# Patient Record
Sex: Male | Born: 1945
Health system: Southern US, Community
[De-identification: ages and names within clinical notes are randomized; demographics above are authoritative.]

## PROBLEM LIST (undated history)

## (undated) DIAGNOSIS — N189 Chronic kidney disease, unspecified: Secondary | ICD-10-CM

## (undated) DIAGNOSIS — I255 Ischemic cardiomyopathy: Secondary | ICD-10-CM

## (undated) DIAGNOSIS — E871 Hypo-osmolality and hyponatremia: Secondary | ICD-10-CM

## (undated) DIAGNOSIS — T7840XA Allergy, unspecified, initial encounter: Secondary | ICD-10-CM

## (undated) DIAGNOSIS — K219 Gastro-esophageal reflux disease without esophagitis: Secondary | ICD-10-CM

## (undated) DIAGNOSIS — G40909 Epilepsy, unspecified, not intractable, without status epilepticus: Secondary | ICD-10-CM

## (undated) DIAGNOSIS — Z800 Family history of malignant neoplasm of digestive organs: Secondary | ICD-10-CM

## (undated) DIAGNOSIS — I472 Ventricular tachycardia: Secondary | ICD-10-CM

## (undated) DIAGNOSIS — N289 Disorder of kidney and ureter, unspecified: Secondary | ICD-10-CM

## (undated) DIAGNOSIS — J703 Chronic drug-induced interstitial lung disorders: Secondary | ICD-10-CM

## (undated) DIAGNOSIS — Z95810 Presence of automatic (implantable) cardiac defibrillator: Secondary | ICD-10-CM

## (undated) DIAGNOSIS — I509 Heart failure, unspecified: Secondary | ICD-10-CM

## (undated) DIAGNOSIS — T82198A Other mechanical complication of other cardiac electronic device, initial encounter: Secondary | ICD-10-CM

## (undated) DIAGNOSIS — R51 Headache: Secondary | ICD-10-CM

## (undated) DIAGNOSIS — J449 Chronic obstructive pulmonary disease, unspecified: Secondary | ICD-10-CM

## (undated) DIAGNOSIS — I10 Essential (primary) hypertension: Secondary | ICD-10-CM

## (undated) DIAGNOSIS — I4892 Unspecified atrial flutter: Secondary | ICD-10-CM

## (undated) DIAGNOSIS — K760 Fatty (change of) liver, not elsewhere classified: Secondary | ICD-10-CM

## (undated) DIAGNOSIS — I447 Left bundle-branch block, unspecified: Secondary | ICD-10-CM

## (undated) DIAGNOSIS — E785 Hyperlipidemia, unspecified: Secondary | ICD-10-CM

## (undated) DIAGNOSIS — I2510 Atherosclerotic heart disease of native coronary artery without angina pectoris: Secondary | ICD-10-CM

## (undated) DIAGNOSIS — I4729 Other ventricular tachycardia: Secondary | ICD-10-CM

## (undated) DIAGNOSIS — J45909 Unspecified asthma, uncomplicated: Secondary | ICD-10-CM

## (undated) HISTORY — DX: Fatty (change of) liver, not elsewhere classified: K76.0

## (undated) HISTORY — PX: CORONARY ANGIOPLASTY: SHX604

## (undated) HISTORY — DX: Disorder of kidney and ureter, unspecified: N28.9

## (undated) HISTORY — DX: Allergy, unspecified, initial encounter: T78.40XA

## (undated) HISTORY — DX: Epilepsy, unspecified, not intractable, without status epilepticus: G40.909

## (undated) HISTORY — DX: Left bundle-branch block, unspecified: I44.7

## (undated) HISTORY — PX: INSERT / REPLACE / REMOVE PACEMAKER: SUR710

## (undated) HISTORY — DX: Unspecified atrial flutter: I48.92

## (undated) HISTORY — DX: Chronic drug-induced interstitial lung disorders: J70.3

## (undated) HISTORY — DX: Family history of malignant neoplasm of digestive organs: Z80.0

## (undated) HISTORY — DX: Essential (primary) hypertension: I10

## (undated) HISTORY — PX: HERNIA REPAIR: SHX51

## (undated) HISTORY — DX: Hyperlipidemia, unspecified: E78.5

## (undated) HISTORY — DX: Other mechanical complication of other cardiac electronic device, initial encounter: T82.198A

## (undated) HISTORY — DX: Presence of automatic (implantable) cardiac defibrillator: Z95.810

## (undated) HISTORY — DX: Ventricular tachycardia: I47.2

## (undated) HISTORY — DX: Ischemic cardiomyopathy: I25.5

## (undated) HISTORY — DX: Other ventricular tachycardia: I47.29

## (undated) HISTORY — DX: Hypo-osmolality and hyponatremia: E87.1

## (undated) HISTORY — DX: Chronic kidney disease, unspecified: N18.9

## (undated) HISTORY — DX: Atherosclerotic heart disease of native coronary artery without angina pectoris: I25.10

---

## 1999-09-19 ENCOUNTER — Ambulatory Visit (HOSPITAL_COMMUNITY): Admission: RE | Admit: 1999-09-19 | Discharge: 1999-09-19 | Payer: Self-pay | Admitting: *Deleted

## 2002-05-26 ENCOUNTER — Encounter: Payer: Self-pay | Admitting: *Deleted

## 2002-05-26 ENCOUNTER — Ambulatory Visit (HOSPITAL_COMMUNITY): Admission: RE | Admit: 2002-05-26 | Discharge: 2002-05-27 | Payer: Self-pay | Admitting: *Deleted

## 2002-12-08 ENCOUNTER — Ambulatory Visit (HOSPITAL_COMMUNITY): Admission: RE | Admit: 2002-12-08 | Discharge: 2002-12-08 | Payer: Self-pay | Admitting: General Surgery

## 2002-12-19 ENCOUNTER — Ambulatory Visit (HOSPITAL_COMMUNITY): Admission: RE | Admit: 2002-12-19 | Discharge: 2002-12-19 | Payer: Self-pay | Admitting: General Surgery

## 2003-01-03 ENCOUNTER — Encounter: Payer: Self-pay | Admitting: Emergency Medicine

## 2003-01-03 ENCOUNTER — Emergency Department (HOSPITAL_COMMUNITY): Admission: EM | Admit: 2003-01-03 | Discharge: 2003-01-04 | Payer: Self-pay | Admitting: Emergency Medicine

## 2003-06-21 ENCOUNTER — Inpatient Hospital Stay (HOSPITAL_COMMUNITY): Admission: EM | Admit: 2003-06-21 | Discharge: 2003-06-22 | Payer: Self-pay | Admitting: *Deleted

## 2003-06-22 ENCOUNTER — Encounter: Payer: Self-pay | Admitting: *Deleted

## 2003-10-27 ENCOUNTER — Inpatient Hospital Stay (HOSPITAL_COMMUNITY): Admission: EM | Admit: 2003-10-27 | Discharge: 2003-10-28 | Payer: Self-pay | Admitting: Emergency Medicine

## 2005-09-26 ENCOUNTER — Ambulatory Visit: Payer: Self-pay | Admitting: Cardiology

## 2006-04-03 ENCOUNTER — Ambulatory Visit: Payer: Self-pay | Admitting: Pulmonary Disease

## 2006-04-07 ENCOUNTER — Ambulatory Visit: Payer: Self-pay | Admitting: Cardiology

## 2006-04-09 ENCOUNTER — Ambulatory Visit: Payer: Self-pay | Admitting: Pulmonary Disease

## 2006-04-17 ENCOUNTER — Ambulatory Visit: Admission: RE | Admit: 2006-04-17 | Discharge: 2006-04-17 | Payer: Self-pay | Admitting: Pulmonary Disease

## 2006-04-17 ENCOUNTER — Ambulatory Visit: Payer: Self-pay | Admitting: Pulmonary Disease

## 2006-04-23 ENCOUNTER — Ambulatory Visit: Payer: Self-pay | Admitting: Pulmonary Disease

## 2006-06-03 ENCOUNTER — Inpatient Hospital Stay (HOSPITAL_COMMUNITY): Admission: EM | Admit: 2006-06-03 | Discharge: 2006-06-10 | Payer: Self-pay | Admitting: Emergency Medicine

## 2006-06-03 ENCOUNTER — Ambulatory Visit: Payer: Self-pay | Admitting: Cardiology

## 2006-06-04 ENCOUNTER — Encounter: Payer: Self-pay | Admitting: Internal Medicine

## 2006-06-22 ENCOUNTER — Ambulatory Visit: Payer: Self-pay | Admitting: Cardiology

## 2006-06-22 ENCOUNTER — Ambulatory Visit: Payer: Self-pay | Admitting: Internal Medicine

## 2006-06-29 ENCOUNTER — Ambulatory Visit: Payer: Self-pay | Admitting: Internal Medicine

## 2006-06-29 ENCOUNTER — Inpatient Hospital Stay (HOSPITAL_COMMUNITY): Admission: RE | Admit: 2006-06-29 | Discharge: 2006-06-30 | Payer: Self-pay | Admitting: Internal Medicine

## 2006-07-21 ENCOUNTER — Encounter: Payer: Self-pay | Admitting: Cardiovascular Disease

## 2006-07-21 ENCOUNTER — Ambulatory Visit: Payer: Self-pay

## 2006-07-22 ENCOUNTER — Ambulatory Visit: Payer: Self-pay | Admitting: Internal Medicine

## 2006-07-22 ENCOUNTER — Ambulatory Visit: Payer: Self-pay | Admitting: *Deleted

## 2006-08-06 ENCOUNTER — Encounter: Payer: Self-pay | Admitting: Cardiology

## 2006-08-06 ENCOUNTER — Ambulatory Visit: Payer: Self-pay

## 2006-08-12 ENCOUNTER — Ambulatory Visit: Payer: Self-pay | Admitting: Internal Medicine

## 2006-08-19 ENCOUNTER — Ambulatory Visit: Payer: Self-pay | Admitting: Internal Medicine

## 2006-08-26 ENCOUNTER — Ambulatory Visit (HOSPITAL_COMMUNITY): Admission: RE | Admit: 2006-08-26 | Discharge: 2006-08-27 | Payer: Self-pay | Admitting: Internal Medicine

## 2006-08-26 ENCOUNTER — Ambulatory Visit: Payer: Self-pay | Admitting: Internal Medicine

## 2006-09-10 ENCOUNTER — Ambulatory Visit: Payer: Self-pay | Admitting: Cardiology

## 2006-10-15 ENCOUNTER — Ambulatory Visit: Payer: Self-pay | Admitting: Internal Medicine

## 2006-10-30 ENCOUNTER — Ambulatory Visit: Payer: Self-pay | Admitting: Cardiology

## 2006-12-29 ENCOUNTER — Ambulatory Visit: Payer: Self-pay | Admitting: Internal Medicine

## 2007-01-19 ENCOUNTER — Ambulatory Visit: Payer: Self-pay | Admitting: Cardiology

## 2007-01-19 LAB — CONVERTED CEMR LAB
BUN: 13 mg/dL (ref 6–23)
CO2: 29 meq/L (ref 19–32)
Calcium: 8.9 mg/dL (ref 8.4–10.5)
Chloride: 100 meq/L (ref 96–112)
Creatinine, Ser: 1.2 mg/dL (ref 0.4–1.5)
GFR calc Af Amer: 79 mL/min
GFR calc non Af Amer: 65 mL/min
Glucose, Bld: 99 mg/dL (ref 70–99)
Potassium: 4.8 meq/L (ref 3.5–5.1)
Sodium: 134 meq/L — ABNORMAL LOW (ref 135–145)

## 2007-03-23 ENCOUNTER — Ambulatory Visit: Payer: Self-pay | Admitting: Internal Medicine

## 2007-06-22 ENCOUNTER — Ambulatory Visit: Payer: Self-pay | Admitting: Internal Medicine

## 2007-07-01 ENCOUNTER — Ambulatory Visit: Payer: Self-pay | Admitting: Internal Medicine

## 2007-07-27 ENCOUNTER — Ambulatory Visit: Payer: Self-pay | Admitting: Cardiology

## 2007-09-14 ENCOUNTER — Ambulatory Visit: Payer: Self-pay | Admitting: Internal Medicine

## 2007-09-14 LAB — CONVERTED CEMR LAB: Pro B Natriuretic peptide (BNP): 199 pg/mL — ABNORMAL HIGH (ref 0.0–100.0)

## 2007-12-14 ENCOUNTER — Ambulatory Visit: Payer: Self-pay | Admitting: Internal Medicine

## 2008-02-01 ENCOUNTER — Ambulatory Visit: Payer: Self-pay | Admitting: Cardiology

## 2008-03-21 ENCOUNTER — Ambulatory Visit: Payer: Self-pay | Admitting: Internal Medicine

## 2008-06-21 ENCOUNTER — Ambulatory Visit: Payer: Self-pay | Admitting: Internal Medicine

## 2008-07-12 ENCOUNTER — Inpatient Hospital Stay (HOSPITAL_COMMUNITY): Admission: EM | Admit: 2008-07-12 | Discharge: 2008-07-15 | Payer: Self-pay | Admitting: Cardiology

## 2008-07-12 ENCOUNTER — Ambulatory Visit: Payer: Self-pay | Admitting: Internal Medicine

## 2008-07-12 ENCOUNTER — Ambulatory Visit: Payer: Self-pay | Admitting: Pulmonary Disease

## 2008-07-12 ENCOUNTER — Encounter: Payer: Self-pay | Admitting: Internal Medicine

## 2008-07-26 ENCOUNTER — Ambulatory Visit: Payer: Self-pay | Admitting: Cardiology

## 2008-07-31 ENCOUNTER — Ambulatory Visit: Payer: Self-pay | Admitting: Cardiology

## 2008-08-22 ENCOUNTER — Ambulatory Visit: Payer: Self-pay | Admitting: Internal Medicine

## 2008-08-22 LAB — CONVERTED CEMR LAB: Magnesium: 2.7 mg/dL — ABNORMAL HIGH (ref 1.5–2.5)

## 2008-09-18 ENCOUNTER — Ambulatory Visit: Payer: Self-pay | Admitting: Cardiology

## 2008-11-21 ENCOUNTER — Ambulatory Visit: Payer: Self-pay | Admitting: Internal Medicine

## 2009-01-04 ENCOUNTER — Encounter: Payer: Self-pay | Admitting: Internal Medicine

## 2009-01-30 DIAGNOSIS — N259 Disorder resulting from impaired renal tubular function, unspecified: Secondary | ICD-10-CM

## 2009-01-30 DIAGNOSIS — I447 Left bundle-branch block, unspecified: Secondary | ICD-10-CM | POA: Insufficient documentation

## 2009-01-30 DIAGNOSIS — I4892 Unspecified atrial flutter: Secondary | ICD-10-CM

## 2009-01-31 ENCOUNTER — Encounter: Payer: Self-pay | Admitting: Cardiology

## 2009-01-31 ENCOUNTER — Ambulatory Visit: Payer: Self-pay | Admitting: Cardiology

## 2009-01-31 DIAGNOSIS — I480 Paroxysmal atrial fibrillation: Secondary | ICD-10-CM

## 2009-02-20 ENCOUNTER — Ambulatory Visit: Payer: Self-pay | Admitting: Internal Medicine

## 2009-03-02 ENCOUNTER — Telehealth: Payer: Self-pay | Admitting: Internal Medicine

## 2009-03-13 ENCOUNTER — Telehealth (INDEPENDENT_AMBULATORY_CARE_PROVIDER_SITE_OTHER): Payer: Self-pay | Admitting: *Deleted

## 2009-03-14 ENCOUNTER — Telehealth: Payer: Self-pay | Admitting: Internal Medicine

## 2009-03-14 ENCOUNTER — Encounter (INDEPENDENT_AMBULATORY_CARE_PROVIDER_SITE_OTHER): Payer: Self-pay | Admitting: *Deleted

## 2009-04-06 ENCOUNTER — Encounter: Payer: Self-pay | Admitting: Internal Medicine

## 2009-04-06 LAB — CONVERTED CEMR LAB
INR: 2.1 — ABNORMAL HIGH (ref 0.0–1.5)
Prothrombin Time: 24.7 s — ABNORMAL HIGH (ref 11.6–15.2)

## 2009-04-19 ENCOUNTER — Encounter: Payer: Self-pay | Admitting: Cardiology

## 2009-04-20 ENCOUNTER — Telehealth: Payer: Self-pay | Admitting: Cardiology

## 2009-04-26 ENCOUNTER — Ambulatory Visit: Payer: Self-pay | Admitting: Cardiology

## 2009-04-27 ENCOUNTER — Telehealth: Payer: Self-pay | Admitting: Cardiology

## 2009-04-27 ENCOUNTER — Encounter: Payer: Self-pay | Admitting: Cardiology

## 2009-04-30 ENCOUNTER — Ambulatory Visit: Payer: Self-pay | Admitting: Cardiology

## 2009-04-30 ENCOUNTER — Inpatient Hospital Stay (HOSPITAL_COMMUNITY): Admission: AD | Admit: 2009-04-30 | Discharge: 2009-05-02 | Payer: Self-pay | Admitting: Cardiology

## 2009-04-30 DIAGNOSIS — R05 Cough: Secondary | ICD-10-CM

## 2009-04-30 LAB — CONVERTED CEMR LAB
BUN: 26 mg/dL — ABNORMAL HIGH (ref 6–23)
CO2: 23 meq/L (ref 19–32)
Calcium: 8.7 mg/dL (ref 8.4–10.5)
Chloride: 87 meq/L — ABNORMAL LOW (ref 96–112)
Creatinine, Ser: 1.24 mg/dL (ref 0.40–1.50)
Glucose, Bld: 92 mg/dL (ref 70–99)
Potassium: 5.1 meq/L (ref 3.5–5.3)
Sodium: 122 meq/L — ABNORMAL LOW (ref 135–145)

## 2009-05-14 ENCOUNTER — Ambulatory Visit: Payer: Self-pay | Admitting: Cardiology

## 2009-05-14 DIAGNOSIS — D649 Anemia, unspecified: Secondary | ICD-10-CM

## 2009-05-14 LAB — CONVERTED CEMR LAB
BUN: 16 mg/dL (ref 6–23)
Basophils Absolute: 0 10*3/uL (ref 0.0–0.1)
Basophils Relative: 0.6 % (ref 0.0–3.0)
CO2: 29 meq/L (ref 19–32)
Calcium: 8.8 mg/dL (ref 8.4–10.5)
Chloride: 104 meq/L (ref 96–112)
Creatinine, Ser: 1 mg/dL (ref 0.4–1.5)
Eosinophils Absolute: 0 10*3/uL (ref 0.0–0.7)
Eosinophils Relative: 0.5 % (ref 0.0–5.0)
GFR calc non Af Amer: 79.82 mL/min (ref >60)
Glucose, Bld: 90 mg/dL (ref 70–99)
HCT: 34.9 % — ABNORMAL LOW (ref 39.0–52.0)
Hemoglobin: 11.8 g/dL — ABNORMAL LOW (ref 13.0–17.0)
Lymphocytes Relative: 26.5 % (ref 12.0–46.0)
Lymphs Abs: 1.2 10*3/uL (ref 0.7–4.0)
MCHC: 33.7 g/dL (ref 30.0–36.0)
MCV: 103.9 fL — ABNORMAL HIGH (ref 78.0–100.0)
Monocytes Absolute: 0.5 10*3/uL (ref 0.1–1.0)
Monocytes Relative: 10.6 % (ref 3.0–12.0)
Neutro Abs: 2.9 10*3/uL (ref 1.4–7.7)
Neutrophils Relative %: 61.8 % (ref 43.0–77.0)
Platelets: 195 10*3/uL (ref 150.0–400.0)
Potassium: 4.4 meq/L (ref 3.5–5.1)
RBC: 3.36 M/uL — ABNORMAL LOW (ref 4.22–5.81)
RDW: 13.1 % (ref 11.5–14.6)
Sodium: 138 meq/L (ref 135–145)
WBC: 4.6 10*3/uL (ref 4.5–10.5)

## 2009-05-16 ENCOUNTER — Telehealth: Payer: Self-pay | Admitting: Cardiology

## 2009-05-17 ENCOUNTER — Telehealth: Payer: Self-pay | Admitting: Cardiology

## 2009-05-22 ENCOUNTER — Ambulatory Visit: Payer: Self-pay | Admitting: Internal Medicine

## 2009-05-23 ENCOUNTER — Encounter: Payer: Self-pay | Admitting: Internal Medicine

## 2009-05-23 ENCOUNTER — Encounter: Payer: Self-pay | Admitting: Cardiology

## 2009-05-28 ENCOUNTER — Telehealth (INDEPENDENT_AMBULATORY_CARE_PROVIDER_SITE_OTHER): Payer: Self-pay | Admitting: *Deleted

## 2009-06-04 ENCOUNTER — Encounter: Payer: Self-pay | Admitting: Internal Medicine

## 2009-06-06 ENCOUNTER — Telehealth: Payer: Self-pay | Admitting: Cardiology

## 2009-06-07 ENCOUNTER — Encounter: Payer: Self-pay | Admitting: Internal Medicine

## 2009-06-08 ENCOUNTER — Encounter: Payer: Self-pay | Admitting: Internal Medicine

## 2009-06-08 LAB — CONVERTED CEMR LAB
INR: 1.4 (ref 0.0–1.5)
Prothrombin Time: 17.5 s — ABNORMAL HIGH (ref 11.6–15.2)

## 2009-06-18 ENCOUNTER — Telehealth: Payer: Self-pay | Admitting: Cardiology

## 2009-06-22 ENCOUNTER — Encounter: Payer: Self-pay | Admitting: Cardiology

## 2009-06-25 ENCOUNTER — Encounter: Payer: Self-pay | Admitting: Cardiology

## 2009-06-26 ENCOUNTER — Encounter: Payer: Self-pay | Admitting: Cardiology

## 2009-07-01 LAB — CONVERTED CEMR LAB
BUN: 17 mg/dL (ref 6–23)
CO2: 25 meq/L (ref 19–32)
Calcium: 8.9 mg/dL (ref 8.4–10.5)
Chloride: 102 meq/L (ref 96–112)
Creatinine, Ser: 1.13 mg/dL (ref 0.40–1.50)
Glucose, Bld: 100 mg/dL — ABNORMAL HIGH (ref 70–99)
Potassium: 4.7 meq/L (ref 3.5–5.3)
Sodium: 139 meq/L (ref 135–145)

## 2009-07-27 ENCOUNTER — Encounter (INDEPENDENT_AMBULATORY_CARE_PROVIDER_SITE_OTHER): Payer: Self-pay | Admitting: Cardiology

## 2009-07-28 ENCOUNTER — Encounter: Payer: Self-pay | Admitting: Internal Medicine

## 2009-07-30 LAB — CONVERTED CEMR LAB
INR: 1.3 (ref 0.0–1.5)
Prothrombin Time: 15.8 s — ABNORMAL HIGH (ref 11.6–15.2)

## 2009-08-23 ENCOUNTER — Telehealth (INDEPENDENT_AMBULATORY_CARE_PROVIDER_SITE_OTHER): Payer: Self-pay | Admitting: *Deleted

## 2009-09-03 ENCOUNTER — Encounter: Payer: Self-pay | Admitting: Internal Medicine

## 2009-09-04 ENCOUNTER — Ambulatory Visit: Payer: Self-pay | Admitting: Internal Medicine

## 2009-09-04 ENCOUNTER — Encounter: Payer: Self-pay | Admitting: Internal Medicine

## 2009-09-04 DIAGNOSIS — I5023 Acute on chronic systolic (congestive) heart failure: Secondary | ICD-10-CM

## 2009-09-07 LAB — CONVERTED CEMR LAB
BUN: 15 mg/dL (ref 6–23)
CO2: 29 meq/L (ref 19–32)
Calcium: 9.2 mg/dL (ref 8.4–10.5)
Chloride: 98 meq/L (ref 96–112)
Creatinine, Ser: 1.1 mg/dL (ref 0.4–1.5)
GFR calc non Af Amer: 71.44 mL/min (ref >60)
Glucose, Bld: 98 mg/dL (ref 70–99)
INR: 1.3 — ABNORMAL HIGH (ref 0.8–1.0)
Potassium: 4.3 meq/L (ref 3.5–5.1)
Pro B Natriuretic peptide (BNP): 177 pg/mL — ABNORMAL HIGH (ref 0.0–100.0)
Prothrombin Time: 13.8 s — ABNORMAL HIGH (ref 9.1–11.7)
Sodium: 136 meq/L (ref 135–145)

## 2009-09-10 ENCOUNTER — Telehealth: Payer: Self-pay | Admitting: Internal Medicine

## 2009-09-11 ENCOUNTER — Ambulatory Visit: Payer: Self-pay | Admitting: Cardiology

## 2009-10-02 ENCOUNTER — Encounter: Payer: Self-pay | Admitting: Internal Medicine

## 2009-10-03 LAB — CONVERTED CEMR LAB
INR: 1.41 (ref <1.50)
Prothrombin Time: 17.1 s — ABNORMAL HIGH (ref 11.6–15.2)

## 2009-10-15 ENCOUNTER — Telehealth (INDEPENDENT_AMBULATORY_CARE_PROVIDER_SITE_OTHER): Payer: Self-pay | Admitting: *Deleted

## 2009-10-16 ENCOUNTER — Telehealth: Payer: Self-pay | Admitting: Internal Medicine

## 2009-10-22 ENCOUNTER — Encounter (INDEPENDENT_AMBULATORY_CARE_PROVIDER_SITE_OTHER): Payer: Self-pay | Admitting: Cardiology

## 2009-10-22 ENCOUNTER — Ambulatory Visit: Payer: Self-pay | Admitting: Cardiology

## 2009-10-22 LAB — CONVERTED CEMR LAB: POC INR: 1.3

## 2009-11-06 ENCOUNTER — Ambulatory Visit: Payer: Self-pay | Admitting: Internal Medicine

## 2009-11-06 ENCOUNTER — Encounter (INDEPENDENT_AMBULATORY_CARE_PROVIDER_SITE_OTHER): Payer: Self-pay | Admitting: Cardiology

## 2009-11-06 LAB — CONVERTED CEMR LAB: POC INR: 1.6

## 2009-11-20 ENCOUNTER — Encounter (INDEPENDENT_AMBULATORY_CARE_PROVIDER_SITE_OTHER): Payer: Self-pay | Admitting: Cardiology

## 2009-11-20 ENCOUNTER — Ambulatory Visit: Payer: Self-pay | Admitting: Cardiology

## 2009-11-20 LAB — CONVERTED CEMR LAB: POC INR: 2.1

## 2009-11-29 ENCOUNTER — Telehealth: Payer: Self-pay | Admitting: Cardiology

## 2009-12-04 ENCOUNTER — Ambulatory Visit: Payer: Self-pay | Admitting: Internal Medicine

## 2009-12-10 ENCOUNTER — Ambulatory Visit: Payer: Self-pay | Admitting: Cardiology

## 2009-12-10 LAB — CONVERTED CEMR LAB: POC INR: 2.7

## 2009-12-12 ENCOUNTER — Encounter: Payer: Self-pay | Admitting: Internal Medicine

## 2010-01-02 ENCOUNTER — Ambulatory Visit: Payer: Self-pay | Admitting: Internal Medicine

## 2010-01-02 LAB — CONVERTED CEMR LAB: POC INR: 1.6

## 2010-01-03 ENCOUNTER — Telehealth: Payer: Self-pay | Admitting: Cardiology

## 2010-01-08 ENCOUNTER — Ambulatory Visit: Payer: Self-pay | Admitting: Cardiology

## 2010-01-17 ENCOUNTER — Encounter: Payer: Self-pay | Admitting: Internal Medicine

## 2010-01-23 ENCOUNTER — Ambulatory Visit: Payer: Self-pay | Admitting: Cardiovascular Disease

## 2010-01-23 LAB — CONVERTED CEMR LAB: POC INR: 2.1

## 2010-02-20 ENCOUNTER — Ambulatory Visit: Payer: Self-pay | Admitting: Cardiology

## 2010-03-12 ENCOUNTER — Ambulatory Visit: Payer: Self-pay | Admitting: Internal Medicine

## 2010-03-12 ENCOUNTER — Ambulatory Visit: Payer: Self-pay | Admitting: Cardiovascular Disease

## 2010-03-12 LAB — CONVERTED CEMR LAB: POC INR: 2.2

## 2010-04-09 ENCOUNTER — Ambulatory Visit: Payer: Self-pay | Admitting: Internal Medicine

## 2010-04-09 LAB — CONVERTED CEMR LAB: POC INR: 2.2

## 2010-05-06 ENCOUNTER — Ambulatory Visit: Payer: Self-pay | Admitting: Cardiology

## 2010-05-06 LAB — CONVERTED CEMR LAB: POC INR: 2.9

## 2010-05-08 ENCOUNTER — Encounter: Payer: Self-pay | Admitting: Cardiology

## 2010-05-08 ENCOUNTER — Telehealth: Payer: Self-pay | Admitting: Cardiovascular Disease

## 2010-05-22 ENCOUNTER — Encounter: Payer: Self-pay | Admitting: Cardiology

## 2010-06-04 ENCOUNTER — Ambulatory Visit: Payer: Self-pay | Admitting: Internal Medicine

## 2010-06-04 LAB — CONVERTED CEMR LAB: POC INR: 2.4

## 2010-06-13 ENCOUNTER — Ambulatory Visit: Payer: Self-pay | Admitting: Internal Medicine

## 2010-07-01 ENCOUNTER — Ambulatory Visit: Payer: Self-pay | Admitting: Cardiology

## 2010-07-01 LAB — CONVERTED CEMR LAB: POC INR: 2

## 2010-07-18 ENCOUNTER — Encounter: Payer: Self-pay | Admitting: Internal Medicine

## 2010-07-29 ENCOUNTER — Encounter: Payer: Self-pay | Admitting: Internal Medicine

## 2010-07-30 ENCOUNTER — Ambulatory Visit: Payer: Self-pay | Admitting: Cardiovascular Disease

## 2010-07-30 LAB — CONVERTED CEMR LAB: POC INR: 1.9

## 2010-08-06 ENCOUNTER — Telehealth: Payer: Self-pay | Admitting: Internal Medicine

## 2010-09-03 ENCOUNTER — Ambulatory Visit: Payer: Self-pay | Admitting: Cardiology

## 2010-09-03 ENCOUNTER — Encounter: Payer: Self-pay | Admitting: Cardiology

## 2010-09-03 ENCOUNTER — Encounter: Payer: Self-pay | Admitting: Internal Medicine

## 2010-09-03 LAB — CONVERTED CEMR LAB: POC INR: 2

## 2010-09-13 ENCOUNTER — Encounter: Payer: Self-pay | Admitting: Cardiology

## 2010-09-13 LAB — CONVERTED CEMR LAB
ALT: 16 units/L (ref 0–53)
AST: 20 units/L (ref 0–37)
Albumin: 3.5 g/dL (ref 3.5–5.2)
Alkaline Phosphatase: 84 units/L (ref 39–117)
BUN: 18 mg/dL (ref 6–23)
Bilirubin, Direct: 0.2 mg/dL (ref 0.0–0.3)
CO2: 25 meq/L (ref 19–32)
Calcium: 8.5 mg/dL (ref 8.4–10.5)
Chloride: 101 meq/L (ref 96–112)
Cholesterol: 169 mg/dL (ref 0–200)
Creatinine, Ser: 1.05 mg/dL (ref 0.40–1.50)
Glucose, Bld: 95 mg/dL (ref 70–99)
HCT: 37.5 % — ABNORMAL LOW (ref 39.0–52.0)
HDL: 29 mg/dL — ABNORMAL LOW (ref >39)
Hemoglobin: 12.3 g/dL — ABNORMAL LOW (ref 13.0–17.0)
Indirect Bilirubin: 0.8 mg/dL (ref 0.0–0.9)
LDL Cholesterol: 125 mg/dL — ABNORMAL HIGH (ref 0–99)
MCHC: 32.8 g/dL (ref 30.0–36.0)
MCV: 100 fL (ref 78.0–100.0)
Platelets: 262 10*3/uL (ref 150–400)
Potassium: 4.4 meq/L (ref 3.5–5.3)
RBC: 3.75 M/uL — ABNORMAL LOW (ref 4.22–5.81)
RDW: 13.5 % (ref 11.5–15.5)
Sodium: 134 meq/L — ABNORMAL LOW (ref 135–145)
TSH: 1.512 microintl units/mL (ref 0.350–4.500)
Total Bilirubin: 1 mg/dL (ref 0.3–1.2)
Total CHOL/HDL Ratio: 5.8
Total Protein: 7.9 g/dL (ref 6.0–8.3)
Triglycerides: 77 mg/dL (ref <150)
VLDL: 15 mg/dL (ref 0–40)
WBC: 4.8 10*3/uL (ref 4.0–10.5)

## 2010-10-02 ENCOUNTER — Ambulatory Visit: Payer: Self-pay | Admitting: Cardiology

## 2010-10-02 LAB — CONVERTED CEMR LAB: POC INR: 1.9

## 2010-10-03 ENCOUNTER — Ambulatory Visit: Payer: Self-pay | Admitting: Internal Medicine

## 2010-10-18 ENCOUNTER — Encounter (INDEPENDENT_AMBULATORY_CARE_PROVIDER_SITE_OTHER): Payer: Self-pay | Admitting: *Deleted

## 2010-10-30 ENCOUNTER — Ambulatory Visit: Payer: Self-pay | Admitting: Cardiology

## 2010-11-27 ENCOUNTER — Ambulatory Visit: Admission: RE | Admit: 2010-11-27 | Discharge: 2010-11-27 | Payer: Self-pay | Source: Home / Self Care

## 2010-11-27 LAB — CONVERTED CEMR LAB: POC INR: 1.9

## 2010-12-15 LAB — CONVERTED CEMR LAB
BUN: 14 mg/dL (ref 6–23)
BUN: 30 mg/dL — ABNORMAL HIGH (ref 6–23)
Basophils Absolute: 0 10*3/uL (ref 0.0–0.1)
Basophils Relative: 0.2 % (ref 0.0–3.0)
CO2: 28 meq/L (ref 19–32)
CO2: 31 meq/L (ref 19–32)
Calcium: 8.3 mg/dL — ABNORMAL LOW (ref 8.4–10.5)
Calcium: 8.8 mg/dL (ref 8.4–10.5)
Chloride: 89 meq/L — ABNORMAL LOW (ref 96–112)
Chloride: 98 meq/L (ref 96–112)
Creatinine, Ser: 1 mg/dL (ref 0.4–1.5)
Creatinine, Ser: 1.3 mg/dL (ref 0.4–1.5)
Eosinophils Absolute: 0 10*3/uL (ref 0.0–0.7)
Eosinophils Relative: 0.4 % (ref 0.0–5.0)
GFR calc non Af Amer: 58.98 mL/min (ref >60)
GFR calc non Af Amer: 79.66 mL/min (ref >60)
Glucose, Bld: 110 mg/dL — ABNORMAL HIGH (ref 70–99)
Glucose, Bld: 89 mg/dL (ref 70–99)
HCT: 30.2 % — ABNORMAL LOW (ref 39.0–52.0)
Hemoglobin: 10.5 g/dL — ABNORMAL LOW (ref 13.0–17.0)
Lymphocytes Relative: 16.3 % (ref 12.0–46.0)
Lymphs Abs: 1.3 10*3/uL (ref 0.7–4.0)
MCHC: 34.8 g/dL (ref 30.0–36.0)
MCV: 101 fL — ABNORMAL HIGH (ref 78.0–100.0)
Monocytes Absolute: 1.1 10*3/uL — ABNORMAL HIGH (ref 0.1–1.0)
Monocytes Relative: 14.6 % — ABNORMAL HIGH (ref 3.0–12.0)
Neutro Abs: 5.3 10*3/uL (ref 1.4–7.7)
Neutrophils Relative %: 68.5 % (ref 43.0–77.0)
Platelets: 268 10*3/uL (ref 150.0–400.0)
Potassium: 4.2 meq/L (ref 3.5–5.1)
Potassium: 4.5 meq/L (ref 3.5–5.1)
Pro B Natriuretic peptide (BNP): 213 pg/mL — ABNORMAL HIGH (ref 0.0–100.0)
Pro B Natriuretic peptide (BNP): 257 pg/mL — ABNORMAL HIGH (ref 0.0–100.0)
RBC: 2.99 M/uL — ABNORMAL LOW (ref 4.22–5.81)
RDW: 12.5 % (ref 11.5–14.6)
Sodium: 123 meq/L — ABNORMAL LOW (ref 135–145)
Sodium: 135 meq/L (ref 135–145)
WBC: 7.7 10*3/uL (ref 4.5–10.5)

## 2010-12-17 NOTE — Letter (Signed)
Summary: Remote Device Check  [employer], Main Office  [address]   [place], [place] [address]   Phone: [phone]  Fax: [phone]     December 12, 2009 MRN: [id number]   JAKOLBY SEDIVY [address] [place], [place]  [address]   Dear Mr. MACPHERSON,   Your remote transmission was recieved and reviewed by your physician.  All diagnostics were within normal limits for you.    ___X___Your next office visit is scheduled for:  APRIL 2011 WITH DR Graciela Husbands. Please call our office to schedule an appointment.    Sincerely,  Proofreader

## 2010-12-17 NOTE — Letter (Signed)
Summary: Handout Printed  Printed Handout:  - Coumadin Instructions-w/out Meds 

## 2010-12-17 NOTE — Assessment & Plan Note (Signed)
Summary: PER CHECK OUT/SF   Visit Type:  Follow-up Primary Provider:  [facility]  CC:  CAD and Cardiomyopathy.  History of Present Illness: The patient presents for followup of the above. Since I last saw him he's done well. He has had occasional shortness of breath and occasionally takes extra 20 mg of Lasix. He denies any PND or orthopnea. He denies any chest pressure, neck or arm discomfort. Though he has had documented paroxysmal atrial fibrillation on his device he does not feel this. He has not had presyncope or syncope. Overall he thinks he is doing relatively well.  Current Medications (verified): 1)  Coreg Cr 80 Mg Xr24h-Cap (Carvedilol Phosphate) .... One By Mouth Daily 2)  Lisinopril 20 Mg Tabs (Lisinopril) .... One By Mouth Daily and Take Extra As Needed For Hypertension 3)  Furosemide 40 Mg Tabs (Furosemide) .... 2 Daily 4)  Digoxin 0.125 Mg Tabs (Digoxin) .... One By Mouth Every Other Day 5)  Warfarin Sodium 5 Mg Tabs (Warfarin Sodium) .... Use As Directed By Anticoagulation Clinic 6)  Nitroglycerin 0.4 Mg Subl (Nitroglycerin) .... One By Mouth As Needed 7)  Aspirin 81 Mg Tabs (Aspirin) .... One By Mouth As Needed 8)  Colchicine 0.6 Mg Tabs (Colchicine) .Marland Kitchen.. 1 By Mouth Daily/ As Needed 9)  Tramadol Hcl 50 Mg Tabs (Tramadol Hcl) .... One By Mouth As Needed 10)  Multivitamins   Tabs (Multiple Vitamin) .... Daily 11)  Fish Oil   Oil (Fish Oil) .... Daily 12)  Coq10 30 Mg Caps (Coenzyme Q10) .... Daily 13)  Vitamin D 1000 Unit  Tabs (Cholecalciferol) .... Daily 14)  Vitamin C 500 Mg  Tabs (Ascorbic Acid) .... Daily 15)  Green Tea 150 Mg Caps (Green Tea (Camillia Sinensis)) .... Daily 16)  Valerian Root 450 Mg Caps (Valerian) .... Daily 17)  Saw Palmetto 500 Mg Caps (Saw Palmetto (Serenoa Repens)) .... Daily 18)  Benadryl 25 Mg Tabs (Diphenhydramine Hcl) .... As Needed 19)  Calcium-Magnesium 500-250 Mg Tabs (Calcium-Magnesium) .Marland Kitchen.. 1 By Mouth Daily 20)  Klor-Con M20  20 Meq Cr-Tabs (Potassium Chloride Crys Cr) .Marland Kitchen.. 1 By Mouth Daily  Allergies (verified): 1)  ! * Codiene 2)  ! Flagyl 3)  ! Penicillin 4)  ! Prilosec 5)  ! Norvasc 6)  ! * Latex  Past History:  Past Medical History: Reviewed history from 09/11/2009 and no changes required. Ischemic/nonischemic cardiomyopathy (EF 25%) Atrial flutter status post TEE guided cardioversion Nonsustained ventricular tachycardia Chronic left bundle-branch block Chronic interstitial lung  disease Acute on chronic renal insufficiency Coronary artery disease  (total occlusion of the right coronary artery, left-to-right     collaterals.  He had Cypher stenting to the circumflex in July 2003),  Seizure disorder Multiple chemical allergies Medtronic cardioverter-defibrillator/CRT.  Hyponatremia  Past Surgical History: Reviewed history from 01/30/2009 and no changes required. ICD PLACEMENT  Review of Systems       As stated in the HPI and negative for all other systems.   Vital Signs:  Patient profile:   65 year old male Height:      70 inches Weight:      186 pounds BMI:     26.78 Pulse rate:   69 / minute Resp:     16 per minute BP sitting:   159 / 88  (right arm)  Vitals Entered By: Marrion Coy, CNA (September 03, 2010 10:53 AM)  Physical Exam  General:  Well developed, well nourished, in no acute distress. Head:  normocephalic  and atraumatic Neck:  Neck supple, no JVD. No masses, thyromegaly or abnormal cervical nodes. Lungs:  Clear bilaterally to auscultation and percussion. Abdomen:  Bowel sounds positive; abdomen soft and non-tender without masses, organomegaly, or hernias noted. No hepatosplenomegaly. Msk:  Back normal, normal gait. Muscle strength and tone normal. Extremities:  No clubbing or cyanosis. Neurologic:  Alert and oriented x 3. Skin:  Intact without lesions or rashes. Cervical Nodes:  no significant adenopathy Axillary Nodes:  no significant adenopathy Inguinal Nodes:   no significant adenopathy Psych:  Normal affect.   Detailed Cardiovascular Exam  Neck    Carotids: Carotids full and equal bilaterally without bruits.      Neck Veins: Normal, no JVD.    Heart    Inspection: no deformities or lifts noted.      Palpation: normal PMI with no thrills palpable.      Auscultation: regular rate and rhythm, S1, S2 without murmurs, rubs, gallops, or clicks.    Vascular    Abdominal Aorta: no palpable masses, pulsations, or audible bruits.      Femoral Pulses: normal femoral pulses bilaterally.      Pedal Pulses: normal pedal pulses bilaterally.      Radial Pulses: normal radial pulses bilaterally.      Peripheral Circulation: no clubbing, cyanosis, or edema noted with normal capillary refill.      ICD Specifications Following MD:  Sherryl Manges, MD     Referring MD:  [facility] ICD Vendor:  Medtronic     ICD Model Number:  7299     ICD Serial Number:  [id number] ICD DOI:  08/26/2006     ICD Implanting MD:  Sherryl Manges, MD  Lead 1:    Location: RA     DOI: 08/26/2006     Model #: 5076     Serial #: [id number]     Status: active Lead 2:    Location: RV     DOI: 08/26/2006     Model #: [id number]     Serial #: [id number]     Status: active Lead 3:    Location: LV     DOI: 08/26/2006     Model #: 8469     Serial #: [id number]     Status: active  Indications::  ICM, CHF   ICD Follow Up ICD Dependent:  No       ICD Device Measurements Configuration: LV TIP TO RV COIL  Brady Parameters Mode DDDR     Lower Rate Limit:  60     Upper Rate Limit 110 PAV 170     Sensed AV Delay:  150  Tachy Zones VF:  200     VT:  250 (FVT VIA VF)     VT1:  162     Impression & Recommendations:  Problem # 1:  SYSTOLIC HEART FAILURE, CHRONIC (ICD-428.22) Overall I think he is euvolemic and doing well. I will check some routine blood work as this has not been done since February. However, not going to titrate his meds at this point and he will continue on the meds as  listed.  Problem # 2:  IMPLANTABLE  DEFIBRILLATOR CRT- MDT (ICD-V45.02) He is approaching the need for battery replacement and will also be lead replacement.  Problem # 3:  FIBRILLATION, ATRIAL (ICD-427.31) He remains on anticoagulation and has no symptoms related to this. Orders: EKG w/ Interpretation (93000)  Problem # 4:  CAD, UNSPECIFIED SITE (ICD-414.00) He will continue with risk  He wants to avoid a statin at all costs.  Patient Instructions: 1)  Your physician recommends that you schedule a follow-up appointment in: 6 months with Dr Antoine Poche 2)  Your physician recommends that you have lab work in:  [facility].  You have been given a written RX for fasting lipid, hepatic panel, CBC, BMP and TSH 3)  Your physician recommends that you continue on your current medications as directed. Please refer to the Current Medication list given to you today.

## 2010-12-17 NOTE — Medication Information (Signed)
Summary: rov/tm  Anticoagulant Therapy  Managed by: Weston Brass, PharmD Referring MD: [name], MD PCP: Nadine Counts, MD Supervising MD: Myrtis Ser MD, Tinnie Gens Indication 1: Atrial Fibrillation Lab Used: [facility] [place] Site: [address] INR POC 2.0 INR RANGE 2-3  Dietary changes: no    Health status changes: no    Bleeding/hemorrhagic complications: no    Recent/future hospitalizations: no    Any changes in medication regimen? no    Recent/future dental: no  Any missed doses?: yes     Details: Missed dose on 08/24/10.    Is patient compliant with meds? yes       Allergies: 1)  ! * Codiene 2)  ! Flagyl 3)  ! Penicillin 4)  ! Prilosec 5)  ! Norvasc 6)  ! * Latex  Anticoagulation Management History:      The patient is taking warfarin and comes in today for a routine follow up visit.  Positive risk factors for bleeding include an age of 67 years or older.  The bleeding index is 'intermediate risk'.  Positive CHADS2 values include History of CHF.  Negative CHADS2 values include Age > 61 years old.  His last INR was 1.41.  Anticoagulation responsible provider: Myrtis Ser MD, Tinnie Gens.  INR POC: 2.0.  Cuvette Lot#: [id number].  Exp: 09/2011.    Anticoagulation Management Assessment/Plan:      The patient's current anticoagulation dose is Warfarin sodium 5 mg tabs: Use as directed by Anticoagulation Clinic.  The target INR is 2.0-3.0.  The next INR is due 10/02/2010.  Anticoagulation instructions were given to patient.  Results were reviewed/authorized by Weston Brass, PharmD.  He was notified by Haynes Hoehn, PharmD Candidate.         Prior Anticoagulation Instructions: INR 1.9 Today take 7.5mg s then resume 5mg s everyday except 7.5mg s on Mondays, Wednesdays and Fridays. Recheck in 3 weeks.   Current Anticoagulation Instructions: INR 2.0  Continue Coumadin as scheduled:  1 and 1/2 tablets every day of the week, except 1 tablet on Tuesday, Thurdsay, and Saturday.   Return to clinic in 4 weeks.

## 2010-12-17 NOTE — Letter (Signed)
Summary: Remote Device Check  [facility], Main Office  [address]   [place], [place] [address]   Phone: [phone]  Fax: [phone]     July 18, 2010 MRN: [id number]   [name] [address] [place], [place]  [address]   Dear [name],   Your remote transmission was recieved and reviewed by your physician.  All diagnostics were within normal limits for you.  __X___Your next transmission is scheduled for:  09-19-2010. Please transmit at any time this day.  If you have a wireless device your transmission will be sent automatically.    Sincerely,  Vella Kohler

## 2010-12-17 NOTE — Medication Information (Signed)
Summary: rov/tm  Anticoagulant Therapy  Managed by: Shelby Dubin, PharmD, BCPS, CPP Referring MD: [name], MD PCP: Nadine Counts, MD Supervising MD: Ladona Ridgel MD, Sharlot Gowda Indication 1: Atrial Fibrillation Lab Used: [facility] [place] Site: [address] INR POC 2.1 INR RANGE 2-3  Dietary changes: no    Health status changes: yes       Details: see below  Bleeding/hemorrhagic complications: no    Recent/future hospitalizations: no    Any changes in medication regimen? no    Recent/future dental: no  Any missed doses?: no       Is patient compliant with meds? yes      Comments: Episodes of muscle spasm in top of head, lasting up to 30 minutes, but not requiring any intervention to go away...no vision, speech, word finding, etc.    Current Medications (verified): 1)  Coreg Cr 80 Mg Xr24h-Cap (Carvedilol Phosphate) .... One By Mouth Daily 2)  Lisinopril 20 Mg Tabs (Lisinopril) .... One By Mouth Daily 3)  Furosemide 40 Mg Tabs (Furosemide) .... 2 Daily 4)  Digoxin 0.125 Mg Tabs (Digoxin) .... One By Mouth Every Other Day 5)  Warfarin Sodium 5 Mg Tabs (Warfarin Sodium) .... Use As Directed By Anticoagulation Clinic 6)  Nitroglycerin 0.4 Mg Subl (Nitroglycerin) .... One By Mouth As Needed 7)  Aspirin 81 Mg Tabs (Aspirin) .... One By Mouth As Needed 8)  Colchicine 0.6 Mg Tabs (Colchicine) .Marland Kitchen.. 1 By Mouth Daily/ As Needed 9)  Tramadol Hcl 50 Mg Tabs (Tramadol Hcl) .... One By Mouth As Needed 10)  Multivitamins   Tabs (Multiple Vitamin) .... Daily 11)  Fish Oil   Oil (Fish Oil) .... Daily 12)  Coq10 30 Mg Caps (Coenzyme Q10) .... Daily 13)  Vitamin D 1000 Unit  Tabs (Cholecalciferol) .... Daily 14)  Vitamin C 500 Mg  Tabs (Ascorbic Acid) .... Daily 15)  Green Tea 150 Mg Caps (Green Tea (Camillia Sinensis)) .... Daily 16)  Valerian Root 450 Mg Caps (Valerian) .... Daily 17)  Saw Palmetto 500 Mg Caps (Saw Palmetto (Serenoa Repens)) .... Daily 18)  Benadryl 25 Mg  Tabs (Diphenhydramine Hcl) .... As Needed  Allergies (verified): 1)  ! * Codiene 2)  ! Flagyl 3)  ! Penicillin 4)  ! Prilosec 5)  ! Norvasc 6)  ! * Latex  Anticoagulation Management History:      The patient is taking warfarin and comes in today for a routine follow up visit.  Positive risk factors for bleeding include an age of 56 years or older.  The bleeding index is 'intermediate risk'.  Positive CHADS2 values include History of CHF.  Negative CHADS2 values include Age > 9 years old.  His last INR was 1.41.  Anticoagulation responsible provider: Ladona Ridgel MD, Sharlot Gowda.  INR POC: 2.1.  Cuvette Lot#: [id number].  Exp: 12/2010.    Anticoagulation Management Assessment/Plan:      The patient's current anticoagulation dose is Warfarin sodium 5 mg tabs: Use as directed by Anticoagulation Clinic.  The next INR is due 12/18/2009.  Anticoagulation instructions were given to patient.  Results were reviewed/authorized by Shelby Dubin, PharmD, BCPS, CPP.  He was notified by Shelby Dubin PharmD, BCPS, CPP.         Prior Anticoagulation Instructions: INR = 1.6  Increase to 1 tab each Sunday and Thursday and 1.5 tabs on all other days.   Recheck in 2 weeks..  Current Anticoagulation Instructions: INR 2.1  Continue 1 tab each Sunday and Thursday and  1.5 tabs on all other days.  Recheck in 3 weeks.

## 2010-12-17 NOTE — Medication Information (Signed)
Summary: rov/sp  Anticoagulant Therapy  Managed by: Bethena Midget, RN, BSN Referring MD: [name], MD PCP: Nadine Counts, MD Supervising MD: Clifton James MD, Cristal Deer Indication 1: Atrial Fibrillation Lab Used: [facility]  Site: [address] INR POC 1.9 INR RANGE 2-3  Dietary changes: no    Health status changes: no    Bleeding/hemorrhagic complications: no    Recent/future hospitalizations: no    Any changes in medication regimen? no    Recent/future dental: no  Any missed doses?: no       Is patient compliant with meds? yes       Allergies: 1)  ! * Codiene 2)  ! Flagyl 3)  ! Penicillin 4)  ! Prilosec 5)  ! Norvasc 6)  ! * Latex  Anticoagulation Management History:      The patient is taking warfarin and comes in today for a routine follow up visit.  Positive risk factors for bleeding include an age of 67 years or older.  The bleeding index is 'intermediate risk'.  Positive CHADS2 values include History of CHF.  Negative CHADS2 values include Age > 71 years old.  His last INR was 1.41.  Anticoagulation responsible provider: Clifton James MD, Cristal Deer.  INR POC: 1.9.  Cuvette Lot#: [id number].  Exp: 09/2011.    Anticoagulation Management Assessment/Plan:      The patient's current anticoagulation dose is Warfarin sodium 5 mg tabs: Use as directed by Anticoagulation Clinic.  The target INR is 2.0-3.0.  The next INR is due 08/30/2010.  Anticoagulation instructions were given to patient.  Results were reviewed/authorized by Bethena Midget, RN, BSN.  He was notified by Bethena Midget, RN, BSN.         Prior Anticoagulation Instructions: INR 2.0  Continue 1 tablet daily except 1.5 tablets Mon, Wed and Fri.  Return to clinic in 4 weeks.  Current Anticoagulation Instructions: INR 1.9 Today take 7.5mg s then resume 5mg s everyday except 7.5mg s on Mondays, Wednesdays and Fridays. Recheck in 3 weeks.

## 2010-12-17 NOTE — Medication Information (Signed)
Summary: rov/cs  Anticoagulant Therapy  Managed by: Weston Brass, PharmD Referring MD: [name], MD PCP: [facility] Supervising MD: Myrtis Ser MD, Tinnie Gens Indication 1: Atrial Fibrillation Lab Used: [facility] [place] Site: [address] INR POC 1.9 INR RANGE 2-3  Dietary changes: no    Health status changes: no    Bleeding/hemorrhagic complications: no    Recent/future hospitalizations: no    Any changes in medication regimen? no    Recent/future dental: no  Any missed doses?: no       Is patient compliant with meds? yes      Comments: Pt refused an increase in dose 2/2 to concerns about bleeding. Educated on risk of being INR being below 2. Pt will continue same dose as before and will be boosted x1 today.  Allergies: 1)  ! * Codiene 2)  ! Flagyl 3)  ! Penicillin 4)  ! Prilosec 5)  ! Norvasc 6)  ! * Latex  Anticoagulation Management History:      The patient is taking warfarin and comes in today for a routine follow up visit.  Positive risk factors for bleeding include an age of 30 years or older.  The bleeding index is 'intermediate risk'.  Positive CHADS2 values include History of CHF.  Negative CHADS2 values include Age > 46 years old.  His last INR was 1.41.  Anticoagulation responsible provider: Myrtis Ser MD, Tinnie Gens.  INR POC: 1.9.  Cuvette Lot#: [id number].  Exp: 09/2011.    Anticoagulation Management Assessment/Plan:      The patient's current anticoagulation dose is Warfarin sodium 5 mg tabs: Use as directed by Anticoagulation Clinic.  The target INR is 2.0-3.0.  The next INR is due 10/30/2010.  Anticoagulation instructions were given to patient.  Results were reviewed/authorized by Weston Brass, PharmD.  He was notified by Hoy Register, PharmD Candidate.         Prior Anticoagulation Instructions: INR 2.0  Continue Coumadin as scheduled:  1 and 1/2 tablets every day of the week, except 1 tablet on Tuesday, Thurdsay, and Saturday.  Return to  clinic in 4 weeks.    Current Anticoagulation Instructions: INR 1.9 Take 2 tablets today, then resume previous dose of 1 tablet everyday except 1.5 tablets on Monday, Wednesday, and Friday Recheck INR in 4 weeks

## 2010-12-17 NOTE — Cardiovascular Report (Signed)
Summary: Office Visit Remote   Office Visit Remote   Imported By: Roderic Ovens 12/12/2009 12:34:00  _____________________________________________________________________  External Attachment:    Type:   Image     Comment:   External Document

## 2010-12-17 NOTE — Procedures (Signed)
Summary: Cardiology Device Clinic   Current Medications (verified): 1)  Coreg Cr 80 Mg Xr24h-Cap (Carvedilol Phosphate) .... One By Mouth Daily 2)  Lisinopril 20 Mg Tabs (Lisinopril) .... One By Mouth Daily and Take Extra As Needed For Hypertension 3)  Furosemide 40 Mg Tabs (Furosemide) .... 2 Daily 4)  Digoxin 0.125 Mg Tabs (Digoxin) .... One By Mouth Every Other Day 5)  Warfarin Sodium 5 Mg Tabs (Warfarin Sodium) .... Use As Directed By Anticoagulation Clinic 6)  Nitroglycerin 0.4 Mg Subl (Nitroglycerin) .... One By Mouth As Needed 7)  Aspirin 81 Mg Tabs (Aspirin) .... One By Mouth As Needed 8)  Colchicine 0.6 Mg Tabs (Colchicine) .Marland Kitchen.. 1 By Mouth Daily/ As Needed 9)  Tramadol Hcl 50 Mg Tabs (Tramadol Hcl) .... One By Mouth As Needed 10)  Multivitamins   Tabs (Multiple Vitamin) .... Daily 11)  Fish Oil   Oil (Fish Oil) .... Daily 12)  Coq10 30 Mg Caps (Coenzyme Q10) .... Daily 13)  Vitamin D 1000 Unit  Tabs (Cholecalciferol) .... Daily 14)  Vitamin C 500 Mg  Tabs (Ascorbic Acid) .... Daily 15)  Green Tea 150 Mg Caps (Green Tea (Camillia Sinensis)) .... Daily 16)  Valerian Root 450 Mg Caps (Valerian) .... Daily 17)  Saw Palmetto 500 Mg Caps (Saw Palmetto (Serenoa Repens)) .... Daily 18)  Benadryl 25 Mg Tabs (Diphenhydramine Hcl) .... As Needed 19)  Calcium-Magnesium 500-250 Mg Tabs (Calcium-Magnesium) .Marland Kitchen.. 1 By Mouth Daily 20)  Klor-Con M20 20 Meq Cr-Tabs (Potassium Chloride Crys Cr) .Marland Kitchen.. 1 By Mouth Daily  Allergies (verified): 1)  ! * Codiene 2)  ! Flagyl 3)  ! Penicillin 4)  ! Prilosec 5)  ! Norvasc 6)  ! * Latex   ICD Specifications Following MD:  Sherryl Manges, MD     Referring MD:  [facility] ICD Vendor:  [employer]     ICD Model Number:  7299     ICD Serial Number:  [id number] ICD DOI:  08/26/2006     ICD Implanting MD:  Sherryl Manges, MD  Lead 1:    Location: RA     DOI: 08/26/2006     Model #: 5076     Serial #: [id number]     Status: active Lead 2:    Location: RV     DOI:  08/26/2006     Model #: 6644     Serial #: [id number]     Status: active Lead 3:    Location: LV     DOI: 08/26/2006     Model #: 6387     Serial #: [id number]     Status: active  Indications::  ICM, CHF   ICD Follow Up Battery Voltage:  2.62 V     Charge Time:  10.68 seconds     Battery Est. Longevity:  3-6 mths Underlying rhythm:  SR ICD Dependent:  No       ICD Device Measurements Atrium:  Amplitude: 2.4 mV, Impedance: 448 ohms, Threshold: 1.0 V at 0.2 msec Right Ventricle:  Amplitude: 19.3 mV, Impedance: 568 ohms, Threshold: 1.0 V at 0.2 msec Left Ventricle:  Impedance: 480 ohms, Threshold: 1.0 V at 0.2 msec Configuration: LV TIP TO RV COIL Shock Impedance: 49/66 ohms   Episodes MS Episodes:  7     Shock:  0     ATP:  0     Nonsustained:  7     Atrial Therapies:  0 Atrial Pacing:  96.2%     Ventricular Pacing:  99.6%  Brady Parameters Mode DDDR     Lower Rate Limit:  60     Upper Rate Limit 110 PAV 170     Sensed AV Delay:  150  Tachy Zones VF:  200     VT:  250 (FVT VIA VF)     VT1:  162     Next Remote Date:  10/03/2010     Tech Comments:  3-6 MTHS BATTERY LONGEVITY PER SUE AT MDT.  MONTHLY CARELINK CHECKS.  NORMAL DEVICE FUNCTION.  1610 LEAD STABLE.  CHANGED MAX LEAD IMPEDANCE FOR LIA. [employer] TRANSMISSION 10-03-2010. Vella Kohler  September 03, 2010 12:34 PM

## 2010-12-17 NOTE — Medication Information (Signed)
Summary: Standing Order Request Form   Standing Order Request Form   Imported By: Roderic Ovens 08/01/2010 13:08:22  _____________________________________________________________________  External Attachment:    Type:   Image     Comment:   External Document

## 2010-12-17 NOTE — Medication Information (Signed)
Summary: rov/ewj  Anticoagulant Therapy  Managed by: Rolland Porter, PharmD Referring MD: [name], MD PCP: Nadine Counts, MD Supervising MD: Ladona Ridgel MD, Sharlot Gowda Indication 1: Atrial Fibrillation Lab Used: [facility] [place] Site: [address] INR POC 2.7 INR RANGE 2-3  Dietary changes: no    Health status changes: no    Bleeding/hemorrhagic complications: no    Recent/future hospitalizations: no    Any changes in medication regimen? no    Recent/future dental: no  Any missed doses?: no       Is patient compliant with meds? yes       Current Medications (verified): 1)  Coreg Cr 80 Mg Xr24h-Cap (Carvedilol Phosphate) .... One By Mouth Daily 2)  Lisinopril 20 Mg Tabs (Lisinopril) .... One By Mouth Daily 3)  Furosemide 40 Mg Tabs (Furosemide) .... 2 Daily 4)  Digoxin 0.125 Mg Tabs (Digoxin) .... One By Mouth Every Other Day 5)  Warfarin Sodium 5 Mg Tabs (Warfarin Sodium) .... Use As Directed By Anticoagulation Clinic 6)  Nitroglycerin 0.4 Mg Subl (Nitroglycerin) .... One By Mouth As Needed 7)  Aspirin 81 Mg Tabs (Aspirin) .... One By Mouth As Needed 8)  Colchicine 0.6 Mg Tabs (Colchicine) .Marland Kitchen.. 1 By Mouth Daily/ As Needed 9)  Tramadol Hcl 50 Mg Tabs (Tramadol Hcl) .... One By Mouth As Needed 10)  Multivitamins   Tabs (Multiple Vitamin) .... Daily 11)  Fish Oil   Oil (Fish Oil) .... Daily 12)  Coq10 30 Mg Caps (Coenzyme Q10) .... Daily 13)  Vitamin D 1000 Unit  Tabs (Cholecalciferol) .... Daily 14)  Vitamin C 500 Mg  Tabs (Ascorbic Acid) .... Daily 15)  Green Tea 150 Mg Caps (Green Tea (Camillia Sinensis)) .... Daily 16)  Valerian Root 450 Mg Caps (Valerian) .... Daily 17)  Saw Palmetto 500 Mg Caps (Saw Palmetto (Serenoa Repens)) .... Daily 18)  Benadryl 25 Mg Tabs (Diphenhydramine Hcl) .... As Needed  Allergies (verified): 1)  ! * Codiene 2)  ! Flagyl 3)  ! Penicillin 4)  ! Prilosec 5)  ! Norvasc 6)  ! * Latex  Anticoagulation Management  History:      Positive risk factors for bleeding include an age of 57 years or older.  The bleeding index is 'intermediate risk'.  Positive CHADS2 values include History of CHF.  Negative CHADS2 values include Age > 66 years old.  His last INR was 1.41.  Anticoagulation responsible provider: Ladona Ridgel MD, Sharlot Gowda.  INR POC: 2.7.  Cuvette Lot#: [id number].  Exp: 12/2010.    Anticoagulation Management Assessment/Plan:      The patient's current anticoagulation dose is Warfarin sodium 5 mg tabs: Use as directed by Anticoagulation Clinic.  The next INR is due 12/31/2009.  Anticoagulation instructions were given to patient.  Results were reviewed/authorized by Rolland Porter, PharmD.  He was notified by Rolland Porter.         Prior Anticoagulation Instructions: INR 2.1  Continue 1 tab each Sunday and Thursday and 1.5 tabs on all other days.  Recheck in 3 weeks.    Current Anticoagulation Instructions: INR 2.7  Will decrease dose to 1 tablet on Sun, Tue, Thur and continue 1.5 tabs on Mon, Wed, Fri, Sat.  Pt and MD have decided on an INR goal of closer to 2

## 2010-12-17 NOTE — Medication Information (Signed)
Summary: rov/ewj  Anticoagulant Therapy  Managed by: Cloyde Reams, RN, BSN Referring MD: [name], MD PCP: Nadine Counts, MD Supervising MD: Eden Emms MD, Theron Arista Indication 1: Atrial Fibrillation Lab Used: [facility] [place] Site: [address] INR POC 2.1 INR RANGE 2-3  Dietary changes: no    Health status changes: no    Bleeding/hemorrhagic complications: no    Recent/future hospitalizations: no    Any changes in medication regimen? no    Recent/future dental: no  Any missed doses?: no       Is patient compliant with meds? yes       Allergies: 1)  ! * Codiene 2)  ! Flagyl 3)  ! Penicillin 4)  ! Prilosec 5)  ! Norvasc 6)  ! * Latex  Anticoagulation Management History:      The patient is taking warfarin and comes in today for a routine follow up visit.  Positive risk factors for bleeding include an age of 67 years or older.  The bleeding index is 'intermediate risk'.  Positive CHADS2 values include History of CHF.  Negative CHADS2 values include Age > 2 years old.  His last INR was 1.41.  Anticoagulation responsible provider: Eden Emms MD, Theron Arista.  INR POC: 2.1.  Cuvette Lot#: [id number].  Exp: 03/2011.    Anticoagulation Management Assessment/Plan:      The patient's current anticoagulation dose is Warfarin sodium 5 mg tabs: Use as directed by Anticoagulation Clinic.  The target INR is 2.0-3.0.  The next INR is due 02/20/2010.  Anticoagulation instructions were given to patient.  Results were reviewed/authorized by Cloyde Reams, RN, BSN.  He was notified by Cloyde Reams RN.         Prior Anticoagulation Instructions: INR 1.6  Take 1.5 tablets tomorrow then resume same dosage 1.5 tablets daily except 1 tablet on Sundays, Tuesdays, and Thursdays.  Recheck in 2-3 weeks.    Current Anticoagulation Instructions: INR 2.1  Continue on same dosage 1.5 tablets daily except 1 tablet on Sundays, Tursdays, and Thursdays.  Recheck in 4 weeks.

## 2010-12-17 NOTE — Letter (Signed)
Summary: [facility]   Imported By: Marylou Mccoy 05/29/2010 11:30:55  _____________________________________________________________________  External Attachment:    Type:   Image     Comment:   External Document

## 2010-12-17 NOTE — Cardiovascular Report (Signed)
Summary: Office Visit Remote   Office Visit Remote   Imported By: Roderic Ovens 10/18/2010 15:13:42  _____________________________________________________________________  External Attachment:    Type:   Image     Comment:   External Document

## 2010-12-17 NOTE — Medication Information (Signed)
Summary: rov/ewj  Anticoagulant Therapy  Managed by: Elaina Pattee, PharmD Referring MD: [name], MD PCP: Nadine Counts, MD Supervising MD: Riley Kill MD, Maisie Fus Indication 1: Atrial Fibrillation Lab Used: [facility] [place] Site: [address] INR RANGE 2-3  Dietary changes: no    Health status changes: no    Bleeding/hemorrhagic complications: no    Recent/future hospitalizations: no    Any changes in medication regimen? no    Recent/future dental: no  Any missed doses?: no       Is patient compliant with meds? yes      Comments: Pt requests INR stays around 2.0, per Ms. Walker and Dr. Graciela Husbands, this is OK.  Allergies: 1)  ! * Codiene 2)  ! Flagyl 3)  ! Penicillin 4)  ! Prilosec 5)  ! Norvasc 6)  ! * Latex  Anticoagulation Management History:      The patient is taking warfarin and comes in today for a routine follow up visit.  Positive risk factors for bleeding include an age of 67 years or older.  The bleeding index is 'intermediate risk'.  Positive CHADS2 values include History of CHF.  Negative CHADS2 values include Age > 67 years old.  His last INR was 1.41.  Anticoagulation responsible provider: Riley Kill MD, Maisie Fus.  Cuvette Lot#: [id number].  Exp: 03/2011.    Anticoagulation Management Assessment/Plan:      The patient's current anticoagulation dose is Warfarin sodium 5 mg tabs: Use as directed by Anticoagulation Clinic.  The target INR is 2.0-3.0.  The next INR is due 03/12/2010.  Anticoagulation instructions were given to patient.  Results were reviewed/authorized by Elaina Pattee, PharmD.  He was notified by Elaina Pattee, PharmD.         Prior Anticoagulation Instructions: INR 2.1  Continue on same dosage 1.5 tablets daily except 1 tablet on Sundays, Tursdays, and Thursdays.  Recheck in 4 weeks.    Current Anticoagulation Instructions: INR 2.5. Take 0.5 tablet tomorrow, then take 1.5 tablets daily except 1 tablet Tues, Thurs, Sun.

## 2010-12-17 NOTE — Progress Notes (Signed)
Summary: INR Range 2.0-2.5  ---- Converted from flag ---- ---- 08/06/2010 8:50 AM, Claris Gladden RN wrote: Lorain Childes  ---- 08/05/2010 4:42 PM, Nathen May, MD, [facility] wrote: 2-2.5  as best as we can thanks  ---- 07/30/2010 2:46 PM, Claris Gladden RN wrote:   ---- 07/30/2010 12:01 PM, Bethena Midget, RN, BSN wrote: Pt states his INR per Dr Graciela Husbands could be 2.0, we have his range in coumadin clinic as 2.0-3.0. Please clarify. ------------------------------

## 2010-12-17 NOTE — Medication Information (Signed)
Summary: rov/tm  Anticoagulant Therapy  Managed by: Bethena Midget, RN, BSN Referring MD: [name], MD PCP: Nadine Counts, MD Supervising MD: Gala Romney MD, Reuel Boom Indication 1: Atrial Fibrillation Lab Used: [facility] [place] Site: [address] INR POC 2.2 INR RANGE 2-3  Dietary changes: no    Health status changes: no    Bleeding/hemorrhagic complications: no    Recent/future hospitalizations: no    Any changes in medication regimen? no    Recent/future dental: no  Any missed doses?: no       Is patient compliant with meds? yes       Allergies: 1)  ! * Codiene 2)  ! Flagyl 3)  ! Penicillin 4)  ! Prilosec 5)  ! Norvasc 6)  ! * Latex  Anticoagulation Management History:      Positive risk factors for bleeding include an age of 82 years or older.  The bleeding index is 'intermediate risk'.  Positive CHADS2 values include History of CHF.  Negative CHADS2 values include Age > 26 years old.  His last INR was 1.41.  Anticoagulation responsible provider: Azlaan Isidore MD, Reuel Boom.  INR POC: 2.2.  Exp: 03/2011.    Anticoagulation Management Assessment/Plan:      The patient's current anticoagulation dose is Warfarin sodium 5 mg tabs: Use as directed by Anticoagulation Clinic.  The target INR is 2.0-3.0.  The next INR is due 05/07/2010.  Anticoagulation instructions were given to patient.  Results were reviewed/authorized by Bethena Midget, RN, BSN.  He was notified by Bethena Midget, RN, BSN.         Prior Anticoagulation Instructions: INR 2.2  Continue on same dosage 1.5 tablets daily except 1 tablet on Sundays, Tuesdays, and Thursdays.  Recheck in 4 weeks.    Current Anticoagulation Instructions: INR 2.2 Continue 7.5mg s daily except 5mg s on Tuesdays, Thursdays, and Sundays. Recheck in 4 weeks.

## 2010-12-17 NOTE — Cardiovascular Report (Signed)
Summary: Office Visit Remote   Office Visit Remote   Imported By: Roderic Ovens 07/03/2010 12:00:27  _____________________________________________________________________  External Attachment:    Type:   Image     Comment:   External Document

## 2010-12-17 NOTE — Medication Information (Signed)
Summary: rov/lb  Anticoagulant Therapy  Managed by: Bethena Midget, RN, BSN Referring MD: [name], MD PCP: Nadine Counts, MD Supervising MD: Graciela Husbands MD, Viviann Spare Indication 1: Atrial Fibrillation Lab Used: [facility] [place] Site: [address] INR POC 2.4 INR RANGE 2-3  Dietary changes: no    Health status changes: no    Bleeding/hemorrhagic complications: no    Recent/future hospitalizations: no    Any changes in medication regimen? no    Recent/future dental: no  Any missed doses?: no       Is patient compliant with meds? yes      Comments: Pt states MD wants INR closer to 2.0, thus dose adjusted. With dose changed suggested 2-3 weeks RTC. Pt refuses to come in any earlier than 4 weeks. He is aware of risk.   Allergies: 1)  ! * Codiene 2)  ! Flagyl 3)  ! Penicillin 4)  ! Prilosec 5)  ! Norvasc 6)  ! * Latex  Anticoagulation Management History:      The patient is taking warfarin and comes in today for a routine follow up visit.  Positive risk factors for bleeding include an age of 65 years or older.  The bleeding index is 'intermediate risk'.  Positive CHADS2 values include History of CHF.  Negative CHADS2 values include Age > 45 years old.  His last INR was 1.41.  Anticoagulation responsible provider: Graciela Husbands MD, Viviann Spare.  INR POC: 2.4.  Cuvette Lot#: [id number].  Exp: 08/2011.    Anticoagulation Management Assessment/Plan:      The patient's current anticoagulation dose is Warfarin sodium 5 mg tabs: Use as directed by Anticoagulation Clinic.  The target INR is 2.0-3.0.  The next INR is due 07/01/2010.  Anticoagulation instructions were given to patient.  Results were reviewed/authorized by Bethena Midget, RN, BSN.  He was notified by Bethena Midget, RN, BSN.         Prior Anticoagulation Instructions: INR 2.9  (Pt reports MD wants INR closer to 2.0)  Skip tomorrow's dose then continue taking current regimen of 1 tablet on Sundays, Tuesdays, and Thursdays  and 1.5 tablets on Mondays, Wednesdays, Fridays, and Saturdays.  Recheck 4 weeks.  Try to get more green leafy vegetables in, too.    Current Anticoagulation Instructions: INR 2.4 Change dose to 1  pill everyday except 1.5 pills on Mondays, Wednesdays and Fridays.

## 2010-12-17 NOTE — Miscellaneous (Signed)
Summary: Refills furosemide,potassium chl,lisinopril and coreg CR  Clinical Lists Changes  Medications: Rx of FUROSEMIDE 40 MG TABS (FUROSEMIDE) 2 daily;  #60 x 11;  Signed;  Entered by: Charolotte Capuchin, RN;  Authorized by: [name], MD, [facility];  Method used: Electronically to [facility] [address], [place], [place], [place]  [id number], Ph: [phone], Fax: [phone] Rx of KLOR-CON M20 20 MEQ CR-TABS (POTASSIUM CHLORIDE CRYS CR) 1 by mouth daily;  #30 x 11;  Signed;  Entered by: Charolotte Capuchin, RN;  Authorized by: [name], MD, [facility];  Method used: Electronically to [facility] [address], [place], [place], [place]  [id number], Ph: [phone], Fax: [phone] Rx of LISINOPRIL 20 MG TABS (LISINOPRIL) one by mouth daily and take extra as needed for hypertension;  #45 x 11;  Signed;  Entered by: Charolotte Capuchin, RN;  Authorized by: [name], MD, [facility];  Method used: Electronically to [facility] [address] [place], [place], [place]  [id number], Ph: [phone], Fax: [phone] Rx of COREG CR 80 MG XR24H-CAP (CARVEDILOL PHOSPHATE) one by mouth daily;  #30 x 11;  Signed;  Entered by: Charolotte Capuchin, RN;  Authorized by: [name], MD, [facility];  Method used: Electronically to [facility] [address] [place], [place], [place]  [id number], Ph: [phone], Fax: [phone]    Prescriptions: COREG CR 80 MG XR24H-CAP (CARVEDILOL PHOSPHATE) one by mouth daily  #30 x 11   Entered by:   Charolotte Capuchin, RN   Authorized by:   [name], MD, [facility]   Signed by:   Charolotte Capuchin, RN on 09/03/2010   Method used:   Electronically to        [facility] (retail)       [address]       [place], [place]  [id number]       Ph: [phone]       Fax: [phone]   RxID:   [id number] LISINOPRIL 20  MG TABS (LISINOPRIL) one by mouth daily and take extra as needed for hypertension  #45 x 11   Entered by:   Charolotte Capuchin, RN   Authorized by:   [name], MD, [facility]   Signed by:   Charolotte Capuchin, RN on 09/03/2010   Method used:   Electronically to        [facility] (retail)       [address]       [place], [place]  [address]       Ph: [phone]       Fax: [phone]   RxID:   [id number] KLOR-CON M20 20 MEQ CR-TABS (POTASSIUM CHLORIDE CRYS CR) 1 by mouth daily  #30 x 11   Entered by:   Charolotte Capuchin, RN   Authorized by:   [name], MD, [facility]   Signed by:   Charolotte Capuchin, RN on 09/03/2010   Method used:   Electronically to        [facility] (retail)       [address]       [place], [place]  [id number]       Ph: [phone]       Fax: [phone]   RxID:   [id number] FUROSEMIDE 40 MG TABS (FUROSEMIDE) 2 daily  #  60 x 11   Entered by:   Charolotte Capuchin, RN   Authorized by:   [name], MD, [facility]   Signed by:   Charolotte Capuchin, RN on 09/03/2010   Method used:   Electronically to        [facility] (retail)       [address]       [place], [place]  [id number]       Ph: [phone]       Fax: [phone]   RxID:   [id number]

## 2010-12-17 NOTE — Assessment & Plan Note (Signed)
Summary: PER CHECK OUT/SF   Primary Provider:  Nadine Counts, MD  CC:  follow up. Pt notes some edema in his abdomen.  Marland Kitchen  History of Present Illness: [name] iis seen in followup for a mixed cardiomyopathy associated with chronic systolic heart failure for which he underwent CRT-D-implantation. He also has paroxysmal atrial fibrillation for which he takes Coumadin.  [name] denies SOB, chest pain, edema or palpitation  Current Medications (verified): 1)  Coreg Cr 80 Mg Xr24h-Cap (Carvedilol Phosphate) .... One By Mouth Daily 2)  Lisinopril 20 Mg Tabs (Lisinopril) .... One By Mouth Daily and Take Extra As Needed For Hypertension 3)  Furosemide 40 Mg Tabs (Furosemide) .... 2 Daily 4)  Digoxin 0.125 Mg Tabs (Digoxin) .... One By Mouth Every Other Day 5)  Warfarin Sodium 5 Mg Tabs (Warfarin Sodium) .... Use As Directed By Anticoagulation Clinic 6)  Nitroglycerin 0.4 Mg Subl (Nitroglycerin) .... One By Mouth As Needed 7)  Aspirin 81 Mg Tabs (Aspirin) .... One By Mouth As Needed 8)  Colchicine 0.6 Mg Tabs (Colchicine) .Marland Kitchen.. 1 By Mouth Daily/ As Needed 9)  Tramadol Hcl 50 Mg Tabs (Tramadol Hcl) .... One By Mouth As Needed 10)  Multivitamins   Tabs (Multiple Vitamin) .... Daily 11)  Fish Oil   Oil (Fish Oil) .... Daily 12)  Coq10 30 Mg Caps (Coenzyme Q10) .... Daily 13)  Vitamin D 1000 Unit  Tabs (Cholecalciferol) .... Daily 14)  Vitamin C 500 Mg  Tabs (Ascorbic Acid) .... Daily 15)  Green Tea 150 Mg Caps (Green Tea (Camillia Sinensis)) .... Daily 16)  Valerian Root 450 Mg Caps (Valerian) .... Daily 17)  Saw Palmetto 500 Mg Caps (Saw Palmetto (Serenoa Repens)) .... Daily 18)  Benadryl 25 Mg Tabs (Diphenhydramine Hcl) .... As Needed 19)  Calcium-Magnesium 500-250 Mg Tabs (Calcium-Magnesium) .Marland Kitchen.. 1 By Mouth Daily 20)  Klor-Con M20 20 Meq Cr-Tabs (Potassium Chloride Crys Cr) .Marland Kitchen.. 1 By Mouth Daily  Allergies (verified): 1)  ! * Codiene 2)  ! Flagyl 3)  ! Penicillin 4)  ! Prilosec 5)   ! Norvasc 6)  ! * Latex  Past History:  Past Medical History: Last updated: 09/11/2009 Ischemic/nonischemic cardiomyopathy (EF 25%) Atrial flutter status post TEE guided cardioversion Nonsustained ventricular tachycardia Chronic left bundle-branch block Chronic interstitial lung  disease Acute on chronic renal insufficiency Coronary artery disease  (total occlusion of [name] right coronary artery, left-to-right     collaterals.  He had Cypher stenting to [name] circumflex in July 2003),  Seizure disorder Multiple chemical allergies Medtronic cardioverter-defibrillator/CRT.  Hyponatremia  Past Surgical History: Last updated: 01/30/2009 ICD PLACEMENT  Family History: Last updated: 01/30/2009 Both parents had heart disease in their 26s.  Social History: Last updated: 01/30/2009 According to [name] computer, he is married.  He lives in   [place] with his wife.  He is disabled.  He does not smoke.  Vital Signs:  [name] profile:   65 year old male Height:      70 inches Weight:      187 pounds BMI:     26.93 Pulse rate:   66 / minute Pulse rhythm:   regular BP sitting:   110 / 70  (right arm) Cuff size:   regular  Vitals Entered By: Judithe Modest CMA (March 12, 2010 1:52 PM)  Physical Exam  General:  [name] was alert and oriented in no acute distress. HEENT Normal.  Neck veins were flat, carotids were brisk.  Lungs were clear.  Heart  sounds were regular without murmurs or gallops.  Abdomen was soft with active bowel sounds. There is no clubbing cyanosis or edema. Skin Warm and dry     ICD Specifications Following MD:  Sherryl Manges, MD     Referring MD:  [facility] ICD Vendor:  Medtronic     ICD Model Number:  7299     ICD Serial Number:  [id number] ICD DOI:  08/26/2006     ICD Implanting MD:  Sherryl Manges, MD  Lead 1:    Location: RA     DOI: 08/26/2006     Model #: 4098     Serial #: [id number]     Status: active Lead 2:    Location: RV     DOI:  08/26/2006     Model #: 6213     Serial #: [id number]     Status: active Lead 3:    Location: LV     DOI: 08/26/2006     Model #: 6295     Serial #: [id number]     Status: active  Indications::  ICM, CHF   ICD Follow Up Remote Check?  No Battery Voltage:  2.83 V     Charge Time:  9.55 seconds     Underlying rhythm:  SB ICD Dependent:  No       ICD Device Measurements Atrium:  Amplitude: 2.5 mV, Impedance: 424 ohms, Threshold: 1.0 V at 0.2 msec Right Ventricle:  Amplitude: 18.3 mV, Impedance: 504 ohms, Threshold: 1.0 V at 0.2 msec Left Ventricle:  Impedance: 536 ohms, Threshold: 1.0 V at 0.2 msec Configuration: LV TIP TO RV COIL Shock Impedance: 46/60 ohms   Episodes MS Episodes:  0     Shock:  0     ATP:  0     Nonsustained:  1     Atrial Pacing:  95.4%     Ventricular Pacing:  99.9%  Brady Parameters Mode DDDR     Lower Rate Limit:  60     Upper Rate Limit 110 PAV 170     Sensed AV Delay:  150  Tachy Zones VF:  200     VT:  250 (FVT VIA VF)     VT1:  162     Next Remote Date:  06/13/2010     Next Cardiology Appt Due:  02/17/2011 Tech Comments:  Normal device function.  No changes made today.  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  Estimated 9 months longevity.  ROV 12 months SK. Gypsy Balsam RN BSN  March 12, 2010 2:22 PM   Impression & Recommendations:  Problem # 1:  IMPLANTABLE  DEFIBRILLATOR CRT- MDT (ICD-V45.02) Device parameters and data were reviewed and no changes were made;  we'll plan to see him in 6 months when he sees Dr. Antoine Poche. His device longevity is estimated at about 9 months. This is for him a big disappointment  also had a lengthy discussion regarding his end-of-life desires and that in [name] event that he wanted his defibrillator turned off because of prognosis that he was assured that we would be willing to turn [name] device off  Problem # 2:  SYSTOLIC HEART FAILURE, CHRONIC (ICD-428.22) stable on his current medications His updated medication list for this problem  includes:    Coreg Cr 80 Mg Xr24h-cap (Carvedilol phosphate) ..... One by mouth daily    Lisinopril 20 Mg Tabs (Lisinopril) ..... One by mouth daily and take extra as needed for hypertension    Furosemide 40 Mg  Tabs (Furosemide) .Marland Kitchen... 2 daily    Digoxin 0.125 Mg Tabs (Digoxin) ..... One by mouth every other day    Warfarin Sodium 5 Mg Tabs (Warfarin sodium) ..... Use as directed by anticoagulation clinic    Nitroglycerin 0.4 Mg Subl (Nitroglycerin) ..... One by mouth as needed    Aspirin 81 Mg Tabs (Aspirin) ..... One by mouth as needed  Problem # 3:  CARDIOMYOPATHY, ISCHEMIC/ NONISCHEMIC (ICD-414.8) I would have been inclined to stop his aspirin; he is however status post Cypher stenting in 2003. His we will leave it on board in addition to [name] Coumadin. Further we will plan because at not to take Pradaxa His updated medication list for this problem includes:    Coreg Cr 80 Mg Xr24h-cap (Carvedilol phosphate) ..... One by mouth daily    Lisinopril 20 Mg Tabs (Lisinopril) ..... One by mouth daily and take extra as needed for hypertension    Furosemide 40 Mg Tabs (Furosemide) .Marland Kitchen... 2 daily    Digoxin 0.125 Mg Tabs (Digoxin) ..... One by mouth every other day    Warfarin Sodium 5 Mg Tabs (Warfarin sodium) ..... Use as directed by anticoagulation clinic    Nitroglycerin 0.4 Mg Subl (Nitroglycerin) ..... One by mouth as needed    Aspirin 81 Mg Tabs (Aspirin) ..... One by mouth as needed  Problem # 4:  FIBRILLATION, ATRIAL (ICD-427.31) no intercurrent atrial fibrillation His updated medication list for this problem includes:    Coreg Cr 80 Mg Xr24h-cap (Carvedilol phosphate) ..... One by mouth daily    Digoxin 0.125 Mg Tabs (Digoxin) ..... One by mouth every other day    Warfarin Sodium 5 Mg Tabs (Warfarin sodium) ..... Use as directed by anticoagulation clinic    Aspirin 81 Mg Tabs (Aspirin) ..... One by mouth as needed  [name] Instructions: 1)  You are scheduled for a device check from  home on June 13, 2010. You may send your transmission at any time that day. If you have a wireless device, [name] transmission will be sent automatically. After your physician reviews your transmission, you will receive a postcard with your next transmission date. 2)  Your physician wants you to follow-up in: 12 months with Dr Graciela Husbands.  You will receive a reminder letter in [name] mail two months in advance. If you don't receive a letter, please call our office to schedule [name] follow-up appointment.

## 2010-12-17 NOTE — Progress Notes (Signed)
Summary: refill meds--lisinopril  Phone Note Refill Request Call back at Home Phone [phone] Message from:  Patient on January 03, 2010 4:01 PM  Refills Requested: Medication #1:  LISINOPRIL 20 MG TABS one by mouth daily [facility].    Method Requested: Fax to Local Pharmacy Initial call taken by: Lorne Skeens,  January 03, 2010 4:01 PM  Follow-up for Phone Call        Rx sent electronically on 2/15. Picked on on 2/17 at 10:57. Pt notified. Pt takes and extra as needed for htn. will send in new rx. Marrion Coy, CNA  January 04, 2010 9:45 AM  Follow-up by: Marrion Coy, CNA,  January 04, 2010 9:45 AM    New/Updated Medications: LISINOPRIL 20 MG TABS (LISINOPRIL) one by mouth daily and take extra as needed for hypertension Prescriptions: LISINOPRIL 20 MG TABS (LISINOPRIL) one by mouth daily and take extra as needed for hypertension  #45 x 6   Entered by:   Marrion Coy, CNA   Authorized by:   [name], MD, [facility]   Signed by:   Marrion Coy, CNA on 01/04/2010   Method used:   Electronically to        [facility] (retail)       [address]       [place], [place]  [id number]       Ph: [phone]       Fax: [phone]   RxID:   [id number]

## 2010-12-17 NOTE — Medication Information (Signed)
Summary: rov/tm  Anticoagulant Therapy  Managed by: Jeralene Peters, PharmD Referring MD: [name], MD PCP: Nadine Counts, MD Supervising MD: Gala Romney MD, Reuel Boom Indication 1: Atrial Fibrillation Lab Used: [facility] [place] Site: [address] INR POC 2.9 INR RANGE 2-3  Dietary changes: no    Health status changes: no    Bleeding/hemorrhagic complications: no    Recent/future hospitalizations: no    Any changes in medication regimen? no    Recent/future dental: no  Any missed doses?: no       Is patient compliant with meds? yes       Current Medications (verified): 1)  Coreg Cr 80 Mg Xr24h-Cap (Carvedilol Phosphate) .... One By Mouth Daily 2)  Lisinopril 20 Mg Tabs (Lisinopril) .... One By Mouth Daily and Take Extra As Needed For Hypertension 3)  Furosemide 40 Mg Tabs (Furosemide) .... 2 Daily 4)  Digoxin 0.125 Mg Tabs (Digoxin) .... One By Mouth Every Other Day 5)  Warfarin Sodium 5 Mg Tabs (Warfarin Sodium) .... Use As Directed By Anticoagulation Clinic 6)  Nitroglycerin 0.4 Mg Subl (Nitroglycerin) .... One By Mouth As Needed 7)  Aspirin 81 Mg Tabs (Aspirin) .... One By Mouth As Needed 8)  Colchicine 0.6 Mg Tabs (Colchicine) .Marland Kitchen.. 1 By Mouth Daily/ As Needed 9)  Tramadol Hcl 50 Mg Tabs (Tramadol Hcl) .... One By Mouth As Needed 10)  Multivitamins   Tabs (Multiple Vitamin) .... Daily 11)  Fish Oil   Oil (Fish Oil) .... Daily 12)  Coq10 30 Mg Caps (Coenzyme Q10) .... Daily 13)  Vitamin D 1000 Unit  Tabs (Cholecalciferol) .... Daily 14)  Vitamin C 500 Mg  Tabs (Ascorbic Acid) .... Daily 15)  Green Tea 150 Mg Caps (Green Tea (Camillia Sinensis)) .... Daily 16)  Valerian Root 450 Mg Caps (Valerian) .... Daily 17)  Saw Palmetto 500 Mg Caps (Saw Palmetto (Serenoa Repens)) .... Daily 18)  Benadryl 25 Mg Tabs (Diphenhydramine Hcl) .... As Needed 19)  Calcium-Magnesium 500-250 Mg Tabs (Calcium-Magnesium) .Marland Kitchen.. 1 By Mouth Daily 20)  Klor-Con M20 20 Meq  Cr-Tabs (Potassium Chloride Crys Cr) .Marland Kitchen.. 1 By Mouth Daily  Allergies (verified): 1)  ! * Codiene 2)  ! Flagyl 3)  ! Penicillin 4)  ! Prilosec 5)  ! Norvasc 6)  ! * Latex  Anticoagulation Management History:      The patient is taking warfarin and comes in today for a routine follow up visit.  Positive risk factors for bleeding include an age of 67 years or older.  The bleeding index is 'intermediate risk'.  Positive CHADS2 values include History of CHF.  Negative CHADS2 values include Age > 67 years old.  His last INR was 1.41.  Anticoagulation responsible provider: Bensimhon MD, Reuel Boom.  INR POC: 2.9.  Cuvette Lot#: [id number].  Exp: 07/2011.    Anticoagulation Management Assessment/Plan:      The patient's current anticoagulation dose is Warfarin sodium 5 mg tabs: Use as directed by Anticoagulation Clinic.  The target INR is 2.0-3.0.  The next INR is due 06/04/2010.  Anticoagulation instructions were given to patient.  Results were reviewed/authorized by Jeralene Peters, PharmD.         Prior Anticoagulation Instructions: INR 2.2 Continue 7.5mg s daily except 5mg s on Tuesdays, Thursdays, and Sundays. Recheck in 4 weeks.   Current Anticoagulation Instructions: INR 2.9  (Pt reports MD wants INR closer to 2.0)  Skip tomorrow's dose then continue taking current regimen of 1 tablet on Sundays, Tuesdays, and Thursdays  and 1.5 tablets on Mondays, Wednesdays, Fridays, and Saturdays.  Recheck 4 weeks.  Try to get more green leafy vegetables in, too.

## 2010-12-17 NOTE — Letter (Signed)
Summary: Remote Device Check  [facility], Main Office  [address]   [place], [place] [address]   Phone: [phone]  Fax: [phone]     October 18, 2010 MRN: [id number]   [name] [address] [place], [place]  [address]   Dear Mr. OAXACA,   Your remote transmission was recieved and reviewed by your physician.  All diagnostics were within normal limits for you.  __X___Your next transmission is scheduled for:01/02/2011.  Please transmit at any time this day.  If you have a wireless device your transmission will be sent automatically.  ______Your next office visit is scheduled for:                              . Please call our office to schedule an appointment.    Sincerely,  Altha Harm, LPN

## 2010-12-17 NOTE — Assessment & Plan Note (Signed)
Summary: 4 mo fu   Primary Provider:  Nadine Counts, MD  CC:  CAD and Cardiomyopathy.  History of Present Illness: The patient presents for a six-month followup. Since I last saw him he has had no new problems. He is complaining of a little discomfort in his back and some very mild tightness with his breathing. However, his weights have been stable. He is not describing chest, neck or arm discomfort. He's not having swelling, PND or orthopnea. He has had no palpitations, presyncope or syncope.  Current Medications (verified): 1)  Coreg Cr 80 Mg Xr24h-Cap (Carvedilol Phosphate) .... One By Mouth Daily 2)  Lisinopril 20 Mg Tabs (Lisinopril) .... One By Mouth Daily and Take Extra As Needed For Hypertension 3)  Furosemide 40 Mg Tabs (Furosemide) .... 2 Daily 4)  Digoxin 0.125 Mg Tabs (Digoxin) .... One By Mouth Every Other Day 5)  Warfarin Sodium 5 Mg Tabs (Warfarin Sodium) .... Use As Directed By Anticoagulation Clinic 6)  Nitroglycerin 0.4 Mg Subl (Nitroglycerin) .... One By Mouth As Needed 7)  Aspirin 81 Mg Tabs (Aspirin) .... One By Mouth As Needed 8)  Colchicine 0.6 Mg Tabs (Colchicine) .Marland Kitchen.. 1 By Mouth Daily/ As Needed 9)  Tramadol Hcl 50 Mg Tabs (Tramadol Hcl) .... One By Mouth As Needed 10)  Multivitamins   Tabs (Multiple Vitamin) .... Daily 11)  Fish Oil   Oil (Fish Oil) .... Daily 12)  Coq10 30 Mg Caps (Coenzyme Q10) .... Daily 13)  Vitamin D 1000 Unit  Tabs (Cholecalciferol) .... Daily 14)  Vitamin C 500 Mg  Tabs (Ascorbic Acid) .... Daily 15)  Green Tea 150 Mg Caps (Green Tea (Camillia Sinensis)) .... Daily 16)  Valerian Root 450 Mg Caps (Valerian) .... Daily 17)  Saw Palmetto 500 Mg Caps (Saw Palmetto (Serenoa Repens)) .... Daily 18)  Benadryl 25 Mg Tabs (Diphenhydramine Hcl) .... As Needed 19)  Calcium-Magnesium 500-250 Mg Tabs (Calcium-Magnesium) .Marland Kitchen.. 1 By Mouth Daily  Allergies (verified): 1)  ! * Codiene 2)  ! Flagyl 3)  ! Penicillin 4)  ! Prilosec 5)  ! Norvasc 6)  !  * Latex  Past History:  Past Medical History: Reviewed history from 09/11/2009 and no changes required. Ischemic/nonischemic cardiomyopathy (EF 25%) Atrial flutter status post TEE guided cardioversion Nonsustained ventricular tachycardia Chronic left bundle-branch block Chronic interstitial lung  disease Acute on chronic renal insufficiency Coronary artery disease  (total occlusion of the right coronary artery, left-to-right     collaterals.  He had Cypher stenting to the circumflex in July 2003),  Seizure disorder Multiple chemical allergies Medtronic cardioverter-defibrillator/CRT.  Hyponatremia  Past Surgical History: Reviewed history from 01/30/2009 and no changes required. ICD PLACEMENT  Review of Systems       As stated in the HPI and negative for all other systems.   Vital Signs:  Patient profile:   65 year old male Height:      70 inches Weight:      186 pounds BMI:     26.78 Pulse rate:   65 / minute Resp:     16 per minute BP sitting:   146 / 88  (right arm)  Vitals Entered By: Marrion Coy, CNA (January 08, 2010 1:52 PM)  Physical Exam  General:  Well developed, well nourished, in no acute distress. Head:  normocephalic and atraumatic Eyes:  PERRLA/EOM intact; conjunctiva and lids normal. Mouth:  Teeth, gums and palate normal. Oral mucosa normal. Neck:  Neck supple, no JVD. No masses, thyromegaly or abnormal  cervical nodes. Chest Wall:  well-healed ICD pocket. Lungs:  Clear bilaterally to auscultation and percussion. Abdomen:  Bowel sounds positive; abdomen soft and non-tender without masses, organomegaly, or hernias noted. No hepatosplenomegaly. Msk:  Back normal, normal gait. Muscle strength and tone normal. Extremities:  No clubbing or cyanosis. Neurologic:  Alert and oriented x 3. Skin:  Intact without lesions or rashes. Psych:  Normal affect.   Detailed Cardiovascular Exam  Neck    Carotids: Carotids full and equal bilaterally without  bruits.      Neck Veins: Normal, no JVD.    Heart    Inspection: no deformities or lifts noted.      Palpation: normal PMI with no thrills palpable.      Auscultation: regular rate and rhythm, S1, S2 without murmurs, rubs, gallops, or clicks.    Vascular    Abdominal Aorta: no palpable masses, pulsations, or audible bruits.      Femoral Pulses: normal femoral pulses bilaterally.      Pedal Pulses: normal pedal pulses bilaterally.      Radial Pulses: normal radial pulses bilaterally.      Peripheral Circulation: no clubbing, cyanosis, or edema noted with normal capillary refill.     EKG  Procedure date:  01/08/2010  Findings:      AV paced rhythm   ICD Specifications Following MD:  Sherryl Manges, MD     Referring MD:  [facility] ICD Vendor:  Medtronic     ICD Model Number:  7299     ICD Serial Number:  [id number] ICD DOI:  08/26/2006     ICD Implanting MD:  Sherryl Manges, MD  Lead 1:    Location: RA     DOI: 08/26/2006     Model #: 5076     Serial #: [id number]     Status: active Lead 2:    Location: RV     DOI: 08/26/2006     Model #: [id number]     Serial #: [id number]     Status: active Lead 3:    Location: LV     DOI: 08/26/2006     Model #: [id number]     Serial #: [id number]     Status: active  Indications::  ICM, CHF   ICD Follow Up ICD Dependent:  No       ICD Device Measurements Configuration: LV TIP TO RV COIL  Brady Parameters Mode DDDR     Lower Rate Limit:  60     Upper Rate Limit 110 PAV 170     Sensed AV Delay:  150  Tachy Zones VF:  200     VT:  250 (FVT VIA VF)     VT1:  162     Impression & Recommendations:  Problem # 1:  SYSTOLIC HEART FAILURE, CHRONIC (ICD-428.22) He appears to be euvolemic on exam though he is concerned with his back pain that he might have some excess volume. Today when I checked a routine chemistry I will add a BNP level. For now he will remain on the meds as listed. Orders: TLB-BMP (Basic Metabolic Panel-BMET) (80048-METABOL) TLB-BNP  (B-Natriuretic Peptide) (83880-BNPR)  Problem # 2:  FIBRILLATION, ATRIAL (ICD-427.31) He has had no symptomatic recurrence of this and continues on the meds as listed. Orders: EKG w/ Interpretation (93000)  Problem # 3:  RENAL INSUFFICIENCY (ICD-588.9) I will follow this up with labs today. Orders: TLB-BMP (Basic Metabolic Panel-BMET) (80048-METABOL) TLB-BNP (B-Natriuretic Peptide) (83880-BNPR)  Problem # 4:  CAD, UNSPECIFIED  SITE (ICD-414.00) He has had no symptoms. No further cardiovascular testing is suggested.  Patient Instructions: 1)  Your physician recommends that you schedule a follow-up appointment in:  2)  Your physician recommends that you have lab work today BMP BNP 428.22 414.01 3)  Your physician recommends that you continue on your current medications as directed. Please refer to the Current Medication list given to you today. 4)  You have been diagnosed with Congestive Heart Failure or CHF.  CHF is a condition in which a problem with the structure or function of the heart impairs its ability to supply sufficient blood flow to meet the body's needs.  For further information please visit www.cardiosmart.org for detailed information on CHF.

## 2010-12-17 NOTE — Progress Notes (Signed)
  Phone Note Call from Patient   Action Taken: Phone Call Completed Details of Action Taken: Pt advised to call PCP or go to Urgent Care/ER. Summary of Call: Pt called because INR 2.9 earlier this week at Coumadin visit, and after holding 1 day (MD wants INR closer to 2 per pt), pt reports coughing up blood this AM.  He has not coughed up any more blood.  Denies abdominal pain, but has "some" pelvic pain.  Advised pt we could recheck INR, however, he would still need to FU with PCP to have this evaluated if it continued.  Pt will call back if this continues. Initial call taken by: Elaina Pattee,  May 08, 2010 10:18 AM

## 2010-12-17 NOTE — Cardiovascular Report (Signed)
Summary: Office Visit   Office Visit   Imported By: Roderic Ovens 09/05/2010 12:26:39  _____________________________________________________________________  External Attachment:    Type:   Image     Comment:   External Document

## 2010-12-17 NOTE — Cardiovascular Report (Signed)
Summary: Office Visit   Office Visit   Imported By: Roderic Ovens 03/18/2010 15:28:12  _____________________________________________________________________  External Attachment:    Type:   Image     Comment:   External Document

## 2010-12-17 NOTE — Medication Information (Signed)
Summary: rov/cb  Anticoagulant Therapy  Managed by: Cloyde Reams, RN, BSN Referring MD: [name], MD PCP: Nadine Counts, MD Supervising MD: Eden Emms MD, Theron Arista Indication 1: Atrial Fibrillation Lab Used: [facility] [place] Site: [address] INR POC 2.2 INR RANGE 2-3  Dietary changes: no    Health status changes: no    Bleeding/hemorrhagic complications: yes       Details: scratches bleed more freely.    Recent/future hospitalizations: no    Any changes in medication regimen? no    Recent/future dental: no  Any missed doses?: no       Is patient compliant with meds? yes      Comments: Pt states Dr Graciela Husbands gave pt the OK to have PT/INR done at [facility] in [place] and he would monitor.  Tiffany spoke with pt on 4/27 to inform him this was not our clinic policy and in order for [place] to follow his INRs he would have to come into the clinic.  Amber, RN is going to f/u with Dr. Graciela Husbands to make sure he is aware that he will have to dose rather than the Coumadin Clinic.  We made the pt a tenative appt in 4 weeks for the Coumadin clinic. Weston Brass PharmD  March 13, 2010 3:40 PM   Allergies: 1)  ! * Codiene 2)  ! Flagyl 3)  ! Penicillin 4)  ! Prilosec 5)  ! Norvasc 6)  ! * Latex  Anticoagulation Management History:      The patient is taking warfarin and comes in today for a routine follow up visit.  Positive risk factors for bleeding include an age of 67 years or older.  The bleeding index is 'intermediate risk'.  Positive CHADS2 values include History of CHF.  Negative CHADS2 values include Age > 71 years old.  His last INR was 1.41.  Anticoagulation responsible provider: Eden Emms MD, Theron Arista.  INR POC: 2.2.  Exp: 03/2011.    Anticoagulation Management Assessment/Plan:      The patient's current anticoagulation dose is Warfarin sodium 5 mg tabs: Use as directed by Anticoagulation Clinic.  The target INR is 2.0-3.0.  The next INR is due 04/09/2010.  Anticoagulation  instructions were given to patient.  Results were reviewed/authorized by Cloyde Reams, RN, BSN.  He was notified by Cloyde Reams RN.         Prior Anticoagulation Instructions: INR 2.5. Take 0.5 tablet tomorrow, then take 1.5 tablets daily except 1 tablet Tues, Thurs, Sun.  Current Anticoagulation Instructions: INR 2.2  Continue on same dosage 1.5 tablets daily except 1 tablet on Sundays, Tuesdays, and Thursdays.  Recheck in 4 weeks.

## 2010-12-17 NOTE — Medication Information (Signed)
Summary: rov/tm  Anticoagulant Therapy  Managed by: Weston Brass, PharmD Referring MD: [name], MD PCP: Nadine Counts, MD Supervising MD: Shirlee Latch MD, Meir Elwood Indication 1: Atrial Fibrillation Lab Used: [facility] [place] Site: [address] INR POC 2.0 INR RANGE 2-3  Dietary changes: no    Health status changes: no    Bleeding/hemorrhagic complications: no    Recent/future hospitalizations: no    Any changes in medication regimen? no    Recent/future dental: no  Any missed doses?: no       Is patient compliant with meds? yes       Allergies: 1)  ! * Codiene 2)  ! Flagyl 3)  ! Penicillin 4)  ! Prilosec 5)  ! Norvasc 6)  ! * Latex  Anticoagulation Management History:      The patient is taking warfarin and comes in today for a routine follow up visit.  Positive risk factors for bleeding include an age of 40 years or older.  The bleeding index is 'intermediate risk'.  Positive CHADS2 values include History of CHF.  Negative CHADS2 values include Age > 58 years old.  His last INR was 1.41.  Anticoagulation responsible provider: Shirlee Latch MD, Elwanda Moger.  INR POC: 2.0.  Cuvette Lot#: [id number].  Exp: 08/2011.    Anticoagulation Management Assessment/Plan:      The patient's current anticoagulation dose is Warfarin sodium 5 mg tabs: Use as directed by Anticoagulation Clinic.  The target INR is 2.0-3.0.  The next INR is due 07/30/2010.  Anticoagulation instructions were given to patient.  Results were reviewed/authorized by Weston Brass, PharmD.  He was notified by Liana Gerold, PharmD Candidate.         Prior Anticoagulation Instructions: INR 2.4 Change dose to 1  pill everyday except 1.5 pills on Mondays, Wednesdays and Fridays.   Current Anticoagulation Instructions: INR 2.0  Continue 1 tablet daily except 1.5 tablets Mon, Wed and Fri.  Return to clinic in 4 weeks.

## 2010-12-17 NOTE — Progress Notes (Signed)
Summary: returning call  Phone Note Call from Patient Call back at Home Phone [phone]   Caller: Patient Reason for Call: Talk to Nurse Summary of Call: returning call Initial call taken by: Migdalia Dk,  November 29, 2009 1:32 PM  Follow-up for Phone Call        [facility] for pt. Marrion Coy, CNA  November 29, 2009 2:35 PM  Follow-up by: Marrion Coy, CNA,  November 29, 2009 2:35 PM  Additional Follow-up for Phone Call Additional follow up Details #1::        Spoke with pt, pt is taking digoxin 0.125mg  1 by mouth every other day. I sent in rx for 1 by mouth daily to save pt money. Marrion Coy, CNA  November 30, 2009 9:58 AM  Additional Follow-up by: Marrion Coy, CNA,  November 30, 2009 9:58 AM

## 2010-12-17 NOTE — Medication Information (Signed)
Summary: [name]  Anticoagulant Therapy  Managed by: Cloyde Reams, RN, BSN Referring MD: [name], MD PCP: Nadine Counts, MD Supervising MD: Johney Frame MD, Fayrene Fearing Indication 1: Atrial Fibrillation Lab Used: [facility] [place] Site: [address] INR POC 1.6 INR RANGE 2-3  Dietary changes: no    Health status changes: no    Bleeding/hemorrhagic complications: no    Recent/future hospitalizations: no    Any changes in medication regimen? no    Recent/future dental: no  Any missed doses?: yes     Details: Missed 1 dose on Sunday  Is patient compliant with meds? yes       Allergies: 1)  ! * Codiene 2)  ! Flagyl 3)  ! Penicillin 4)  ! Prilosec 5)  ! Norvasc 6)  ! * Latex  Anticoagulation Management History:      The patient is taking warfarin and comes in today for a routine follow up visit.  Positive risk factors for bleeding include an age of 67 years or older.  The bleeding index is 'intermediate risk'.  Positive CHADS2 values include History of CHF.  Negative CHADS2 values include Age > 61 years old.  His last INR was 1.41.  Anticoagulation responsible provider: Neev Mcmains MD, Fayrene Fearing.  INR POC: 1.6.  Cuvette Lot#: [id number].  Exp: 02/2011.    Anticoagulation Management Assessment/Plan:      The patient's current anticoagulation dose is Warfarin sodium 5 mg tabs: Use as directed by Anticoagulation Clinic.  The next INR is due 01/23/2010.  Anticoagulation instructions were given to patient.  Results were reviewed/authorized by Cloyde Reams, RN, BSN.  He was notified by Cloyde Reams RN.        Coagulation management information includes: Pt and MD have decided on an INR goal closer to 2.0.  Prior Anticoagulation Instructions: INR 2.7  Will decrease dose to 1 tablet on Sun, Tue, Thur and continue 1.5 tabs on Mon, Wed, Fri, Sat.  Pt and MD have decided on an INR goal of closer to 2   Current Anticoagulation Instructions: INR 1.6  Take 1.5 tablets tomorrow  then resume same dosage 1.5 tablets daily except 1 tablet on Sundays, Tuesdays, and Thursdays.  Recheck in 2-3 weeks.

## 2010-12-19 NOTE — Medication Information (Signed)
Summary: rov/kh  Anticoagulant Therapy  Managed by: Tammy Sours PharmD Referring MD: [name], MD PCP: [facility] Supervising MD: Shirlee Latch MD, [place] Indication 1: Atrial Fibrillation Lab Used: [facility] [place] Site: [address] INR POC 2.1 INR RANGE 2-3  Dietary changes: no    Health status changes: no    Bleeding/hemorrhagic complications: no    Recent/future hospitalizations: no    Any changes in medication regimen? no    Recent/future dental: no  Any missed doses?: no       Is patient compliant with meds? yes       Allergies: 1)  ! * Codiene 2)  ! Flagyl 3)  ! Penicillin 4)  ! Prilosec 5)  ! Norvasc 6)  ! * Latex  Anticoagulation Management History:      The patient is taking warfarin and comes in today for a routine follow up visit.  Positive risk factors for bleeding include an age of 67 years or older.  The bleeding index is 'intermediate risk'.  Positive CHADS2 values include History of CHF.  Negative CHADS2 values include Age > 67 years old.  His last INR was 1.41.  Anticoagulation responsible [name]: Shirlee Latch MD, Dalton.  INR POC: 2.1.  Cuvette Lot#: [id number].  Exp: 09/2011.    Anticoagulation Management Assessment/Plan:      The patient's current anticoagulation dose is Warfarin sodium 5 mg tabs: Use as directed by Anticoagulation Clinic.  The target INR is 2.0-3.0.  The next INR is due 11/27/2010.  Anticoagulation instructions were given to patient.  Results were reviewed/authorized by Tammy Sours PharmD.         Prior Anticoagulation Instructions: INR 1.9 Take 2 tablets today, then resume previous dose of 1 tablet everyday except 1.5 tablets on Monday, Wednesday, and Friday Recheck INR in 4 weeks   Current Anticoagulation Instructions: INR 2.1   Continue curret regimen of 1 tablet everday except 1.5 tablets on Monday, Wednesday, and Friday.  Recheck INR in 4 weeks.

## 2010-12-19 NOTE — Medication Information (Signed)
Summary: rov/kh  Anticoagulant Therapy  Managed by: Bethena Midget, RN, BSN Referring MD: [name], MD PCP: [facility] Supervising MD: Johney Frame MD, Fayrene Fearing Indication 1: Atrial Fibrillation Lab Used: [facility] [place] Site: [address] INR POC 1.9 INR RANGE 2-3  Dietary changes: no    Health status changes: no    Bleeding/hemorrhagic complications: no    Recent/future hospitalizations: no    Any changes in medication regimen? no    Recent/future dental: no  Any missed doses?: no       Is patient compliant with meds? yes       Allergies: 1)  ! * Codiene 2)  ! Flagyl 3)  ! Penicillin 4)  ! Prilosec 5)  ! Norvasc 6)  ! * Latex  Anticoagulation Management History:      The patient is taking warfarin and comes in today for a routine follow up visit.  Positive risk factors for bleeding include an age of 26 years or older.  The bleeding index is 'intermediate risk'.  Positive CHADS2 values include History of CHF.  Negative CHADS2 values include Age > 42 years old.  His last INR was 1.41.  Anticoagulation responsible provider: Shalva Rozycki MD, Fayrene Fearing.  INR POC: 1.9.  Cuvette Lot#: [id number].  Exp: 12/2011.    Anticoagulation Management Assessment/Plan:      The patient's current anticoagulation dose is Warfarin sodium 5 mg tabs: Use as directed by Anticoagulation Clinic.  The target INR is 2.0-3.0.  The next INR is due 12/25/2010.  Anticoagulation instructions were given to patient.  Results were reviewed/authorized by Bethena Midget, RN, BSN.  He was notified by Bethena Midget, RN, BSN.         Prior Anticoagulation Instructions: INR 2.1   Continue curret regimen of 1 tablet everday except 1.5 tablets on Monday, Wednesday, and Friday.  Recheck INR in 4 weeks.   Current Anticoagulation Instructions: INR 1.9 Tomorrow take 1.5 tabs then resume 1 tab everyday except 1.5 tab on Mondays, Wednesdays and Fridays. Recheck in 4 weeks.

## 2010-12-25 ENCOUNTER — Encounter: Payer: Self-pay | Admitting: Internal Medicine

## 2010-12-25 ENCOUNTER — Encounter (INDEPENDENT_AMBULATORY_CARE_PROVIDER_SITE_OTHER): Payer: Medicare Other

## 2010-12-25 DIAGNOSIS — Z7901 Long term (current) use of anticoagulants: Secondary | ICD-10-CM

## 2010-12-25 DIAGNOSIS — I4891 Unspecified atrial fibrillation: Secondary | ICD-10-CM

## 2011-01-02 ENCOUNTER — Encounter: Payer: Self-pay | Admitting: Internal Medicine

## 2011-01-02 ENCOUNTER — Encounter (INDEPENDENT_AMBULATORY_CARE_PROVIDER_SITE_OTHER): Payer: Medicare Other

## 2011-01-02 DIAGNOSIS — I5022 Chronic systolic (congestive) heart failure: Secondary | ICD-10-CM

## 2011-01-02 DIAGNOSIS — I428 Other cardiomyopathies: Secondary | ICD-10-CM

## 2011-01-02 NOTE — Medication Information (Signed)
Summary: Coumadin Clinic  Anticoagulant Therapy  Managed by: Windell Hummingbird, RN Referring MD: [name], MD PCP: [facility] Supervising MD: Graciela Husbands MD, Viviann Spare Indication 1: Atrial Fibrillation Lab Used: [facility] [place] Site: [address] INR RANGE 2-3  Dietary changes: no    Health status changes: no    Bleeding/hemorrhagic complications: no    Recent/future hospitalizations: no    Any changes in medication regimen? no    Recent/future dental: no  Any missed doses?: no       Is patient compliant with meds? yes       Allergies: 1)  ! * Codiene 2)  ! Flagyl 3)  ! Penicillin 4)  ! Prilosec 5)  ! Norvasc 6)  ! * Latex  Anticoagulation Management History:      Positive risk factors for bleeding include an age of 80 years or older.  The bleeding index is 'intermediate risk'.  Positive CHADS2 values include History of CHF.  Negative CHADS2 values include Age > 67 years old.  His last INR was 1.41.  Anticoagulation responsible provider: Graciela Husbands MD, Viviann Spare.  Exp: 12/2011.    Anticoagulation Management Assessment/Plan:      The patient's current anticoagulation dose is Warfarin sodium 5 mg tabs: Use as directed by Anticoagulation Clinic.  The target INR is 2.0-3.0.  The next INR is due 01/22/2011.  Anticoagulation instructions were given to patient.  Results were reviewed/authorized by Windell Hummingbird, RN.  He was notified by Windell Hummingbird, RN.         Prior Anticoagulation Instructions: INR 1.9 Tomorrow take 1.5 tabs then resume 1 tab everyday except 1.5 tab on Mondays, Wednesdays and Fridays. Recheck in 4 weeks.   Current Anticoagulation Instructions: INR 2.1 Continue taking 1 tablet everyday, except take 1.5 tablets on Mondays, Wednesdays, and Fridays. Recheck in 4 weeks.

## 2011-01-16 ENCOUNTER — Encounter (INDEPENDENT_AMBULATORY_CARE_PROVIDER_SITE_OTHER): Payer: Self-pay | Admitting: *Deleted

## 2011-01-21 ENCOUNTER — Encounter: Payer: Self-pay | Admitting: Cardiology

## 2011-01-21 DIAGNOSIS — I4892 Unspecified atrial flutter: Secondary | ICD-10-CM

## 2011-01-21 DIAGNOSIS — I4891 Unspecified atrial fibrillation: Secondary | ICD-10-CM

## 2011-01-22 ENCOUNTER — Encounter (INDEPENDENT_AMBULATORY_CARE_PROVIDER_SITE_OTHER): Payer: Medicare Other

## 2011-01-22 ENCOUNTER — Encounter: Payer: Self-pay | Admitting: Internal Medicine

## 2011-01-22 DIAGNOSIS — Z7901 Long term (current) use of anticoagulants: Secondary | ICD-10-CM

## 2011-01-22 DIAGNOSIS — I4891 Unspecified atrial fibrillation: Secondary | ICD-10-CM

## 2011-01-23 NOTE — Letter (Signed)
Summary: Remote Device Check  [facility], Main Office  [address]   [place], [place] [address]   Phone: [phone]  Fax: [phone]     January 16, 2011 MRN: [id number]   LACOREY BRUSCA [address] [place], [place]  [address]   Dear Mr. BOLLMAN,   Your remote transmission was recieved and reviewed by your physician.  All diagnostics were within normal limits for you.  _____Your next transmission is scheduled for:                       .  Please transmit at any time this day.  If you have a wireless device your transmission will be sent automatically.  ___X___Your next office visit is scheduled for:   April 2012 with Dr. Graciela Husbands.                           Marland Kitchen Please call our office to schedule an appointment.    Sincerely,  Altha Harm, LPN

## 2011-01-23 NOTE — Cardiovascular Report (Signed)
Summary: Office Visit   Office Visit   Imported By: Roderic Ovens 01/16/2011 16:01:50  _____________________________________________________________________  External Attachment:    Type:   Image     Comment:   External Document

## 2011-01-28 NOTE — Medication Information (Signed)
Summary: rov/pc  Anticoagulant Therapy  Managed by: Bayard Hugger, PharmD Referring MD: [name], MD PCP: [facility] Supervising MD: Graciela Husbands MD, Viviann Spare Indication 1: Atrial Fibrillation Lab Used: [facility] [place] Site: [address] INR POC 2.1 INR RANGE 2-3  Dietary changes: no    Health status changes: no    Bleeding/hemorrhagic complications: yes       Details: Pt had some vision change 2 weeks ago, saw eye doctor, concern of bleeding in the eye, but eye doctor suggest to continue coumadin. Pt will see eye doctor again on 3/20  Recent/future hospitalizations: no    Any changes in medication regimen? no    Recent/future dental: no  Any missed doses?: no       Is patient compliant with meds? yes       Allergies: 1)  ! * Codiene 2)  ! Flagyl 3)  ! Penicillin 4)  ! Prilosec 5)  ! Norvasc 6)  ! * Latex  Anticoagulation Management History:      Positive risk factors for bleeding include an age of 67 years or older.  The bleeding index is 'intermediate risk'.  Positive CHADS2 values include History of CHF.  Negative CHADS2 values include Age > 77 years old.  His last INR was 1.41.  Anticoagulation responsible provider: Graciela Husbands MD, Viviann Spare.  INR POC: 2.1.  Exp: 12/2011.    Anticoagulation Management Assessment/Plan:      The patient's current anticoagulation dose is Warfarin sodium 5 mg tabs: Use as directed by Anticoagulation Clinic.  The target INR is 2.0-3.0.  The next INR is due 02/25/2011.  Anticoagulation instructions were given to patient.  Results were reviewed/authorized by Bayard Hugger, PharmD.         Prior Anticoagulation Instructions: INR 2.1 Continue taking 1 tablet everyday, except take 1.5 tablets on Mondays, Wednesdays, and Fridays. Recheck in 4 weeks.  Current Anticoagulation Instructions: INR 2.1 The patient is to continue with the same dose of coumadin.  This dosage includes:  1.5 tablets on Mon, Wed, Fri 1 tablet on Tue, Thur,  Sat, and Sun Recheck INR on April 10th

## 2011-02-14 ENCOUNTER — Telehealth: Payer: Self-pay | Admitting: Physician Assistant

## 2011-02-14 NOTE — Telephone Encounter (Signed)
Returned call from patient concerning noise from AICD.  Pt states that about 10 til 9 this evening his device made a noise.  It did not shock him but just made noise.  His wife told him this also occurred last night at the same time.  Pt was calling to see if device needed to be interrogated remotely or not.  Again, he has had no shocks and the noise has only occurred twice.  I have reassured the pt that the device will continue to function normally but I will send the message to SK and his nurse so they can determine when to replace his device.  I have also informed him that it would not be harmful to send in a tracing.  Pt voiced understanding and states he will call the office if he does not hear from our group by the first of the week.  He appreciated the call back.

## 2011-02-17 NOTE — Telephone Encounter (Signed)
Could you call  Him thnks

## 2011-02-17 NOTE — Telephone Encounter (Signed)
Per kristin device is at [employer] needs f/u with Dr Graciela Husbands  Scheduled appt  for 02/24/11 at 2 pm./cy

## 2011-02-18 ENCOUNTER — Other Ambulatory Visit: Payer: Self-pay | Admitting: Cardiology

## 2011-02-18 ENCOUNTER — Telehealth: Payer: Self-pay | Admitting: Cardiology

## 2011-02-18 NOTE — Telephone Encounter (Signed)
Pt states he having back,stomach pain. Pt having sob. Pt states his blood pressure is been up 172/87 pulse 73. Pt wants to talk to a nurse

## 2011-02-18 NOTE — Telephone Encounter (Signed)
Spoke to patient-c/o defibrillator going off since Thursday and back and stomach is hurting with SOB.  Did not rest much last night.  Has appt. With Dr. Graciela Husbands on the 9th but feels he may need to be seen sooner.  Will be on his cell at [phone].  Wants to speak with Dr. Odessa Fleming nurse.

## 2011-02-18 NOTE — Telephone Encounter (Signed)
LMTCB  PER DR Graciela Husbands S/S ARE NOT COMING FROM GEN NEEDING CHANGED

## 2011-02-20 ENCOUNTER — Encounter: Payer: Self-pay | Admitting: Internal Medicine

## 2011-02-24 ENCOUNTER — Encounter: Payer: Self-pay | Admitting: *Deleted

## 2011-02-24 ENCOUNTER — Encounter: Payer: Self-pay | Admitting: Internal Medicine

## 2011-02-24 ENCOUNTER — Ambulatory Visit (INDEPENDENT_AMBULATORY_CARE_PROVIDER_SITE_OTHER): Payer: Medicare Other | Admitting: Internal Medicine

## 2011-02-24 ENCOUNTER — Ambulatory Visit (INDEPENDENT_AMBULATORY_CARE_PROVIDER_SITE_OTHER): Payer: Medicare Other | Admitting: *Deleted

## 2011-02-24 VITALS — BP 130/80 | HR 67 | Ht 70.0 in | Wt 135.0 lb

## 2011-02-24 DIAGNOSIS — I447 Left bundle-branch block, unspecified: Secondary | ICD-10-CM

## 2011-02-24 DIAGNOSIS — I255 Ischemic cardiomyopathy: Secondary | ICD-10-CM

## 2011-02-24 DIAGNOSIS — I4891 Unspecified atrial fibrillation: Secondary | ICD-10-CM

## 2011-02-24 DIAGNOSIS — Z7901 Long term (current) use of anticoagulants: Secondary | ICD-10-CM

## 2011-02-24 DIAGNOSIS — I2589 Other forms of chronic ischemic heart disease: Secondary | ICD-10-CM

## 2011-02-24 DIAGNOSIS — I4892 Unspecified atrial flutter: Secondary | ICD-10-CM

## 2011-02-24 DIAGNOSIS — Z95810 Presence of automatic (implantable) cardiac defibrillator: Secondary | ICD-10-CM

## 2011-02-24 LAB — IRON AND TIBC
Iron: 80 ug/dL (ref 42–135)
Saturation Ratios: 31 % (ref 20–55)
UIBC: 180 ug/dL

## 2011-02-24 LAB — CBC
HCT: 30.2 % — ABNORMAL LOW (ref 39.0–52.0)
HCT: 31.6 % — ABNORMAL LOW (ref 39.0–52.0)
Hemoglobin: 10.3 g/dL — ABNORMAL LOW (ref 13.0–17.0)
MCV: 101.3 fL — ABNORMAL HIGH (ref 78.0–100.0)
RBC: 3.12 MIL/uL — ABNORMAL LOW (ref 4.22–5.81)
RDW: 13.4 % (ref 11.5–15.5)
WBC: 6.3 10*3/uL (ref 4.0–10.5)
WBC: 7.6 10*3/uL (ref 4.0–10.5)

## 2011-02-24 LAB — OSMOLALITY: Osmolality: 270 mOsm/kg — ABNORMAL LOW (ref 275–300)

## 2011-02-24 LAB — BASIC METABOLIC PANEL
Chloride: 100 mEq/L (ref 96–112)
Chloride: 96 mEq/L (ref 96–112)
Chloride: 98 mEq/L (ref 96–112)
Creatinine, Ser: 0.95 mg/dL (ref 0.4–1.5)
GFR calc Af Amer: >60 mL/min (ref >60)
GFR calc Af Amer: >60 mL/min (ref >60)
GFR calc non Af Amer: >60 mL/min (ref >60)
Glucose, Bld: 121 mg/dL — ABNORMAL HIGH (ref 70–99)
Potassium: 4.5 mEq/L (ref 3.5–5.1)
Potassium: 4.1 mEq/L (ref 3.5–5.1)
Potassium: 4.6 mEq/L (ref 3.5–5.1)
Sodium: 131 mEq/L — ABNORMAL LOW (ref 135–145)
Sodium: 128 mEq/L — ABNORMAL LOW (ref 135–145)

## 2011-02-24 LAB — BRAIN NATRIURETIC PEPTIDE: Pro B Natriuretic peptide (BNP): 203 pg/mL — ABNORMAL HIGH (ref 0.0–100.0)

## 2011-02-24 LAB — VITAMIN B12: Vitamin B-12: >2000 pg/mL — ABNORMAL HIGH (ref 211–911)

## 2011-02-24 LAB — PROTIME-INR
INR: 1.3 (ref 0.00–1.49)
Prothrombin Time: 16.5 seconds — ABNORMAL HIGH (ref 11.6–15.2)

## 2011-02-24 LAB — OSMOLALITY, URINE: Osmolality, Ur: 343 mOsm/kg — ABNORMAL LOW (ref 390–1090)

## 2011-02-24 NOTE — Patient Instructions (Signed)
Your physician has requested that you have an [employer] For further information please visit [email or web address] Please follow instruction sheet, as given. Your physician recommends that you return for lab work same day as stress test. Your physician has recommended that you have a defibrillator generator change.  Please see instruction sheet given to you today for instructions.

## 2011-02-24 NOTE — Progress Notes (Signed)
HPI  [name] is a 65 y.o. male   seen in followup for a mixed cardiomyopathy associated with chronic systolic heart failure for which he underwent CRT-D-implantation. He also has paroxysmal atrial fibrillation for which he takes Coumadin.  He has done relatively well without chest pain. He has had problems with fatigue and shortness of breath. He tries to adjust his diuretics gently so as not to provoke gout. He's had no recent peripheral edema.  His device has reached ERI; He has a 6949-lead in place  Past Medical History  Diagnosis Date  . Ischemic cardiomyopathy   . Atrial flutter     s/p TEE guided cardioversion  . Nonsustained ventricular tachycardia   . LBBB (left bundle branch block)   . Chronic drug-induced interstitial lung disorders   . Acute on chronic renal insufficiency   . Coronary artery disease     total occlusion of the right coronary artery, left to right collaterals.  He had Cypher stenting to the circumflex in  July 2003  . Seizure disorder   . AICD (automatic cardioverter/defibrillator) present     Medtronic CRT  . Hyponatremia     Past Surgical History  Procedure Date  . Insert / replace / remove pacemaker     AICD medtronic    Current Outpatient Prescriptions  Medication Sig Dispense Refill  . aspirin 81 MG tablet Take 81 mg by mouth daily.        . Calcium-Magnesium 500-250 MG TABS Take 1 tablet by mouth.        . carvedilol (COREG CR) 80 MG 24 hr capsule Take 80 mg by mouth daily.        Marland Kitchen co-enzyme Q-10 30 MG capsule Take 30 mg by mouth 3 (three) times daily.        . colchicine 0.6 MG tablet Take 0.6 mg by mouth daily.        . digoxin (LANOXIN) 0.125 MG tablet Take 125 mcg by mouth daily.        . diphenhydrAMINE (BENADRYL) 25 mg capsule Take 25 mg by mouth every 6 (six) hours as needed.        . furosemide (LASIX) 40 MG tablet Take 40 mg by mouth 2 (two) times daily.        Marland Kitchen lisinopril (PRINIVIL,ZESTRIL) 20 MG tablet Take 20 mg by mouth  daily.        . Multiple Vitamin (MULTIVITAMIN) capsule Take 1 capsule by mouth daily.        . potassium chloride SA (KLOR-CON M20) 20 MEQ tablet Take 20 mEq by mouth 2 (two) times daily.        . saw palmetto 500 MG capsule Take 500 mg by mouth daily.        Marland Kitchen warfarin (JANTOVEN) 5 MG tablet Take 1 tablet (5 mg total) by mouth as directed.  45 tablet  3    Allergies  Allergen Reactions  . Amlodipine Besylate   . Codeine   . Latex   . Metronidazole   . Omeprazole   . Penicillins     Review of Systems negative except from HPI and PMH  Physical Exam Well developed and well nourished in no acute distress HENT normal E scleral and icterus clear Neck Supple JVP flat; carotids brisk and full Clear to ausculation Regular rate and rhythm, no murmurs gallops or rub Soft with active bowel sounds No clubbing cyanosis and edema Alert and oriented, grossly normal motor and sensory function  Skin Warm and Dry  ECG p synchronous pacing  Assessment and  Plan'

## 2011-02-25 ENCOUNTER — Encounter: Payer: Medicare Other | Admitting: *Deleted

## 2011-02-25 NOTE — Telephone Encounter (Signed)
Pt aware s/s not coming from needing gen change per dr klein./cy

## 2011-03-03 ENCOUNTER — Other Ambulatory Visit (INDEPENDENT_AMBULATORY_CARE_PROVIDER_SITE_OTHER): Payer: Medicare Other | Admitting: *Deleted

## 2011-03-03 ENCOUNTER — Ambulatory Visit (HOSPITAL_COMMUNITY): Payer: Medicare Other | Attending: Internal Medicine | Admitting: Radiology

## 2011-03-03 VITALS — Ht 70.0 in | Wt 182.0 lb

## 2011-03-03 DIAGNOSIS — I428 Other cardiomyopathies: Secondary | ICD-10-CM

## 2011-03-03 DIAGNOSIS — R0609 Other forms of dyspnea: Secondary | ICD-10-CM

## 2011-03-03 DIAGNOSIS — I2510 Atherosclerotic heart disease of native coronary artery without angina pectoris: Secondary | ICD-10-CM

## 2011-03-03 DIAGNOSIS — I4891 Unspecified atrial fibrillation: Secondary | ICD-10-CM | POA: Insufficient documentation

## 2011-03-03 DIAGNOSIS — E870 Hyperosmolality and hypernatremia: Secondary | ICD-10-CM

## 2011-03-03 DIAGNOSIS — I447 Left bundle-branch block, unspecified: Secondary | ICD-10-CM

## 2011-03-03 LAB — BASIC METABOLIC PANEL
Chloride: 102 mEq/L (ref 96–112)
Creatinine, Ser: 1.2 mg/dL (ref 0.4–1.5)
GFR: 62.51 mL/min (ref >60.00)

## 2011-03-03 LAB — CBC WITH DIFFERENTIAL/PLATELET
Basophils Relative: 0.4 % (ref 0.0–3.0)
Eosinophils Relative: 0.6 % (ref 0.0–5.0)
Lymphocytes Relative: 33.4 % (ref 12.0–46.0)
MCV: 100.3 fl — ABNORMAL HIGH (ref 78.0–100.0)
Monocytes Absolute: 0.6 10*3/uL (ref 0.1–1.0)
Monocytes Relative: 13 % — ABNORMAL HIGH (ref 3.0–12.0)
Neutrophils Relative %: 52.6 % (ref 43.0–77.0)
RBC: 3.74 Mil/uL — ABNORMAL LOW (ref 4.22–5.81)
WBC: 4.3 10*3/uL — ABNORMAL LOW (ref 4.5–10.5)

## 2011-03-03 LAB — APTT: aPTT: 34.4 s — ABNORMAL HIGH (ref 21.7–28.8)

## 2011-03-03 LAB — PROTIME-INR: INR: 2.3 ratio — ABNORMAL HIGH (ref 0.8–1.0)

## 2011-03-03 MED ORDER — TECHNETIUM TC 99M TETROFOSMIN IV KIT
11.0000 | PACK | Freq: Once | INTRAVENOUS | Status: AC | PRN
  Administered 2011-03-03: 11 via INTRAVENOUS

## 2011-03-03 MED ORDER — TECHNETIUM TC 99M TETROFOSMIN IV KIT
33.0000 | PACK | Freq: Once | INTRAVENOUS | Status: AC | PRN
  Administered 2011-03-03: 33 via INTRAVENOUS

## 2011-03-03 MED ORDER — ADENOSINE (DIAGNOSTIC) 3 MG/ML IV SOLN
0.5600 mg/kg | Freq: Once | INTRAVENOUS | Status: AC
  Administered 2011-03-03: 46.2 mg via INTRAVENOUS

## 2011-03-03 NOTE — Progress Notes (Signed)
[facility] SITE 3 NUCLEAR MED [address] [place] [address] [phone]  Cardiology Nuclear Med Study  OBI SCRIMA is a 65 y.o. male [id number] May 15, 1946   Nuclear Med Background Indication for Stress Test:  Evaluation for Ischemia, Stent Patency and Pending Generator Replacement on 03/06/11 by Dr. Berton Mount History:  '03 Stent-LCX, '04 MPS:no ischemia, EF=31%, '07 ICD, h/o atrial fibrillation, NSVT, ICM Cardiac Risk Factors: Family History - CAD, Hypertension and LBBB  Symptoms:  DOE and Fatigue   Nuclear Pre-Procedure Caffeine/Decaff Intake:  7:00pm NPO After: 11:00pm   Lungs:  Clear.  O2 sat 99% on RA. IV 0.9% NS with Angio Cath:  20g  IV Site: R Hand  IV Started by:  Cathlyn Parsons, RN  Chest Size (in):  42" Cup Size: n/a  Height: 5\' 10"  (1.778 m)  Weight:  182 lb (82.555 kg)  BMI:  Body mass index is 26.11 kg/(m^2). Tech Comments:  Coreg held x 24 hrs    Nuclear Med Study 1 or 2 day study: 1 day  Stress Test Type:  Adenosine  Reading MD: Willa Rough, MD  Order Authorizing Provider:  Sherryl Manges, MD  Resting Radionuclide: Technetium 68m Tetrofosmin  Resting Radionuclide Dose: 11 mCi   Stress Radionuclide:  Technetium 38m Tetrofosmin  Stress Radionuclide Dose: 33 mCi           Stress Protocol Rest HR: 61 Stress HR: 81  Rest BP: 116/93 Stress BP: 128/90  Exercise Time (min): n/a METS: n/a   Predicted Max HR: 154 bpm % Max HR: 52.6 bpm Rate Pressure Product: 21308   Dose of Adenosine (mg):  46.3 Dose of Lexiscan: n/a mg  Dose of Atropine (mg): n/a Dose of Dobutamine: n/a mcg/kg/min (at max HR)  Stress Test Technologist: [employer], CMA-N  Nuclear Technologist:  Domenic Polite, CNMT     Rest Procedure:  Myocardial perfusion imaging was performed at rest 45 minutes following the intravenous administration of Technetium 83m Tetrofosmin. Rest ECG: AV Paced.  Stress Procedure:  The patient received IV adenosine at 140  mcg/kg/min for 4 minutes.  There were no significant changes with infusion.  He did experience chest pressure with infusion.  Technetium 25m Tetrofosmin was injected at the 2 minute mark and quantitative spect images were obtained after a 45 minute delay. Stress ECG: No significant change from baseline ECG  QPS Raw Data Images:  Patient motion noted; appropriate software correction applied. Stress Images:  Moderate decrease in activity in the inferior wall and lateral wall Rest Images:  same as stress Subtraction (SDS):  No evidence of ischemia. Transient Ischemic Dilatation (Normal <1.22):  1.05 Lung/Heart Ratio (Normal <0.45):  0.35  Quantitative Gated Spect Images QGS EDV:  219 ml QGS ESV:  158 ml QGS cine images:  Diffuse hypokinesis QGS EF: 28%  Impression Exercise Capacity:  Adenosine study with no exercise. BP Response:  Normal blood pressure response. Clinical Symptoms:  Chest pressure ECG Impression:  No significant ST segment change suggestive of ischemia. Comparison with Prior Nuclear Study: No images to compare  Overall Impression:  Evidence of inferior and infero-lateral scar with no significant ischemia. LV dysfunction.    Willa Rough

## 2011-03-04 ENCOUNTER — Telehealth: Payer: Self-pay | Admitting: Cardiology

## 2011-03-04 ENCOUNTER — Other Ambulatory Visit: Payer: Self-pay | Admitting: *Deleted

## 2011-03-04 ENCOUNTER — Encounter: Payer: Medicare Other | Admitting: Internal Medicine

## 2011-03-04 NOTE — Telephone Encounter (Signed)
Pt confused about appt with dr h, his appt is 4-25 but he has some kind of test 4-19 and thought he was to see dr h before then

## 2011-03-04 NOTE — Progress Notes (Signed)
Copy routed to Dr. Graciela Husbands.Mirna Mires

## 2011-03-06 ENCOUNTER — Observation Stay (HOSPITAL_COMMUNITY)
Admission: RE | Admit: 2011-03-06 | Discharge: 2011-03-07 | Disposition: A | Discharge status: Home / Self Care | Payer: Medicare Other | Source: Ambulatory Visit | Attending: Internal Medicine | Admitting: Internal Medicine

## 2011-03-06 ENCOUNTER — Ambulatory Visit (HOSPITAL_COMMUNITY): Payer: Medicare Other

## 2011-03-06 DIAGNOSIS — R569 Unspecified convulsions: Secondary | ICD-10-CM | POA: Insufficient documentation

## 2011-03-06 DIAGNOSIS — Y831 Surgical operation with implant of artificial internal device as the cause of abnormal reaction of the patient, or of later complication, without mention of misadventure at the time of the procedure: Secondary | ICD-10-CM | POA: Insufficient documentation

## 2011-03-06 DIAGNOSIS — J8410 Pulmonary fibrosis, unspecified: Secondary | ICD-10-CM | POA: Insufficient documentation

## 2011-03-06 DIAGNOSIS — Z7901 Long term (current) use of anticoagulants: Secondary | ICD-10-CM | POA: Insufficient documentation

## 2011-03-06 DIAGNOSIS — I2589 Other forms of chronic ischemic heart disease: Secondary | ICD-10-CM

## 2011-03-06 DIAGNOSIS — I509 Heart failure, unspecified: Secondary | ICD-10-CM | POA: Insufficient documentation

## 2011-03-06 DIAGNOSIS — Z4502 Encounter for adjustment and management of automatic implantable cardiac defibrillator: Principal | ICD-10-CM | POA: Insufficient documentation

## 2011-03-06 DIAGNOSIS — I447 Left bundle-branch block, unspecified: Secondary | ICD-10-CM | POA: Insufficient documentation

## 2011-03-06 DIAGNOSIS — N189 Chronic kidney disease, unspecified: Secondary | ICD-10-CM | POA: Insufficient documentation

## 2011-03-06 DIAGNOSIS — I2510 Atherosclerotic heart disease of native coronary artery without angina pectoris: Secondary | ICD-10-CM | POA: Insufficient documentation

## 2011-03-06 DIAGNOSIS — Z9861 Coronary angioplasty status: Secondary | ICD-10-CM | POA: Insufficient documentation

## 2011-03-06 DIAGNOSIS — I5022 Chronic systolic (congestive) heart failure: Secondary | ICD-10-CM | POA: Insufficient documentation

## 2011-03-06 DIAGNOSIS — T82897A Other specified complication of cardiac prosthetic devices, implants and grafts, initial encounter: Secondary | ICD-10-CM | POA: Insufficient documentation

## 2011-03-06 DIAGNOSIS — T82198A Other mechanical complication of other cardiac electronic device, initial encounter: Secondary | ICD-10-CM

## 2011-03-06 LAB — PROTIME-INR: Prothrombin Time: 27.1 seconds — ABNORMAL HIGH (ref 11.6–15.2)

## 2011-03-06 LAB — SURGICAL PCR SCREEN
MRSA, PCR: NEGATIVE
Staphylococcus aureus: NEGATIVE

## 2011-03-06 NOTE — Telephone Encounter (Signed)
Left message for [name] that 4/25 appt is for his 6 month ROV and that he should keep that appt.  I dont see where Dr Antoine Poche has ordered any other testing etc.  Requested if [name] has other questions before appt to call back.

## 2011-03-07 ENCOUNTER — Observation Stay (HOSPITAL_COMMUNITY): Payer: Medicare Other

## 2011-03-11 ENCOUNTER — Encounter: Payer: Medicare Other | Admitting: *Deleted

## 2011-03-11 ENCOUNTER — Ambulatory Visit (INDEPENDENT_AMBULATORY_CARE_PROVIDER_SITE_OTHER): Payer: Medicare Other | Admitting: Internal Medicine

## 2011-03-11 DIAGNOSIS — L0390 Cellulitis, unspecified: Secondary | ICD-10-CM | POA: Insufficient documentation

## 2011-03-11 DIAGNOSIS — L0291 Cutaneous abscess, unspecified: Secondary | ICD-10-CM

## 2011-03-11 DIAGNOSIS — B999 Unspecified infectious disease: Secondary | ICD-10-CM

## 2011-03-11 MED ORDER — SULFAMETHOXAZOLE-TRIMETHOPRIM 800-160 MG PO TABS: ORAL_TABLET | ORAL | Status: DC

## 2011-03-11 MED ORDER — CEPHALEXIN 500 MG PO CAPS: ORAL_CAPSULE | ORAL | Status: DC

## 2011-03-11 NOTE — Progress Notes (Signed)
[name] is seen HPI  [name] is a 65 y.o. male   seen in followup for a mixed cardiomyopathy associated with chronic systolic heart failure for which he underwent CRT-D-implantation. He also has paroxysmal atrial fibrillation for which he takes Coumadin.  He has done relatively well without chest pain. He has had problems with fatigue and shortness of breath. He tries to adjust his diuretics gently so as not to provoke gout. He's had no recent peripheral edema.  He just underwent to further generator replacement with  insertion of a new defibrillator lead. He comes in today with erythema and tenderness on his left hand. He thought that it was gout. IT has however not responded to colchicine.  Past Medical History  Diagnosis Date  . Ischemic cardiomyopathy   . Atrial flutter     s/p TEE guided cardioversion  . Nonsustained ventricular tachycardia   . LBBB (left bundle branch block)   . Chronic drug-induced interstitial lung disorders   . Acute on chronic renal insufficiency   . Coronary artery disease     total occlusion of the right coronary artery, left to right collaterals.  He had Cypher stenting to the circumflex in  July 2003  . Seizure disorder   . AICD (automatic cardioverter/defibrillator) present     Medtronic CRT  . Hyponatremia     Past Surgical History  Procedure Date  . Insert / replace / remove pacemaker     AICD medtronic    Current Outpatient Prescriptions  Medication Sig Dispense Refill  . aspirin 81 MG tablet Take 81 mg by mouth daily.        . Calcium-Magnesium 500-250 MG TABS Take 1 tablet by mouth.        . carvedilol (COREG CR) 80 MG 24 hr capsule Take 80 mg by mouth daily.        Marland Kitchen co-enzyme Q-10 30 MG capsule Take 30 mg by mouth 3 (three) times daily.        . colchicine 0.6 MG tablet Take 0.6 mg by mouth daily.        . digoxin (LANOXIN) 0.125 MG tablet Take 125 mcg by mouth daily.        . diphenhydrAMINE (BENADRYL) 25 mg capsule Take 25 mg  by mouth every 6 (six) hours as needed.        . furosemide (LASIX) 40 MG tablet Take 40 mg by mouth 2 (two) times daily.        Marland Kitchen lisinopril (PRINIVIL,ZESTRIL) 20 MG tablet Take 20 mg by mouth daily.        . Multiple Vitamin (MULTIVITAMIN) capsule Take 1 capsule by mouth daily.        . potassium chloride SA (KLOR-CON M20) 20 MEQ tablet Take 20 mEq by mouth 2 (two) times daily.        . saw palmetto 500 MG capsule Take 500 mg by mouth daily.        Marland Kitchen warfarin (JANTOVEN) 5 MG tablet Take 1 tablet (5 mg total) by mouth as directed.  45 tablet  3    Allergies  Allergen Reactions  . Amlodipine Besylate   . Codeine   . Latex   . Metronidazole   . Omeprazole   . Penicillins     Revie  There is erythema over the dorsum of his left hand. It is tender. There is no significant swelling in the forearm or the upper arm to suggest venous thrombosis.

## 2011-03-11 NOTE — Assessment & Plan Note (Signed)
The patient has what appears to be a cellulitis of the dorsum of the left hand. It might be related to her previous IV. Given the new its lateral lead, I think aggressive antibiotic therapy is indicated. He is penicillin allergic. He thinks that he is taking Keflex however the past. I've discussed the issue with infectious diseases. In the setting of an allergy, but would recommend either clindamycin doxycycline or Septra.. We have elected 2 given 2 prescriptions one for Keflex in the event that it was in fact feels her prior cellulitis and the other is for Septra. He is to be seen in device clinic in about 10 days' time.

## 2011-03-12 ENCOUNTER — Ambulatory Visit: Payer: Medicare Other | Admitting: Cardiology

## 2011-03-12 ENCOUNTER — Telehealth: Payer: Self-pay | Admitting: Cardiology

## 2011-03-12 NOTE — Op Note (Signed)
NAMEDAKOTA, VANWART                ACCOUNT NO.:  [id number]  MEDICAL RECORD NO.:  [id number]           PATIENT TYPE:  O  LOCATION:  6527                         FACILITY:  [facility]  PHYSICIAN:  Duke Salvia, MD, FACCDATE OF BIRTH:  1946-08-13  DATE OF PROCEDURE: DATE OF DISCHARGE:                              OPERATIVE REPORT   PREOPERATIVE DIAGNOSIS:  Previously implanted implantable cardioverter- defibrillator with a [phone] recall lead.  POSTOPERATIVE DIAGNOSIS:  Previously implanted implantable cardioverter- defibrillator with a [id number] recall lead.  PROCEDURES:  Contrast venogram, explantation of a previously implanted device, insertion of a new defibrillator lead, insertion of a new defibrillator pulse generator, lead repair.  Pocket revision.  Following obtaining informed consent, the patient was brought to electrophysiology laboratory and placed on the fluoroscopic table. After routine prep and drape, a contrast venogram was obtained through the left upper extremity demonstrating patency of the course with the extrathoracic left subclavian vein.  With this data, we then administered local anesthesia just caudal to the previous incision line, carried it down to the layer of device pocket, explanted the device and dissected cephalad about an inch or so.  We then obtained access to the extrathoracic left subclavian vein and through this, passed a St. Jude 7121 65-cm active fixation dual coil defibrillator lead serial #[id number].  Under fluoroscopic guidance, it was made to the right ventricular apex with bipolar R wave was 9.2 with a pace impedance of 822 with threshold 0.6-0.5, current threshold 0.8 mA.  There is no diaphragmatic pacing at 10 volts and the current of injury was brisk.  This lead was secured to the prepectoral fascia and then previously implanted.  LV lead was checked and a LV tip RV coil configuration with an amplitude of 4.8 impedance of 547, threshold  of 0.6-0.5, and current of threshold of 1.2 mA.  The atrial lead was checked.  This was a [id number], serial [id number].  The amplitude was 2.9 with a pace impedance of 473, threshold 0.5 and 0.5, current of threshold 1.1 mA.  Heme discoloration was noted over at least 6 inches of the left ventricular lead, so it was dissected from the floor as far as could be obtained and it was then sealed the medical adhesive for the entire exposed lead about 6-8 inches.  The leads were then attached to a Medtronic [id number] pulse generator serial #[id number].  Through the device, the amplitudes were not recorded.  The RA impedance was 482 with threshold of 1 V of 0.4.  the RV impedance was 646, threshold 0.7 and 0.4, and the LV impedance was 475, with threshold 0.75 and 0.4.  With these acceptable parameters recorded, defibrillation threshold testing was undertaken.  Ventricular fibrillation was induced via the T-wave shock after total duration of 8 seconds at 20 joules shock was delivered through a measured resistance of 48 ohms terminating ventricular fibrillation restoring sinus rhythm.  The device was implanted.  The pocket was copiously irrigated with antibiotic containing saline solution.  Hemostasis was assured.  Leads and pulse generator were placed in the pocket.  The wound was then closed in three layers in  normal fashion.  I should note that Surgicel was placed at the cephalad and the caudal aspects of the pocket where we had the most dissection. The wound was then closed in three layers in normal fashion.  The wound was washed, dried, and a benzoin Steri-Strip dressing was applied.  Needle counts, sponge counts, and instrument counts were correct at the end of procedure according to staff.  The patient tolerated the procedure without apparent complication.     Duke Salvia, MD, [facility]     SCK/MEDQ  D:  03/06/2011  T:  03/07/2011  Job:  [id number]  Electronically Signed by Sherryl Manges MD  [employer] on 03/12/2011 08:50:50 AM

## 2011-03-12 NOTE — Telephone Encounter (Signed)
Pt states his BP has been being elevated 175/104 this am and 168/96 this pm.  He took extra Lisinopril today but it hasn't really helped much.  THe medications he is taking that would effect his BP are Lisinopril 20 mg once daily, Furosemide 40 mg once daily (medication list states bid but pt says he only takes it once a day because it gives him gout) Coreg 80 mg daily.  Pt does have infection in his left hand and is being treated for it with Septra (just started it yesterday).  The infection and swelling is causing him a great deal of pain and pt feels this may be why his BP is elevated.  Pt is aware I will discuss with Dr Antoine Poche and call him with any changes in his medications and/or need for lab work

## 2011-03-12 NOTE — Telephone Encounter (Signed)
Pt wanted to let nurse know bp this am was 175/104 does he need to double his bp med?

## 2011-03-14 NOTE — Telephone Encounter (Signed)
Pt aware to take Lisinopril 40 mg daily for BP if BP still elevated.  Pt states last night his BP was 136/77 and his pain is better.  He doesn't think he will need to take the extra 20 mg but will if BP goes back up.

## 2011-03-14 NOTE — Telephone Encounter (Signed)
He can start lisinopril 40 mg daily for now.  If pain resolves and BP comes down he can go back to the previous dose.

## 2011-03-19 ENCOUNTER — Ambulatory Visit (INDEPENDENT_AMBULATORY_CARE_PROVIDER_SITE_OTHER): Payer: Medicare Other | Admitting: *Deleted

## 2011-03-19 DIAGNOSIS — I4891 Unspecified atrial fibrillation: Secondary | ICD-10-CM

## 2011-03-19 DIAGNOSIS — I2589 Other forms of chronic ischemic heart disease: Secondary | ICD-10-CM

## 2011-03-19 DIAGNOSIS — I5022 Chronic systolic (congestive) heart failure: Secondary | ICD-10-CM

## 2011-03-19 DIAGNOSIS — I255 Ischemic cardiomyopathy: Secondary | ICD-10-CM

## 2011-03-19 NOTE — Progress Notes (Signed)
icd check in clinic  

## 2011-04-01 NOTE — Discharge Summary (Signed)
[name], BATCH NO.:  [id number]   MEDICAL RECORD NO.:  [id number]          PATIENT TYPE:  INP   LOCATION:  3730                         FACILITY:  [facility]   PHYSICIAN:  [name] Sidle, MD DATE OF BIRTH:  [birth year]   DATE OF ADMISSION:  07/12/2008  DATE OF DISCHARGE:  07/15/2008                               DISCHARGE SUMMARY   PRIMARY CARDIOLOGIST:  [name] Rotunda, MD, [facility], as well as Macarthur Critchley.  [name] Majestic, MD.   PRIMARY CARE [name]:  Dr. Nadine [name].   DISCHARGE DIAGNOSIS:  Acute-on-chronic systolic congestive heart  failure.   SECONDARY DIAGNOSES:  1. Coronary artery disease, status post Cypher drug-eluting stent      placement in the left circumflex in 2003 with a known occluded      right coronary artery.  2. Predominantly nonischemic cardiomyopathy with an ejection fraction      of 25% by echo this admission.  3. History of atrial flutter status post cardioversion.  4. History of interstitial lung disease.  5. Gout.  6. Chronic underlying left bundle-branch block.  7. Status post bilateral ventricular implantable cardioverter-      defibrillator.  8. History of seizure disorder.  9. Chronic kidney disease.   ALLERGIES:  CODEINE, PENICILLIN, PRILOSEC, NORVASC, FLAGYL,  AZITHROMYCIN, LATEX.   PROCEDURES:  A 2-D echocardiogram revealing an ejection fraction of 25%.   HISTORY OF PRESENT ILLNESS:  A 65 year old male with prior history of  chronic systolic heart failure and predominantly nonischemic  cardiomyopathy, although he does have a history of coronary artery  disease.  He is status post BiV ICD placement.  He is apparently in his  usual state of health until July 12, 2008, when he presented to the  [facility] Emergency Room with sudden onset of dyspnea.  The chest x-ray  was suggestive of pulmonary edema, and the patient required intubation.  He was placed on intravenous Lasix and also IV nitroglycerin for blood  pressure reduction  and transferred to [facility] for further evaluation.   HOSPITAL COURSE:  Upon arrival, the patient was taken to the Coronary  Intensive Care Unit.  He remained intubated and Pulmonary and Critical  Care Medicine were consulted for ventilator management.  He was  maintained on intravenous Lasix and was able to be extubated by early  afternoon on July 12, 2008.  He did have mild elevation of his  troponin to a peak of 0.29, and his CK-MB was only as high as 5.9.  It  was felt that his enzyme rise was likely secondary to heart failure  rather than ischemia, and it was not felt that he required cardiac  catheterization during this admission.  With continued diuresis, the  patient has symptomatically improved.  He has been ambulating without  difficulty.  We did obtain a 2-D echocardiogram revealing an EF of 25%  up from 15-20% in 2007.  We have adjusted his home doses of diuretics  and have also initiated Digoxin therapy, and we will plan on discharge  today.   DISCHARGE LABS:  Hemoglobin 13.5, hematocrit 39.9, WBC 10.1,  platelets  177, MCV 101.1, sodium 139, potassium 4.1, chloride 105, CO2 29, BUN 30,  creatinine 1.50, glucose 111, CK 309, CK-MB 3.8, troponin I 0.19, total  cholesterol 183, triglycerides 73, HDL 34, LDL 134, calcium 8.8, TSH  0.958.   DISPOSITION:  The patient will be discharged to home today in good  condition.   FOLLOWUP PLANS AND APPOINTMENTS:  We will arrange for followup with Dr.  Antoine [name] or Heart Failure Clinic in 1-2 weeks.  He is asked to follow up  with Dr. Harrison [name] as previously scheduled.   DISCHARGE MEDICATIONS:  1. Aspirin 81 mg daily.  2. Lasix 40 mg 2 tabs daily.  3. Digoxin 0.125 mg every other day.  4. Coreg CR 80 mg daily.  5. Lisinopril 20 mg daily.  6. Simvastatin 10 mg at bedtime.  7. Protonix 40 mg daily.  8. K-Dur 20 mEq daily.  9. Flexeril 5 mg t.i.d.  10.Colchicine 0.6 mg p.r.n. gout flare.  11.Nitroglycerin 0.4 mg sublingual p.r.n.  chest pain.   OUTSTANDING LABS AND STUDIES:  None.   DURATION OF DISCHARGE ENCOUNTER:  Forty minutes including physician  time.      [name], ANP      [name] Sidle, MD  Electronically Signed    CB/MEDQ  D:  07/15/2008  T:  07/16/2008  Job:  [id number]   cc:   [name]

## 2011-04-01 NOTE — Discharge Summary (Signed)
[name], [name]                ACCOUNT NO.:  [id number]   MEDICAL RECORD NO.:  [id number]          PATIENT TYPE:  INP   LOCATION:  2008                         FACILITY:  [facility]   PHYSICIAN:  [name], MD, FACCDATE OF BIRTH:  10-07-1946   DATE OF ADMISSION:  04/30/2009  DATE OF DISCHARGE:  05/02/2009                               DISCHARGE SUMMARY   PROCEDURE:  Two-view chest x-ray.   PRIMARY FINAL DISCHARGE DIAGNOSIS:  Hyponatremia.   SECONDARY DIAGNOSES:  1. Chronic systolic congestive heart failure.  2. History of chronic kidney disease with BUN 18, creatinine 0.98, and      GFR greater than 60 this admission.  3. History of chronotropic incompetence, status post Medtronic Sentry      CRT device.  4. History of pulmonary edema.  5. Status post cardiac catheterization in 2003 with circumflex 75%,      treated with a Cypher drug-eluting stent, right coronary artery      totaled with left-to-right collaterals and no other disease.  6. History of atrial flutter, status post cardioversion.  7. History of interstitial lung disease.  8. Gout.  9. Left bundle-branch block.  10.History of seizure disorder.  11.Allergy or intolerance to CODEINE, PENICILLIN, PRILOSEC, NORVASC,      FLAGYL, AZITHROMYCIN, LATEX, and MAXZIDE.  12.Family history of coronary artery disease in both parents.  13.History of ventilator-dependent respiratory failure secondary      pulmonary edema in August 2009.   TIME AT DISCHARGE:  42 minutes.   HOSPITAL COURSE:  [name] is a 65 year old male with known coronary  artery disease.  He has been followed closely by Dr. Antoine Poche for his  volume status and was found to be hyponatremic.  Despite medication  adjustments, his sodium continued to drop, so he was brought into the  hospital for further evaluation and treatment.   His sodium was 123 on admission.  It was felt it is possibly secondary  to his diuretic, which was Demadex, so this was held  and his sodium  improved to 131 by discharge.  He had some elevation in his blood  sugars, both fasting and nonfasting with a low of 110 and a high of 137.  He is to follow up with his family physician.  A BNP was approximately  200, and TSH was within normal limits.  Urine osmolality was 343 and  urine sodium was 24.  Serum osmolality was low at 270.  He is on  Coumadin, but his INR was subtherapeutic.  He was placed on nebulizers  because of a cough, but a chest x-ray showed no acute cardiopulmonary  findings, so these will be not be discharge medications.  He was started  on Ultram and Tessalon Perles for the cough with some improvement.   By May 02, 2009, [name]' condition was significantly improved.  He  is anemic with a hemoglobin of 10.5 and hematocrit of 30.2 on June 10,  10.3 and 30.2 at discharge.  His MCV is 102.6.  Reticulocyte count,  vitamin B12 level, iron, folic acid, and ferritin levels are pending.  He is to use stool guaiac cards and follow up with his family physician.  On May 02, 2009, Dr. Antoine Poche evaluated [name].  He was considered  stable for discharge with close outpatient followup.   DISCHARGE INSTRUCTIONS:  1. His activity level is to be increased gradually.  2. He is to weigh himself daily.  3. He is to stick to a heart-healthy diet.  4. He is to follow up with Dr. Antoine Poche on June 28 at 9 a.m. and with      Dr. Harrison Mons as needed.   DISCHARGE MEDICATIONS:  1. Coreg CR 80 mg daily.  2. Lisinopril 20 mg daily.  3. Demadex is stopped.  4. Digoxin 0.125 mg on every other day.  5. Klor-Con is discontinued.  6. Nitroglycerin sublingual p.r.n.  7. Aspirin 81 mg daily.  8. Colchicine 0.6 mg daily.  9. Methylprednisolone is stopped.  10.Coumadin as prior to admission.  11.Lasix 20 mg a day.  12.Tessalon Perles 100 mg daily.  13.Ultram 50 mg b.i.d. p.r.n.      Theodore Demark, [name]      [name], MD, [facility]  Electronically Signed     RB/MEDQ  D:  05/02/2009  T:  05/03/2009  Job:  [id number]   cc:   Nadine Counts

## 2011-04-01 NOTE — Assessment & Plan Note (Signed)
[facility]                         ELECTROPHYSIOLOGY OFFICE NOTE   NAME:Deatley, ONEY FOLZ                       MRN:          [id number]  DATE:08/22/2008                            DOB:          [birth year]    Mr. Febo was seen a month after he saw Dr. Antoine Poche which was a follow  up to a hospitalization for acute on acute on chronic heart failure.   It should be noted that he had atrial fibrillation in August.  He is  doing better.  He has some complaints of a sore throat.  His energy  level is getting better.  His major complaint now is muscle cramps.  His  uric acid was checked and was elevated.  Dr. Shelva Majestic is going to recheck  this in a couple of weeks.   His other medications include:  1. Coreg 80.  2. Lisinopril 28.  3. Aspirin.  4. Flexeril.  5. Digoxin 0.125 every other day.  6. Lasix 40.   On examination, his blood pressure is mildly elevated at 141/83 with a  pulse of 76 and the weight was 186 which is stable.  His lungs were  clear.  His neck veins were flat.  Heart sounds were regular with a 2/6  murmur.  The abdomen was soft.  The extremities had no edema.   Interrogation of his Medtronic device demonstrates a P-wave of 1.8 with  impedance of 444, threshold of 1 V at 0.2 which is true in the RV and  the LV.  The RV amplitude was 16.6, impedance was 520.  The LV impedance  was 480.  There were no intercurrent therapies.   IMPRESSION:  1. Congestive heart failure - chronic - systolic.  2. Ischemic heart disease.  3. Previously implanted implantable cardioverter-defibrillator with a      6949 lead.  4. Leg cramps.   From a heart failure point of view, Mr. Dupuis is doing pretty well.   We will plan to check his magnesium today.  His lipids and his uric acid  will be followed up by Dr. Shelva Majestic.   We will see him again in 3 months' time in this clinic.     Duke Salvia, MD, [facility]  Electronically Signed    SCK/MedQ  DD:  08/22/2008  DT: 08/22/2008  Job #: [id number]   cc:   Macarthur Critchley. Shelva Majestic, M.D.

## 2011-04-01 NOTE — Assessment & Plan Note (Signed)
[facility]                            CARDIOLOGY OFFICE NOTE   NAME:Mchan, GADGE HERMIZ                       MRN:          [id number]  DATE:09/18/2008                            DOB:          Nov 28, 1945    PRIMARY CARE PHYSICIAN:  Dr. Nadine Counts.   REASON FOR PRESENTATION:  Evaluate the patient with systolic heart  failure.   HISTORY OF PRESENT ILLNESS:  The patient is a pleasant 65 year old  gentleman with mixed ischemic and nonischemic cardiomyopathy.  Since I  last saw him, he has done well.  He has been staying on top of his  weight and diuresing himself, if he gains a few pounds.  He watches his  salt.  He takes his medications.  He is having no progressive dyspnea.  He denies any PND or orthopnea.  He has had no chest discomfort, neck or  arm discomfort.  He recently had a basal cell cancer removed from his  left upper arm.   PAST MEDICAL HISTORY:  Ischemic cardiomyopathy (EF 25%), atrial flutter  status post TEE guided cardioversion, nonsustained ventricular  tachycardia, chronic left bundle-branch block, chronic interstitial lung  disease, acute on chronic renal insufficiency, coronary artery disease  (total occlusion of the right coronary artery, left-to-right  collaterals.  He had Cypher stenting to the circumflex in July 2003),  seizure disorder, multiple chemical allergies, status post implantation  of a Medtronic cardioverter-defibrillator/CRT.   ALLERGIES:  PENICILLIN, PRILOSEC, NEOMYCIN, CODEINE, AMOXICILLIN,  CLARITIN, FLAGYL, LATEX, and CORZIDE.   MEDICATIONS:  1. Carvedilol CR 80 mg.  2. Lisinopril 20 mg daily.  3. Aspirin 81 mg daily.  4. Potassium 20 mEq daily.  5. Flexeril.  6. Digoxin 0.125 mg every other day.  7. Lasix 40 mg daily.   REVIEW OF SYSTEMS:  As stated in the HPI and otherwise negative for  other systems.   PHYSICAL EXAMINATION:  GENERAL:  The patient is in no distress.  VITAL SIGNS:  Blood pressure  142/83, heart rate 78 and regular, weight  186 pounds, body mass index 25.  NECK:  No jugular venous distention at 45 degrees, carotid upstroke  brisk and symmetrical.  No bruits, no thyromegaly.  LYMPHATICS:  No cervical, axillary, or inguinal adenopathy.  LUNGS:  Clear to auscultation bilaterally.  BACK:  No costovertebral angle tenderness.  CHEST:  Well-healed ICD pocket.  HEART:  PMI not displaced or sustained, S1 and S2 within normal.  No S3,  no S4, no clicks, no rubs, no murmurs.  ABDOMEN:  Flat, positive bowel sounds, normal in frequency and pitch, no  bruits, no rebound, no guarding, no midline pulsatile mass, no  hepatomegaly, no splenomegaly.  SKIN:  No rashes, no nodules.  EXTREMITIES:  2+ pulse, no edema, no cyanosis, no clubbing.  NEUROLOGIC:  Oriented to person, place, and time, cranial nerves II-XII  are grossly intact, motor grossly intact.   ASSESSMENT AND PLAN:  1. Cardiomyopathy.  The patient seems to have class I symptoms at this      point.  He is well at tune to how to dose his  Lasix.  He is      avoiding salt.  He will remain on the other medicines as listed.  2. Coronary artery disease.  We will continue him with secondary risk      reduction.  No further cardiovascular testing is suggested.  3. Status implantable cardioverter defibrillator.  He has had this      followed by Dr. Graciela Husbands and is up-to-date.  4. Dyslipidemia per Dr. Shelva Majestic and Harrison Mons.  5. Followup to see the patient back in March, staggering his      appointments with Dr. Shelva Majestic.  He can come back to see me sooner      if there are any acute problems.     [name], MD, [facility]  Electronically Signed    JH/MedQ  DD: 09/18/2008  DT: 09/19/2008  Job #: [id number]   cc:   Nadine Counts

## 2011-04-01 NOTE — Assessment & Plan Note (Signed)
[facility]                         ELECTROPHYSIOLOGY OFFICE NOTE   NAME:Batt, SYE SCHROEPFER                       MRN:          [id number]  DATE:09/14/2007                            DOB:          08-Aug-1946    HISTORY:  Mr. Hoak comes in.  He has congestive heart failure.  He is  status post CRT implantation.  He is concerned about weight gain and  fluid retention.   He had talked to Dr. [name] last month about beta natriuretic  peptide levels and they spent an hour discussing the fact that Dr.  Antoine Poche really did not think that he needed one.  He remains concerned.   CURRENT MEDICATIONS:  1. Coreg 25 mg twice daily.  2. Lisinopril 20 mg daily.   PHYSICAL EXAMINATION:  VITAL SIGNS:  Blood pressure 138/77, pulse 59.  LUNGS:  Clear.  HEART:  Sounds regular.  NECK:  Veins were flat.  EXTREMITIES:  Without edema.  ABDOMEN:  No hepatojugular reflux.  SKIN:  Warm and dry.   Interrogation of his Medtronic Sentry CRT device demonstrates a P-wave  of 2.5 and an impedance of 616, threshold of 1 volt at 0.2.  In all  three chambers the R-wave was 19.6, with an RV impedance of 480 and an  LV impedance of 528, battery voltage of 3.11.  There are no inter-  current therapies.  Heart rate excursion was strikingly flat.   IMPRESSION:  1. Cardiomyopathy.  2. Chronotropic incompetence.  3. Congestive heart failure - systolic - chronic, without evidence of      fluid overload but increasing weight.   PLAN:  Will plan to activate his rate response.  I will also check a  beta natriuretic peptide today.  He is to call us in 48 hours to let us  know how he is feeling.  Will give him the results of his beta  natriuretic peptide at that time.     Duke Salvia, MD, [facility]  Electronically Signed   SCK/MedQ  DD: 09/14/2007  DT: 09/14/2007  Job #: [id number]   cc:   Macarthur Critchley. Shelva Majestic, M.D.

## 2011-04-01 NOTE — Assessment & Plan Note (Signed)
[facility]                            CARDIOLOGY OFFICE NOTE   NAME:[name], OHN BOSTIC                       MRN:          [id number]  DATE:07/31/2008                            DOB:          [birth year]    PRIMARY CARE PHYSICIAN:  Dr. Nadine Counts.   REASON FOR PRESENTATION:  Evaluate the patient for recent  hospitalization for acute on chronic systolic congestive heart failure.   HISTORY OF PRESENT ILLNESS:  The patient was hospitalized on July 12, 2008, with sudden onset of dyspnea.  He had pulmonary edema, required  intubation.  He was quickly diuresed and extubated.  He did have a very  slight troponin elevation.  However, he did not have any chest pain.  He  had had some salt in his diet prior to this.  In retrospect, his wife  had noticed some heavy breathing.  However, his acute episode happened  rather suddenly.  Since that time, he has had no further dyspnea.  He  has had no PND or orthopnea.  He has had no chest discomfort, neck, or  arm discomfort.  He has had no palpitation, presyncope, or syncope.  He  has had some leg pain.  He thought, may be he was having some flares of  gout.  This has really been in both legs, but the left predominantly.  He took some colchicine with some improvement, but developed diarrhea.  He is wondering if his higher dose of Lasix might be contributing to  this discomfort.   PAST MEDICAL HISTORY:  1. Ischemic cardiomyopathy (EF about 25%).  2. Atrial flutter, status post TEE-guided cardioversion.  3. Nonsustained ventricular tachycardia.  4. Chronic left bundle branch block.  5. Chronic interstitial lung disease.  6. History of acute renal insufficiency.  7. Coronary artery disease (total occlusion of the right coronary      artery, left to right collaterals and Cypher stenting to his      circumflex in July 2003).  8. Seizure disorder.  9. Multiple chemical allergies.  10.Status post implantation of  Medtronic cardioverter      defibrillator/CRT.   ALLERGIES/INTOLERANCES:  1. PENICILLIN.  2. PRILOSEC.  3. NEOMYCIN.  4. CODEINE.  5. AMOXICILLIN.  6. CLARITIN.  7. FLAGYL.  8. LATEX.  9. CORZIDE.   MEDICATIONS:  1. Coreg CR 80 mg daily.  2. Lisinopril 20 mg daily.  3. Aspirin 81 mg daily.  4. Lasix 80 mg daily.  5. Potassium 20 mEq daily.  6. Flexeril.  7. Digoxin every other day.  8. Cephalexin 500 mg t.i.d.   REVIEW OF SYSTEMS:  As stated in the HPI and otherwise negative for  other systems.   PHYSICAL EXAMINATION:  GENERAL:  The patient is in no distress.  VITAL SIGNS:  Blood pressure 115/70, heart rate 64 and regular.  HEENT:  Unremarkable.  Pupils equal, round, reactive to light, fundi not  visualized, oral mucosa unremarkable.  NECK:  No jugular venous distention at 45 degrees, carotid upstroke  brisk and symmetrical, no bruits, no thyromegaly.  LYMPHATICS:  No adenopathy.  LUNGS:  Clear to auscultation bilaterally.  BACK:  No costovertebral angle tenderness.  CHEST:  Unremarkable.  HEART:  PMI not displaced or sustained.  S1 and S2 within normal limits.  No S3, no S4, no clicks, no rubs, no murmurs.  ABDOMEN:  Flat, positive bowel sounds, normal in frequency and pitch.  No bruits, no rebound, no guarding.  No midline pulsatile mass, no  hepatomegaly, no splenomegaly.  SKIN:  No rashes, no nodules.  EXTREMITIES:  Pulses 2+, no edema, no cyanosis, no clubbing.  NEUROLOGIC:  Oriented to person, place, and time, cranial nerves II-XII  grossly intact, motor grossly intact.   EKG, atrial ventricular paced rhythm.   ASSESSMENT AND PLAN:  1. Ischemic cardiomyopathy.  The patient had an acute exacerbation of      heart failure leading to his hospitalization.  He is now euvolemic.      He is going to be even more vigilant about avoiding salt.  He may      be having increasing leg discomfort related to his higher dose of      diuretics.  I am going to increase his  Lasix back to 40 mg a day,      but to continue his potassium at the current dose.  He is going to      go in about 3 days and get a BMET.  We will also check a uric acid      level at that time.  2. Coronary disease.  He had a slight troponin elevation.  However, we      felt this to be a type 2 MI (supply demand mismatch).  Therefore,      he manages medically and he has had no ongoing symptoms.  He will      continue with the risk reduction.  3. Status post implantable cardioverter-defibrillator, we will have      this followed by Dr. Graciela Husbands.  4. Dyslipidemia.  This has been followed by Dr. Shelva Majestic.  He remains      on his current regimen.      He has not wanted statins in the past.  5. Followup.  I will see him back in about 6 weeks or sooner.     [name], MD, [facility]  Electronically Signed    JH/MedQ  DD: 07/31/2008  DT: 08/01/2008  Job #: [id number]   cc:   Nadine Counts

## 2011-04-01 NOTE — Assessment & Plan Note (Signed)
[facility]                            CARDIOLOGY OFFICE NOTE   NAME:[name]                       MRN:          [id number]  DATE:02/01/2008                            DOB:          [birth year]    PRIMARY:  Dr. Nadine Counts.   REASON FOR PRESENTATION:  Evaluate the patient for ischemic  cardiomyopathy.   HISTORY OF PRESENT ILLNESS:  Patient is 65 years old.  He presents for  followup of the above.  Since I last saw him he has followed with Dr.  Shelva Majestic routinely.  He periodically gets volume overloaded with some  abdominal swelling or increased weight or dyspnea.  Dr. Shelva Majestic follows  weights and BNP levels.  He manages him with higher doses of diuretics  for short course and usually comes back into euvolemic state fairly  easily.  He may be able to relate some of his episodes to dietary  indiscretions.  He has had no severe symptoms to include PND or  orthopnea.  Not describing any chest pain.  Had no palpitations,  presyncope or syncope.   PAST MEDICAL HISTORY:  Ischemic cardiomyopathy (EF had been 15%.  Most  recently was proximal 35%), atrial flutter status post TEE guided  cardioversion, nonsustained ventricle tachycardia, chronic left bundle  branch block, chronic interstitial lung disease, history acute renal  insufficiency, coronary artery disease (total occlusion the right  coronary artery with left-to-right collaterals), CYPHER stenting of  circumflex July 2003, seizure disorder, multiple chemical allergies,  status post implantation of Medtronic cardioverter-defibrillator/CRT.   ALLERGIES:  Intolerance PENICILLIN, PRILOSEC, NEOMYCIN, CODEINE,  AMOXICILLIN, CLARITIN, FLAGYL, LATEX and CORZIDE.   MEDICATIONS:  1. Coreg CR 80 mg daily.  2. Lisinopril 20 mg daily.  3. Aspirin 81 mg daily.  4. Lasix 40 mg 2 times a week.  5. Potassium 20 mg 3 times a week.  6. Co-Q10.  7. Fish oil.  8. Multivitamin.  9. Saw palmetto.  10.Green tea.   REVIEW OF SYSTEMS:  As stated in the HPI otherwise have other systems.   PHYSICAL EXAMINATION:  The patient is in no distress.  Blood pressure 148/80, heart rate 66 regular, weight 193 pounds.  NECK:  No jugular venous distension at 45 degrees, carotid upstroke  brisk and symmetric, no bruits, thyromegaly.  LYMPHATICS:  No adenopathy.  LUNGS:  Clear to auscultation bilaterally.  BACK:  No costovertebral angle tenderness.  CHEST:  Unremarkable.  HEART:  PMI not displaced or sustained, S1 and S2 within normal limits,  no S3, no S4, no clicks, rubs, murmurs.  ABDOMEN:  Flat, positive bowel sounds, normal in frequency and pitch, no  bruits, rebound, guarding.  No midline pulsatile mass, hepatomegaly,  splenomegaly.  SKIN:  No rashes, no nodules.  EXTREMITIES:  With 2+ pulses throughout, no edema, cyanosis, clubbing.    EKG sinus rhythm with ventricular pacing 100% capture.   ASSESSMENT/PLAN:  1. Cardiomyopathy.  The patient is doing very well.  He is followed      very closely by Dr. Shelva Majestic.  I offered to see him in this clinic  only p.r.n. but he wants to come back at least once a year.  For      now, he will continue medications as listed.  No further      cardiovascular testing is suggested.  2. Chest discomfort.  He is having no ongoing symptoms.  He is      participating in secondary risk reduction.  He is not on any      statins but he has been very intolerant of medications and has not      wanted these in the past.  I will defer to Dr. Shelva Majestic.  3. Status post ICD followed by Dr. Graciela Husbands.  4. Followup.  See the patient again in 1 year or sooner if needed.     [name], MD, [facility]  Electronically Signed    JH/MedQ  DD: 02/01/2008  DT: 02/01/2008  Job #: [id number]   cc:   Nadine Counts

## 2011-04-01 NOTE — Assessment & Plan Note (Signed)
[facility]                            CARDIOLOGY OFFICE NOTE   NAME:[name]                       MRN:          [id number]  DATE:07/26/2008                            DOB:          11-Sep-1946    PRIMARY CARE PHYSICIAN:  Dr. Nadine Counts.   CARDIOLOGIST:  [name], MD, [facility]   INTERVAL HISTORY:  [name] is a 65 year old male with a history of  congestive heart failure secondary to nonischemic cardiomyopathy with an  EF of 25% status post ICD.  He also has a history of interstitial lung  disease.  I admitted him about 2 weeks ago to the hospital with acute on  chronic heart failure.  He was intubated at [facility] and transferred  here.  He was diuresed and did quite well.  He presents today for an  unscheduled visit due to some redness on his left arm.  I have been  asked to evaluate that.   He denies any fevers or chills.  He states his arms are a little bit  tender.   PHYSICAL EXAMINATION:  He is afebrile.  He ambulates around the clinic  without any respiratory difficulty.  Vital signs are otherwise stable.  On his left arm near the antecubital fossa, there is about 2-inch area  of induration at a previous IV site.  It is mildly erythematous, there  is no streaking.  Sensation is intact.  There is no fluctuance or  oozing.   ASSESSMENT AND PLAN:  I suspect, this is primary induration due to an  infiltrate, but he may also have some overlying cellulitis given the  fact that it is now 2 weeks out and has not resolved.  We will go ahead  and put him on Keflex 500 mg t.i.d.  He does have remote PENICILLIN  allergy, but this does not appear severe, as the cross reactivity is  only 10%, I think we are safe using Keflex.  I told him to watch very  closely for any streaking or fever.  If this develops, he will need to  be further evaluated in consideration for therapy for MRSA.  We will  continue his outpatient antibiotics for 7 days.   He can follow up with  his primary care physician as needed.     Bevelyn Buckles. Bensimhon, MD  Electronically Signed    DRB/MedQ  DD: 07/26/2008  DT: 07/27/2008  Job #: [id number]

## 2011-04-01 NOTE — H&P (Signed)
NAMEDETAVIOUS, RINN                ACCOUNT NO.:  [id number]   MEDICAL RECORD NO.:  [id number]          PATIENT TYPE:  INP   LOCATION:  2901                         FACILITY:  [facility]   PHYSICIAN:  Bevelyn Buckles. Bensimhon, MDDATE OF BIRTH:  22-Jul-1946   DATE OF ADMISSION:  07/12/2008  DATE OF DISCHARGE:                              HISTORY & PHYSICAL   PRIMARY CARE PHYSICIAN:  Dr. Nadine Counts.   CARDIOLOGIST:  Dr. Harland Dingwall and Dr. [name].   REASON FOR ADMISSION:  Flash pulmonary edema.   PATIENT INDICATION:  [name] is a 65 year old male with a history  congestive heart failure secondary to primary nonischemic cardiomyopathy  with ejection fraction of previously 15%, but most recently in the 35%  range.  He is status post Bi-V ICD.  He does have moderate coronary  artery disease as well as a flutter status post previous cardioversion  and interstitial lung disease and gout.   The patient has arrived from [facility], intubated.  There is no family available to provide further history.  According to  the EMS and ER at [facility], he apparently developed sudden onset of  shortness of breath tonight.  There was no antecedent chest pain.  This  was not progressive.  There is no fevers or chills.  He went to [facility]  emergency room and was found to have a florid pulmonary edema.  He was  in significant respiratory distress.  He was initially tried with IV  Lasix and IV nitroglycerin, but this did not work abruptly, so he was  intubated and transferred here.  He has now had good urine output about  1 liter out.  His FIO2 is gone from 100% to 40%.   REVIEW OF SYSTEMS:  Unavailable except as was provided in the HPI and  problem list.   PROBLEM LIST:  1. Chronic systolic heart failure secondary to predominately      nonischemic cardiomyopathy.      a.     EF previously 15%, more recently reportedly 35%.      b.     Status post Medtronic Bi-V ICD.  2.  Coronary artery disease.  Last cardiac catheterization on computer      July 2003, EF of 38%, left main was normal, LAD was okay, left      circumflex was a large codominant vessel the proximal 75% lesion      which was stented with a Cypher drug-eluting stent.  The RCA was      small codominant vessel which was totally occluded.  There were      left-to-right collaterals.  3. History of a flutter status post cardioversion.  4. History of interstitial lung disease.  5. History of gout.  6. Underlying left bundle branch block.  7. History of seizure disorder.   CURRENT MEDICATIONS:  This is a little bit hard to tease out as his list  has several different notes on it.  From what I can gather, he is on:  1. Coreg CR 80 mg a day.  2. Lisinopril 20 a  day.  3. Lasix 80 mg reportedly three times a week.  4. Potassium 20 mEq p.r.n.  5. Aspirin 81 mg occasionally.  6. He was previously on Coumadin and digoxin, but these were      discontinued for unclear reasons.  7. Supplements are fish oil, multivitamin, green tea, saw palmetto,      Echinacea, vitamin, valerian.   ALLERGIES:  CODEINE, PENICILLIN, PRILOSEC, NORVASC, FLAGYL,  AZITHROMYCIN, LATEX AND AZITHROMYCIN.  MAXZIDE CAUSES HIM GOUT.   SOCIAL HISTORY:  According to the computer, he is married.  He lives in  [place] with his wife.  He is disabled.  He does not smoke.   FAMILY HISTORY:  Both parents had heart disease in their 83s.   PHYSICAL EXAMINATION:  GENERAL:  He is intubated and sedated.  He is a  restrained.  VITAL SIGNS:  His blood pressure is 115/71 with a heart rate of 60.  He  is sating 99% of 40% FIO2.  HEENT:  Normal except for poor dentition.  NECK:  Supple.  JVP is mildly elevated at 8-9 cm of water.  Carotids are  1+ bilaterally.  No lymphadenopathy or thyromegaly appreciated.  CARDIAC:  PMI is laterally displaced.  No obvious murmur.  He is  regular.  There is no S3.  LUNGS:  Clear anteriorly.  ABDOMEN:   Soft, nontender, nondistended.  No hepatosplenomegaly, no  bruits.  No masses appreciated.  EXTREMITIES:  There is no cyanosis or clubbing.  They are cool.  No  edema.  Distal pulses are 1+.  NEUROLOGICAL:  He is sedated.  Appears nonfocal.   LABORATORY DATA:  Labs from [place]:  Sodium 140, potassium 4.8,  creatinine 1.5.  Digoxin less than 0.4, INR 1.0.  White count 10.80,  hemoglobin 15.1, platelet 206.  N Terminal BNP is 3860, troponin is  0.02.  EKG shows sinus rhythm with ventricular pacing at a rate of 60.  Chest x-ray apparently showed diffuse congestive heart failure.   ASSESSMENT:  1. Acute on chronic systolic heart failure with flash pulmonary edema.  2. Primarily nonischemic cardiomyopathy with an EF of 35% status post      Bi-V ICD.  3. Ventilatory dependent respiratory failure.  4. Mild renal insufficiency.   PLAN/DISCUSSION:  Mr. Hogeland has been admitted with what appears to be  flash pulmonary edema by report.  There is no evidence of antecedent  ischemic symptoms.  He seems to be responding well to IV diuresis, and  his respiratory status is responding rapidly.  We will continue diuresis  for now.  Can add milrinone as needed.  Will rule out myocardial  infarction with serial cardiac markers.  Check an echocardiogram if his  cardiac markers are elevated or consider cardiac catheterization.  Will  call Pulmonary Critical Care in the morning to help with extubation.   Of note, he was previously on Coumadin.  Not sure why it was stopped.  Will have to look more into this.     Bevelyn Buckles. Bensimhon, MD  Electronically Signed    DRB/MEDQ  D:  07/12/2008  T:  07/12/2008  Job:  [id number]

## 2011-04-04 NOTE — Discharge Summary (Signed)
NAMEQUINTERIOUS, WALRAVEN                ACCOUNT NO.:  [id number]   MEDICAL RECORD NO.:  [id number]          PATIENT TYPE:  INP   LOCATION:  6532                         FACILITY:  [facility]   PHYSICIAN:  Duke Salvia, MD, FACCDATE OF BIRTH:  Mar 02, 1946   DATE OF ADMISSION:  08/26/2006  DATE OF DISCHARGE:  08/27/2006                                 DISCHARGE SUMMARY   THE PATIENT HAS ALLERGIES TO:  1. CODEINE.  2. PENICILLIN.  3. PRILOSEC.  4. NORVASC.  5. FLAGYL.  6. AZITHROMYCIN.  7. LATEX.  8. AMOXICILLIN.   Examination dictation 40 minutes.   PRINCIPAL DIAGNOSES:  1. Discharging from [facility] day one status post implantation      [employer] [facility] SENTRY cardioverter defibrillator with cardiac      resynchronization therapy (defibrillator threshold study less than or      equal to 15 joules).  2. Atrial flutter.      a.     Electrophysiology study, radiofrequency catheter ablation with       completion of CAVO isthmus block, June 29, 2006.      b.     Possible tachycardia-mediated cardiomyopathy.  3. Mixed cardiomyopathy, ejection fraction 15%.      a.     One hundred percent occlusion of the right coronary artery,       status post stenting the left circumflex, July 2003.      b.     Class III chronic systolic congestive heart failure.      c.     Left bundle branch block.   SECONDARY DIAGNOSES:  1. Interstitial lung disease versus bronchiolitis obliterans-organizing      pneumonia.  2. Seizure disorder.  3. History of decompensated congestive heart failure in the setting of      atrial flutter, hospitalized July 2007.   PROCEDURE:  August 26, 2006, implantation of BiV ICD, Dr. Sherryl Manges.   The patient had no postprocedure complications and is currently AV pacing.  The QRS has decreased from 154 milliseconds to 142 milliseconds with BiV  pacing.  His incision shows no evidence of hematoma.  He is alert and  oriented x3, achieving 98% oxygen saturation  on room air.  A chest x-ray  shows that the leads are in appropriate position and he has no pneumothorax.  The device has been interrogated on postprocedure day #1 and all values were  within normal limits.   He has been instructed to take care with his left arm for the next 4 days,  not to drive for the next week, and not to lift anything heavier than 10  pounds for next 4 weeks.   1. He has followup with Dr. Shelva Majestic.  He is to call him for appointment in      3-4 weeks from now.  2. And, he has the following at [facility], [address]      a.     Dr. Antoine Poche, Thursday, October 25 at 9:45 in the morning.      b.  To see Dr. Graciela Husbands, Tuesday, December 29, 2006 at 9:50.   BRIEF HISTORY:  Mr. Sudbury is a 65 year old male.  He has a history of  atrial flutter with rapid ventricular response.  He had decompensation of  congestive heart failure, Class III, in July 2007, in the setting of atrial  flutter.  He is status post atrial flutter ablation, June 29, 2006.  The patient has a mixed cardiomyopathy and there is some thought that he has  a tachycardia-mediated cardiomyopathy in addition to coronary artery  disease.  However, his cardiomyopathy is slightly out of proportion to the  coronary artery disease and he has an ejection fraction of 15%.  This has  not improved since atrial flutter/ablation.  In addition, the patient has  left bundle branch block.  It is felt that he would benefit from  cardioverter defibrillator implantation with left ventricular pacing.  He  agrees to proceed.  The risks, benefits have been described to the patient.  He will present electively   HOSPITAL COURSE:  The patient presented electively on August 26, 2006.  The  device was implanted without difficulty on the same day.  __________  he  discharges postprocedure day #1 with a follow-up as mentioned above.   His medications are now listed below.  1. Coreg 12.5 mg twice  daily.  2. Lisinopril 20 mg daily.  3. Digoxin 0.25 mg daily.  4. Furosemide 40 mg daily.  5. K-Dur 20 mEq daily.  6. Multivitamin daily.  7. Enteric-coated aspirin 81 mg daily.  8. Coumadin 5 mg tablets, one and one half tab daily.  9. Nitroglycerin 0.4 mg sublingually, 1 tablet under the tongue as needed      every 5 minutes x3 doses for chest pain.  10.Benadryl as needed.  11.Fish oil caps 1,000 mg daily.  12.__________  150 mg daily.  13.Green Tea, 1-2 cups daily.  14.Saw Palmetto daily.  15.Valerian as needed.  16.Fluconazole 150 mg daily.  17.Clotrimazole/betamethasone 1% cream topical daily.   LABORATORY:  Pertinent to this admission were taken October 3, white cells  5.1, hemoglobin 12.4, hematocrit 36.9, platelets 305.  His proTime on  October 3, was 21.3, INR 2.8.  Serum electrolytes:  Sodium 134, potassium  4.4, chloride 100, carbonate 29, glucose 82, BUN is 12, creatinine 1.3.     ______________________________  Maple Mirza, PA    ______________________________  Duke Salvia, MD, [facility]    GM/MEDQ  D:  08/27/2006  T:  08/28/2006  Job:  [id number]   cc:   Macarthur Critchley. Shelva Majestic, M.D.  [name], MD, [facility]  Nadine Counts  Coralyn Helling, MD

## 2011-04-04 NOTE — Assessment & Plan Note (Signed)
[facility]                         ELECTROPHYSIOLOGY OFFICE NOTE   NAME:Caselli, KADE DEMICCO                       MRN:          [id number]  DATE:10/15/2006                            DOB:          1946-01-09    [name] returns today for followup visit. A very pleasant man with a  history of congestive heart failure and ischemic cardiomyopathy who has  prior stenting in the past. He has also history of atrial fibrillation  and is on Coumadin. He returns today for followup. His main complaint  today is that of being feeling anxious and unsettled. He denies chest  pain. He is concerned about his ICD insertion site. He notes that he has  pain in his shoulder but denies swelling or fevers or chills. He states  that nothing exacerbates his shoulder pain. He does have somewhat  increased anxiety about his ICD implant.   On exam today, he is a pleasant, well appearing, middle-aged man in no  acute distress. The blood pressure was 130/60, the pulse was 140 and  regular, respirations were 18. Weight was 163 pounds.  NECK:  Revealed no jugular venous distention.  LUNGS:  Were clear bilaterally to auscultation.  CARDIOVASCULAR:  Revealed a regular rate and rhythm with a normal S1 and  S2. IC insertion site demonstrated no hematomas and was nontender with  no erythema.  EXTREMITIES:  Demonstrated no edema.   IMPRESSION:  1. Anxiety.  2. Shoulder pain (positional with new ICD implant).  3. Congestive heart failure.  4. Status post ICD insertion.   DISCUSSION:  I have tried to reassure Mr. Cruzan that his defibrillator  is working normally and that his shoulder discomfort is positional and  not related to any other obvious pathology. There was no evidence of any  infection at his ICD insertion site. He will follow up with Dr. Graciela Husbands.     Doylene Canning. Ladona Ridgel, MD  Electronically Signed    GWT/MedQ  DD: 10/15/2006  DT: 10/16/2006  Job #: [id number]   cc:   Nadine Counts

## 2011-04-04 NOTE — Op Note (Signed)
[name], [name]                          ACCOUNT NO.:  [id number]   MEDICAL RECORD NO.:  [id number]                   PATIENT TYPE:  OIB   LOCATION:  2899                                 FACILITY:  [facility]   PHYSICIAN:  Adolph Pollack, M.D.            DATE OF BIRTH:  08-May-1946   DATE OF PROCEDURE:  DATE OF DISCHARGE:                                 OPERATIVE REPORT   PREOPERATIVE DIAGNOSIS:  Left inguinal hernia.   POSTOPERATIVE DIAGNOSIS:  Indirect left inguinal hernia.   OPERATION/PROCEDURE:  Repair of indirect left inguinal hernia with mesh.   ANESTHESIA:  General plus 0.5% plain Marcaine for local.   INDICATIONS FOR PROCEDURE:  The patient is a 65 year old male with an  increasing bulge becoming painful in the left groin.  On examination, he has  a left inguinal hernia and now presents for repair.  The procedure and the  risks were discussed with him preoperatively.   DESCRIPTION OF PROCEDURE:  In the holding area, the left groin was marked.  He was then brought to the operating room, placed supine on the operating  room table and general anesthetic was administered.  The left groin area was  shaved.  Another mark was made and the left groin area was then sterilely  prepped and draped.  Local anesthetic was infiltrated in the superficial and  deep tissues and an incision was made in the left groin and carried down  through the skin, subcutaneous tissue and Scarpa's fascia until the external  oblique aponeurosis was identified.  More local anesthetic was infiltrated  deep in the external oblique aponeurosis and then the incision was made in  the external oblique aponeurosis.  The fibers of the external oblique  aponeurosis were then split through the external ring medially and up toward  the anterior superior iliac spine laterally.   Using blunt dissection, the shelving edge of the inguinal ligament was  identified inferiorly and the internal oblique muscle and  aponeurosis were  identified superiorly.  The spermatic cord was then isolated and an indirect  sac noted and separated from the cord.  The indirect sac was ligated and  part of it was excised and then it was reduced back through the patulous  internal ring.   Next, a piece of 3 inch x 6 inch mesh was brought into the field, anchored 1  cm medial to the pubic tubercle with 2-0 Prolene suture.  The inferior  aspect of the mesh was then anchored to the shelving edge of the inguinal  ligament with the running 2-0 Prolene suture up to an area of 1 cm lateral  to the internal ring.  A slit was cut in the mesh and it was routed through  on the spermatic cord.  The superior aspect of the mesh was then anchored to  the internal oblique muscle and aponeurosis with interrupted 2-0 Vicryl  sutures.  The  two tails of the mesh were crossed, creating a new internal  ring and these two tails were then anchored to the shelving edge of the  inguinal ligament with a single 2-0 Prolene suture.  The tip of the hemostat  was able to be placed through the new aperture and the cord was mobile.   Next, the lateral aspects of the mesh, silastic mesh was then tucked deep to  the external oblique aponeurosis which was then closed over the mesh with a  running 3-0 Vicryl suture.  More local anesthetic was infiltrated to the  deep and subcutaneous tissues.  Scarpa's fascia was closed with a running 3-  0 Vicryl suture and the skin was closed with a 4-0 Monocryl subcuticular  stitch.  Steri-Strips and sterile dressings were applied.  Needle, sponge,  and instrument counts were reported correct before closure.   He tolerated the procedure well without any apparent complications and was  taken to the recovery room in satisfactory condition.  The plan is for him  to be discharged home with the discharge instructions and Tylox for pain.                                                Adolph Pollack, M.D.     Kari Baars  D:  12/19/2002  T:  12/19/2002  Job:  [id number]   cc:   [name], M.D. [facility]  [address]  [place]  [place] [address]  Fax: [phone]   Nadine Counts  [address]  [place] [address]  Fax: [phone]

## 2011-04-04 NOTE — Discharge Summary (Signed)
NAMECOSMO, TETREAULT                          ACCOUNT NO.:  [id number]   MEDICAL RECORD NO.:  [id number]                   PATIENT TYPE:  INP   LOCATION:  6531                                 FACILITY:  [facility]   PHYSICIAN:  [name], M.D.                DATE OF BIRTH:  05/24/1946   DATE OF ADMISSION:  10/27/2003  DATE OF DISCHARGE:  10/28/2003                                 DISCHARGE SUMMARY   DISCHARGE DIAGNOSES:  1. Admit with presyncope in a setting of new onset left hip and leg pain     possibly secondary to sciatic nerve compression.  Orthostatic blood     pressure was lying 135/78 with heart rate of 72, standing 152/75 with a     heart rate of 80.  Cardiac enzymes were negative x3.  2. History of coronary artery disease with ischemic cardiomyopathy, ejection     fraction of 35%, total occlusion of right coronary artery with left to     right collaterals, status post Cypher to the left circumflex in July of     2003.  3. Cardiolite study in August of 2004 with no evidence of ischemia, but     evidence of previous infarct.  4. Nonsustained ventricular tachycardia, evaluated by Dr. Graciela Husbands.  5. Seizure disorder.  6. Chemical allergies.  7. Anxiety.  8. Hypertension.  9. Multiple allergies.  10.      Untreated dyslipidemia.  11.      History of atrial fibrillation, currently sinus rhythm.  The     patient persistently declines Coumadin.   PROCEDURE:  The patient mentions a CT scan, but I see no evidence of this in  the chart as being ordered and it is not available as yet on the computer.  We will assess this and report to the patient if it is abnormal.   DISPOSITION:  The patient is stable on the morning of discharge, December  11.  He is in sinus rhythm.  He is achieving 99% oxygen saturation on room  air.  He has good urine output, blood pressure is stable at 134/68, pulse is  98 and sinus rhythm.  The patient still is complaining of left hip and  buttock pain, but  declines any analgesic for this at this time.  He has had  no repeat of his presyncope symptoms.  Orthostatic blood pressures are  within normal limits.  Cardiac enzymes are negative.  The plan was for  discharge if both orthostatic studies and cardiac enzymes were negative and  if upon ambulation, the patient did not feel a repetition of his symptoms.   HISTORY OF PRESENT ILLNESS:  The patient had an episode of lightheadedness  on December 10.  He has been bothered for the last 24 hours by left hip and  leg pain, possibly secondary to sciatic nerve compression.  He did not have  much rest on  the evening of December 9.  He was sitting at the table on the  morning of December 10, with his head in his hands.  He put his head up and  felt presyncopal. He denies any overt syncope, chest pain, or palpitations.  He called EMS and when they stood him up from his position on the couch, he  was lightheaded.  He denies any recent orthostatic symptoms.  The patient  has otherwise been feeling relatively well.  The patient will be admitted  and orthostatic studies will be obtained.  Cardiac enzymes cycled.  If these  are negative, the patient will go home if he has no repetition of the  symptoms.   HOSPITAL COURSE:  On the morning of December 11, the patient has felt better  in regard to dizziness.  He still has left hip and buttock pain.  Cardiac  enzymes are as follows:  December at 1829 hours; myoglobin 54.1, CK-MB less  than 1, troponin I less than 0.05, 0.05, and then 0.01.  The patient has  maintained a sinus rhythm and on ambulation did not have repetition of  symptoms, going home with the medications which are metoprolol 25 mg twice a  day, lisinopril 10 mg daily, enteric-coated aspirin 81 mg daily,  multivitamin daily, vitamin E daily, Saw Palmetto, calcium, Coenzyme Q, Fish  Oil capsules, vitamin C, colloidal silver, green tea, ginger, milk thistle,  and olive leaf.  There are no  restrictions on the patient's activity.  The  patient has declined pain management for left hip and left leg pain.   DIET:  Low sodium and low cholesterol diet.   FOLLOW UP:  He is advised to follow up with Dr. Antoine Poche with history of  NSVT. Might be a candidate for MUSTT with ejection fraction decreased at  35%. We will contact him for his next office visit at [facility].      Maple Mirza, P.A.                    [name], M.D.    GM/MEDQ  D:  10/28/2003  T:  10/29/2003  Job:  [id number]   cc:   Nadine Counts  [address]  [place]  [place] [address]  Fax: [phone]

## 2011-04-04 NOTE — Assessment & Plan Note (Signed)
[facility]                              CARDIOLOGY OFFICE NOTE   NAME:Langsam, RETT STEHLIK                       MRN:          [id number]  DATE:09/10/2006                            DOB:          02-10-1946    PRIMARY:  Dr. Nadine Counts.   REASON FOR PRESENTATION:  Evaluate patient with heart failure status post  defibrillator placement.   HISTORY OF PRESENT ILLNESS:  The patient presents for follow up.  He was  hospitalized August 26, 2006, after biventricular defibrillator placed.  He  had been wearing a LifeVest which had fired.  The defibrillator was placed  and, unfortunately, he has one of the recall leads.  This has made him quite  anxious.  However, it did get interrogated today and it looks fine.  He has  had no firings of his device.  He actually thinks that he feels better since  he has had it placed.  He has even noted that his blood pressure has been  up.  He has been feeling a little pounding in his head, wondering if that is  related to his systolics in the 160's.  He has had no shortness of breath.  He denies any PND or orthopnea.  He has no palpitation, no presyncope or  syncope.  He has not yet gotten back to walking but is going to want to  start doing this.  He has had a little discomfort at his defibrillator  pocket.   PAST MEDICAL HISTORY:  1. Interstitial lung disease versus bronchiolitis obliterans organizing      pneumonia.  2. Seizure disorder.  3. Ischemic cardiomyopathy (EF approximately 15%).  4. Atrial flutter status post TEE-guided cardioversion.  5. Nonsustained ventricular tachycardia.  6. Left bundle branch block.  7. Renal insufficiency.  8. Coronary artery disease (Total occlusion of the right coronary with      left to right collaterals.  He had a CYPHER stent placed in the      circumflex in July, 2003).  9. MULTIPLE CHEMICAL ALLERGIES.   ALLERGIES:  1. PENICILLIN.  2. PRILOSEC.  3. E-MYCIN.  4.  CODEINE.  5. AMOXICILLIN.  6. CLARITIN.  7. FLAGYL.  8. LATEX.  9. __________.   MEDICATIONS:  1. Lisinopril 20 mg b.i.d.  2. Digoxin 0.25 mg daily.  3. Coreg 12.5 mg b.i.d.  4. Lasix 40 mg daily.  5. Potassium 20 mEq daily.  6. Aspirin 81 mg daily.  7. Nitroglycerin p.r.n.  8. Coumadin per Dr. Shelva Majestic.  9. Benadryl.   REVIEW OF SYSTEMS:  As stated in the HPI and otherwise negative for other  systems.   PHYSICAL EXAMINATION:  Patient is in no distress.  Blood pressure 130/60,  heart rate 60 and regular, body mass index 23.  HEENT:  Eyes unremarkable, pupils equal, round, react to light, fundi not  visualized, oral mucosa unremarkable.  NECK:  No jugular venous distention at 45 degrees, carotid upstroke brisk  and symmetric, no bruits, no thyromegaly.  LYMPHATICS:  No cervical, axillary or inguinal adenopathy.  LUNGS:  Clear to auscultation bilaterally.  BACK:  No costovertebral angle tenderness.  CHEST:  A well healed left upper ICD pocket without erythema, exudate or  hematoma.  HEART:  PMI not displaced or sustained, S1 and S2 within normal limits, no  S3, no S4, no murmurs.  ABDOMEN:  Flat, positive bowel sounds normal in frequency and pitch, no  bruits, no rebound, no guarding, no midline pulse, no masses, no  organomegaly.  SKIN:  No rashes, no nodules.  EXTREMITIES:  Pulses 2+ throughout, no edema, no cyanosis, no clubbing.  NEURO:  Oriented to person, place and time, cranial nerves II-XII grossly  intact, motor grossly intact.   Defibrillator Followup:  The atrial amplitude is 2.3, impedance 488,  threshold of 1 at 0.2, the right ventricular amplitude 15.2, impedance 544,  threshold 1 at 0.2, shock impedance was 48 ohms.  Left ventricular impedance  472 with a threshold of 1 at 0.2.  There had been no episodes.  He is in DDD  mode.  There is normal functioning.  He has A-sense V-pacing 22.8% of the  time and A-pace V-pace the other 76.8% of the time.   EKG:   Atrial ventricular paced rhythm with 100% capture.   1. Status post defibrillator the patient is doing well in respect to this.      We discussed the recall lead and in fact we will follow this closely,      but there is no indication to have it explanted at this point.  He was      reassured.  He will be followed by Dr. Graciela Husbands in the EP clinic.  2. Cardiomyopathy.  His blood pressure allows me to go up on his Coreg.  I      am going to increase this by 3.125 mg twice a day in addition to what      he is already taking.  Will have to make very slow steps on him as he      has been very leery of having his meds increased over time.  He seems      to be euvolemic at this time.  I am going to cut back on his Lasix 20      mg a day and he will weigh himself daily, watch his salt and take      p.r.n. __________ as needed.  3. Status post atrial flutter.  He is going to continue on his Coumadin      per Dr. Shelva Majestic.  4. Renal insufficiency.  He is to check a BMET today.  5. Coronary disease.  He is to continue with secondary risk reduction and      is having no symptoms consistent with angina.  No further      cardiovascular testing is suggested.  6. Follow up.  Will see him back in 4 weeks or sooner to see if we can      make medication titration.  Sooner if he has any problems.    ______________________________  [name], MD, [facility]    JH/MedQ  DD: 09/10/2006  DT: 09/11/2006  Job #: [id number]   cc:   Nadine Counts

## 2011-04-04 NOTE — H&P (Signed)
[name], [name]                ACCOUNT NO.:  [id number]   MEDICAL RECORD NO.:  [id number]          PATIENT TYPE:  INP   LOCATION:  1847                         FACILITY:  [facility]   PHYSICIAN:  Thomas C. Wall, M.D.   DATE OF BIRTH:  02/06/1946   DATE OF ADMISSION:  06/03/2006  DATE OF DISCHARGE:                                HISTORY & PHYSICAL   CHIEF COMPLAINT:  I cannot get my heart rate under control and it is just  wearing me out.   HISTORY OF PRESENT ILLNESS:  Mr. Maddex Garlitz is a 65 year old married white  male from [place], [place], well known to our practice.  His  cardiovascular problems include an ischemic cardiomyopathy status post  Cypher stent to the circumflex in July 2003.  His EF had been in the 25-35%  range but, most recently by echocardiography, was 15%.  At that time, it  appears that he was in atrial fibrillation per Dr. Jenene Slicker most recent  office note.  He has a history of paroxysmal atrial fibrillation, but over  the past two weeks, he has been in persistent atrial fibrillation.  He has  become progressively short of breath with now, currently, shortness of  breath with minimal exertion.  He has a rate dependent bundle versus left  bundle.  He also has a history of non-sustained V-tach.  He has been very  reluctant to take medications in the past.  He is also reluctant to increase  his Metoprolol any further because he says he cannot tolerate it.  He has  been on Coumadin which he has refused in the past which is currently  subtherapeutic at 1.4.  Apparently, he has been on it for the last couple of  weeks.  He came to the emergency room today with the impression of seeing  Dr. Graciela Husbands or Dr. Ladona Ridgel who are currently unavailable. I have assured him he  will see them in consultation in the morning.   PAST MEDICAL HISTORY:  In addition to the above, he has multiple allergies  which are listed.  He has multiple chemical intolerances and has to  wear a  mask a lot of the time to protect his breathing.  He strongly prefers a  private room.  He has recently been seeing Dr. Craige Cotta in pulmonary and has  been labeled fixed asthma.  He has been somewhat reluctant to take those  medicines prescribed.   ALLERGIES:  His allergies specific are AZITHROMYCIN, AMOXICILLIN, FLAGYL,  CODEINE, LATEX, AND NORVASC.   CURRENT MEDICATIONS:  Benicar 20 mg a day, Lisinopril 10 mg p.o. b.i.d.,  Metoprolol 50 b.i.d. which was just increased, Doxazosin 1 mg a day, fish  oil, co-enzyme q.10, multi-vitamin.  Other alternative medicines are listed,  digoxin 0.25 mg a day, Coumadin 2.5 mg a day, Lasix 80 mg in the morning,  potassium 20 mEq b.i.d.   SOCIAL HISTORY:  He lives in [place] with his wife, he is disabled.  He does not smoke.  He occasionally drinks wine.   FAMILY HISTORY:  Negative for premature coronary disease.  Both  parents had  heart disease in their 33s.   REVIEW OF SYSTEMS:  Other than the HPI, is noncontributory.  Specifically,  he denies any orthopnea, PND, peripheral edema now that he is on Lasix,  hemoptysis, melena, hematochezia, any bleeding diathesis.  He denies any  syncope or presyncope.  He has not been having any chest pain.   PHYSICAL EXAMINATION:  VITAL SIGNS:  Blood pressure 126/87, pulse 132 and irregular, there was one  pulse and Dr. Gala Romney, who was in the room, thought he saw some sawtooth  waves consistent with flutter.  It looks like mostly atrial fibrillation at  present.  He has bundle branch block, either interventricular conduction  delay or left bundle.  His respiratory rate is 16.  Saturations are 99% on  room air.  Temperature 98.2.  GENERAL:  He is in no acute distress.  He is wearing a surgical mask.  SKIN:  Warm and dry.  HEENT:  Sclerae clear, extraocular movements intact, PERRLA, normocephalic,  atraumatic, otherwise.  NECK:  Carotid upstrokes were equal bilaterally without bruits.  There was   mild JVD.  LUNGS:  Reveal some crackles in the left base.  CARDIAC EXAM:  Reveals a displaced PMI, there is no obvious S3, S2 is  physiologically split.  ABDOMEN:  Exam is soft, good bowel sounds, there is no hepatomegaly, there  is no obvious tenderness.  I can feel a liver edge, however.  EXTREMITIES:  Reveal no cyanosis, clubbing, and edema.  Pulses are intact.  NEUROLOGICAL:  Exam is intact.   LABORATORY DATA:  INR 1.4.  BUN and creatinine 21 and 1.5.  Hemoglobin 14.6.  Point of care markers x1 is negative.  Digoxin level 0.7.  Potassium 4.6.  Chest x-ray, to my view, shows interstitial edema with cardiomegaly.   ASSESSMENT:  1.  Paroxysmal atrial fibrillation now persistent for two weeks.  There is a      question of some flutter waves when he had a pause.  Rate control seems      to be a major factor and his reluctance and intolerance to medications      are also a consideration.  He is under the impression that he is to      receive EP consultation and possible device.  2.  Ischemic cardiomyopathy, EF of 15%.  He has a previous Cypher stent to      the left circumflex in 2003 as noted above.  3.  Class III congestive heart failure.  4.  Multiple allergies and chemical intolerances.  5.  Interventricular conduction delay/left bundle branch block, question      rate dependent.  6.  Fixed asthma recently seeing Dr. Craige Cotta.  7.  History of non-sustained ventricular tachycardia.  8.  Reluctance to take medications.   PLAN:  1.  Give p.m. dose Metoprolol 50 mg now to decrease ventricular rate.  2.  Unfractionated heparin until his INR is greater than 2 for 48 hours      overlap.  3.  Diuresis.  4.  O2.  5.  Private room per patient request.  6.  EP consult.  7.  Discontinue Doxazosin with his low EF.  8.  Question addition low dose Spironolactone.      Thomas C. Wall, M.D.  Electronically Signed   TCW/MEDQ  D:  06/03/2006  T:  06/03/2006  Job:  [id number]   cc:   Macarthur Critchley.  Shelva Majestic, M.D.  [name], M.D.  Nadine Counts

## 2011-04-04 NOTE — Discharge Summary (Signed)
NAME:  [name], [name] NO.:  [id number]   MEDICAL RECORD NO.:  [id number]                   PATIENT TYPE:  INP   LOCATION:  2032                                 FACILITY:  [facility]   PHYSICIAN:  Carole Binning, M.D. [facility]         DATE OF BIRTH:  23-Feb-1946   DATE OF ADMISSION:  06/21/2003  DATE OF DISCHARGE:  06/22/2003                                 DISCHARGE SUMMARY   BRIEF HISTORY:  This is a 65 year old male who was transferred to [facility] from [facility] for evaluation of chest pain and  palpitations.  He has a previous cardiac history significant for nonischemic  cardiomyopathy with a cardiac catheterization in 2000, as well as a cardiac  catheterization in July of 2003.  At that time, he had an ejection fraction  of 35% with global hypokinesis.  He had a 100% RCA occlusion with left-to-  right collaterals.  He had a Cypher stent to the circumflex which was 75%  occluded.  It was reduced to less than 10%.  He has a history of a seizure  disorder.  He has a history of hypertension.  History of a Holter monitor in  the past reviewed by Dr. Graciela Husbands and no further evaluation was felt to be  indicated at that time.  He does have a history of nonsustained ventricular  tachycardia.  He was admitted for further evaluation of his chest pain and  palpitations.   PAST MEDICAL HISTORY:  Please see above.   ALLERGIES:  1. PENICILLIN.  2. FLAGYL.  3. CODEINE.  4. PERFUMES.   SOCIAL HISTORY:  The patient lives in [place], [place], with  his wife.  He has been on disability since 1989.  They have no children.  He  has never been a smoker.  He drinks occasional wine.   FAMILY HISTORY:  His mother died at age 39 from heart trouble.  His father  died at age 65 from an MI.   HOSPITAL COURSE:  As noted, this patient was admitted to [facility]  for further evaluation of chest pain.  He is also followed by Dr. Sylvie Farrier in  [place], [place].  He was seen by Dr. Sylvie Farrier prior transferring up to  [facility].  It was felt that possible cardiac catheterization and  EP evaluation were indicated.  The patient was placed on the add-on board  for cardiac catheterization, however, Dr. Gerri Spore felt that a Cardiolite  could be performed.  If this was positive, he would be catheterization.   An adenosine Cardiolite was performed on June 22, 2003.  This showed no  ischemia with an ejection fraction of 31%.  Arrangements were made to  discharge the patient.  It was recommended that he have an event monitor  placed in followup to further evaluate for arrhythmias.  During his stay,  his lisinopril was increased secondary to hypertension.  Arrangements were  made to discharge the patient on June 22, 2003, in stable and improved  condition.   LABORATORY DATA:  Cardiac enzymes were negative.  A lipid profile was  pending.  Stool for occult blood was negative.  A CBC revealed a hemoglobin  of 15.3, hematocrit 45.8, WBC 6700, and platelets 259,000.  Chemistries  revealed a BUN of 18, creatinine 1.2, and potassium 3.9.  The SGPT was 19.  As noted, cardiac enzymes were negative.  The PT and PTT were within normal  limits.   DISCHARGE MEDICATIONS:  The patient was told to continue his previous  medications, although he was to increase the lisinopril to 20 mg daily.  1. He was on a lisinopril/hydrochlorothiazide combination 10/12.5 mg once     daily.  This would be increased to two tablets daily.  2. Lopressor 25 mg b.i.d.  3. Coated aspirin 81 mg daily.  4. Vitamin E.  5. Fish oil.  6. Calcium.  7. Magnesium.  8. Saw palmetto.  9. Phenobarbital 15 mg daily.  10.      Nitroglycerin p.r.n.   ACTIVITY:  To be as tolerated.   DIET:  He was told to stay on a low-salt, low-fat diet.   FOLLOWUP:  He was to have an outpatient event monitor to be scheduled  through the office, as well as a follow-up  appointment in approximately two  weeks.  The office would call him to schedule this.  The patient should a  basic metabolic panel performed at his next office visit as his  lisinopril/hydrochlorothiazide medication was increased.   PROBLEM LIST AT THE TIME OF DISCHARGE:  1. Chest pain and palpitations.  Myocardial infarction ruled out.  2. Adenosine Cardiolite performed on June 22, 2003, negative for ischemia.     Ejection fraction 31% with multiple PVCs noted during the study.  3. History of hypertension.  4. History of anxiety.  5. History of multiple allergies.  6. History of Cypher stent to the circumflex in the past.  7. History of cardiomyopathy with a previous ejection fraction of 35% by     catheterization.  8. Previous history of Holter monitor reviewed by Dr. Graciela Husbands with no further     evaluation felt to be indicated at that time.  9. History of nonsustained ventricular tachycardia.  10.      History of seizure disorder.  11.      Most recent catheterization in July of 2003 revealing 100% occluded     right coronary artery with left-to-right collaterals and a Cypher stent     placed to the circumflex at that time.  Ejection fraction 35%.      Delton See, P.A. [facility]                  Carole Binning, M.D. [facility]    DR/MEDQ  D:  06/22/2003  T:  06/22/2003  Job:  [id number]   cc:   Nadine Counts  [address]  [place]  [place] [address]  Fax: [phone]

## 2011-04-04 NOTE — Letter (Signed)
July 22, 2006     Macarthur Critchley. Shelva Majestic, M.D.  [address]  [place], [place] [address]   RE:  [name], [name]  MRN:  [id number]  /  DOB:  October 30, 1946   Dear Lisbeth Ply,   [name] come in today following his flutter ablation.  The final  interpretation was a little bit hard because he had evidence of block but  not entirely, and there turned out to be some coagulum on the catheter at  the end of the procedure which was limiting energy output.  However, he was  in sinus rhythm, and his ejection fraction previously identified at 15%  thought to be potentially tachycardia mediated has increased but only to  about 25%.   He continues to have symptoms of heart failure though currently class 2.  He  has bradycardia limiting his up titration, and interestingly he was given a  LifeVest to wear and had an episode of prolonged nonsustained ventricular  tachycardia that alerted the patient but did not require therapy.   He and his wife and I have had a lengthy discussion today to try to  understand what his options are.  I think one is to reassess his ejection  fraction in another 3-4 weeks to see whether it has further recovered and  whether it may preclude the need for ICD implantation.  Further testing at  that point could include  T-wave alternans testing that might help further  stratify him to low risk.  The alternative would be to proceed with ICD  implantation based on the lack of benefit in the last 4 weeks, and with his  left bundle branch block and his relative bradycardia he would be a  candidate for immediate CRT enrollment.  He would certainly need a dual-  chamber device implantation to allow for up titration of his beta blockers.   After the lengthy discussion, we have elected to proceed with the former.  He will get an echo again in about 3-4 weeks.  His examination today was  unrevealing.  His blood pressure was notable for being 132/50, so there is  certainly room to move with his blood pressure.    Sincerely,      Duke Salvia, MD, [facility]   SCK/MedQ  DD:  07/22/2006  DT:  07/22/2006  Job #:  [id number]

## 2011-04-04 NOTE — H&P (Signed)
NAMEFISHEL, [name]                ACCOUNT NO.:  [id number]   MEDICAL RECORD NO.:  [id number]          PATIENT TYPE:  OIB   LOCATION:  2899                         FACILITY:  [facility]   PHYSICIAN:  Maple Mirza, P.A. DATE OF BIRTH:  1946/03/04   DATE OF ADMISSION:  06/29/2006  DATE OF DISCHARGE:                                HISTORY & PHYSICAL   ADDENDUM   Additional medications the patient takes are Lisinopril 20 mg twice daily,  Benicar 20 mg daily, digoxin 0.25 mg, Coreg 12.5 mg twice daily and Lasix 40  mg daily.      Maple Mirza, P.A.     GM/MEDQ  D:  06/29/2006  T:  06/29/2006  Job:  [id number]

## 2011-04-04 NOTE — Discharge Summary (Signed)
NAMECAYDEN, GRANHOLM NO.:  [id number]   MEDICAL RECORD NO.:  [id number]          PATIENT TYPE:  INP   LOCATION:  2041                         FACILITY:  [facility]   PHYSICIAN:  Maple Mirza, P.A. DATE OF BIRTH:  1946/06/06   DATE OF ADMISSION:  06/29/2006  DATE OF DISCHARGE:                                 DISCHARGE SUMMARY   ADMIT DATE:  June 29, 2006   DISCHARGE DATE:  June 30, 2006   ALLERGIES:  This patient has allergies to CODEINE, PENICILLIN, PRILOSEC,  NORVASC, FLAGYL, AZITHROMYCIN, LATEX, AND AMOXICILLIN.   PRINCIPLE DIAGNOSES:  1. Atrial flutter with possibility of tachycardia mediated cardiomyopathy.  2. Discharge Day 1 status post electrophysiology study radiofrequency      catheter ablation of atrial flutter with creation of a cava-tricuspid      isthmus block.  A bi-directional block.   SECONDARY DIAGNOSES:  1. Transesophageal echocardiogram/DCCV June 04, 2006 converted to sinus      rhythm and presents August 13 in sinus rhythm.  2. Mixed cardiomyopathy ejection fraction is 15%.  A.)  ischemic cardiomyopathy:  100% occlusion of the right coronary artery  and the patient is status post cypher stent to the left circumflex.  B.)  Nonischemic cardiomyopathy possibly tachycardia mediated.  1. Class III congestive heart failure.  2. Left bundle branch block.  3. Interstitial lung disease versus BOOP.  4. History of acute renal insufficiency.  5. Seizure disorder.  6. Patient wears life vest until either one, ejection fraction improves      after atrial flutter ablation or the patient receives implantable      cardioverted defibrillator.   PROCEDURE:  June 29, 2006, electrophysiology study successful creation of  permanent bi-directional block at the cava-tricuspid isthmus.  Dr. Sherryl Manges.   BRIEF HISTORY:  Mr. Ohms is a 65 year old male.  He has a history of mixed  cardiomyopathy.  He has marked left ventricular dysfunction.  He  presented  June 03, 2006, in heart failure (BNP was greater than 2,000) and he was  found to have atrial flutter.   In the past, he has mentioned persistent dyspnea on exertion.  At the last  admission, he had a transesophageal echocardiogram with DC cardioversion.  At that time, his ejection fraction continued to be 15%.  In the past,  it  had been noticed that his coronary artery disease consisted of a 100%  occlusion of the right coronary artery and a cypher stent to the left  circumflex placed July 2003.   He has a history of nonsustained ventricular tachycardia.  He has left  bundle branch block.  His cardiomyopathy is possibly disproportionate to his  coronary artery disease.  It is somewhat obvious that his atrial Arrhythmia  contributes to a class III congestive heart failure.  He presents for atrial  flutter ablation today.   Last admission at life vest was ordered and fitted secondary to his  depressed ejection fraction, history of nonsustained ventricular  tachycardia, left bundle branch block, III congestive heart failure, and  coronary artery disease.  He will wear  it post ablation until ejection  fraction is reassessed.   HOSPITAL COURSE:  Patient presented electively on June 29, 2006.  He was  found to be in sinus rhythm.  His INR was also found to be therapeutic at  2.44.  He was taken to the electrophysiology lab, where he underwent  successful radiofrequency catheter ablation of his atrial flutter.  He has  been somewhat bradycardic in the post procedure period and his medications  have been held overnight, but they will be restarted on discharge.   INSTRUCTIONS:  He is to continue to wear the life vest until seen in the  office by Dr. Sherryl Manges.  He has office visit followup at the [facility] [address].  1. Echocardiogram, Thursday, September 20 at 11:30.  2. To see Dr. Graciela Husbands, Wednesday, September 26 at 11:45.   He is asked not to drive  for the next 2 days and not to lift anything  heavier than 10 pounds for the next 2 weeks.   DISCHARGE MEDICINES:  1. Coreg 12.5 mg twice daily.  2. Digoxin 0.25 mg daily.  3. Lisinopril 20 mg daily.  4. Benicar 20 mg to hold for now.  5. Lasix 40 mg daily.  6. Nitroglycerin 0.4 mg 1 tablet under the tongue every 5 and it is x3      doses as needed for chest pain.  7. Coumadin 5 mg tablets, 1-1/2 tablets daily.  8. Potassium chloride 20 mEq daily.  9. Enteric-coated aspirin 81 mg daily.   He is to keep taking his fish oil, Benadryl, COQ 10, multivitamin, and  Zantac as needed.  He was given hydrocortisone lotion to apply to areas  irritated by the life vest as needed.   Laboratory studies this admission consisted of coag values, which are PT  29.7, INR 2.6 on June 30, 2006.  His admission labs taken August 6:  Sodium 139, potassium 4.7, chloride 100, carbonate 30, glucose 78, BUN is  22,  creatinine 1.4.  His PTT was 32.2 and INR was 2.2.  Complete blood count:  White cells 3.8, hemoglobin 16.3, hematocrit 50.3, and platelets 274.   This is a 30-minute discharge.      Maple Mirza, P.A.     GM/MEDQ  D:  06/30/2006  T:  06/30/2006  Job:  [id number]   cc:   Duke Salvia, M.D.  Sherrine Maples, M.D.  [name], M.D.  Hoy Morn, M.D.

## 2011-04-04 NOTE — Op Note (Signed)
NAMEDAYSEAN, TINKHAM                ACCOUNT NO.:  [id number]   MEDICAL RECORD NO.:  [id number]          PATIENT TYPE:  INP   LOCATION:  6532                         FACILITY:  [facility]   PHYSICIAN:  Duke Salvia, MD, FACCDATE OF BIRTH:  1946/10/20   DATE OF PROCEDURE:  08/26/2006  DATE OF DISCHARGE:                                 OPERATIVE REPORT   PREOPERATIVE DIAGNOSES:  Congestive heart failure, ischemic and nonischemic  cardiomyopathy, left bundle branch block.   POSTOPERATIVE DIAGNOSES:  Congestive heart failure, ischemic and nonischemic  cardiomyopathy, left bundle branch block.   PROCEDURES:  Dual-chamber defibrillator implantation with left ventricular  lead placement and intraoperative defibrillation threshold testing.   SURGEON:  Duke Salvia, MD, [facility].   DESCRIPTION OF PROCEDURE:  Following the obtaining of informed consent, the  patient was brought to the electrophysiology laboratory and placed on the  fluoroscopic table in the supine position.  After routine prep and drape of  the left  upper chest, lidocaine was infiltrated in the  prepectoral/subclavicular region.  An incision was made and carried down to  the layer of the prepectoral fascia using electrocautery and sharp  dissection.  A pocket was formed similarly.  Hemostasis was obtained.   Thereafter, attention was turned to gaining access to the extrathoracic left  subclavian vein, which was accomplished without difficulty and without the  aspiration of air or puncture of the artery.  Three separate venipunctures  were accomplished.  Guide wires were placed and retained.   Sequentially, 7, 9.5 and 7-French hemostatic tear-away introducer sheaths  were placed, through which were passed sequentially a Medtronic 6949, 65-cm,  active-fixation, dual-coil defibrillator lead, serial #[id number].  A  Medtronic [id number], 88-cm left ventricular lead, serial [id number], and a  Medtronic 5076, 52-cm, active-fixation  atrial lead, serial #[id number].   Under fluoroscopic guidance, the initial lead was manipulated to the right  ventricular apex, where the bipolar R wave was 19.1, with a paced impedance  of 1069 ohms and a threshold of 1.2 V at 0.5 msec.  Shortly after screw  deployment, the current at threshold was 1.5 mA.  There was no diaphragmatic  pacing to 10 V.   This lead was secured to the prepectoral fascia, and then allowed to sit in  the backup mode during the rest of the procedure.   Through the 9.5-French sheath was placed a coronary sinus cannulation  catheter Medtronic MB2), which allowed for rapid cannulation of the coronary  sinus.  Contrast venography demonstrated a very inferiorly descending  lateral branch and a very short lateral branch that came off of the same  trifurcation.  There was a high anterolateral branch that came off the  coronary sinus at about the high anterolateral wall.  We then targeted this.  We used a Whisper wire to place the Medtronic bipolar lead.  However,  because it could not be inserted much past the midthird of the heart from  base to apex, we elected to assess its effectiveness in a Unipolar  configuration.  The L wave was 5.7 with a paced impedance of 603  ohms, and a  threshold of 0.6 V at 0.5 msec.  Current at threshold was 1.0 mA, and there  was no diaphragmatic pacing at 10 V.  The 9.5-French was then removed, and  then through the last sheath, the atrial lead was manipulated to the right  atrial appendage, where the bipolar P wave was 2.1, with a paced impedence  of 701, and a threshold shortly after screw deployment of 2.2 V at 0.5 msec.  Current at threshold was 4.2 mA.  We left the lead here because the current  of injury was really very brisk.   These leads were secured to the prepectoral fascia and attached to a  Medtronic 7299 InSync Sentry ICD, serial #[id number].  Through the device,  the bipolar P wave was 1 with a paced impedance of 520  ohms and a threshold  of 0.5 V at 0.3 msec.  Radiologically, the lead had not moved.  The bipolar  R wave was 11.2, with a paced impedance of 624 and a threshold of 1 V at  0.2, and the LV impedance was 528 with a threshold of 1 V at 0.3.  Proximal  coil impedance was 50; distal coil impedance was 43.   At this point, defibrillation threshold testing was undertaken.  Ventricular  fibrillation was induced via the T-wave shock.  After a total duration of 6  seconds, a 15-J shock was delivered through a measured resistance of 40  ohms, terminating ventricular fibrillation and restoring to sinus rhythm.   After a wait of 5 minutes, ventricular fibrillation was reinduced via the  T-  wave shock.  Again, a 15-J shock was delivered.  This was sent through a  resistance of 39 ohms, terminating ventricular fibrillation after 6 seconds  and restoring sinus rhythm.  At this point, the device was implanted.  The  pocket was copiously irrigated with antibiotic-containing saline solution.  Hemostasis was assured, and the leads and the pulse generator were placed in  the pocket and secured to the prepectoral fascia.  The wound was closed in 3  layers in the normal fashion.  And the wound was washed, dried, and a  benzoin and Steri-Strips dressing was applied.  Needle counts, sponge counts  and instrument counts were correct at the end of the procedure according to  the staff.  The patient tolerated the procedure without apparent  complication.           ______________________________  Duke Salvia, MD, [facility]     SCK/MEDQ  D:  08/26/2006  T:  08/26/2006  Job:  [id number]   cc:   Macarthur Critchley. Shelva Majestic, M.D.  Electrophysiology Laboratory  [facility]  [place]

## 2011-04-04 NOTE — Assessment & Plan Note (Signed)
[facility]                              CARDIOLOGY OFFICE NOTE   NAME:Manchester, RENA SWEEDEN                       MRN:          [id number]  DATE:06/22/2006                            DOB:          1946-11-17    PRIMARY:  Nadine Counts   REASON FOR PRESENTATION:  A patient with ischemic cardiomyopathy.   HISTORY OF PRESENT ILLNESS:  The patient was recently hospitalized with  atrial flutter and rapid ventricular rate.  His ejection fraction had  diminished to 15% with this rhythm.  He underwent TEE-guided cardioversion.  He is going to be admitted for atrial flutter ablation on August 13.  He  also had his medications significantly adjusted during that hospitalization.   He feels much better than when he was admitted.  He is having much less  shortness of breath and has more energy.  He has not gotten back to  activity.  He is actually wearing a LifeVest as he was noted to have the  severely reduced EF with nonsustained ventricular tachycardia.  He is also  being maintained on therapeutic Coumadin and is having his Coumadin followed  today.   He denies any chest discomfort, neck discomfort, arm discomfort, activity-  induced nausea or vomiting, excessive diaphoresis.  He has not been having  any palpitations, presyncope or syncope.   PAST MEDICAL HISTORY:  1.  Ischemic cardiomyopathy (last EF noted to be 15%).  2.  Atrial flutter status post TEE-guided cardioversion.  3.  Nonsustained ventricular tachycardia.  4.  Chronic left bundle branch block.  5.  Chronic interstitial lung disease.  6.  History of acute renal insufficiency.  7.  Coronary artery disease (total occlusion of right coronary with left to      right collaterals, CYPHER stent placed in the circumflex July 2003).  8.  Seizure disorder.  9.  Multiple chemical allergies.   ALLERGIES:  1.  PENICILLIN.  2.  PRILOSEC.  3.  E-MYCIN.  4.  CODEINE.  5.  AMOXICILLIN.  6.  CLARITIN.  7.   FLAGYL.  8.  LATEX.  9.  CORZIDE.   MEDICATIONS:  1.  Lisinopril 20 mg b.i.d.  2.  Benicar 20 mg b.i.d.  3.  Allergy drops.  4.  Nitroglycerin p.r.n.  5.  Multiple over-the-counters.  6.  Digoxin 0.25 mg daily.  7.  Coreg 12.5 mg b.i.d.  8.  Lasix 80 mg daily.  9.  Potassium 40 mEq daily.  10. Aspirin 81 mg daily.  11. Coumadin.   REVIEW OF SYSTEMS:  As stated in the HPI and otherwise negative for other  systems.   PHYSICAL EXAMINATION:  The patient is in no distress.  Blood pressure  118/70, heart rate 56 and regular, weight 169 pounds.  NECK:  No jugular venous distension at 45 degrees.  Carotid upstroke brisk  and symmetric without bruits, thyromegaly.  LYMPHATICS:  No cervical, axillary or inguinal adenopathy.  LUNGS:  Clear to auscultation bilaterally.  BACK:  No costovertebral angle tenderness.  CHEST:  Unremarkable.  HEART:  PMI not displaced or sustained.  S1  and S2 within normal limits.  No  S3, no S4, no murmurs.  ABDOMEN:  Flat, positive bowel sounds, normal in frequency and pitch, no  bruits, rebound or guarding.  No midline pulse.  No splenomegaly.  SKIN:  No rashes, ___________.  EXTREMITIES:  2+ pulses, no edema, no cyanosis, no clubbing.  NEURO:  Grossly intact throughout.   EKG sinus bradycardia, rate 56, left bundle branch block, left axis  deviation.   ASSESSMENT AND PLAN:  1.  Atrial flutter.  The patient is due to have ablation on August 13.  He      will continue on the Coumadin.  2.  Ischemic cardiomyopathy.  He is tolerating medications as listed.  This      is the first time he has agreed to take such a regimen and I am      delighted.  He will get a BMET today.  3.  Nonsustained ventricular tachycardia.  The patient is wearing a LifeVest      currently.  Will need to reassess his ejection fraction on the current      medical regimen and with his sinus rhythm to judge his risk of sudden      cardiac death.  The patient is not yet committed to  an implantable      cardioverter defibrillator.  I will discuss with Dr. Graciela Husbands the timing of      ordering another echocardiogram.  4.  Coronary artery disease.  He will continue secondary risk reduction.  He      is having no ongoing angina.  5.  Followup.  I will see him back in September but will be involved when he      has his hospitalization upcoming.                               [name], MD, [facility]    JH/MedQ  DD:  06/22/2006  DT:  06/22/2006  Job #:  [id number]   cc:   Nadine Counts

## 2011-04-04 NOTE — Discharge Summary (Signed)
NAMEROSHARD, REZABEK                ACCOUNT NO.:  [id number]   MEDICAL RECORD NO.:  [id number]          PATIENT TYPE:  INP   LOCATION:  3739                         FACILITY:  [facility]   PHYSICIAN:  Joellyn Rued, P.A. [facility] DATE OF BIRTH:  Feb 25, 1946   DATE OF ADMISSION:  06/03/2006  DATE OF DISCHARGE:  06/10/2006                           DISCHARGE SUMMARY - REFERRING   BRIEF HISTORY:  [name] is a 65 year old white male well-known to our  practice over the preceding two weeks.  He has been and persistent atrial  fibrillation with progressive shortness of breath and now associated with  minimal exertion.  He was referred to the emergency room for further  evaluation.  At the time of arrival he was under the impression that he was  to see Dr. Graciela Husbands or Ladona Ridgel, who were not available on the day of admission.  However he was assured that one of them would see him the following morning.   His history is notable for reluctant to take various medications in the  past, ischemic cardiomyopathy with Cypher stenting the circumflex in July  2003, EF of the 25-35 range, but most recently 15% by echocardiogram, atrial  fibrillation, multiple allergies, chemical intolerances.   LABORATORY:  Chest X-Ray on the 18th showed cardiomegaly, bibasilar  atelectasis.  Admission weight was 176.5.  Admission H&H was 13.1 and 40.1,  MCV was slightly elevated at 103.6, platelets 216, WBCs 5.5.  Subsequent  hematologies were essentially unchanged.  Prior to discharge H&H was 11.90,  36.1, MCV remained elevated at 101.9, platelets 190, WBCs 3.5.  On admission  PTT was 186, PT 19.6 with a subtherapeutic INR of 1.6.  Prior to discharge  PT was 26.9, PT 2.3.  Admission sodium is 132, potassium 4.3, BUN 20,  creatinine 1.6. LFTs were normal except for a total bilirubin that was  slightly elevated at 1.9.  Subsequent chemistries were essentially  unremarkable at the time of discharge.  Sodium was 132, potassium 4.1,  BUN  13, creatinine 1.3, glucose 92, BNP on admission was 2300 on the 20th, 942.  TSH on admission was 1.316.  Digoxin level 0.07.  EKG on the 19th showed  atrial flutter with variable block, ventricular rate of 67 right axis  deviation, delayed R-wave, left bundle branch block.  Subsequent EKG on the  20th showed sinus rhythm, right axis deviation, bundle branch block.   HOSPITAL COURSE:  Dr. Daleen Squibb admitted [name] to 3700 and placed him on his  home medications.  His Coumadin was increased given his subtherapeutic INR  in the setting of atrial flutter.  Doxazosin was discontinued given his  decreased ejection fraction.  He was placed on IV heparin until his INR was  greater than two.  Dr. Graciela Husbands saw the patient in consultation on 06/04/06.  Dr. Graciela Husbands felt primary issue was rate control.  Arrangements for made for a  TEE cardioversion.  By the 20th the patient felt much better in regards to  his breathing and daily weights were not documented.  TEE cardioversion was  performed by Dr. Gala Romney on 06/04/06. TEE showed  EF of 15% global  hypokinesis with left atrial dilatation and heavy smoke in the left atrium  and left atrial appendage.  No frank thrombus.  Using biphasic 100 joules,  he was restored to sinus rhythm.  Dr. Graciela Husbands after further review felt that  the patient was candidate for radiofrequency ablation.  This was scheduled  as an outpatient for August 13.  He also discussed LifeVest with the patient  and the patient desired this.  Thus arrangements were made.  Over the  weekend he continued diuresis.  Nursing did note an episode of  NFVT on  telemetry monitor.  Medications continued to be adjusted.  On 06/08/2006 Loura Pardon arranged instructions for LifeVest and the patient was provided with  this.  By 06/10/2006 Dr. Dietrich Pates felt that the patient could be discharged  home.   DISCHARGE DIAGNOSIS:  Recurrent atrial arrhythmias with increased  ventricular rate, atrial  fibrillation and atrial flutter, subtherapeutic  INR, CHF secondary to nonischemic cardiomyopathy with EF approximately 15%,  chronic left bundle branch block, interstitial lung disease, acute renal  insufficiency, nonsustained ventricular tachycardia.  History is noted  below.   PROCEDURE:  Status post TEE cardioversion restoring normal sinus rhythm on  06/04/06 by Dr. Gala Romney.   DISPOSITION:  The patient is discharged home.  He is asked to maintain a low  salt, fat cholesterol diet with a 1500 mL fluid restriction per day.  Activities are not restricted.  He was asked to weigh daily, to bring all  weights and medications to all appointments.  New medications include  lisinopril 20 mg b.i.d., Coreg 12.5 mg b.i.d., aspirin 81 mg q.d.  He  received a new prescription for Coumadin as we were increasing this to 5 mg  1-1/2 tablet q.d.  He was asked to continue digoxin 0.25 mg q.d., Benicar 20  mg q.d., Lasix 80 mg q.d.  He was given permission to continue his fish oil,  CoQ 10, multivitamin as previously.  He was asked not to take his Lopressor,  K-Dur or doxazosin.  He will have a PT/INR on Friday with copies sent to Dr.  Odessa Fleming office.  It is impaired at that his INR remain between 2 and 3,  given his procedure scheduled August 13.  He has a pacer/LifeVest  appointment on 06/15/2006 at 9:00 a.m.  He will follow up with Dr.  Jenene Slicker PA, Leveda Anna in the heart failure clinic on 06/22/2006 at  11:45.  He will return to short stay on August 13 at 0730 for EP  study/radiofrequency ablation.  Information was reported to me to be mailed  to the patient by Dr. Odessa Fleming nurse.  He will see Dr. Shelva Majestic and Dr. Harrison Mons  as scheduled.  Discharge time greater than 30 minutes.      Joellyn Rued, P.A. [facility]     EW/MEDQ  D:  06/10/2006  T:  06/10/2006  Job:  [id number]   cc:   [name], M.D.  [address]  [address]  [place]  [place] [address]   Macarthur Critchley. Shelva Majestic, M.D. Fax:  [phone]   Nadine Counts  Fax: [phone]   Duke Salvia, M.D.  [phone]  [address]  [place]  [place] [address]
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
## 2011-04-04 NOTE — Discharge Summary (Signed)
NAMEJULUIS, FITZSIMMONS NO.:  [id number]   MEDICAL RECORD NO.:  [id number]          PATIENT TYPE:  INP   LOCATION:  2041                         FACILITY:  [facility]   PHYSICIAN:  Duke Salvia, M.D.  DATE OF BIRTH:  03-Jul-1946   DATE OF ADMISSION:  06/29/2006  DATE OF DISCHARGE:  06/30/2006                                 DISCHARGE SUMMARY   ADDENDUM TO DISCHARGE SUMMARY   This addendum covers a revision of the followup appointments with Dr. Graciela Husbands  at [facility].  Echocardiogram now scheduled for Tuesday, July 21, 2006 at 9:30.  He will see Dr. Graciela Husbands Wednesday, July 22, 2006 at  11:45.  The patient's mentions to me at the discharge that perhaps a repeat  electrophysiology study will be scheduled when he sees Dr. Graciela Husbands July 22, 2006 to assess the efficacy of the radio frequency catheter ablation that  was performed on June 29, 2006.  If so, this will be scheduled on  July 22, 2006 when he sees Dr. Graciela Husbands.      Maple Mirza, P.A.    ______________________________  Duke Salvia, M.D.    GM/MEDQ  D:  06/30/2006  T:  06/30/2006  Job:  [id number]   cc:   Macarthur Critchley. Shelva Majestic, M.D.  Hoy Morn, M.D.

## 2011-04-04 NOTE — Discharge Summary (Signed)
[facility]  Patient:    [name], [name] Visit Number: [id number] MRN: [id number]          Service Type: CAT Location: [id number] Attending Physician:  Daisey Must Dictated by:   Lavella Hammock, P.A. Admit Date:  05/26/2002 Discharge Date: 05/27/2002                    Referring Physician Discharge [place]  DATE OF BIRTH:  08-Jun-1946  PROCEDURES: 1. Cardiac catheterization. 2. Coronary arteriogram. 3. Left ventriculogram. 4. PTCA and stent to one vessel.  HOSPITAL COURSE:  The patient is a 65 year old male who was known to have coronary artery disease that was nonobstructive by catheterization in October 2000.  He has been treated medically since that time.  He was seen in the office on May 19, 2002 for palpitations and was noted to have PVCs, and since his catheterization was three years old and he was having some chest pain as well he was scheduled for a cardiac catheterization.  The patient came in the hospital for cardiac catheterization on May 26, 2002. The cardiac catheterization showed a normal left main and an LAD with less than 20% stenosis, a diagonal without any significant disease.  The circumflex had less than 20% stenosis in the proximal portion and a 75% distal stenosis. There was a branch of the circumflex that had a 50% stenosis.  The RCA was totaled but was nondominant and there was some collateral flow.  His EF was 38%.  He had PTCA and stent to the circumflex, reducing that stenosis from 75% to less than 10% with TIMI 3 flow.  He tolerated the procedure well and the sheath was removed without difficulty.  Because of the PVCs and his EF being less than 40%, an EP consult was called . He was seen by Chinita Pester, N.P., and also by Dr. Graciela Husbands.  A consult note was dictated but no further workup was indicated at this time.  The patient was seen by cardiac rehab post procedure and ambulated without difficulty.  He  was educated as far as his PTCA and stent and risk factor modification as well as use of nitroglycerin and calling [phone].  Additionally, he received some help with social work consult for assistance with Plavix.  He had some filled through the [facility] and a prescription was given to the patient as well.  A form was also filled out and sent to the company for assistance with Plavix from them.  With no further workup indicated as far as the PVCs and possible nonsustained ventricular tachycardia, and with the patient ambulating well after catheterization, he was considered stable for discharge on May 27, 2002.  DISCHARGE MEDICATIONS: 1. Coated aspirin 325 mg q.d. 2. Phenobarbital 34.4 mg at home dose q.d. 3. Zestoretic 10/12.5 mg one-half tablet b.i.d. 4. Plavix 75 mg q.d. 5. Lopressor 50 mg one-half tablet b.i.d. 6. Nitroglycerin 0.4 mg p.r.n.  FOLLOW-UP:  He is to go to the office in one week for a groin check and make an appointment, and he needs to follow-up with Dr. Antoine Poche on June 13, 2002 at 10:45 a.m. Dictated by:   Lavella Hammock, P.A. Attending Physician:  Daisey Must DD:  05/27/02 TD:  05/27/02 Job: [id number]

## 2011-04-04 NOTE — Op Note (Signed)
[name], [name] NO.:  [id number]   MEDICAL RECORD NO.:  [id number]          PATIENT TYPE:  INP   LOCATION:  2041                         FACILITY:  [facility]   PHYSICIAN:  Duke Salvia, M.D.  DATE OF BIRTH:  [birth year]   DATE OF PROCEDURE:  06/29/2006  DATE OF DISCHARGE:                                 OPERATIVE REPORT   PREOPERATIVE DIAGNOSIS:  Atrial flutter.   POSTOPERATIVE DIAGNOSIS:  Inducible atrial fibrillation.   PROCEDURE:  Evasive electrophysiology study.  Arrhythmia mapping.  Radiofrequency catheter ablation.   COMPLICATIONS:  Unbeknownst char formation on the proximal pole of the  ablation catheter.   Following obtaining informed consent, the patient was brought to the  Electrophysiology Laboratory and placed on the fluoroscopic table in supine  position.  After routine prep and drape, cardiac catheterization was  performed with local anesthesia and conscious sedation.  Noninvasive blood  pressure monitoring and transcutaneous oxygen saturation monitoring were  performed continuously throughout the procedure.  Following the procedure,  the catheters were removed, hemostasis was obtained, and the patient was  transferred to the floor in stable condition.   It was at the attempted removal of the catheter that I noted a problem.  Initially, I could not withdraw the catheter into the sheath.  Because of  that, the sheath and the catheter were removed as a unit, and there was a  dark, circumferential discoloration of the proximal pole of the catheter  that appeared to be quite sheer.  I actually was able to peel it off with  Kelly clamps and wiped it off with a wet sponge, and it was red, consistent  with charred blood.  (See below.)   CATHETERS:  1. A 5-French quadripolar catheter was inserted via left femoral vein to      the AV junction to measure the His electrogram.  2. A 7-French duodecapolar catheter was inserted via the left femoral  vein      to the tricuspid annulus into the coronary sinus.  3. An 8-French, 8-mm deflectable tip ablation catheter was inserted via      SL2 sheath to mapping sites in the posterior septal space and      subsequently moved to the ventricle for ventricular pacing.   Surface leads I, aVF and V1 were monitored continuously throughout the  procedure.  Following the insertion of the catheters, the stimulation  protocol included incremental atrial pacing, incremental ventricular pacing.   Single atrial extrastimuli to paced cycle lengths of 700 milliseconds.  Coronary sinus pacing.   RESULTS:  SURFACE ELECTROCARDIOGRAM AND PACING INTERVALS:  Initial:  Rhythm:  Sinus; RR interval only 847 milliseconds; PR interval:  202  milliseconds; QRS duration 180 milliseconds; QT interval 410 milliseconds; P  wave duration 131 milliseconds; bundle branch block:  At present, tied to  the left; preexcitation:  Absent.   AH interval:  158 milliseconds; HV interval:  49 milliseconds.   Final:  Rhythm:  Sinus; RR interval:  1203 milliseconds; PR interval 232  milliseconds; QRS duration 172 milliseconds; QT interval 468 milliseconds; P  wave  duration:  157 milliseconds; bundle branch block:  At present, tied to  the left; preexcitation:  Absent.   AH and HV interval will not be measured at the end of the study.   A-V NODAL FUNCTION:  1. A-V Wenckebach was 450 milliseconds.  VA conduction was dissociated at      600 milliseconds.  2. The AV nodal effective refractory period at a paced cycle and for 700      milliseconds was 500 milliseconds, and A-V nodal conduction was      continuous.   ACCESSORY PATHWAY FUNCTION:  No evidence of any accessory pathway was identified.   VENTRICULAR RESPONSE TO PROGRAM STIMULATION:  Normal for ventricular stimulation as described previously.   Arrhythmia was induced.  Atrial fibrillation was induced with coronary sinus  pacing.  This terminated spontaneously.   Because of this, RF energy was  applied via catheter ablation during coronary artery sinus pacing.   FLUOROSCOPY TIME:  A total of 19 minutes and 50 seconds of fluoroscopy times was utilized at 7-  1/2 frames per second.   RADIOFREQUENCY ENERGY:  A total of 62 minutes and 41 seconds of RF energy was applied between the  tricuspid annulus and the inferior vena cava.  On 3 occasions, bidirectional  isthmus block was generated; however, it was not sustainable.  Double  potentials were noted across most of the isthmus, except for an area of very  complex electrograms just inferior and posterior to the coronary sinus os.  There was evidence of a diverticulum here with very high voltage, which  could not be obliterated by the catheter.   It was on the removal of the catheter, as noted previously, that the char  was identified, and then retrospectively as I looked at the radio wave log,  it became clear that after lesion approximately 25 out of 50, the energy  never got above 40 watts, and most of the time was in the 20-watt range.  I  did not appreciate the implications of this vis-a-vis char on the catheter.  I had thought that it was related to the diverticulum, which I found myself  in repeatedly.  This, however, explains (a) the lack of deliverable power,  (b) the lack of ability to eliminate the electrogram voltage from these  diverticula, and (c) the inability to generate persistence of bidirectional  block.   However, because of the prolonged nature of the procedure and the prolonged  amount of RF given, notwithstanding the subsequent observation that 1/2 of  it was given under very low power, it had been elected to terminate the  procedure, with the incomplete result as noted previously.   The catheters were removed as noted previously.  The patient was transferred  to the holding area in stable condition.  The patient will be brought back in 3-4 weeks following an outpatient   echocardiogram.  This will be done to assess the recovery of his LV function  and after a decision is made whether to plant a defibrillator.  We will also  plan to undertake the EP study with RF catheter ablation with ESI support at  that time.           ______________________________  Duke Salvia, M.D.     SCK/MEDQ  D:  06/29/2006  T:  06/30/2006  Job:  [id number]   cc:   Electrophysiology Laboratory  [facility]
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
## 2011-04-04 NOTE — Cardiovascular Report (Signed)
. [facility]  Patient:    JUEL, BELLEROSE Visit Number: [id number] MRN: [id number]          Service Type: CAT Location: 6500 [id number] Attending Physician:  Daisey Must Dictated by:   Daisey Must, M.D. [facility] Proc. Date: 05/26/02 Admit Date:  05/26/2002 Discharge Date: 05/27/2002   CC:         Polly Cobia, M.D.  Aundra Dubin. Revankar, M.D.  Cardiac Catheterization Laboratory   Cardiac Catheterization  PROCEDURES PERFORMED: 1. Left heart catheterization with coronary angiography and left    ventriculography. 2. Percutaneous transluminal coronary angioplasty with stent placement    in the distal left circumflex coronary artery.  INDICATIONS: The patient is a 65 year old male with a history of a nonischemic cardiomyopathy. Prior catheterization performed in 2000 revealed no obstructive coronary artery disease. He recently has been having symptoms of typical and atypical chest pain. In addition he has had palpitations and a Holter monitor has shown runs of prolonged but nonsustained ventricular tachycardia. He was therefore referred for repeat catheterization.  DESCRIPTION OF PROCEDURE: A 6 French sheath was placed in the right femoral artery.  Standard Judkins 6 French catheters were utilized.  Contrast was Omnipaque.  There were no complications.  RESULTS:  HEMODYNAMICS: Left ventricular pressure 142/26, aortic pressure 142/76.  There was no aortic valve gradient.  LEFT VENTRICULOGRAM: There is moderate global hypokinesis of the left ventricle. Ejection fraction is calculated at 38%. There is no mitral regurgitation.  CORONARY ARTERIOGRAPHY: (Left dominant).  Left main: Normal.  Left anterior descending: The left anterior descending has minor luminal irregularities in the mid vessel. The LAD gives rise to a large branching first diagonal branch.  Left circumflex: The left circumflex is a large dominant vessel. There are minor  luminal irregularities in the proximal vessel. The distal circumflex has a 75% followed by a 30% stenosis prior to a normal sized posterior descending artery. The circumflex gives rise to a small ramus intermediate, normal sized bifurcating OM-1, normal sized OM-2, small first and second posterolateral branches and a normal sized posterior descending artery. The second posterolateral branch has a 60% stenosis at its origin.  Right coronary artery: Right coronary artery is a relatively small, nondominant vessel. There is 100% occlusion in the mid right coronary artery with right to right collaterals filling the distal vessel as well as few left to right collaterals filling the distal right coronary artery.  IMPRESSIONS: 1. Moderately decreased left ventricular systolic function secondary    to a nonischemic cardiomyopathy. 2. One-vessel coronary artery disease characterized by significant disease    in the distal portion of the dominant left circumflex and chronic    total occlusion of a nondominant right coronary artery.  PLAN: Percutaneous intervention of the distal left circumflex. See below.  PERCUTANEOUS TRANSLUMINAL CORONARY ANGIOPLASTY PROCEDURE: Following completion of the diagnostic catheterization, we proceeded with percutaneous coronary intervention. The 6 French sheath in the right femoral artery was exchanged over a wire for a 7 [place] sheath. Angiomax was administered per protocol. We used a 7 [place] Voda left 3.5 guiding catheter and a BMW wire. We initially dilated the lesion with a 2.5 x 12 mm Quantum balloon inflated to 12 atmospheres. We then deployed at 2.5 x 18 mm Cypher drug-eluting stent at a deployment pressure of 12 atmospheres. This stent was positioned to cover both a 75% followed by a 30% lesion in the distal circumflex. We then post-dilated this stent with a 3.0 x 12 mm Quantum  balloon inflated to 18 atmospheres in the proximal to midportion of the stent. We  then used a 3.5 x 8 mm Quantum balloon and inflated it to 6 atmospheres in the midportion of the stent and 18 atmospheres in the proximal portion of the stent. Final angiographic images revealed patency of the distal left circumflex with less than 10% residual stenosis and TIMI-3 flow.  COMPLICATIONS: None.  RESULTS: Successful PTCA with stent placement in the distal left circumflex utilizing a drug-eluting stent. A 75% stenosis was reduced to less than 10% residual with TIMI-3 flow.  PLAN: Angiomax will be discontinued at this point. The patient will be treated with a combination of aspirin and Plavix therapy for a minimum of six months followed by indefinite aspirin therapy. Dictated by:   Daisey Must, M.D. [facility] Attending Physician:  Daisey Must DD:  05/26/02 TD:  05/29/02 Job: [id number]

## 2011-04-04 NOTE — Assessment & Plan Note (Signed)
[facility]                            CARDIOLOGY OFFICE NOTE   NAME:[name], [name]                       MRN:          [id number]  DATE:07/28/2007                            DOB:          05/10/1946    PRIMARY CARE PHYSICIAN:  Nadine Counts, MD   REASON FOR PRESENTATION:  Evaluate the patient with ischemic  cardiomyopathy.   HISTORY OF PRESENT ILLNESS:  The patient returns for routine followup.  Since I last saw him, he had an upper respiratory infection and  developed some discomfort in the back of his chest. He was short of  breath and had a BNP level which his wife reports was apparently 3800.  He was therefore managed apparently with increased diuresis and had  subsequent improvement of his symptoms and resolution of his elevated  BNP to baseline of 121. He was seen by Dr.  Shelva Majestic. Since I have seen  him Dr.  Shelva Majestic has managed his medications and actually has reduced  his regimen to include only Coreg 25 mg b.i.d. and lisinopril 20 mg  daily. He does take nitroglycerine as needed and has used it one time  since I last saw him. He says he is now back to baseline. He says his  weight has not come down however. He is not having the back discomfort  or dyspnea he was having. He is not having any PND or orthopnea. He has  not had any chest discomfort recently and denies any neck or arm  discomfort. He has had no palpitations, pre-syncope or syncope.   PAST MEDICAL HISTORY:  1. Ischemic cardiomyopathy (ejection fraction of 15%).  2. Atrial flutter status post TEE guided cardioversion.  3. Non-sustained ventricular tachycardia.  4. Chronic left bundle branch block.  5. Chronic interstitial lung disease.  6. History of acute renal insufficiency.  7. Coronary artery disease (total occlusion of the right coronary      artery with the left to right collaterals).  8. Cypher stenting of the circumflex in July 2003.  9. Seizure disorder.  10.MULTIPLE  CHEMICAL ALLERGIES.  11.Status post implantation of a Medtronic cardioverted      defibrillator/CRT.   ALLERGIES:  PENICILLIN, PRILOSEC, E-MYCIN, CODEINE, AMOXICILLIN,  CLARITIN, FLAGYL, LATEX, CORZIDE.   CURRENT MEDICATIONS:  1. Coreg 25 mg b.i.d.  2. Lisinopril 20 mg daily.  3. Digestive aid.  4. Green tea.  5. Saw palmetto.  6. Vitamin C.  7. Multivitamin.  8. Fish oil.  9. Co-enzyme Q.   REVIEW OF SYSTEMS:  As stated in the HPI and otherwise negative for  other systems.   PHYSICAL EXAMINATION:  The patient is in no distress. Blood pressure  135/80, heart rate 60 and regular, weight 170 pounds.  HEENT: Eyelids unremarkable. Pupils equal, round, and reactive to light.  Fundi not visualized. Oral mucosa is unremarkable.  NECK: No jugular venous distention at 45 degrees. Carotid upstroke brisk  and symmetrical. No bruits, no thyromegaly.  LYMPHATICS: No adenopathy.  LUNGS: Clear to auscultation bilaterally.  BACK: No costovertebral angle tenderness.  CHEST: Unremarkable.  HEART: PMI not displaced  or sustained. S1, S2 within normal limits. No  S3. No S4. No murmurs.  ABDOMEN: Flat, positive bowel sounds, normal in frequency and pitch. No  bruits. No rebounds. No guarding. No midline pulsatile mass. No  organomegaly.  SKIN: No rashes, no nodules.  EXTREMITIES: 2+ pulses, mild bilateral lower extremity edema.  NEURO: Grossly intact.   EKG: Atrial ventricular paced rhythm with 100% capture.   ICD interrogation: The patient did have his ICD interrogated. He does  have a [id number] Sprint Fidelis recall right ventricular lead. He had this  interrogated and he has normal functioning. The RV lead integrity, RV  pacing lead impendence and high voltage lead impendence audible alerts  were programmed on. The patient was informed on how this should work  should there be any abnormalities. He otherwise was noted to have atrial  amplitude of 1.6 millivolts, pacing impendence 552 ohms, the  RV  impendence 572 ohms with a defibrillator impendence of 45 ohms, the LV  lead impendence was 496 ohms. The atrial threshold is 0.4 milliseconds  and 0.3 millivolts, RV 0.4 milliseconds and 0.3 millivolts. The patient  is nearly 100% pacing in the ventricle with 67% A pacing as well. The  patient's OptiVol fluid index is acceptable readings. He has had no  device firings.   ASSESSMENT/PLAN:  1. Cardiomyopathy. The patient seems to be doing relatively well. He      is having his volume followed closely by Dr.  Harrison Mons and by Dr.      Shelva Majestic. We had a long discussion about following BNP levels, which      is not yet standard of care. I believe measuring BNP levels is      helpful as a baseline and when there is any question of volume      overload. We also have the OptiVol index which can be followed to      help US guide our clinical judgment. Mr. Widrig and I had a long      (greater than a half hour) discussion about BNP levels and he      understands how it fits in with the clinical judgment, but should      not supercede this. For now, he will continue with medications as      listed.  2. Coronary disease. I encouraged him to take an aspirin.  3. Followup. He prefer to come back to this clinic every six months.      He will follow closely with Dr.  Shelva Majestic and Dr.  Harrison Mons. He will      follow as indicated with our Device Clinic.     [name], MD, [facility]  Electronically Signed    JH/MedQ  DD: 07/28/2007  DT: 07/28/2007  Job #: [id number]   cc:   Nadine Counts

## 2011-04-04 NOTE — Assessment & Plan Note (Signed)
[facility]                            CARDIOLOGY OFFICE NOTE   NAME:[name], [name] LISENBY                       MRN:          [id number]  DATE:01/19/2007                            DOB:          12-Oct-1946    PRIMARY:  Nadine Counts, M.D.   REASON FOR PRESENTATION:  Evaluate patient with ischemic cardiomyopathy.   HISTORY OF PRESENT ILLNESS:  The patient returns for followup.  He last  was seen by Dr. Graciela Husbands in February for evaluation of the defibrillator.  He has had no firings of this.  He has appropriate biventricular  pacemaker function and actually had an improvement in his activity index  with this.  He has no new shortness of breath.  He thinks he has a  little more energy.  He has no PND or orthopnea.  He has had no weight  gain or swelling.  He has had some funny feelings like his heart might  be beating down below his subxiphoid area.  He is not describing  stimulation of his diaphragm, however.  He has had no presyncope or  syncope.  This is intermittent and not particularly bothersome.   PAST MEDICAL HISTORY:  1. Ischemic cardiomyopathy (EF 15%).  2. Atrial flutter status post TEE-guided cardioversion.  3. Nonsustained ventricular tachycardia.  4. Chronic left bundle-branch block.  5. Chronic interstitial lung disease.  6. History of acute renal insufficiency.  7. Coronary artery disease (total occlusion of the right coronary      artery with left-to-right collaterals).  8. Cypher stenting placed in the circumflex in July 2003.  9. Seizure disorder.  10.Multiple CHEMICAL allergies.  11.Status post implantation of a Medtronic cardioverter-      defibrillator/CRT.   ALLERGIES:  1. PENICILLIN.  2. PRILOSEC.  3. E-MYCIN.  4. CODEINE.  5. AMOXICILLIN.  6. CLARITIN.  7. FLAGYL.  8. LATEX.  9. CORZIDE.   MEDICATIONS:  1. Digestive Aid.  2. Green tea.  3. Saw palmetto.  4. Vitamin C.  5. Multivitamin.  6. Fish oil.  7. Coenzyme  Q.  8. Digoxin 0.25 mg daily.  9. Aspirin 81 mg daily.  10.Coumadin.  11.Lisinopril 20 mg daily.  12.Coreg 25 mg b.i.d.  13.Valerian.   REVIEW OF SYSTEMS:  As stated in the HPI and otherwise negative for  other systems.   PHYSICAL EXAMINATION:  GENERAL:  The patient is in no distress.  VITAL SIGNS:  Blood pressure 150/90, heart rate 60 and regular.  HEENT:  Eyelids unremarkable.  Pupils equal, round, and react to light.  Fundi not visualized.  Oral mucosa unremarkable.  NECK:  No jugular venous distention at 45 degrees. Carotid upstroke  brisk and symmetric.  No bruits.  LYMPHATICS:  No cervical, axillary or inguinal adenopathy.  LUNGS:  Clear to auscultation bilaterally.  BACK:  No costovertebral angle tenderness.  CHEST:  Well-healed ICD pocket.  HEART:  PMI not displaced or sustained.  S1 and S2 within normal limits.  No S3, no S4, no clicks, no rubs, no murmurs.  ABDOMEN:  Flat, positive bowel sounds normal in frequency and pitch.  No  bruits, no rebound, no guarding.  No midline pulsatile mass.  No  organomegaly.  SKIN:  No rashes, no nodules.  EXTREMITIES:  Show 2+ pulses, no edema.   EKG:  Atrioventricular paced rhythm with 100% capture.   ASSESSMENT AND PLAN:  1. Cardiomyopathy.  The patient seems to have class II symptoms at      this point.  He is improved with the CRT.  He is on a good medical      regimen.  His blood pressure is running a little bit high.      However, it runs intermittent and he has a little white coat      hypertension.  I will ask him to keep an eye on this at home.  He      could have up-titration of his lisinopril to 40 mg daily which      carries some increased clinical benefit in clinical trials.  2. Followup.  The patient would like to be seen back in 6 months.    [name], MD, [facility]  Electronically Signed   JH/MedQ  DD: 01/19/2007  DT: 01/19/2007  Job #: [id number]   cc:   Nadine Counts

## 2011-04-04 NOTE — H&P (Signed)
[name], [name] NO.:  [id number]   MEDICAL RECORD NO.:  [id number]          PATIENT TYPE:  OIB   LOCATION:  2899                         FACILITY:  [facility]   PHYSICIAN:  Duke Salvia, M.D.  DATE OF BIRTH:  10-02-1946   DATE OF ADMISSION:  06/29/2006  DATE OF DISCHARGE:                                HISTORY & PHYSICAL   ELECTROPHYSIOLOGIST:  Duke Salvia, M.D.   CARDIAC ASSESSMENT:  Macarthur Critchley. Shelva Majestic, M.D. and [name], M.D.   PRIMARY CARE PHYSICIAN:  Nadine Counts, M.D.   PULMONOLOGIST:  Coralyn Helling, M.D.   ALLERGIES:  CODEINE, PENICILLIN, PRILOSEC, NORVASC, FLAGYL, AZITHROMYCIN,  LATEX, AMOXICILLIN.   PRESENTING CIRCUMSTANCE:  I am here for Lotrin.   HISTORY OF PRESENT ILLNESS:  [name] is a 65 year old male with a history  of mixed cardiomyopathy, marked left ventricular dysfunction, who presented  June 03, 2006 in heart failure (BNP greater than 2000) and found to have  atrial flutter.   In the past, he has mentioned persistent dyspnea on exertion.  This last  admission he had transesophageal echocardiogram/DC cardioversion.  At that  time his ejection fraction continued to be 15%.  In the past it had been  noticed, that his coronary artery disease consists of 100% occlusion of the  right coronary artery and a CYPHER  stent to the circumflex in July 2003.   He has a history of nonsustained ventricular tachycardia.  He has left  bundle branch block.  His cardiomyopathy is possibly disproportionate to his  coronary artery disease.  It is obvious that atrial arrhythmia contributes  to a class III congestive heart failure.  He presents for atrial flutter  relief today.   Last admission Life Vest was ordered and fitted secondary to depressed  ejection fraction, history of nonsustained ventricular tachycardia, left  bundle branch block, class III congestive heart failure, coronary artery  disease. He will wear it post ablation until  ejection fraction is  reassessed.   Additionally, he is being evaluated by Dr. Craige Cotta for possible BOOP versus  interstitial lung disease.  In the past, [name] has been reticent to  take medications, particularly those designated for heart failure such as  Coreg, spironolactone, digoxin.   [name] has no acute complaints today.  He has not been exerting himself  much or going outside much because of the heat but notices no increasing  dyspnea with his activities of daily living.  He is not complaining of chest  pain, he has no history of __________ Lynnell Jude.   PAST MEDICAL HISTORY:  1. Mixed cardiomyopathy with ejection fraction of 15%.      a.     Ischemic cardiomyopathy with 100% right coronary artery, CYPHER       to the left circumflex .      b.     Nonischemic cardiomyopathy, possibly tachycardia, mediated.  2. Atrial flutter, transesophageal echocardiogram/DC cardioversion June 04, 2006.  3. NSVT.  4. Left bundle branch block.  5. Class III congestive heart failure.  6. ILD versus  BOOP.  7. History of acute renal insufficiency.  8. Seizure disorder.  9. Possible device candidate, if ejection fraction does not improve after      ablation.  Patient currently wearing life vest.   MEDICATIONS:  1. Coumadin 5 mg 1-1/2 tab daily.  2. Aspirin 81 mg daily.  3. Potassium chloride 20 mEq daily.  4. Zantac, Benadryl as needed.  5. Fish oil daily.  6. COQ10 daily.   PHYSICAL EXAMINATION:  GENERAL:  Alert and oriented x 3, in no acute  distress.  VITAL SIGNS:  Temperature 97. 8, blood pressure 132/87, respiratory rate 16,  pulse is 58, oxygen saturations 98%, weight 164.  LUNGS:  Clear to auscultation and percussion bilaterally.  HEART:  Regular rate and rhythm with S3.  Electrocardiogram shows sinus  rhythm with PVCs.  ABDOMEN:  Soft, nondistended, bowel sounds are present.  EXTREMITIES:  No edema.  Dorsalis pedis pulses 4/4 bilaterally, radial pulse  4/4  bilaterally.  NEUROLOGIC:  No deficits.   IMPRESSION:  1. Cardiomyopathy.  2. Atrial flutter on Coumadin.  Now in sinus rhythm.  3. Class III congestive heart failure.  4. Status post transesophageal echocardiogram/DC cardioversion, July 19.  5. Wearing Life Vest for left bundle branch block/NSVT/class III      congestive heart failure/ ejection fraction 15%.   PLAN:  Atrial flutter ablation today.  Life Vest may be taken off once the  patient is on telemetry.  We are to investigate the rash at the electrode  sites on his Life Vest.      Maple Mirza, P.A.    ______________________________  Duke Salvia, M.D.    GM/MEDQ  D:  06/29/2006  T:  06/29/2006  Job:  [id number]

## 2011-04-04 NOTE — Letter (Signed)
December 29, 2006    Macarthur Critchley. Shelva Majestic, M.D.  [address] [place]  [address]  [place], [place] [address]   RE:  [name]  MRN:  [id number]  /  DOB:  02/19/1946   Dear Lisbeth [name]:   [name] comes in today.  His breathing is much better since his CRT  implantation.  He has some complaints of his heartbeat in his  epigastrium but it does not sound like diaphragmatic stimulation.  There  is also some soreness in his left shoulder, which is gradually abating.   There is some confusion on his medications.  He is trying to get that  clarified.  His Coreg dose is currently at 18.75 mg b.i.d., aspirin and  Coumadin are on board, Lanoxin, Lasix, and lisinopril.   PHYSICAL EXAMINATION:  VITAL SIGNS:  His blood pressure today was  152/90.  LUNGS:  Clear.  CARDIAC:  His heart sounds were regular.  CHEST:  The device pocket was well-healed.  EXTREMITIES:  His ankles were without edema.   Interrogation of his Medtronic Sentry [id number] ICD demonstrates an increase  in activity levels.  His P wave was 1.0 with an impedance of 432, a  threshold of 0.5 at 0.3, and there is no intrinsic ventricular rhythm.  The RV lead impedance was 472 with a threshold of 1 V at 0.2.  The LV  impedance was 488 with a threshold of 1 V at 0.2.  There were no  intercurrent episodes.   IMPRESSION:  1. Cardiomyopathy, ischemic.  2. Congestive heart failure with left bundle branch block.  3. Status post implantable cardioverter-defibrillator implantation for      the above with a Medtronic CRT with significant improvement in left      ventricular function.  4. Hypertension, poorly controlled.   Lisbeth [name], [name] should get an echo when you see him next.  If you  would forward a copy of that to [place], we would appreciate it.   I have taken the liberty of increasing his Coreg from 18.75 mg to 25 mg  b.i.d.   I have also encouraged him to follow up with his shoulder exercises.   I hope this letter finds you  well.    Sincerely,      Duke Salvia, MD, [facility]  Electronically Signed    SCK/MedQ  DD: 12/29/2006  DT: 12/29/2006  Job #: [id number]   CC:    Nadine Counts

## 2011-04-04 NOTE — Assessment & Plan Note (Signed)
[facility]                            CARDIOLOGY OFFICE NOTE   NAME:[name], [name]                       MRN:          [id number]  DATE:10/30/2006                            DOB:          12-22-1945    PRIMARY CARE PHYSICIAN:  Dr. Nadine Counts   REASON FOR PRESENTATION:  Evaluate the patient for ischemic  cardiomyopathy.   HISTORY OF PRESENT ILLNESS:  The patient presents for followup of his  ischemic cardiomyopathy.  At the last visit, I was able to add 3.125  twice a day to his Coreg and he has been taking a total of 15.625 mg  b.i.d.  He had no problems with this.  He did not have any light-  headedness, presyncope or syncope.  He has had no shortness of breath  and he has had no PND or orthopnea.  He has not had any chest pain.  He  has had some congestion and discomfort in the top of his head recently.  This is bothering him.   PAST MEDICAL HISTORY:  Ischemic cardiomyopathy (last EF 15%).  Atrial  flutter, status post TEE guided cardioversion; nonsustained ventricular  tachycardia, chronic left bundle branch block, chronic interstitial lung  disease, history of acute renal insufficiency, coronary artery disease  (total occlusion of the right coronary artery with left to right  collaterals).  Cypher stent placed in the circumflex, July 2003.  Seizure disorder.  Multiple chemical allergies.   ALLERGIES:  PENICILLIN, PRILOSEC, E-MYCIN, CODEINE, AMOXICILLIN,  CLARITIN, FLAGYL, LATEX, CORZIDE.   MEDICATIONS:  Digestive aid, green tea, vitamin C, multivitamin, fish  oil, co-enzyme Q, Digoxin 0.25 mg daily, Lasix 20 mg daily, Coreg 12.5  mg b.i.d., Coreg 3.125 mg b.i.d., potassium 10 mEq b.i.d., aspirin 81 mg  a day, Coumadin, lisinopril 20 mg daily.   REVIEW OF SYSTEMS:  As stated in the HPI, otherwise negative for other  systems.   PHYSICAL EXAMINATION:  The patient is in no distress.  Blood pressure  135/80, heart rate 59 and regular.  NECK:  No jugular venous distention at 45 degrees.  Carotid upstroke  brisk and symmetric.  No bruits, no thyromegaly.  LYMPHATICS:  No lymphadenopathy.  LUNGS:  Clear to auscultation bilaterally.  BACK:  No costovertebral angle tenderness.  CHEST:  Well-healed ICD pocket.  HEART:  PMI not displaced or sustained.  S1 and S2 within normal limits.  No S3, no S4, no murmurs.  ABDOMEN:  Flat, positive bowel sounds, normal in frequency and pitch.  No bruits, rebound, guarding, no midline pulse or mass, no splenomegaly.  SKIN:  No rashes, no nodules.  EXTREMITIES:  2+ pulses, no edema.   ASSESSMENT AND PLAN:  1. Cardiomyopathy:  Today, I will be able to titrate his Coreg to      18.75 mg b.i.d.  I will leave the other medications as listed.  He      has had his labs checked recently.  He is euvolemic.  2. Followup:  I would like to see him in about two months to see if I  can get the 25 mg twice a day dose of Coreg.     [name], MD, [facility]  Electronically Signed    JH/MedQ  DD: 10/30/2006  DT: 10/30/2006  Job #: [id number]   cc:   Nadine Counts

## 2011-04-04 NOTE — Assessment & Plan Note (Signed)
[facility]                           ELECTROPHYSIOLOGY OFFICE NOTE   NAME:[name], [name]                       MRN:          [id number]  DATE:08/12/2006                            DOB:          Jun 10, 1946    [name] is seen today.  He has a history of atrial flutter with a rapid  ventricular response status post atrial flutter ablation.  However, there  has been no significant improvement in his LV systolic function.  Given his  ischemic cardiomyopathy unimproved by control of his rate, his left bundle  branch block and his Class III congestive heart failure, we have discussed  extensively the role of CRT/ICD implantation.  He is agreeable to proceed.  We discussed the potential benefits as well as potential risks including,  but not limited to death, perforation, infection, lead dislodgement and  device malfunction.  He understands these risks and would like to proceed.   His other concern is that he has multiple chemical allergies and would like  to be done early in the morning and be in a single room and we will do our  best to accommodate that.   MEDICATIONS:  Currently include:  1. Lisinopril 20.  2. Digoxin 0.25.  3. Coreg 12.5.  4. Lasix 40.  5. Potassium.  6. Aspirin.  7. Coumadin.   ALLERGIES:  1. PENICILLIN.  2. PRILOSEC.  3. ERYTHROMYCIN.  4. CODEINE.  5. AMOXICILLIN.  6. CLARITIN.  7. FLAGYL.  8. LATEX.  9. CORTIZIDE.   On examination today, [name]' blood pressure was 108/74, his pulse was  70.  LUNGS:  Clear.  HEART SOUNDS:  Regular.  He was wearing his life vest.  EXTREMITIES:  Without edema.   I should note that he had significant nonsustained ventricular tachycardia  on his monitor.            ______________________________  Duke Salvia, MD, [facility]     SCK/MedQ  DD:  08/12/2006  DT:  08/14/2006  Job #:  [id number]   cc:   Macarthur Critchley. Shelva Majestic, M.D.  Nadine Counts

## 2011-04-04 NOTE — H&P (Signed)
[name], [name]                          ACCOUNT NO.:  [id number]   MEDICAL RECORD NO.:  [id number]                   PATIENT TYPE:  INP   LOCATION:  6531                                 FACILITY:  [facility]   PHYSICIAN:  [name], M.D.                DATE OF BIRTH:  1946-06-07   DATE OF ADMISSION:  10/27/2003  DATE OF DISCHARGE:                                HISTORY & PHYSICAL   PRIMARY CARE PHYSICIAN:  Dr. Nadine Counts.   REASON FOR PRESENTATION:  Evaluate patient with presyncope.   HISTORY OF PRESENT ILLNESS:  The patient is a pleasant 65 year old gentleman  who I know well.  He has coronary disease and ischemic cardiomyopathy as  described below.  He had been doing relatively well.  I had been seeing him  most recently for atrial fibrillation.  He had not been noticing any  paroxysms of this.  He was bothered in the past 24 hours by leg and hip  pain.  He has had the leg pain chronically, describes a cramping.  However,  the hip pain he has had rarely in the past.  He describes a sharp discomfort  that radiated from his hip down into his leg.  It kept him up most of the  night.  He has been trying heat compresses and certain medications such as  magnesium and vinegar and honey and other home remedies.  He has been  fatigued today because of this.  He was sitting at a kitchen table with his  head in his hands.  He put his head up somewhat suddenly and felt  presyncopal.  He said he did not lose consciousness.  He was told that he  appeared pale.  He was helped to a couch.  His wife did call EMS.  There was  no seizure activity and again, he denies losing consciousness.  He had no  loss of bowel or bladder.  He did not feel any palpitations and had no chest  pain.  When EMS got here, they stood him up and he again felt weak.  There  is no record of any blood pressures or recordings there.  They felt for  precaution, he needed to come to the emergency room.   In the ER he  has been normotensive.  He has had normal sinus rhythm.  He  currently feels back to baseline.  He is anxious.  He denies any chest  discomfort, neck discomfort, arm discomfort, activity-induced nausea,  vomiting, or excessive diaphoresis.  He has had no shortness of breath and  denies PND or orthopnea.   PAST MEDICAL HISTORY:  1. Coronary artery disease (total occlusion of right coronary artery with     left-to-right collaterals; CYPHER stent placement to the circumflex     vessel in July 2003; he was last hospitalized here in August and had a     Cardiolite which  demonstrated an EF of 31% but no ischemia), nonsustained     ventricular tachycardia (evaluated by Dr. Duke Salvia in the past     without plans for further evaluation).  2. Previous seizure disorder.  3. Hypertension.  4. Multiple chemical allergies.  5. Anxiety.  6. Left inguinal hernia repair.   ALLERGIES:  PENICILLIN, FLAGYL, CODEINE and multiple chemical allergies.  He  is allergic to LATEX.   MEDICATIONS:  1. Metoprolol 25 mg b.i.d.  2. Lisinopril 10 mg daily.  3. Aspirin 81 mg daily.   SOCIAL HISTORY:  He lives in [place] with his wife.  He is disabled.  He has never smoked cigarettes.  He occasionally drinks wine.   FAMILY HISTORY:  Family history is noncontributory for early coronary artery  disease in first-degree relatives, though he has later-onset heart disease  with his father dying at 67 of an MI and his mother dying at 20 of heart  trouble.   REVIEW OF SYSTEMS:  Review of systems were as stated in the HPI and positive  for reflux and chronic abdominal pain.   PHYSICAL EXAMINATION:  GENERAL:  The patient is in no distress.  VITAL SIGNS:  Blood pressure 152/79, heart rate 68 and regular.  HEENT:  Eyelids unremarkable; pupils are equal, round and reactive to light;  fundi not visualized; oral mucosa unremarkable.  NECK:  No jugular venous distention, wave form within normal limits, carotid   upstrokes brisk and symmetric, no bruits, no thyromegaly.  LYMPHATICS:  No cervical, axillary or inguinal adenopathy.  LUNGS:  Lungs clear to auscultation bilaterally.  BACK:  No costovertebral angle tenderness.  CHEST:  Chest unremarkable.  HEART:  PMI not displaced or sustained, S1 and S2 within normal limits, no  S3, no S4, no murmurs.  ABDOMEN:  Abdomen flat; positive bowel sounds, normal in frequency and  pitch; no bruits, no rebound, no guarding, no midline pulsatile mass, no  hepatomegaly, no splenomegaly.  SKIN:  No rashes, no nodules.  EXTREMITIES:  Pulses 2+ throughout, no edema, no cyanosis, no clubbing.  NEUROLOGIC:  Oriented to person, place and time; cranial nerves II-XII  grossly intact; motor grossly intact.   LABORATORY AND ACCESSORY CLINICAL DATA:  EKG:  Sinus rhythm, rate 70,  leftward axis, intraventricular conduction delay, lateral T wave inversions  with probable repolarization changes, no significant change from previous  EKGs.   Labs:  Hemoglobin 15; BUN 15, creatinine 0.9, sodium 133, potassium 4.3.   Chest x-ray pending.   ASSESSMENT AND PLAN:  1. Presyncope:  The patient had presyncope.  The etiology is not clear.  We     will watch telemetry overnight.  We will rule out a myocardial     infarction.  We will check orthostatic blood pressures.  If the above is     okay and he is ambulating tomorrow, I would consider discharge.  This may     have been related to his fatigue and being up secondary to the leg pain.  2. Ischemic cardiomyopathy:  The patient will continue medications as     listed.  It has taken quite a while to coax him into these drugs, as he     is very reluctant to take prescription medications.  He rather prefers     over-the-counter, herbal and holistic therapies.  Currently, he seems to     be euvolemic.  3. Anxiety is a significant component of this. 4. Multiple chemical allergies.  We will pay attention  to this, especially     the  Latex allergy.  5. Nonsustained ventricular tachycardia.  He has had a history of this but     has had electrophysiological evaluation.  We will need to review this.     In light of more recent data such as the MUSTT trial, would he now be a     candidate for a defibrillator?  I am not clear that he has had an     electrophysiology study.  I doubt that the patient would be interested in     a defibrillator, as he has been reluctant to have multiple therapies in     the past.  6. Atrial fibrillation:  He has had paroxysms of this, though he is in sinus     rhythm.  I discussed with him at length in the past the use of Coumadin.     He was on it briefly and had extensive education in our Coumadin clinic.     He chose to stop this.  7. Coronary artery disease:  We will continue aggressive secondary risk     reduction.                                                [name], M.D.    JH/MEDQ  D:  10/27/2003  T:  10/28/2003  Job:  [id number]   cc:   Nadine Counts  [address]  [place]  [place] [address]  Fax: [phone]

## 2011-04-08 NOTE — Discharge Summary (Signed)
[name], [name] NO.:  [id number]  MEDICAL RECORD NO.:  [id number]           PATIENT TYPE:  O  LOCATION:  6527                         FACILITY:  [facility]  PHYSICIAN:  Duke Salvia, MD, FACCDATE OF BIRTH:  Aug 21, 1946  DATE OF ADMISSION:  03/06/2011 DATE OF DISCHARGE:  03/07/2011                              DISCHARGE SUMMARY   PRIMARY CARDIOLOGIST:  [name], MD, [facility]  PRIMARY CARE PROVIDER:  Duke Salvia primary care.  ELECTROPHYSIOLOGIST:  Duke Salvia, MD, [facility]  DISCHARGE DIAGNOSES: 1. Status post CRTD generator change out with a lead revision. 2. Ischemic cardiomyopathy. 3. Chronic systolic congestive heart failure, ejection fraction of 25%     per echocardiogram in 2009. 4. History of ventricular tachycardia.  SECONDARY DIAGNOSES: 1. History of atrial flutter status post TEE-guided cardioversion, on     chronic anticoagulation with Coumadin. 2. Known left bundle-branch block.3. Chronic drug-induced interstitial lung disorder. 4. Chronic renal insufficiency. 5. Seizures. 6. Coronary artery disease status post Cypher drug-eluting stent to     left circumflex in 2003 with known right coronary artery total with     left-to-right collaterals and no other disease per cardiac     catheterization in 2003.  ALLERGIES:  CODEINE, PENICILLIN, PRILOSEC, NORVASC, FLAGYL, AZITHROMYCIN, LATEX, MAXZIDE, SHELLFISH.  PROCEDURES/DIAGNOSTICS PERFORMED DURING HOSPITALIZATION: 1. Contrast venogram versus explantation of a previously implanted     device with insertion of a new defibrillator lead and insertion of     a new defibrillator pulse generator, lead repair, as well as pocket     revision.     a.     Medtronic [id number] pulse generator, serial number [id number].      Atrial lead was 04540, serial number [id number].  St. Jude [phone]      Active Fixation dual defibrillator lead, serial number [id number].      Preop chest x-ray on March 06, 2011, there were  no acute      cardiopulmonary abnormalities.  Postop chest x-ray on March 07, 2011, showing no evidence of acute pulmonary abnormalities,      specifically no focal consolidation or pleural effusion.  REASON FOR HOSPITALIZATION:  This is a 65 year old Caucasian gentleman with history of congestive heart failure, ischemic cardiomyopathy and a left bundle-branch block who underwent dual-chamber defibrillator implantation with left ventricular lead placement in 2007.  Since that time, the patient has been doing well.  His device has reached ERI and he has a 6949 lead in place, therefore, the patient was scheduled for device change out and lead revision secondary to the recall lead. Risks, benefits and indications were discussed with the patient and he agreed to proceed.  HOSPITAL COURSE:  The patient was brought to the EP Lab by Dr. Graciela Husbands on March 06, 2011.  The patient underwent explantation of the previously implanted device with insertion of a new Medtronic defibrillator pulse generator as well as insertion of a new defibrillator lead and lead repair.  The patient tolerated the procedure well and was taken for overnight observation.  On March 07, 2011, the patient's device was interrogated,  this demonstrated appropriate device function and no changes were made.  The patient's wound site was without signs of hematoma.  Dr. Graciela Husbands evaluated the patient and noted him stable for home.  DISCHARGE LABORATORIES:  INR 2.69.  Pro time 28.7.  DISCHARGE MEDICATIONS: 1. Benadryl 25 mg 1 tablet daily as needed. 2. Calcium, magnesium 1 tablet daily. 3. CoQ10 over-the-counter 1 tablet daily. 4. Colchicine 0.6 mg 1 tablet daily as needed for gout. 5. Coreg CR 80 mg 1 tablet daily. 6. Digoxin 0.125 mg daily. 7. Fish oil 1 tablet daily. 8. Furosemide 40 mg 1 tablet daily. 9. Warfarin 5 mg 1-1-1/2 tablets daily. 10.Lisinopril 20 mg daily. 11.Multivitamin 1 tablet daily. 12.Nitroglycerin  sublingual 0.4 mg 1 tablet under tongue every 5     minutes  as needed up to three doses for chest pain. 13.Potassium chloride 20 mEq daily. 14.Saw palmetto 1 tablet daily. 15.Vitamin C 500 mg 1 tablet daily. 16.Vitamin D3 over-the-counter 1 tablet daily.  FOLLOWUP PLANS AND INSTRUCTIONS: 1. The patient will follow up in the Device Clinic on Mar 19, 2011, at     11 a.m. 2. The patient will follow up in the Coumadin Clinic on Apr 09, 2011,     at 10 a.m. 3. The patient will follow up with Dr. Graciela Husbands on June 20, 2011, at 9     a.m. 4. The patient is to follow the discharge instruction sheet on     activity, no heavy lifting or activity with the left arm for 6-8     weeks.  The patient is not to raise his left arm above his head for     1 week.  No driving for 1 week.  He is     to keep his site clean and dry, do not get the area wet for 1 week.     He is to call our office for any discharge, swelling or bruising.  DURATION OF DISCHARGE:  45 minutes with physician and physician extender time.     Leonette Monarch, [name]   ______________________________ Duke Salvia, MD, [facility]    NB/MEDQ  D:  03/07/2011  T:  03/08/2011  Job:  [id number]  cc:   [name], MD, [facility] Duke Salvia, MD, [facility]  Electronically Signed by Alen Blew P.A. on 03/19/2011 02:54:07 PM Electronically Signed by Sherryl Manges MD [facility] on 04/08/2011 04:22:43 PM

## 2011-04-09 ENCOUNTER — Ambulatory Visit (INDEPENDENT_AMBULATORY_CARE_PROVIDER_SITE_OTHER): Payer: Medicare Other | Admitting: *Deleted

## 2011-04-09 DIAGNOSIS — I4892 Unspecified atrial flutter: Secondary | ICD-10-CM

## 2011-04-09 DIAGNOSIS — I4891 Unspecified atrial fibrillation: Secondary | ICD-10-CM

## 2011-04-09 LAB — POCT INR: INR: 2.5

## 2011-04-23 ENCOUNTER — Ambulatory Visit (INDEPENDENT_AMBULATORY_CARE_PROVIDER_SITE_OTHER): Payer: Medicare Other | Admitting: Cardiology

## 2011-04-23 ENCOUNTER — Encounter: Payer: Self-pay | Admitting: Cardiology

## 2011-04-23 DIAGNOSIS — I2510 Atherosclerotic heart disease of native coronary artery without angina pectoris: Secondary | ICD-10-CM

## 2011-04-23 DIAGNOSIS — I255 Ischemic cardiomyopathy: Secondary | ICD-10-CM

## 2011-04-23 DIAGNOSIS — I2589 Other forms of chronic ischemic heart disease: Secondary | ICD-10-CM

## 2011-04-23 DIAGNOSIS — I4891 Unspecified atrial fibrillation: Secondary | ICD-10-CM

## 2011-04-23 NOTE — Assessment & Plan Note (Signed)
He has done well with respect to this.  No change in therapy is indicated.

## 2011-04-23 NOTE — Assessment & Plan Note (Signed)
The patient has no new sypmtoms.  No further cardiovascular testing is indicated.  We will continue with aggressive risk reduction and meds as listed.  

## 2011-04-23 NOTE — Patient Instructions (Signed)
Continue your current medications Follow up with Dr Antoine Poche in February 2013

## 2011-04-23 NOTE — Assessment & Plan Note (Signed)
He has had no symptomatic dysrhythmias. He continues to tolerate anticoagulation. I will suggest a change in therapy.

## 2011-04-23 NOTE — Progress Notes (Signed)
HPI   The patient presents for followup of his cardiomyopathy. Since I last saw him he had his defibrillator generator change. He apparently had some superficial perhaps IV infection after this. He has some pain in his right knee following this which may have been an exacerbation of gout. He otherwise has had no acute cardiovascular complaints. In particular he denies shortness of breath, PND or orthopnea. He denies palpitations, presyncope or syncope. He denies chest pressure, neck or arm discomfort. He said no weight gain or edema.   Allergies  Allergen Reactions  . Amlodipine Besylate   . Codeine   . Latex   . Metronidazole   . Omeprazole   . Penicillins     Current Outpatient Prescriptions  Medication Sig Dispense Refill  . aspirin 81 MG tablet Take 81 mg by mouth daily.        . Calcium-Magnesium 500-250 MG TABS Take 1 tablet by mouth.        . carvedilol (COREG CR) 80 MG 24 hr capsule Take 80 mg by mouth daily.        Marland Kitchen co-enzyme Q-10 30 MG capsule Take 30 mg by mouth 3 (three) times daily.        . colchicine 0.6 MG tablet Take 0.6 mg by mouth daily.        . digoxin (LANOXIN) 0.125 MG tablet Take 125 mcg by mouth every other day.       . diphenhydrAMINE (BENADRYL) 25 mg capsule Take 25 mg by mouth every 6 (six) hours as needed.        . furosemide (LASIX) 40 MG tablet Take 40 mg by mouth daily.       Marland Kitchen lisinopril (PRINIVIL,ZESTRIL) 20 MG tablet Take 20 mg by mouth daily.        . Multiple Vitamin (MULTIVITAMIN) capsule Take 1 capsule by mouth daily.        . nitroGLYCERIN (NITROSTAT) 0.4 MG SL tablet Place 0.4 mg under the tongue every 5 (five) minutes as needed.        . potassium chloride SA (KLOR-CON M20) 20 MEQ tablet Take 20 mEq by mouth daily.       . saw palmetto 500 MG capsule Take 500 mg by mouth daily.        Marland Kitchen warfarin (JANTOVEN) 5 MG tablet Take 1 tablet (5 mg total) by mouth as directed.  45 tablet  3  . DISCONTD: cephALEXin (KEFLEX) 500 MG capsule 1 tab every 8  hours  21 capsule  0  . DISCONTD: sulfamethoxazole-trimethoprim (SEPTRA DS) 800-160 MG per tablet 2 tabs  Twice daily  For 7 days  28 tablet  0    Past Medical History  Diagnosis Date  . Ischemic cardiomyopathy   . Atrial flutter     s/p TEE guided cardioversion  . Nonsustained ventricular tachycardia   . LBBB (left bundle branch block)   . Chronic drug-induced interstitial lung disorders   . Acute on chronic renal insufficiency   . Coronary artery disease     total occlusion of the right coronary artery, left to right collaterals.  He had Cypher stenting to the circumflex in  July 2003  . Seizure disorder   . AICD (automatic cardioverter/defibrillator) present     Medtronic CRT  . Hyponatremia   . 6949-lead     Past Surgical History  Procedure Date  . Insert / replace / remove pacemaker     AICD medtronic    ROS:  Right  knee pain.  Otherwise as stated in the HPI and negative for all other systems.  PHYSICAL EXAM BP 144/81  Pulse 78  Resp 16  Ht 5\' 10"  (1.778 m)  Wt 186 lb (84.369 kg)  BMI 26.69 kg/m2 GENERAL:  Well appearing HEENT:  Pupils equal round and reactive, fundi not visualized, oral mucosa unremarkable NECK:  No jugular venous distention, waveform within normal limits, carotid upstroke brisk and symmetric, no bruits, no thyromegaly LYMPHATICS:  No cervical, inguinal adenopathy LUNGS:  Clear to auscultation bilaterally BACK:  No CVA tenderness CHEST:  ICD pocket  HEART:  PMI not displaced or sustained,S1 and S2 within normal limits, no S3, no S4, no clicks, no rubs, no murmurs ABD:  Flat, positive bowel sounds normal in frequency in pitch, no bruits, no rebound, no guarding, no midline pulsatile mass, no hepatomegaly, no splenomegaly EXT:  2 plus pulses throughout, no edema, no cyanosis no clubbing SKIN:  No rashes no nodules NEURO:  Cranial nerves II through XII grossly intact, motor grossly intact throughout PSYCH:  Cognitively intact, oriented to person  place and time   EKG:  AV paced rhythm  ASSESSMENT AND PLAN

## 2011-05-07 ENCOUNTER — Ambulatory Visit (INDEPENDENT_AMBULATORY_CARE_PROVIDER_SITE_OTHER): Payer: Medicare Other | Admitting: *Deleted

## 2011-05-07 DIAGNOSIS — I4891 Unspecified atrial fibrillation: Secondary | ICD-10-CM

## 2011-05-07 DIAGNOSIS — I4892 Unspecified atrial flutter: Secondary | ICD-10-CM

## 2011-06-04 ENCOUNTER — Ambulatory Visit (INDEPENDENT_AMBULATORY_CARE_PROVIDER_SITE_OTHER): Payer: Medicare Other | Admitting: *Deleted

## 2011-06-04 DIAGNOSIS — Z7901 Long term (current) use of anticoagulants: Secondary | ICD-10-CM | POA: Insufficient documentation

## 2011-06-04 DIAGNOSIS — I4892 Unspecified atrial flutter: Secondary | ICD-10-CM

## 2011-06-04 DIAGNOSIS — I4891 Unspecified atrial fibrillation: Secondary | ICD-10-CM

## 2011-06-19 ENCOUNTER — Other Ambulatory Visit: Payer: Self-pay | Admitting: Cardiology

## 2011-06-20 ENCOUNTER — Ambulatory Visit (INDEPENDENT_AMBULATORY_CARE_PROVIDER_SITE_OTHER): Payer: Medicare Other | Admitting: Internal Medicine

## 2011-06-20 ENCOUNTER — Encounter: Payer: Self-pay | Admitting: Internal Medicine

## 2011-06-20 ENCOUNTER — Ambulatory Visit (INDEPENDENT_AMBULATORY_CARE_PROVIDER_SITE_OTHER): Payer: Medicare Other | Admitting: *Deleted

## 2011-06-20 DIAGNOSIS — M79606 Pain in leg, unspecified: Secondary | ICD-10-CM | POA: Insufficient documentation

## 2011-06-20 DIAGNOSIS — I4891 Unspecified atrial fibrillation: Secondary | ICD-10-CM

## 2011-06-20 DIAGNOSIS — I2589 Other forms of chronic ischemic heart disease: Secondary | ICD-10-CM

## 2011-06-20 DIAGNOSIS — I4892 Unspecified atrial flutter: Secondary | ICD-10-CM

## 2011-06-20 DIAGNOSIS — Z95810 Presence of automatic (implantable) cardiac defibrillator: Secondary | ICD-10-CM

## 2011-06-20 DIAGNOSIS — M79609 Pain in unspecified limb: Secondary | ICD-10-CM

## 2011-06-20 DIAGNOSIS — I5022 Chronic systolic (congestive) heart failure: Secondary | ICD-10-CM

## 2011-06-20 DIAGNOSIS — M109 Gout, unspecified: Secondary | ICD-10-CM

## 2011-06-20 DIAGNOSIS — I255 Ischemic cardiomyopathy: Secondary | ICD-10-CM

## 2011-06-20 LAB — POCT INR: INR: 2

## 2011-06-20 LAB — BASIC METABOLIC PANEL
BUN: 18 mg/dL (ref 6–23)
Chloride: 103 mEq/L (ref 96–112)
Glucose, Bld: 102 mg/dL — ABNORMAL HIGH (ref 70–99)
Potassium: 4.2 mEq/L (ref 3.5–5.1)

## 2011-06-20 NOTE — Assessment & Plan Note (Signed)
I am not sure of the mechanism of this. The post implantation/cellulitis aggravation suggestive potentially of an immune response. We will check his magnesium and potassium today as he is on diuretics. Will also check a uric acid level.

## 2011-06-20 NOTE — Progress Notes (Signed)
HPI  [name] is a 65 y.o. male Seen in followup for CRT-D implanted for congestive heart failure in the setting of ischemic heart disease. He underwent generator replacement April 2012 with replacement of his 6949 ICD lead.  It was a superficial inflammatory reaction afterwards. He was treated with antibiotics. Since then he describes discomfort in his knees bilaterally as well as pain in his legs which has been worse but predates the procedure. He is on magnesium supplementation. He has also tried taking quinine water with mild improvement. He does not sleep well.  Myoview April 2012 demonstrated no ischemia, inferolateral scar and an ejection fraction of 28%.  Past Medical History  Diagnosis Date  . Ischemic cardiomyopathy   . Atrial flutter     s/p TEE guided cardioversion  . Nonsustained ventricular tachycardia   . LBBB (left bundle branch block)   . Chronic drug-induced interstitial lung disorders   . Acute on chronic renal insufficiency   . Coronary artery disease     total occlusion of the right coronary artery, left to right collaterals.  He had Cypher stenting to the circumflex in  July 2003  . Seizure disorder   . AICD (automatic cardioverter/defibrillator) present     Medtronic CRT  . Hyponatremia   . 6949-lead     Past Surgical History  Procedure Date  . Insert / replace / remove pacemaker     AICD medtronic    Current Outpatient Prescriptions  Medication Sig Dispense Refill  . Calcium-Magnesium 500-250 MG TABS Take 1 tablet by mouth.        . carvedilol (COREG CR) 80 MG 24 hr capsule Take 80 mg by mouth daily.        Marland Kitchen co-enzyme Q-10 30 MG capsule Take 30 mg by mouth 3 (three) times daily.        . colchicine 0.6 MG tablet Take 0.6 mg by mouth daily.        . digoxin (LANOXIN) 0.125 MG tablet Take 125 mcg by mouth every other day.       . diphenhydrAMINE (BENADRYL) 25 mg capsule Take 25 mg by mouth every 6 (six) hours as needed.        . furosemide  (LASIX) 40 MG tablet Take 40 mg by mouth daily.       Marland Kitchen JANTOVEN 5 MG tablet TAKE ONE TABLET AS DIRECTED  45 tablet  3  . lisinopril (PRINIVIL,ZESTRIL) 20 MG tablet Take 20 mg by mouth daily.        . Multiple Vitamin (MULTIVITAMIN) capsule Take 1 capsule by mouth daily.        . nitroGLYCERIN (NITROSTAT) 0.4 MG SL tablet Place 0.4 mg under the tongue every 5 (five) minutes as needed.        . potassium chloride SA (KLOR-CON M20) 20 MEQ tablet Take 20 mEq by mouth daily.       . saw palmetto 500 MG capsule Take 500 mg by mouth daily.        . traMADol (ULTRAM) 50 MG tablet       . warfarin (COUMADIN) 5 MG tablet Take 5 mg by mouth daily.          Allergies  Allergen Reactions  . Amlodipine Besylate   . Codeine   . Latex   . Metronidazole   . Omeprazole   . Penicillins     Review of Systems negative except from HPI and PMH  Physical Exam Well developed and well  nourished in no acute distress HENT normal E scleral and icterus clear Neck Supple JVP flat; carotids brisk and full Clear to ausculation Device pocket is well healed Regular rate and rhythm, no murmurs gallops or rub Soft with active bowel sounds No clubbing cyanosis and edema; There is warmth no joint erythema or watrmth Alert and oriented, grossly normal motor and sensory function Skin Warm and Dry Assessment and  Plan

## 2011-06-20 NOTE — Patient Instructions (Signed)
Your physician recommends that you have lab work today: bmp/magnesium/digoxin/uric acid    Your physician recommends that you continue on your current medications as directed. Please refer to the Current Medication list given to you today.  Remote monitoring is used to monitor your Pacemaker of ICD from home. This monitoring reduces the number of office visits required to check your device to one time per year. It allows [place] to keep an eye on the functioning of your device to ensure it is working properly. You are scheduled for a device check from home on 09/18/11. You may send your transmission at any time that day. If you have a wireless device, the transmission will be sent automatically. After your physician reviews your transmission, you will receive a postcard with your next transmission date.  Your physician wants you to follow-up in: 1 year with Dr. Graciela Husbands. You will receive a reminder letter in the mail two months in advance. If you don't receive a letter, please call our office to schedule the follow-up appointment.

## 2011-06-20 NOTE — Assessment & Plan Note (Addendum)
Stable on current medications  Will check dig level

## 2011-06-20 NOTE — Assessment & Plan Note (Signed)
ssome intercurrent afib detected on his icd; continue warfarin

## 2011-06-20 NOTE — Assessment & Plan Note (Signed)
The patient's device was interrogated.  The information was reviewed. No changes were made in the programming.    

## 2011-06-20 NOTE — Assessment & Plan Note (Signed)
StableCurrent meds

## 2011-07-01 ENCOUNTER — Telehealth: Payer: Self-pay | Admitting: Internal Medicine

## 2011-07-01 NOTE — Telephone Encounter (Signed)
Pt calling for lab results--results given--nt

## 2011-07-01 NOTE — Telephone Encounter (Signed)
Herbert Seta called him yesterday he thinks it was with lab results.  Pt is aware that Herbert Seta is off today. He is returning call

## 2011-07-02 ENCOUNTER — Encounter: Payer: Self-pay | Admitting: *Deleted

## 2011-07-16 ENCOUNTER — Encounter: Payer: Medicare Other | Admitting: *Deleted

## 2011-07-23 ENCOUNTER — Ambulatory Visit (INDEPENDENT_AMBULATORY_CARE_PROVIDER_SITE_OTHER): Payer: Medicare Other | Admitting: *Deleted

## 2011-07-23 DIAGNOSIS — I4892 Unspecified atrial flutter: Secondary | ICD-10-CM

## 2011-07-23 DIAGNOSIS — I4891 Unspecified atrial fibrillation: Secondary | ICD-10-CM

## 2011-07-23 LAB — POCT INR: INR: 2.2

## 2011-08-20 ENCOUNTER — Other Ambulatory Visit: Payer: Self-pay | Admitting: Cardiology

## 2011-08-28 ENCOUNTER — Ambulatory Visit (INDEPENDENT_AMBULATORY_CARE_PROVIDER_SITE_OTHER): Payer: Medicare Other | Admitting: *Deleted

## 2011-08-28 DIAGNOSIS — I4892 Unspecified atrial flutter: Secondary | ICD-10-CM

## 2011-08-28 DIAGNOSIS — I4891 Unspecified atrial fibrillation: Secondary | ICD-10-CM

## 2011-09-17 ENCOUNTER — Other Ambulatory Visit: Payer: Self-pay | Admitting: Cardiology

## 2011-09-18 ENCOUNTER — Ambulatory Visit (INDEPENDENT_AMBULATORY_CARE_PROVIDER_SITE_OTHER): Payer: Medicare Other | Admitting: *Deleted

## 2011-09-18 ENCOUNTER — Encounter: Payer: Self-pay | Admitting: Internal Medicine

## 2011-09-18 ENCOUNTER — Other Ambulatory Visit: Payer: Self-pay | Admitting: Internal Medicine

## 2011-09-18 DIAGNOSIS — I5022 Chronic systolic (congestive) heart failure: Secondary | ICD-10-CM

## 2011-09-18 DIAGNOSIS — I2589 Other forms of chronic ischemic heart disease: Secondary | ICD-10-CM

## 2011-09-18 DIAGNOSIS — Z95810 Presence of automatic (implantable) cardiac defibrillator: Secondary | ICD-10-CM

## 2011-09-19 LAB — REMOTE ICD DEVICE
AL AMPLITUDE: 2 mv
AL IMPEDENCE ICD: 456 Ohm
BAMS-0001: 170 {beats}/min
CHARGE TIME: 8.768 s
RV LEAD AMPLITUDE: 17.9 mv
RV LEAD IMPEDENCE ICD: 475 Ohm
TOT-0001: 1
TOT-0002: 0
TZAT-0001ATACH: 3
TZAT-0001FASTVT: 1
TZAT-0002ATACH: NEGATIVE
TZAT-0012ATACH: 150 ms
TZAT-0012ATACH: 150 ms
TZAT-0012ATACH: 150 ms
TZAT-0012FASTVT: 200 ms
TZAT-0012SLOWVT: 200 ms
TZAT-0012SLOWVT: 200 ms
TZAT-0013SLOWVT: 2
TZAT-0013SLOWVT: 2
TZAT-0018SLOWVT: NEGATIVE
TZAT-0018SLOWVT: NEGATIVE
TZAT-0019ATACH: 6 V
TZAT-0019ATACH: 6 V
TZAT-0019SLOWVT: 8 V
TZAT-0019SLOWVT: 8 V
TZAT-0020ATACH: 1.5 ms
TZAT-0020ATACH: 1.5 ms
TZAT-0020ATACH: 1.5 ms
TZAT-0020SLOWVT: 1.5 ms
TZAT-0020SLOWVT: 1.5 ms
TZON-0003ATACH: 350 ms
TZON-0003SLOWVT: 370 ms
TZON-0003VSLOWVT: 450 ms
TZON-0005SLOWVT: 12
TZST-0001ATACH: 4
TZST-0001ATACH: 5
TZST-0001ATACH: 6
TZST-0001FASTVT: 2
TZST-0001FASTVT: 3
TZST-0001FASTVT: 4
TZST-0001SLOWVT: 4
TZST-0001SLOWVT: 5
TZST-0002ATACH: NEGATIVE
TZST-0002FASTVT: NEGATIVE
TZST-0002FASTVT: NEGATIVE
TZST-0003SLOWVT: 35 J
TZST-0003SLOWVT: 35 J

## 2011-09-22 ENCOUNTER — Other Ambulatory Visit: Payer: Self-pay | Admitting: Cardiology

## 2011-09-24 ENCOUNTER — Ambulatory Visit (INDEPENDENT_AMBULATORY_CARE_PROVIDER_SITE_OTHER): Payer: Medicare Other | Admitting: *Deleted

## 2011-09-24 DIAGNOSIS — Z7901 Long term (current) use of anticoagulants: Secondary | ICD-10-CM

## 2011-09-24 DIAGNOSIS — I4892 Unspecified atrial flutter: Secondary | ICD-10-CM

## 2011-09-24 DIAGNOSIS — I4891 Unspecified atrial fibrillation: Secondary | ICD-10-CM

## 2011-09-25 ENCOUNTER — Encounter: Payer: Self-pay | Admitting: *Deleted

## 2011-09-30 ENCOUNTER — Telehealth: Payer: Self-pay | Admitting: Cardiology

## 2011-09-30 NOTE — Telephone Encounter (Signed)
Pt states meds were not called in correctly, should have been linsinopril 20 mg #45 and furosimide 40 mg #60, to [facility]

## 2011-10-01 MED ORDER — LISINOPRIL 20 MG PO TABS: 20.0000 mg | ORAL_TABLET | ORAL | Status: DC

## 2011-10-01 MED ORDER — FUROSEMIDE 40 MG PO TABS: 40.0000 mg | ORAL_TABLET | Freq: Every day | ORAL | Status: DC

## 2011-10-01 NOTE — Telephone Encounter (Signed)
Lisinopril 20mg  1 po daily Furosemide 40mg  1 po daily

## 2011-10-01 NOTE — Telephone Encounter (Signed)
Pt states he takes and extra lisinopril PRN and furosemide prn. Will send in new RX pt notified

## 2011-10-03 NOTE — Progress Notes (Signed)
icd remote check w/icm  

## 2011-10-16 NOTE — Telephone Encounter (Signed)
Please close encounter

## 2011-11-05 ENCOUNTER — Ambulatory Visit (INDEPENDENT_AMBULATORY_CARE_PROVIDER_SITE_OTHER): Payer: Medicare Other | Admitting: *Deleted

## 2011-11-05 DIAGNOSIS — I4891 Unspecified atrial fibrillation: Secondary | ICD-10-CM

## 2011-11-05 DIAGNOSIS — I4892 Unspecified atrial flutter: Secondary | ICD-10-CM

## 2011-11-05 DIAGNOSIS — Z7901 Long term (current) use of anticoagulants: Secondary | ICD-10-CM

## 2011-11-05 LAB — POCT INR: INR: 2.1

## 2011-11-07 ENCOUNTER — Other Ambulatory Visit: Payer: Self-pay | Admitting: Cardiology

## 2011-11-07 ENCOUNTER — Other Ambulatory Visit: Payer: Self-pay | Admitting: *Deleted

## 2011-11-07 MED ORDER — WARFARIN SODIUM 5 MG PO TABS: ORAL_TABLET | ORAL | Status: DC

## 2011-11-07 NOTE — Telephone Encounter (Signed)
Need problem:  Sample of coreg cr 80 mg. Waiting on part d - medicare insurance to get covered.

## 2011-11-21 ENCOUNTER — Telehealth: Payer: Self-pay | Admitting: Cardiology

## 2011-11-21 NOTE — Telephone Encounter (Signed)
Left samples of coreg at front desk for pt.

## 2011-11-21 NOTE — Telephone Encounter (Signed)
New Msg: pt calling wanting to know if we have samples of coreg 80 mg. Please return pt call to discuss further.

## 2011-11-27 ENCOUNTER — Telehealth: Payer: Self-pay | Admitting: Cardiology

## 2011-11-27 NOTE — Telephone Encounter (Signed)
Send email to company requesting samples.  Pt is aware.

## 2011-11-27 NOTE — Telephone Encounter (Signed)
Pt calling re status of samples we are trying to get from the rep

## 2011-11-28 NOTE — Telephone Encounter (Signed)
Pt aware company is not sampling medication any longer.  His sister is going to give him the money to pay for it this month.  He is still working with the insurance company to get this covered.

## 2011-11-28 NOTE — Telephone Encounter (Signed)
No rep in this area and company is "not sampling Coreg CR at all this year".  Left message for pt to call back to discuss options

## 2011-12-17 ENCOUNTER — Encounter: Payer: Medicare Other | Admitting: *Deleted

## 2011-12-19 DIAGNOSIS — M109 Gout, unspecified: Secondary | ICD-10-CM | POA: Diagnosis not present

## 2011-12-24 ENCOUNTER — Ambulatory Visit (INDEPENDENT_AMBULATORY_CARE_PROVIDER_SITE_OTHER): Payer: Medicare Other | Admitting: Pharmacist

## 2011-12-24 DIAGNOSIS — I4892 Unspecified atrial flutter: Secondary | ICD-10-CM | POA: Diagnosis not present

## 2011-12-24 DIAGNOSIS — Z7901 Long term (current) use of anticoagulants: Secondary | ICD-10-CM | POA: Diagnosis not present

## 2011-12-24 DIAGNOSIS — I4891 Unspecified atrial fibrillation: Secondary | ICD-10-CM | POA: Diagnosis not present

## 2011-12-25 ENCOUNTER — Telehealth: Payer: Self-pay | Admitting: Internal Medicine

## 2011-12-25 ENCOUNTER — Encounter: Payer: Self-pay | Admitting: Internal Medicine

## 2011-12-25 ENCOUNTER — Ambulatory Visit (INDEPENDENT_AMBULATORY_CARE_PROVIDER_SITE_OTHER): Payer: Medicare Other | Admitting: *Deleted

## 2011-12-25 DIAGNOSIS — I5022 Chronic systolic (congestive) heart failure: Secondary | ICD-10-CM | POA: Diagnosis not present

## 2011-12-25 DIAGNOSIS — Z95810 Presence of automatic (implantable) cardiac defibrillator: Secondary | ICD-10-CM | POA: Diagnosis not present

## 2011-12-25 NOTE — Telephone Encounter (Signed)
Spoke with pt in regards to beeping---transmitter was beeping this morning at 5 and then again at 845. Instructed pt to unplug for a couple of minutes and then plug up again. Resend carelink transmission. Pt sent in carelink transmission and all was normal. Pt understands. If transmitter keeps beeping then pt to call technical support.

## 2011-12-25 NOTE — Telephone Encounter (Signed)
New Msg: Pt calling stating that his device was beeping this morning. Pt said he sent transmission of pt device this am around 6am. Pt said device woke him up beeping this morning around 5 am and then beeped again around 8:45am.   Please return pt call to discuss further.

## 2011-12-26 LAB — REMOTE ICD DEVICE
AL AMPLITUDE: 2 mv
AL THRESHOLD: 0.625 V
BATTERY VOLTAGE: 3.1628 V
FVT: 0
LV LEAD IMPEDENCE ICD: 551 Ohm
LV LEAD THRESHOLD: 0.75 V
RV LEAD AMPLITUDE: 18.9 mv
RV LEAD IMPEDENCE ICD: 475 Ohm
RV LEAD THRESHOLD: 0.625 V
TOT-0006: 20120419000000
TZAT-0001SLOWVT: 1
TZAT-0001SLOWVT: 2
TZAT-0002ATACH: NEGATIVE
TZAT-0002ATACH: NEGATIVE
TZAT-0002ATACH: NEGATIVE
TZAT-0002FASTVT: NEGATIVE
TZAT-0011SLOWVT: 10 ms
TZAT-0011SLOWVT: 10 ms
TZAT-0012ATACH: 150 ms
TZAT-0012FASTVT: 200 ms
TZAT-0018ATACH: NEGATIVE
TZAT-0018SLOWVT: NEGATIVE
TZAT-0018SLOWVT: NEGATIVE
TZAT-0019ATACH: 6 V
TZAT-0019ATACH: 6 V
TZAT-0019ATACH: 6 V
TZAT-0019FASTVT: 8 V
TZAT-0019SLOWVT: 8 V
TZAT-0019SLOWVT: 8 V
TZAT-0020ATACH: 1.5 ms
TZAT-0020FASTVT: 1.5 ms
TZAT-0020SLOWVT: 1.5 ms
TZON-0003VSLOWVT: 450 ms
TZON-0004VSLOWVT: 20
TZON-0005SLOWVT: 12
TZST-0001ATACH: 5
TZST-0001FASTVT: 3
TZST-0001FASTVT: 5
TZST-0001SLOWVT: 3
TZST-0001SLOWVT: 5
TZST-0002ATACH: NEGATIVE
TZST-0002ATACH: NEGATIVE
TZST-0002FASTVT: NEGATIVE
TZST-0002FASTVT: NEGATIVE
TZST-0002FASTVT: NEGATIVE
TZST-0003SLOWVT: 25 J
TZST-0003SLOWVT: 35 J
VF: 0

## 2011-12-31 DIAGNOSIS — H43399 Other vitreous opacities, unspecified eye: Secondary | ICD-10-CM | POA: Diagnosis not present

## 2011-12-31 DIAGNOSIS — H43819 Vitreous degeneration, unspecified eye: Secondary | ICD-10-CM | POA: Diagnosis not present

## 2011-12-31 DIAGNOSIS — H524 Presbyopia: Secondary | ICD-10-CM | POA: Diagnosis not present

## 2011-12-31 DIAGNOSIS — H2510 Age-related nuclear cataract, unspecified eye: Secondary | ICD-10-CM | POA: Diagnosis not present

## 2012-01-02 NOTE — Progress Notes (Signed)
ICD remote 

## 2012-01-06 ENCOUNTER — Encounter: Payer: Self-pay | Admitting: *Deleted

## 2012-01-07 ENCOUNTER — Telehealth: Payer: Self-pay | Admitting: Cardiology

## 2012-01-07 NOTE — Telephone Encounter (Signed)
Per pt - on Saturday he had a real bad pain below right shoulder blade,he became "very sweaty and turned white".  He reports having some chest congestion for about 2 weeks but without cough and/or SOB.  His weight is not elevated.  He denies any injury or strenuous activity recently.  Nothing made the pain worse or better.  Arm movement and deep breath does not make any difference.  Pt did not use any SL Ntg for the pain.  He states he does still have the discomfort from time to time but it is not constant.  Pt wants to know if he had a heart attack because that is what people are telling him.  Instructed pt that if he thinks he had a heart attack he should report to the closest  ED.  Pt states he does not want to go to the ED.  Instructed pt to follow up with his PCP there in [place].  He will do ask instructed.

## 2012-01-07 NOTE — Telephone Encounter (Signed)
Pt having some shoulder blade pain since Saturday and wants to talk to someone and make sure he of what to do about it

## 2012-01-08 DIAGNOSIS — I10 Essential (primary) hypertension: Secondary | ICD-10-CM | POA: Diagnosis not present

## 2012-01-08 DIAGNOSIS — IMO0002 Reserved for concepts with insufficient information to code with codable children: Secondary | ICD-10-CM | POA: Diagnosis not present

## 2012-01-21 ENCOUNTER — Ambulatory Visit (INDEPENDENT_AMBULATORY_CARE_PROVIDER_SITE_OTHER): Payer: Medicare Other | Admitting: *Deleted

## 2012-01-21 DIAGNOSIS — I4892 Unspecified atrial flutter: Secondary | ICD-10-CM | POA: Diagnosis not present

## 2012-01-21 DIAGNOSIS — Z7901 Long term (current) use of anticoagulants: Secondary | ICD-10-CM

## 2012-01-21 DIAGNOSIS — I4891 Unspecified atrial fibrillation: Secondary | ICD-10-CM | POA: Diagnosis not present

## 2012-01-21 LAB — POCT INR: INR: 1.7

## 2012-01-22 DIAGNOSIS — M9981 Other biomechanical lesions of cervical region: Secondary | ICD-10-CM | POA: Diagnosis not present

## 2012-01-22 DIAGNOSIS — M999 Biomechanical lesion, unspecified: Secondary | ICD-10-CM | POA: Diagnosis not present

## 2012-01-22 DIAGNOSIS — IMO0002 Reserved for concepts with insufficient information to code with codable children: Secondary | ICD-10-CM | POA: Diagnosis not present

## 2012-01-27 DIAGNOSIS — M999 Biomechanical lesion, unspecified: Secondary | ICD-10-CM | POA: Diagnosis not present

## 2012-01-27 DIAGNOSIS — IMO0002 Reserved for concepts with insufficient information to code with codable children: Secondary | ICD-10-CM | POA: Diagnosis not present

## 2012-01-27 DIAGNOSIS — M9981 Other biomechanical lesions of cervical region: Secondary | ICD-10-CM | POA: Diagnosis not present

## 2012-01-28 DIAGNOSIS — M549 Dorsalgia, unspecified: Secondary | ICD-10-CM | POA: Diagnosis not present

## 2012-01-28 DIAGNOSIS — IMO0002 Reserved for concepts with insufficient information to code with codable children: Secondary | ICD-10-CM | POA: Diagnosis not present

## 2012-01-29 ENCOUNTER — Other Ambulatory Visit: Payer: Self-pay | Admitting: Cardiology

## 2012-01-30 ENCOUNTER — Other Ambulatory Visit: Payer: Self-pay | Admitting: *Deleted

## 2012-01-30 MED ORDER — FUROSEMIDE 40 MG PO TABS: ORAL_TABLET | ORAL | Status: DC

## 2012-02-04 DIAGNOSIS — L570 Actinic keratosis: Secondary | ICD-10-CM | POA: Diagnosis not present

## 2012-02-04 DIAGNOSIS — L821 Other seborrheic keratosis: Secondary | ICD-10-CM | POA: Diagnosis not present

## 2012-02-04 DIAGNOSIS — Z85828 Personal history of other malignant neoplasm of skin: Secondary | ICD-10-CM | POA: Diagnosis not present

## 2012-02-05 ENCOUNTER — Encounter (INDEPENDENT_AMBULATORY_CARE_PROVIDER_SITE_OTHER): Payer: Medicare Other | Admitting: Pharmacist

## 2012-02-05 DIAGNOSIS — Z7901 Long term (current) use of anticoagulants: Secondary | ICD-10-CM

## 2012-02-05 DIAGNOSIS — I4891 Unspecified atrial fibrillation: Secondary | ICD-10-CM

## 2012-02-05 DIAGNOSIS — M9981 Other biomechanical lesions of cervical region: Secondary | ICD-10-CM | POA: Diagnosis not present

## 2012-02-05 DIAGNOSIS — M999 Biomechanical lesion, unspecified: Secondary | ICD-10-CM | POA: Diagnosis not present

## 2012-02-05 DIAGNOSIS — I4892 Unspecified atrial flutter: Secondary | ICD-10-CM | POA: Diagnosis not present

## 2012-02-05 DIAGNOSIS — IMO0002 Reserved for concepts with insufficient information to code with codable children: Secondary | ICD-10-CM | POA: Diagnosis not present

## 2012-02-05 LAB — POCT INR: INR: 2.4

## 2012-03-03 ENCOUNTER — Ambulatory Visit (INDEPENDENT_AMBULATORY_CARE_PROVIDER_SITE_OTHER): Payer: Medicare Other | Admitting: Pharmacist

## 2012-03-03 DIAGNOSIS — Z7901 Long term (current) use of anticoagulants: Secondary | ICD-10-CM | POA: Diagnosis not present

## 2012-03-03 DIAGNOSIS — I4891 Unspecified atrial fibrillation: Secondary | ICD-10-CM | POA: Diagnosis not present

## 2012-03-03 DIAGNOSIS — I4892 Unspecified atrial flutter: Secondary | ICD-10-CM | POA: Diagnosis not present

## 2012-03-25 ENCOUNTER — Encounter: Payer: Self-pay | Admitting: Internal Medicine

## 2012-03-25 ENCOUNTER — Ambulatory Visit (INDEPENDENT_AMBULATORY_CARE_PROVIDER_SITE_OTHER): Payer: Medicare Other | Admitting: *Deleted

## 2012-03-25 DIAGNOSIS — I2589 Other forms of chronic ischemic heart disease: Secondary | ICD-10-CM

## 2012-03-25 DIAGNOSIS — I5022 Chronic systolic (congestive) heart failure: Secondary | ICD-10-CM

## 2012-03-25 DIAGNOSIS — I255 Ischemic cardiomyopathy: Secondary | ICD-10-CM

## 2012-03-26 ENCOUNTER — Telehealth: Payer: Self-pay | Admitting: Internal Medicine

## 2012-03-26 LAB — REMOTE ICD DEVICE
ATRIAL PACING ICD: 68.86 pct
BAMS-0001: 170 {beats}/min
CHARGE TIME: 9.108 s
FVT: 0
LV LEAD THRESHOLD: 0.75 V
PACEART VT: 0
TOT-0001: 1
TZAT-0002ATACH: NEGATIVE
TZAT-0002ATACH: NEGATIVE
TZAT-0004SLOWVT: 8
TZAT-0005SLOWVT: 91 pct
TZAT-0011SLOWVT: 10 ms
TZAT-0011SLOWVT: 10 ms
TZAT-0012ATACH: 150 ms
TZAT-0012ATACH: 150 ms
TZAT-0012FASTVT: 200 ms
TZAT-0012SLOWVT: 200 ms
TZAT-0012SLOWVT: 200 ms
TZAT-0019ATACH: 6 V
TZAT-0019ATACH: 6 V
TZAT-0019SLOWVT: 8 V
TZAT-0019SLOWVT: 8 V
TZAT-0020ATACH: 1.5 ms
TZAT-0020ATACH: 1.5 ms
TZAT-0020ATACH: 1.5 ms
TZAT-0020FASTVT: 1.5 ms
TZON-0003ATACH: 350 ms
TZON-0003SLOWVT: 370 ms
TZST-0001ATACH: 4
TZST-0001FASTVT: 2
TZST-0001FASTVT: 3
TZST-0001SLOWVT: 4
TZST-0002ATACH: NEGATIVE
TZST-0002FASTVT: NEGATIVE
TZST-0002FASTVT: NEGATIVE
TZST-0003SLOWVT: 25 J
TZST-0003SLOWVT: 35 J
VENTRICULAR PACING ICD: 91.98 pct

## 2012-03-26 NOTE — Telephone Encounter (Signed)
Pt was confused about transmission being sent in automatically and not manually. Pt has wireless device so it automatically sends in transmission. Pt aware of this and knows that on date we give him he doesn't have to do anything.

## 2012-03-26 NOTE — Telephone Encounter (Signed)
New msg Pt wants to talk to you about carelink transmission

## 2012-03-29 ENCOUNTER — Other Ambulatory Visit: Payer: Self-pay | Admitting: Cardiology

## 2012-03-29 NOTE — Telephone Encounter (Signed)
Please refill.

## 2012-03-31 ENCOUNTER — Ambulatory Visit (INDEPENDENT_AMBULATORY_CARE_PROVIDER_SITE_OTHER): Payer: Medicare Other | Admitting: Pharmacist

## 2012-03-31 DIAGNOSIS — I4892 Unspecified atrial flutter: Secondary | ICD-10-CM

## 2012-03-31 DIAGNOSIS — I4891 Unspecified atrial fibrillation: Secondary | ICD-10-CM | POA: Diagnosis not present

## 2012-03-31 DIAGNOSIS — Z7901 Long term (current) use of anticoagulants: Secondary | ICD-10-CM

## 2012-04-02 ENCOUNTER — Encounter: Payer: Self-pay | Admitting: *Deleted

## 2012-04-09 NOTE — Progress Notes (Signed)
ICD remote with ICM 

## 2012-04-13 DIAGNOSIS — M9981 Other biomechanical lesions of cervical region: Secondary | ICD-10-CM | POA: Diagnosis not present

## 2012-04-13 DIAGNOSIS — M999 Biomechanical lesion, unspecified: Secondary | ICD-10-CM | POA: Diagnosis not present

## 2012-04-13 DIAGNOSIS — IMO0002 Reserved for concepts with insufficient information to code with codable children: Secondary | ICD-10-CM | POA: Diagnosis not present

## 2012-04-28 ENCOUNTER — Other Ambulatory Visit: Payer: Self-pay | Admitting: Cardiology

## 2012-04-28 MED ORDER — CARVEDILOL PHOSPHATE ER 80 MG PO CP24: 80.0000 mg | ORAL_CAPSULE | Freq: Every day | ORAL | Status: DC

## 2012-05-05 ENCOUNTER — Ambulatory Visit (INDEPENDENT_AMBULATORY_CARE_PROVIDER_SITE_OTHER): Payer: Medicare Other | Admitting: Pharmacist

## 2012-05-05 ENCOUNTER — Ambulatory Visit: Payer: Medicare Other | Admitting: Cardiology

## 2012-05-05 DIAGNOSIS — Z7901 Long term (current) use of anticoagulants: Secondary | ICD-10-CM

## 2012-05-05 DIAGNOSIS — I4892 Unspecified atrial flutter: Secondary | ICD-10-CM | POA: Diagnosis not present

## 2012-05-05 DIAGNOSIS — I4891 Unspecified atrial fibrillation: Secondary | ICD-10-CM | POA: Diagnosis not present

## 2012-05-26 ENCOUNTER — Other Ambulatory Visit: Payer: Self-pay

## 2012-05-26 ENCOUNTER — Ambulatory Visit (INDEPENDENT_AMBULATORY_CARE_PROVIDER_SITE_OTHER): Payer: Medicare Other | Admitting: Cardiology

## 2012-05-26 ENCOUNTER — Encounter: Payer: Self-pay | Admitting: Cardiology

## 2012-05-26 ENCOUNTER — Ambulatory Visit (INDEPENDENT_AMBULATORY_CARE_PROVIDER_SITE_OTHER): Payer: Medicare Other | Admitting: Pharmacist

## 2012-05-26 VITALS — BP 145/90 | HR 67 | Ht 71.0 in | Wt 184.0 lb

## 2012-05-26 DIAGNOSIS — I4891 Unspecified atrial fibrillation: Secondary | ICD-10-CM

## 2012-05-26 DIAGNOSIS — I255 Ischemic cardiomyopathy: Secondary | ICD-10-CM

## 2012-05-26 DIAGNOSIS — I2510 Atherosclerotic heart disease of native coronary artery without angina pectoris: Secondary | ICD-10-CM | POA: Diagnosis not present

## 2012-05-26 DIAGNOSIS — I2589 Other forms of chronic ischemic heart disease: Secondary | ICD-10-CM

## 2012-05-26 DIAGNOSIS — I428 Other cardiomyopathies: Secondary | ICD-10-CM | POA: Diagnosis not present

## 2012-05-26 DIAGNOSIS — R079 Chest pain, unspecified: Secondary | ICD-10-CM | POA: Diagnosis not present

## 2012-05-26 DIAGNOSIS — Z7901 Long term (current) use of anticoagulants: Secondary | ICD-10-CM

## 2012-05-26 DIAGNOSIS — I429 Cardiomyopathy, unspecified: Secondary | ICD-10-CM

## 2012-05-26 DIAGNOSIS — I4892 Unspecified atrial flutter: Secondary | ICD-10-CM

## 2012-05-26 MED ORDER — COLCHICINE 0.6 MG PO TABS: 0.6000 mg | ORAL_TABLET | Freq: Every day | ORAL | Status: DC

## 2012-05-26 NOTE — Progress Notes (Signed)
HPI   The patient presents for followup of his cardiomyopathy.  Since I last saw him he has started to have some pain in his back. This is sharp in under his right shoulder blade.  It does seem to happen sporadically. He  cannot bring it on with activities. It does seem to respond somewhat to movement. He thinks it is vaguely reminiscent of previous cardiac pain. He did have a stress perfusion study last year demonstrating no new areas of ischemia. He's not having any substernal chest pressure, neck or arm discomfort. He's not having any new shortness of breath, PND or orthopnea. He says he has taken nitroglycerin and this seems to at times help the discomfort.  Allergies  Allergen Reactions  . Amlodipine Besylate   . Codeine   . Latex   . Metronidazole   . Omeprazole   . Penicillins     Current Outpatient Prescriptions  Medication Sig Dispense Refill  . Ascorbic Acid (VITAMIN C PO) Take by mouth daily.      . Calcium-Magnesium 500-250 MG TABS Take 1 tablet by mouth.        . carvedilol (COREG CR) 80 MG 24 hr capsule Take 1 capsule (80 mg total) by mouth daily.  30 capsule  2  . co-enzyme Q-10 30 MG capsule Take 30 mg by mouth 3 (three) times daily.        Marland Kitchen COLCRYS 0.6 MG tablet TAKE ONE TABLET BY MOUTH DAILY AS NEEDED  30 tablet  6  . Digestive Enzymes (PAPAYA ENZYME PO) Take by mouth 3 (three) times daily.      . digoxin (LANOXIN) 0.125 MG tablet TAKE 1 TABLET EVERY OTHER DAY  30 tablet  6  . diphenhydrAMINE (BENADRYL) 25 mg capsule Take 25 mg by mouth every 6 (six) hours as needed.        . fish oil-omega-3 fatty acids 1000 MG capsule Take 2 g by mouth daily.      . furosemide (LASIX) 40 MG tablet May take prn as well  60 tablet  6  . Green Tea, Camillia sinensis, (GREEN TEA PO) Take by mouth.      Marland Kitchen lisinopril (PRINIVIL,ZESTRIL) 20 MG tablet Take 1 tablet (20 mg total) by mouth as directed.  45 tablet  6  . Multiple Vitamin (MULTIVITAMIN) capsule Take 1 capsule by mouth daily.         . nitroGLYCERIN (NITROSTAT) 0.4 MG SL tablet Place 0.4 mg under the tongue every 5 (five) minutes as needed.        . potassium chloride SA (K-DUR,KLOR-CON) 20 MEQ tablet TAKE ONE (1) TABLET EACH DAY  30 tablet  6  . saw palmetto 500 MG capsule Take 500 mg by mouth daily.        . traMADol (ULTRAM) 50 MG tablet as needed.       Marland Kitchen VALERIAN ROOT PO Take by mouth daily.      Marland Kitchen warfarin (COUMADIN) 5 MG tablet Take 5 mg by mouth daily.        Marland Kitchen warfarin (JANTOVEN) 5 MG tablet Take as directed by [facility]  45 tablet  3    Past Medical History  Diagnosis Date  . Ischemic cardiomyopathy     Myoview 02/2011  EF 28%Coronary artery disease  (total occlusion of the right coronary artery, left-to-right   . Atrial flutter     s/p TEE guided cardioversion  . Nonsustained ventricular tachycardia   . LBBB (left bundle branch  block)   . Chronic drug-induced interstitial lung disorders   . Acute on chronic renal insufficiency   . Coronary artery disease     total occlusion of the right coronary artery, left to right collaterals.  He had Cypher stenting to the circumflex in  July 2003  . Seizure disorder   . AICD (automatic cardioverter/defibrillator) present     Medtronic CRT  . Hyponatremia   . 6949-lead     replaced 02/2011    Past Surgical History  Procedure Date  . Insert / replace / remove pacemaker     AICD medtronic    ROS:  Right knee pain.  Otherwise as stated in the HPI and negative for all other systems.  PHYSICAL EXAM BP 145/90  Pulse 67  Ht 5\' 11"  (1.803 m)  Wt 184 lb (83.462 kg)  BMI 25.66 kg/m2 GENERAL:  Well appearing HEENT:  Pupils equal round and reactive, fundi not visualized, oral mucosa unremarkable NECK:  No jugular venous distention, waveform within normal limits, carotid upstroke brisk and symmetric, no bruits, no thyromegaly LYMPHATICS:  No cervical, inguinal adenopathy LUNGS:  Clear to auscultation bilaterally BACK:  No CVA tenderness CHEST:  ICD  pocket  HEART:  PMI not displaced or sustained,S1 and S2 within normal limits, no S3, no S4, no clicks, no rubs, no murmurs ABD:  Flat, positive bowel sounds normal in frequency in pitch, no bruits, no rebound, no guarding, no midline pulsatile mass, no hepatomegaly, no splenomegaly EXT:  2 plus pulses throughout, no edema, no cyanosis no clubbing SKIN:  No rashes no nodules NEURO:  Cranial nerves II through XII grossly intact, motor grossly intact throughout PSYCH:  Cognitively intact, oriented to person place and time  EKG:  Atrial fibrillation with ventricular pacing, rate 67. 11/28/2011  ASSESSMENT AND PLAN

## 2012-05-26 NOTE — Telephone Encounter (Signed)
..   Requested Prescriptions   Signed Prescriptions Disp Refills  . colchicine (COLCRYS) 0.6 MG tablet 30 tablet 6    Sig: Take 1 tablet (0.6 mg total) by mouth daily.    Authorizing Provider: [name]    Ordering User: Christella Hartigan, Brandonlee Navis Judie Petit

## 2012-05-26 NOTE — Patient Instructions (Addendum)
The current medical regimen is effective;  continue present plan and medications.  Your physician has requested that you have an echocardiogram. Echocardiography is a painless test that uses sound waves to create images of your heart. It provides your doctor with information about the size and shape of your heart and how well your heart's chambers and valves are working. This procedure takes approximately one hour. There are no restrictions for this procedure.  Follow up with Dr Antoine Poche in 6 weeks.

## 2012-05-27 ENCOUNTER — Encounter: Payer: Self-pay | Admitting: Cardiology

## 2012-05-27 NOTE — Assessment & Plan Note (Signed)
He seems to be euvolemic.  At this point, no change in therapy is indicated.  We have reviewed salt and fluid restrictions.  No further cardiovascular testing is indicated.   

## 2012-05-27 NOTE — Assessment & Plan Note (Signed)
He is in atrial fibrillation. He doesn't really feel this. He tolerates anticoagulation. No change in therapy is indicated.

## 2012-05-27 NOTE — Assessment & Plan Note (Signed)
I think it is unlikely that his current discomfort is related to his coronary disease.  I will check an echocardiogram. Though I don't see any physical evidence of like to exclude any pericardial involvement or effusion. At this point and not planning another cardiovascular perfusion study. However, he will keep me informed about further workup and any change in his symptoms.

## 2012-06-01 ENCOUNTER — Other Ambulatory Visit: Payer: Self-pay

## 2012-06-01 MED ORDER — POTASSIUM CHLORIDE CRYS ER 20 MEQ PO TBCR: 20.0000 meq | EXTENDED_RELEASE_TABLET | Freq: Every day | ORAL | Status: DC

## 2012-06-01 NOTE — Telephone Encounter (Signed)
..   Requested Prescriptions   Signed Prescriptions Disp Refills  . potassium chloride SA (K-DUR,KLOR-CON) 20 MEQ tablet 30 tablet 6    Sig: Take 1 tablet (20 mEq total) by mouth daily.    Authorizing Provider: HOCHREIN, JAMES    Ordering User: Ocean Schildt M   

## 2012-06-02 ENCOUNTER — Telehealth: Payer: Self-pay | Admitting: Cardiology

## 2012-06-02 NOTE — Telephone Encounter (Signed)
Pt has to sometimes take more lisinopril due to different reasons and he is only getting 30 pills now and he needs it back to 45 pills so he doesn't run out

## 2012-06-02 NOTE — Telephone Encounter (Signed)
Spoke with pharmacist - new RX for one tablet or as directed #45 for a 1 month supply.  Left message with wife that it has been handled.

## 2012-06-03 ENCOUNTER — Other Ambulatory Visit: Payer: Self-pay

## 2012-06-03 MED ORDER — NITROGLYCERIN 0.4 MG SL SUBL: 0.4000 mg | SUBLINGUAL_TABLET | SUBLINGUAL | Status: DC | PRN

## 2012-06-03 NOTE — Telephone Encounter (Signed)
..   Requested Prescriptions    No prescriptions requested or ordered in this encounter  .Marland Kitchen Requested Prescriptions   Signed Prescriptions Disp Refills  . nitroGLYCERIN (NITROSTAT) 0.4 MG SL tablet 25 tablet 3    Sig: Place 1 tablet (0.4 mg total) under the tongue every 5 (five) minutes as needed.    Authorizing Provider: [name]    Ordering User: Christella Hartigan, Avian Konigsberg Judie Petit

## 2012-06-10 DIAGNOSIS — IMO0002 Reserved for concepts with insufficient information to code with codable children: Secondary | ICD-10-CM | POA: Diagnosis not present

## 2012-06-10 DIAGNOSIS — M9981 Other biomechanical lesions of cervical region: Secondary | ICD-10-CM | POA: Diagnosis not present

## 2012-06-10 DIAGNOSIS — M999 Biomechanical lesion, unspecified: Secondary | ICD-10-CM | POA: Diagnosis not present

## 2012-06-16 ENCOUNTER — Ambulatory Visit (INDEPENDENT_AMBULATORY_CARE_PROVIDER_SITE_OTHER): Payer: Medicare Other | Admitting: *Deleted

## 2012-06-16 ENCOUNTER — Ambulatory Visit (HOSPITAL_COMMUNITY): Payer: Medicare Other | Attending: Cardiovascular Disease

## 2012-06-16 DIAGNOSIS — I2589 Other forms of chronic ischemic heart disease: Secondary | ICD-10-CM | POA: Diagnosis not present

## 2012-06-16 DIAGNOSIS — I080 Rheumatic disorders of both mitral and aortic valves: Secondary | ICD-10-CM | POA: Diagnosis not present

## 2012-06-16 DIAGNOSIS — I4891 Unspecified atrial fibrillation: Secondary | ICD-10-CM

## 2012-06-16 DIAGNOSIS — I379 Nonrheumatic pulmonary valve disorder, unspecified: Secondary | ICD-10-CM | POA: Diagnosis not present

## 2012-06-16 DIAGNOSIS — I2510 Atherosclerotic heart disease of native coronary artery without angina pectoris: Secondary | ICD-10-CM | POA: Diagnosis not present

## 2012-06-16 DIAGNOSIS — R072 Precordial pain: Secondary | ICD-10-CM | POA: Diagnosis not present

## 2012-06-16 DIAGNOSIS — I509 Heart failure, unspecified: Secondary | ICD-10-CM

## 2012-06-16 DIAGNOSIS — Z7901 Long term (current) use of anticoagulants: Secondary | ICD-10-CM

## 2012-06-16 DIAGNOSIS — R079 Chest pain, unspecified: Secondary | ICD-10-CM

## 2012-06-16 DIAGNOSIS — I4892 Unspecified atrial flutter: Secondary | ICD-10-CM | POA: Diagnosis not present

## 2012-06-16 DIAGNOSIS — I079 Rheumatic tricuspid valve disease, unspecified: Secondary | ICD-10-CM | POA: Diagnosis not present

## 2012-06-16 DIAGNOSIS — I429 Cardiomyopathy, unspecified: Secondary | ICD-10-CM

## 2012-06-16 LAB — POCT INR: INR: 3.2

## 2012-06-16 NOTE — Progress Notes (Signed)
Echocardiogram performed.  

## 2012-06-23 ENCOUNTER — Telehealth: Payer: Self-pay | Admitting: Cardiology

## 2012-06-23 DIAGNOSIS — R1013 Epigastric pain: Secondary | ICD-10-CM | POA: Diagnosis not present

## 2012-06-23 DIAGNOSIS — R109 Unspecified abdominal pain: Secondary | ICD-10-CM | POA: Diagnosis not present

## 2012-06-23 DIAGNOSIS — J40 Bronchitis, not specified as acute or chronic: Secondary | ICD-10-CM | POA: Diagnosis not present

## 2012-06-23 DIAGNOSIS — K3189 Other diseases of stomach and duodenum: Secondary | ICD-10-CM | POA: Diagnosis not present

## 2012-06-23 DIAGNOSIS — R197 Diarrhea, unspecified: Secondary | ICD-10-CM | POA: Diagnosis not present

## 2012-06-23 NOTE — Telephone Encounter (Signed)
Left message for pt that I am in the [place] office today and gave him the phone number.  Told him I will call back tomorrow.

## 2012-06-23 NOTE — Telephone Encounter (Signed)
Reviewed echo results with pt.  He is aware to call back if his symptoms get worse.

## 2012-06-23 NOTE — Telephone Encounter (Signed)
New problem    Returning call back to nurse.   

## 2012-07-07 ENCOUNTER — Ambulatory Visit (INDEPENDENT_AMBULATORY_CARE_PROVIDER_SITE_OTHER): Payer: Medicare Other | Admitting: *Deleted

## 2012-07-07 DIAGNOSIS — I4891 Unspecified atrial fibrillation: Secondary | ICD-10-CM

## 2012-07-07 DIAGNOSIS — Z7901 Long term (current) use of anticoagulants: Secondary | ICD-10-CM

## 2012-07-07 DIAGNOSIS — I4892 Unspecified atrial flutter: Secondary | ICD-10-CM | POA: Diagnosis not present

## 2012-07-13 ENCOUNTER — Encounter: Payer: Self-pay | Admitting: Cardiology

## 2012-07-13 ENCOUNTER — Ambulatory Visit (INDEPENDENT_AMBULATORY_CARE_PROVIDER_SITE_OTHER): Payer: Medicare Other | Admitting: Cardiology

## 2012-07-13 VITALS — BP 140/89 | HR 81 | Ht 70.0 in | Wt 179.4 lb

## 2012-07-13 DIAGNOSIS — I2589 Other forms of chronic ischemic heart disease: Secondary | ICD-10-CM

## 2012-07-13 DIAGNOSIS — I2510 Atherosclerotic heart disease of native coronary artery without angina pectoris: Secondary | ICD-10-CM

## 2012-07-13 DIAGNOSIS — I4891 Unspecified atrial fibrillation: Secondary | ICD-10-CM

## 2012-07-13 DIAGNOSIS — I5022 Chronic systolic (congestive) heart failure: Secondary | ICD-10-CM | POA: Diagnosis not present

## 2012-07-13 DIAGNOSIS — I255 Ischemic cardiomyopathy: Secondary | ICD-10-CM

## 2012-07-13 DIAGNOSIS — I4892 Unspecified atrial flutter: Secondary | ICD-10-CM

## 2012-07-13 NOTE — Patient Instructions (Addendum)
The current medical regimen is effective;  continue present plan and medications.  Your physician has requested that you have a lexiscan myoview. For further information please visit [email or web address]. Please follow instruction sheet, as given.  Follow up in 1 year with Dr Hochrein.  You will receive a letter in the mail 2 months before you are due.  Please call us when you receive this letter to schedule your follow up appointment.  

## 2012-07-13 NOTE — Progress Notes (Signed)
HPI   The patient presents for followup of chest pain.  At the last appointment he was complaining of back discomfort is described in the note. Unfortunately this discomfort continues. I did send him for an echo which demonstrated no change from his previous echo with an EF of about 25%. However, he's quite concerned that this discomfort is similar to his previous angina in some respects. It's happening episodically under his left scapula. It is as described in not increased in severity or frequency. Of note he did have a stress test 4/12 without ischemia.  Allergies  Allergen Reactions  . Amlodipine Besylate   . Codeine   . Latex   . Metronidazole   . Omeprazole   . Penicillins     Current Outpatient Prescriptions  Medication Sig Dispense Refill  . Ascorbic Acid (VITAMIN C PO) Take by mouth daily.      . Calcium-Magnesium 500-250 MG TABS Take 1 tablet by mouth.        . carvedilol (COREG CR) 80 MG 24 hr capsule Take 1 capsule (80 mg total) by mouth daily.  30 capsule  2  . co-enzyme Q-10 30 MG capsule Take 30 mg by mouth 3 (three) times daily.        . colchicine (COLCRYS) 0.6 MG tablet Take 1 tablet (0.6 mg total) by mouth daily.  30 tablet  6  . Digestive Enzymes (PAPAYA ENZYME PO) Take by mouth 3 (three) times daily.      . digoxin (LANOXIN) 0.125 MG tablet TAKE 1 TABLET EVERY OTHER DAY  30 tablet  6  . diphenhydrAMINE (BENADRYL) 25 mg capsule Take 25 mg by mouth every 6 (six) hours as needed.        . fish oil-omega-3 fatty acids 1000 MG capsule Take 2 g by mouth daily.      . furosemide (LASIX) 40 MG tablet May take prn as well  60 tablet  6  . Green Tea, Camillia sinensis, (GREEN TEA PO) Take by mouth.      Marland Kitchen lisinopril (PRINIVIL,ZESTRIL) 20 MG tablet Take 1 tablet (20 mg total) by mouth as directed.  45 tablet  6  . Multiple Vitamin (MULTIVITAMIN) capsule Take 1 capsule by mouth daily.        . nitroGLYCERIN (NITROSTAT) 0.4 MG SL tablet Place 1 tablet (0.4 mg total) under the  tongue every 5 (five) minutes as needed.  25 tablet  3  . potassium chloride SA (K-DUR,KLOR-CON) 20 MEQ tablet Take 1 tablet (20 mEq total) by mouth daily.  30 tablet  6  . saw palmetto 500 MG capsule Take 500 mg by mouth daily.        . traMADol (ULTRAM) 50 MG tablet as needed.       Marland Kitchen VALERIAN ROOT PO Take by mouth daily.      Marland Kitchen warfarin (JANTOVEN) 5 MG tablet Take as directed by [facility]  45 tablet  3  . DISCONTD: warfarin (COUMADIN) 5 MG tablet Take 5 mg by mouth daily.          Past Medical History  Diagnosis Date  . Ischemic cardiomyopathy     Myoview 02/2011  EF 28%  Coronary artery disease  (total occlusion of the right coronary artery, left-to-right )  . Atrial flutter     s/p TEE guided cardioversion  . Nonsustained ventricular tachycardia   . LBBB (left bundle branch block)   . Chronic drug-induced interstitial lung disorders   . Acute on  chronic renal insufficiency   . Coronary artery disease     total occlusion of the right coronary artery, left to right collaterals.  He had Cypher stenting to the circumflex in  July 2003  . Seizure disorder   . AICD (automatic cardioverter/defibrillator) present     Medtronic CRT  . Hyponatremia   . 6949-lead     replaced 02/2011    Past Surgical History  Procedure Date  . Insert / replace / remove pacemaker     AICD medtronic    ROS:  Right knee pain.  Otherwise as stated in the HPI and negative for all other systems.  PHYSICAL EXAM BP 140/89  Pulse 81  Ht 5\' 10"  (1.778 m)  Wt 179 lb 6.4 oz (81.375 kg)  BMI 25.74 kg/m2 GENERAL:  Well appearing HEENT:  Pupils equal round and reactive, fundi not visualized, oral mucosa unremarkable NECK:  No jugular venous distention, waveform within normal limits, carotid upstroke brisk and symmetric, no bruits, no thyromegaly LYMPHATICS:  No cervical, inguinal adenopathy LUNGS:  Clear to auscultation bilaterally BACK:  No CVA tenderness CHEST:  ICD pocket  HEART:  PMI not  displaced or sustained,S1 and S2 within normal limits, no S3, no clicks, no rubs, no murmurs ABD:  Flat, positive bowel sounds normal in frequency in pitch, no bruits, no rebound, no guarding, no midline pulsatile mass, no hepatomegaly, no splenomegaly EXT:  2 plus pulses throughout, no edema, no cyanosis no clubbing SKIN:  No rashes no nodules NEURO:  Cranial nerves II through XII grossly intact, motor grossly intact throughout PSYCH:  Cognitively intact, oriented to person place and time   ASSESSMENT AND PLAN  CAD, UNSPECIFIED SITE -  At this point I think that it is somewhat less likely that his pain is anginal.  However, there are some similarities to previous angina. Therefore, he'll need a screening stress test. This will need to be a  [employer].  FIBRILLATION, ATRIAL  He is in atrial fibrillation. He doesn't really feel this. He tolerates anticoagulation. No change in therapy is indicated.  Ischemic cardiomyopathy -  He seems to be euvolemic. At this point, no change in therapy is indicated. We have reviewed salt and fluid restrictions. No further cardiovascular testing is indicated.

## 2012-07-14 DIAGNOSIS — R197 Diarrhea, unspecified: Secondary | ICD-10-CM | POA: Diagnosis not present

## 2012-07-15 DIAGNOSIS — R197 Diarrhea, unspecified: Secondary | ICD-10-CM | POA: Diagnosis not present

## 2012-07-15 DIAGNOSIS — Z1212 Encounter for screening for malignant neoplasm of rectum: Secondary | ICD-10-CM | POA: Diagnosis not present

## 2012-07-20 DIAGNOSIS — H2510 Age-related nuclear cataract, unspecified eye: Secondary | ICD-10-CM | POA: Diagnosis not present

## 2012-07-20 DIAGNOSIS — H43399 Other vitreous opacities, unspecified eye: Secondary | ICD-10-CM | POA: Diagnosis not present

## 2012-07-20 DIAGNOSIS — H43819 Vitreous degeneration, unspecified eye: Secondary | ICD-10-CM | POA: Diagnosis not present

## 2012-07-23 DIAGNOSIS — R197 Diarrhea, unspecified: Secondary | ICD-10-CM | POA: Diagnosis not present

## 2012-07-23 DIAGNOSIS — R1084 Generalized abdominal pain: Secondary | ICD-10-CM | POA: Diagnosis not present

## 2012-07-26 ENCOUNTER — Other Ambulatory Visit: Payer: Self-pay | Admitting: *Deleted

## 2012-07-26 MED ORDER — CARVEDILOL PHOSPHATE ER 80 MG PO CP24: 80.0000 mg | ORAL_CAPSULE | Freq: Every day | ORAL | Status: DC

## 2012-07-26 MED ORDER — WARFARIN SODIUM 5 MG PO TABS: ORAL_TABLET | ORAL | Status: DC

## 2012-07-30 DIAGNOSIS — R935 Abnormal findings on diagnostic imaging of other abdominal regions, including retroperitoneum: Secondary | ICD-10-CM | POA: Diagnosis not present

## 2012-07-30 DIAGNOSIS — R109 Unspecified abdominal pain: Secondary | ICD-10-CM | POA: Diagnosis not present

## 2012-07-30 DIAGNOSIS — K838 Other specified diseases of biliary tract: Secondary | ICD-10-CM | POA: Diagnosis not present

## 2012-07-30 DIAGNOSIS — J90 Pleural effusion, not elsewhere classified: Secondary | ICD-10-CM | POA: Diagnosis not present

## 2012-08-03 DIAGNOSIS — I10 Essential (primary) hypertension: Secondary | ICD-10-CM | POA: Diagnosis not present

## 2012-08-03 DIAGNOSIS — E860 Dehydration: Secondary | ICD-10-CM | POA: Diagnosis not present

## 2012-08-03 DIAGNOSIS — R197 Diarrhea, unspecified: Secondary | ICD-10-CM | POA: Diagnosis not present

## 2012-08-03 DIAGNOSIS — R110 Nausea: Secondary | ICD-10-CM | POA: Diagnosis not present

## 2012-08-03 DIAGNOSIS — K5732 Diverticulitis of large intestine without perforation or abscess without bleeding: Secondary | ICD-10-CM | POA: Diagnosis not present

## 2012-08-03 DIAGNOSIS — R109 Unspecified abdominal pain: Secondary | ICD-10-CM | POA: Diagnosis not present

## 2012-08-03 DIAGNOSIS — R5381 Other malaise: Secondary | ICD-10-CM | POA: Diagnosis not present

## 2012-08-03 DIAGNOSIS — I4891 Unspecified atrial fibrillation: Secondary | ICD-10-CM | POA: Diagnosis not present

## 2012-08-03 DIAGNOSIS — I509 Heart failure, unspecified: Secondary | ICD-10-CM | POA: Diagnosis not present

## 2012-08-03 DIAGNOSIS — R0602 Shortness of breath: Secondary | ICD-10-CM | POA: Diagnosis not present

## 2012-08-03 LAB — PROTIME-INR: INR: 1.7 — AB (ref 0.9–1.1)

## 2012-08-04 ENCOUNTER — Ambulatory Visit: Payer: Self-pay | Admitting: Cardiology

## 2012-08-04 DIAGNOSIS — I4892 Unspecified atrial flutter: Secondary | ICD-10-CM

## 2012-08-04 DIAGNOSIS — I4891 Unspecified atrial fibrillation: Secondary | ICD-10-CM

## 2012-08-04 DIAGNOSIS — Z7901 Long term (current) use of anticoagulants: Secondary | ICD-10-CM

## 2012-08-10 ENCOUNTER — Telehealth: Payer: Self-pay | Admitting: *Deleted

## 2012-08-10 ENCOUNTER — Ambulatory Visit (INDEPENDENT_AMBULATORY_CARE_PROVIDER_SITE_OTHER): Payer: Medicare Other

## 2012-08-10 DIAGNOSIS — I4892 Unspecified atrial flutter: Secondary | ICD-10-CM

## 2012-08-10 DIAGNOSIS — I4891 Unspecified atrial fibrillation: Secondary | ICD-10-CM

## 2012-08-10 DIAGNOSIS — Z7901 Long term (current) use of anticoagulants: Secondary | ICD-10-CM | POA: Diagnosis not present

## 2012-08-10 NOTE — Telephone Encounter (Signed)
[name] canceled his myoview schedule for 08/16/12. Patient  will call back to re-schedule after he get his other health issues under control. Patient will also like for Pam to call him on tomorrow. He wants to know if it is ok for him to have colonoscopy.

## 2012-08-10 NOTE — Telephone Encounter (Signed)
Per pt - he took an antibiotic in early August and starting having diarrhea about 1 week after. He has not been able to get it under control.  He was seen in the ED in [place] but hey were not able to determine the cause.  He is trying to get scheduled to see GI and was told he would need a colonoscopy.  He wants to know if he is OK to have a colonoscopy if it is needed.

## 2012-08-12 NOTE — Telephone Encounter (Signed)
Pt aware OK to have colonoscopy.  Does not have to stop coumadin for a diagnostic colonoscopy per Dr Antoine Poche

## 2012-08-12 NOTE — Telephone Encounter (Signed)
Yes

## 2012-08-16 ENCOUNTER — Other Ambulatory Visit (HOSPITAL_COMMUNITY): Payer: Medicare Other

## 2012-08-19 ENCOUNTER — Encounter: Payer: Self-pay | Admitting: Gastroenterology

## 2012-08-19 DIAGNOSIS — R197 Diarrhea, unspecified: Secondary | ICD-10-CM | POA: Diagnosis not present

## 2012-08-19 DIAGNOSIS — R5381 Other malaise: Secondary | ICD-10-CM | POA: Diagnosis not present

## 2012-08-19 DIAGNOSIS — R5383 Other fatigue: Secondary | ICD-10-CM | POA: Diagnosis not present

## 2012-08-25 ENCOUNTER — Ambulatory Visit (INDEPENDENT_AMBULATORY_CARE_PROVIDER_SITE_OTHER): Payer: Medicare Other | Admitting: Pharmacist

## 2012-08-25 DIAGNOSIS — Z7901 Long term (current) use of anticoagulants: Secondary | ICD-10-CM

## 2012-08-25 DIAGNOSIS — I4891 Unspecified atrial fibrillation: Secondary | ICD-10-CM

## 2012-08-25 DIAGNOSIS — I4892 Unspecified atrial flutter: Secondary | ICD-10-CM | POA: Diagnosis not present

## 2012-08-25 LAB — POCT INR: INR: 2.4

## 2012-09-01 DIAGNOSIS — R918 Other nonspecific abnormal finding of lung field: Secondary | ICD-10-CM | POA: Diagnosis not present

## 2012-09-01 DIAGNOSIS — R0602 Shortness of breath: Secondary | ICD-10-CM | POA: Diagnosis not present

## 2012-09-01 DIAGNOSIS — R1084 Generalized abdominal pain: Secondary | ICD-10-CM | POA: Diagnosis not present

## 2012-09-02 DIAGNOSIS — J90 Pleural effusion, not elsewhere classified: Secondary | ICD-10-CM | POA: Diagnosis not present

## 2012-09-02 DIAGNOSIS — R1084 Generalized abdominal pain: Secondary | ICD-10-CM | POA: Diagnosis not present

## 2012-09-02 DIAGNOSIS — K7689 Other specified diseases of liver: Secondary | ICD-10-CM | POA: Diagnosis not present

## 2012-09-02 DIAGNOSIS — R319 Hematuria, unspecified: Secondary | ICD-10-CM | POA: Diagnosis not present

## 2012-09-06 DIAGNOSIS — R109 Unspecified abdominal pain: Secondary | ICD-10-CM | POA: Diagnosis not present

## 2012-09-06 DIAGNOSIS — I4891 Unspecified atrial fibrillation: Secondary | ICD-10-CM | POA: Diagnosis not present

## 2012-09-06 DIAGNOSIS — R310 Gross hematuria: Secondary | ICD-10-CM | POA: Diagnosis not present

## 2012-09-07 ENCOUNTER — Encounter: Payer: Self-pay | Admitting: *Deleted

## 2012-09-07 ENCOUNTER — Telehealth: Payer: Self-pay | Admitting: *Deleted

## 2012-09-07 ENCOUNTER — Ambulatory Visit (INDEPENDENT_AMBULATORY_CARE_PROVIDER_SITE_OTHER): Payer: Medicare Other | Admitting: Physician Assistant

## 2012-09-07 ENCOUNTER — Other Ambulatory Visit: Payer: Medicare Other

## 2012-09-07 ENCOUNTER — Telehealth: Payer: Self-pay | Admitting: Gastroenterology

## 2012-09-07 VITALS — BP 122/90 | HR 85 | Ht 70.5 in | Wt 175.0 lb

## 2012-09-07 DIAGNOSIS — R109 Unspecified abdominal pain: Secondary | ICD-10-CM

## 2012-09-07 DIAGNOSIS — R634 Abnormal weight loss: Secondary | ICD-10-CM

## 2012-09-07 DIAGNOSIS — R197 Diarrhea, unspecified: Secondary | ICD-10-CM

## 2012-09-07 MED ORDER — AMBULATORY NON FORMULARY MEDICATION: 250.0000 mg | Status: DC

## 2012-09-07 MED ORDER — SACCHAROMYCES BOULARDII 250 MG PO CAPS: 250.0000 mg | ORAL_CAPSULE | ORAL | Status: DC

## 2012-09-07 NOTE — Patient Instructions (Addendum)
Please purchase Florastor over the counter to take one capsule by mouth twice a day for a month  We have sent the following medications to [facility] for you to pick up at your convenience: Vancomycin. Please take as directed  Your physician has requested that you go to the basement lab work before leaving today  Please use Imodium sparingly  Please make a one month follow up visit with Dr. Rhea Belton or Amy [name], [name]  You have been given a low residue diet today to follow and information about C-Diff Infection

## 2012-09-07 NOTE — Telephone Encounter (Signed)
Wife states the pt is having abdominal pain, reports he is having a lot of pressure and cramping. States the problem started several weeks ago with diarrhea. He was told by his PCP to take Imoodium every 6 hours and his stools have started to form some.Today he is nauseated.Pt rates his pain at about a 7 or 8 on a scale of 1-10. Pt currently scheduled to see Dr. Arlyce Dice 09/15/12 but does not think he can wait. Pt rescheduled to see Mike Gip PA today at 10:30am. Called Dr. Michel Santee Eyk's office at [phone] and requested records be faxed.

## 2012-09-07 NOTE — Telephone Encounter (Signed)
Received call from patient's wife at [facility].  They said the prescription was not ready.  I called [facility] and spoke to a tech and she said they are getting it ready.  The Vanomycin.  I reviewed the prescription with the pharmacist.  The patient said they were not aware the prescription had to be compounded.

## 2012-09-08 ENCOUNTER — Encounter: Payer: Self-pay | Admitting: Physician Assistant

## 2012-09-08 ENCOUNTER — Encounter (HOSPITAL_COMMUNITY): Payer: Self-pay | Admitting: Emergency Medicine

## 2012-09-08 ENCOUNTER — Emergency Department (HOSPITAL_COMMUNITY): Payer: Medicare Other

## 2012-09-08 ENCOUNTER — Telehealth: Payer: Self-pay | Admitting: Physician Assistant

## 2012-09-08 ENCOUNTER — Emergency Department (HOSPITAL_COMMUNITY)
Admission: EM | Admit: 2012-09-08 | Discharge: 2012-09-08 | Disposition: A | Discharge status: Home / Self Care | Payer: Medicare Other | Source: Home / Self Care | Attending: Emergency Medicine | Admitting: Emergency Medicine

## 2012-09-08 DIAGNOSIS — Z79899 Other long term (current) drug therapy: Secondary | ICD-10-CM | POA: Insufficient documentation

## 2012-09-08 DIAGNOSIS — E860 Dehydration: Secondary | ICD-10-CM

## 2012-09-08 DIAGNOSIS — R109 Unspecified abdominal pain: Secondary | ICD-10-CM | POA: Diagnosis not present

## 2012-09-08 DIAGNOSIS — K829 Disease of gallbladder, unspecified: Secondary | ICD-10-CM | POA: Diagnosis present

## 2012-09-08 DIAGNOSIS — R112 Nausea with vomiting, unspecified: Secondary | ICD-10-CM | POA: Diagnosis not present

## 2012-09-08 DIAGNOSIS — N281 Cyst of kidney, acquired: Secondary | ICD-10-CM | POA: Diagnosis not present

## 2012-09-08 DIAGNOSIS — Z8709 Personal history of other diseases of the respiratory system: Secondary | ICD-10-CM | POA: Insufficient documentation

## 2012-09-08 DIAGNOSIS — R630 Anorexia: Secondary | ICD-10-CM | POA: Diagnosis present

## 2012-09-08 DIAGNOSIS — I10 Essential (primary) hypertension: Secondary | ICD-10-CM | POA: Insufficient documentation

## 2012-09-08 DIAGNOSIS — R197 Diarrhea, unspecified: Secondary | ICD-10-CM | POA: Diagnosis present

## 2012-09-08 DIAGNOSIS — Z8669 Personal history of other diseases of the nervous system and sense organs: Secondary | ICD-10-CM | POA: Insufficient documentation

## 2012-09-08 DIAGNOSIS — R1011 Right upper quadrant pain: Secondary | ICD-10-CM | POA: Diagnosis not present

## 2012-09-08 DIAGNOSIS — E785 Hyperlipidemia, unspecified: Secondary | ICD-10-CM | POA: Diagnosis present

## 2012-09-08 DIAGNOSIS — F1010 Alcohol abuse, uncomplicated: Secondary | ICD-10-CM | POA: Insufficient documentation

## 2012-09-08 DIAGNOSIS — I2589 Other forms of chronic ischemic heart disease: Secondary | ICD-10-CM | POA: Diagnosis not present

## 2012-09-08 DIAGNOSIS — K2990 Gastroduodenitis, unspecified, without bleeding: Secondary | ICD-10-CM | POA: Diagnosis not present

## 2012-09-08 DIAGNOSIS — Z452 Encounter for adjustment and management of vascular access device: Secondary | ICD-10-CM | POA: Diagnosis not present

## 2012-09-08 DIAGNOSIS — J8410 Pulmonary fibrosis, unspecified: Secondary | ICD-10-CM | POA: Diagnosis present

## 2012-09-08 DIAGNOSIS — I5022 Chronic systolic (congestive) heart failure: Secondary | ICD-10-CM | POA: Diagnosis not present

## 2012-09-08 DIAGNOSIS — I2510 Atherosclerotic heart disease of native coronary artery without angina pectoris: Secondary | ICD-10-CM | POA: Diagnosis present

## 2012-09-08 DIAGNOSIS — Z95810 Presence of automatic (implantable) cardiac defibrillator: Secondary | ICD-10-CM | POA: Diagnosis not present

## 2012-09-08 DIAGNOSIS — K7689 Other specified diseases of liver: Secondary | ICD-10-CM | POA: Diagnosis present

## 2012-09-08 DIAGNOSIS — R1013 Epigastric pain: Secondary | ICD-10-CM | POA: Diagnosis not present

## 2012-09-08 DIAGNOSIS — I5023 Acute on chronic systolic (congestive) heart failure: Secondary | ICD-10-CM | POA: Diagnosis not present

## 2012-09-08 DIAGNOSIS — R52 Pain, unspecified: Secondary | ICD-10-CM | POA: Diagnosis not present

## 2012-09-08 DIAGNOSIS — I428 Other cardiomyopathies: Secondary | ICD-10-CM | POA: Insufficient documentation

## 2012-09-08 DIAGNOSIS — R1031 Right lower quadrant pain: Secondary | ICD-10-CM | POA: Diagnosis not present

## 2012-09-08 DIAGNOSIS — N189 Chronic kidney disease, unspecified: Secondary | ICD-10-CM | POA: Diagnosis not present

## 2012-09-08 DIAGNOSIS — I259 Chronic ischemic heart disease, unspecified: Secondary | ICD-10-CM | POA: Insufficient documentation

## 2012-09-08 DIAGNOSIS — K551 Chronic vascular disorders of intestine: Secondary | ICD-10-CM | POA: Diagnosis not present

## 2012-09-08 DIAGNOSIS — R5381 Other malaise: Secondary | ICD-10-CM | POA: Diagnosis present

## 2012-09-08 DIAGNOSIS — G40909 Epilepsy, unspecified, not intractable, without status epilepticus: Secondary | ICD-10-CM | POA: Diagnosis present

## 2012-09-08 DIAGNOSIS — D649 Anemia, unspecified: Secondary | ICD-10-CM | POA: Diagnosis not present

## 2012-09-08 DIAGNOSIS — Z7901 Long term (current) use of anticoagulants: Secondary | ICD-10-CM | POA: Diagnosis not present

## 2012-09-08 DIAGNOSIS — I129 Hypertensive chronic kidney disease with stage 1 through stage 4 chronic kidney disease, or unspecified chronic kidney disease: Secondary | ICD-10-CM | POA: Diagnosis present

## 2012-09-08 DIAGNOSIS — I509 Heart failure, unspecified: Secondary | ICD-10-CM | POA: Diagnosis not present

## 2012-09-08 DIAGNOSIS — N181 Chronic kidney disease, stage 1: Secondary | ICD-10-CM | POA: Diagnosis present

## 2012-09-08 DIAGNOSIS — R918 Other nonspecific abnormal finding of lung field: Secondary | ICD-10-CM | POA: Diagnosis not present

## 2012-09-08 DIAGNOSIS — I4892 Unspecified atrial flutter: Secondary | ICD-10-CM | POA: Diagnosis present

## 2012-09-08 DIAGNOSIS — E871 Hypo-osmolality and hyponatremia: Secondary | ICD-10-CM | POA: Diagnosis not present

## 2012-09-08 DIAGNOSIS — R634 Abnormal weight loss: Secondary | ICD-10-CM | POA: Diagnosis not present

## 2012-09-08 DIAGNOSIS — R7989 Other specified abnormal findings of blood chemistry: Secondary | ICD-10-CM | POA: Diagnosis not present

## 2012-09-08 DIAGNOSIS — N179 Acute kidney failure, unspecified: Secondary | ICD-10-CM | POA: Diagnosis not present

## 2012-09-08 DIAGNOSIS — R319 Hematuria, unspecified: Secondary | ICD-10-CM | POA: Insufficient documentation

## 2012-09-08 DIAGNOSIS — I447 Left bundle-branch block, unspecified: Secondary | ICD-10-CM | POA: Diagnosis present

## 2012-09-08 DIAGNOSIS — Z8679 Personal history of other diseases of the circulatory system: Secondary | ICD-10-CM | POA: Insufficient documentation

## 2012-09-08 DIAGNOSIS — I4891 Unspecified atrial fibrillation: Secondary | ICD-10-CM | POA: Diagnosis not present

## 2012-09-08 DIAGNOSIS — N289 Disorder of kidney and ureter, unspecified: Secondary | ICD-10-CM | POA: Diagnosis not present

## 2012-09-08 LAB — URINALYSIS, MICROSCOPIC ONLY
Leukocytes, UA: NEGATIVE
Nitrite: NEGATIVE
Specific Gravity, Urine: 1.011 (ref 1.005–1.030)
Urobilinogen, UA: 0.2 mg/dL (ref 0.0–1.0)

## 2012-09-08 LAB — COMPREHENSIVE METABOLIC PANEL
ALT: 140 U/L — ABNORMAL HIGH (ref 0–53)
Calcium: 8.9 mg/dL (ref 8.4–10.5)
Creatinine, Ser: 1.16 mg/dL (ref 0.50–1.35)
GFR calc Af Amer: 73 mL/min — ABNORMAL LOW (ref >90)
Glucose, Bld: 98 mg/dL (ref 70–99)
Sodium: 123 mEq/L — ABNORMAL LOW (ref 135–145)
Total Protein: 7.8 g/dL (ref 6.0–8.3)

## 2012-09-08 LAB — DIGOXIN LEVEL: Digoxin Level: 0.5 ng/mL — ABNORMAL LOW (ref 0.8–2.0)

## 2012-09-08 LAB — CBC WITH DIFFERENTIAL/PLATELET
Eosinophils Absolute: 0 10*3/uL (ref 0.0–0.7)
Eosinophils Relative: 0 % (ref 0–5)
Lymphs Abs: 1 10*3/uL (ref 0.7–4.0)
MCH: 32.8 pg (ref 26.0–34.0)
MCV: 94.6 fL (ref 78.0–100.0)
Platelets: 225 10*3/uL (ref 150–400)
RBC: 3.69 MIL/uL — ABNORMAL LOW (ref 4.22–5.81)
RDW: 14.5 % (ref 11.5–15.5)

## 2012-09-08 LAB — LIPASE, BLOOD: Lipase: 18 U/L (ref 11–59)

## 2012-09-08 MED ORDER — OXYCODONE-ACETAMINOPHEN 5-325 MG PO TABS
1.0000 | ORAL_TABLET | Freq: Once | ORAL | Status: AC
  Administered 2012-09-08: 1 via ORAL
  Filled 2012-09-08: qty 1

## 2012-09-08 MED ORDER — OXYCODONE-ACETAMINOPHEN 5-325 MG PO TABS: 2.0000 | ORAL_TABLET | ORAL | Status: DC | PRN

## 2012-09-08 MED ORDER — MORPHINE SULFATE 2 MG/ML IJ SOLN
2.0000 mg | Freq: Once | INTRAMUSCULAR | Status: AC
  Administered 2012-09-08: 2 mg via INTRAVENOUS
  Filled 2012-09-08: qty 1

## 2012-09-08 MED ORDER — AMBULATORY NON FORMULARY MEDICATION: 250.0000 mg | Status: DC

## 2012-09-08 MED ORDER — SODIUM CHLORIDE 0.9 % IV BOLUS (SEPSIS)
1000.0000 mL | Freq: Once | INTRAVENOUS | Status: AC
  Administered 2012-09-08: 1000 mL via INTRAVENOUS

## 2012-09-08 MED ORDER — ONDANSETRON HCL 4 MG/2ML IJ SOLN
4.0000 mg | Freq: Once | INTRAMUSCULAR | Status: AC
  Administered 2012-09-08: 4 mg via INTRAVENOUS
  Filled 2012-09-08: qty 2

## 2012-09-08 MED ORDER — VANCOMYCIN 50 MG/ML ORAL SOLUTION
125.0000 mg | Freq: Four times a day (QID) | ORAL | Status: AC
  Administered 2012-09-08: 125 mg via ORAL
  Filled 2012-09-08: qty 2.5

## 2012-09-08 MED ORDER — MORPHINE SULFATE 4 MG/ML IJ SOLN
4.0000 mg | Freq: Once | INTRAMUSCULAR | Status: AC
  Administered 2012-09-08: 4 mg via INTRAVENOUS
  Filled 2012-09-08: qty 1

## 2012-09-08 MED ORDER — ONDANSETRON 4 MG PO TBDP: ORAL_TABLET | ORAL | Status: DC

## 2012-09-08 NOTE — ED Provider Notes (Signed)
History     CSN: [id number]  Arrival date & time 09/08/12  1420   First MD Initiated Contact with Patient 09/08/12 1548      Chief Complaint  Patient presents with  . Abdominal Pain    (Consider location/radiation/quality/duration/timing/severity/associated sxs/prior treatment) The history is provided by the patient and the spouse.  ISA CHONKO is a 66 y.o. male hx of afib on coumadin, CHF, possible C diff 3 months ago here with persistent abdominal pain, hematuria. Intermittent right sided abdominal pain for 3 months. He initially had some diarrhea and finished a course of vancomycin. His diarrhea resolved last week. He saw GI yesterday and had a repeat C diff sent with results pending. However, since yesterday, he had worse pain and wasn't able to sleep. Also frank painless hematuria and now he is urinating very little. No fevers, but wife notes that his temperature is low. He is also persistently vomiting and unable to keep down his food and meds. He had 3 CT ab/pel in the last few months that were normal and several xrays that were nl.    Past Medical History  Diagnosis Date  . Ischemic cardiomyopathy     Myoview 02/2011  EF 28%  Coronary artery disease  (total occlusion of the right coronary artery, left-to-right )  . Atrial flutter     s/p TEE guided cardioversion  . Nonsustained ventricular tachycardia   . LBBB (left bundle branch block)   . Chronic drug-induced interstitial lung disorders   . Acute on chronic renal insufficiency   . Coronary artery disease     total occlusion of the right coronary artery, left to right collaterals.  He had Cypher stenting to the circumflex in  July 2003  . Seizure disorder   . AICD (automatic cardioverter/defibrillator) present     Medtronic CRT  . Hyponatremia   . 6949-lead     replaced 02/2011  . Hyperlipemia   . Hypertension   . Fatty liver     CT  . Family hx of colon cancer     2 SISTERS    Past Surgical History  Procedure  Date  . Insert / replace / remove pacemaker     AICD medtronic  . Hernia repair     Family History  Problem Relation Age of Onset  . Heart disease Mother   . Heart disease Father   . Colon cancer Sister     BOTH SISTERS 1 DESEASED FROM COLON CANCER  . Colon cancer Sister     History  Substance Use Topics  . Smoking status: Never Smoker   . Smokeless tobacco: Never Used  . Alcohol Use: 0.0 oz/week     OCCASIONALLY NOT OFTEN      Review of Systems  Gastrointestinal: Positive for vomiting, abdominal pain and rectal pain.  Genitourinary: Positive for hematuria.  All other systems reviewed and are negative.    Allergies  Ace inhibitors; Amlodipine besylate; Codeine; Corzide; Diovan; Flagyl; Latex; Norvasc; Omeprazole; and Penicillins  Home Medications   Current Outpatient Rx  Name Route Sig Dispense Refill  . CALCIUM-MAGNESIUM 500-250 MG PO TABS Oral Take 1 tablet by mouth daily.     Marland Kitchen CARVEDILOL PHOSPHATE ER 80 MG PO CP24 Oral Take 1 capsule (80 mg total) by mouth daily. 30 capsule 9  . COENZYME Q10 30 MG PO CAPS Oral Take 30 mg by mouth 3 (three) times daily.      . COLCHICINE 0.6 MG PO TABS Oral Take 0.6  mg by mouth daily as needed. For gout flareups.    Marland Kitchen PAPAYA ENZYME PO Oral Take 1 capsule by mouth 3 (three) times daily.     Marland Kitchen DIGOXIN 0.125 MG PO TABS  TAKE 1 TABLET EVERY OTHER DAY 30 tablet 6  . DIPHENHYDRAMINE HCL 25 MG PO CAPS Oral Take 25 mg by mouth every 6 (six) hours as needed. For allergies.    . OMEGA-3 FATTY ACIDS 1000 MG PO CAPS Oral Take 2 g by mouth daily.    . FUROSEMIDE 40 MG PO TABS Oral Take 40 mg by mouth daily as needed. For swelling.    Marland Kitchen GREEN TEA PO Oral Take 1 capsule by mouth daily.     Marland Kitchen LISINOPRIL 20 MG PO TABS Oral Take 20 mg by mouth daily.    . ADULT MULTIVITAMIN W/MINERALS CH Oral Take 1 tablet by mouth daily.    Marland Kitchen NITROGLYCERIN 0.4 MG SL SUBL Sublingual Place 1 tablet (0.4 mg total) under the tongue every 5 (five) minutes as needed.  25 tablet 3  . POTASSIUM CHLORIDE CRYS ER 20 MEQ PO TBCR Oral Take 1 tablet (20 mEq total) by mouth daily. 30 tablet 6  . SACCHAROMYCES BOULARDII 250 MG PO CAPS Oral Take 250 mg by mouth 2 (two) times daily.    . TRAMADOL HCL 50 MG PO TABS Oral Take 50 mg by mouth every 6 (six) hours as needed. For pain.    Marland Kitchen VALERIAN ROOT PO Oral Take 1 capsule by mouth daily.     Marland Kitchen VANCOCIN HCL PO Oral Take 250 mg by mouth 4 (four) times daily.     Marland Kitchen VITAMIN C 500 MG PO TABS Oral Take 500 mg by mouth daily.    . WARFARIN SODIUM 5 MG PO TABS Oral Take 5-7.5 mg by mouth daily. 1 tab daily except 1.5 tabs daily on Mondays, Wednesdays and Fridays.      BP 134/89  Pulse 79  Temp 98.4 F (36.9 C) (Oral)  Resp 16  SpO2 96%  Physical Exam  Nursing note and vitals reviewed. Constitutional: He is oriented to person, place, and time.       Chronically ill, thin, uncomfortable   HENT:  Head: Normocephalic.       MM dry   Eyes: Conjunctivae normal and EOM are normal. Pupils are equal, round, and reactive to light.  Neck: Normal range of motion. Neck supple.  Cardiovascular: Normal rate, regular rhythm and normal heart sounds.   Pulmonary/Chest: Effort normal and breath sounds normal. No respiratory distress. He has no wheezes. He has no rales.  Abdominal: Soft. Bowel sounds are normal.       + diffuse tenderness, worse at RLQ   Musculoskeletal: Normal range of motion.       1+ edema bilateral legs   Neurological: He is alert and oriented to person, place, and time.  Skin: Skin is warm and dry.  Psychiatric: He has a normal mood and affect. His behavior is normal. Judgment and thought content normal.    ED Course  Procedures (including critical care time)  Labs Reviewed  CBC WITH DIFFERENTIAL - Abnormal; Notable for the following:    RBC 3.69 (*)     Hemoglobin 12.1 (*)     HCT 34.9 (*)     Monocytes Relative 14 (*)     All other components within normal limits  COMPREHENSIVE METABOLIC PANEL -  Abnormal; Notable for the following:    Sodium 123 (*)  Chloride 89 (*)     AST 150 (*)     ALT 140 (*)     Total Bilirubin 3.1 (*)     GFR calc non Af Amer 63 (*)     GFR calc Af Amer 73 (*)     All other components within normal limits  URINALYSIS, MICROSCOPIC ONLY - Abnormal; Notable for the following:    APPearance CLOUDY (*)     Hgb urine dipstick SMALL (*)     Protein, ur 30 (*)     Bacteria, UA FEW (*)     Casts HYALINE CASTS (*)     All other components within normal limits  PROTIME-INR - Abnormal; Notable for the following:    Prothrombin Time 24.4 (*)     INR 2.32 (*)     All other components within normal limits  DIGOXIN LEVEL - Abnormal; Notable for the following:    Digoxin Level 0.5 (*)     All other components within normal limits  LIPASE, BLOOD  CLOSTRIDIUM DIFFICILE BY PCR   US Abdomen Complete  09/08/2012  *RADIOLOGY REPORT*  Clinical Data:  Right upper quadrant abdominal pain.  Evaluate for acute cholecystitis.  COMPLETE ABDOMINAL ULTRASOUND  Comparison:  CT of the abdomen and pelvis 09/02/2012.  Findings:  Gallbladder:  No shadowing gallstones or echogenic sludge.  No gallbladder wall thickening or pericholecystic fluid.  Negative sonographic Murphy's sign according to the ultrasound technologist.  Common bile duct:  Normal caliber measuring 6.4 mm in the porta hepatis.  Liver:  Normal size and echotexture without focal parenchymal abnormality.  Patent portal vein with hepatopetal flow.  IVC:  Patent throughout its visualized course in the abdomen.  Pancreas:  The head of the pancreas is unremarkable in appearance. The body and tail could not be visualized secondary to overlying bowel gas.  Spleen:  Normal size and echotexture without focal parenchymal abnormality.3.9 cm in length. The  Right Kidney:  No hydronephrosis.  Well-preserved cortex.  Normal size and parenchymal echotexture. 10.1 cm in length.  In the interpolar region and there is a 4.4 x 4.3 x 3.4 cm  anechoic lesion with associated increased through transmission, compatible with a simple cyst.  An additional simple cyst is also noted in the lower pole measuring 2.9 x 2.7 x 3.4 cm.  Left Kidney:  No hydronephrosis.  Well-preserved cortex.  Normal size and parenchymal echotexture. 10.7 cm in length.  Simple cyst in the lower pole measuring 4.8 x 2.8 x 3.1 cm.  Abdominal aorta:  2.5 mm in diameter proximally and tapers appropriately distally.  IMPRESSION: 1.  No evidence of cholelithiasis or findings to suggest acute cholecystitis. 2.  Multiple simple cysts in the kidneys bilaterally, as above.   Original Report Authenticated By: Florencia Reasons, M.D.    Dg Abd Acute W/chest  09/08/2012  *RADIOLOGY REPORT*  Clinical Data: Abdominal pain.  Nausea and vomiting. Diarrhea.  ACUTE ABDOMEN SERIES (ABDOMEN 2 VIEW & CHEST 1 VIEW)  Comparison: Multiple exams, including 09/01/2012 and 09/02/2012  Findings: Cardiomegaly noted.  AICD in place.  Blunting of both costophrenic angles compatible with small pleural effusions.  Mild linear airspace opacity in the left lower lobe.  Thoracic spondylosis noted.  No free intraperitoneal gas identified.  Gas and stool noted in the proximal colon.  Gas-filled loops of nondilated small bowel noted in the left abdomen. Tiny air fluid levels in the descending colon, in combination with the possible wall thickening in the descending and sigmoid colon  on recent CT scan, may favor colitis.  IMPRESSION:  1.  Tiny locules of gas in the descending colon which otherwise appears small in caliber.  I suspect distal colitis. 2.  Small bilateral pleural effusions, with mild atelectasis in the left lower lobe. 3.  Cardiomegaly.   Original Report Authenticated By: Dellia Cloud, M.D.      1. Abdominal pain   2. Dehydration       MDM  DEJOUR VOS is a 66 y.o. male hx of afib on coumadin, possible C diff here with abdominal pain, dehydration, hematuria. The etiology of his  abdominal pain is uncertain, possibilities include partial SBO (hx of hernia repair), C diff, urinary obstruction, IBS. Will repeat blood work, give IVF, repeat C diff, and place foley.   8:37 PM Patient not in retention. Foley only put out 50 cc clear urine so foley d/c. C diff pending. Labs unremarkable except Na 123 and elevated LFTs. US showed no acute chole but he has multiple simple cysts. Xray showed possible colitis. His pain is controlled with meds. I think he may still have C diff. Recommend continuing PO vanc. Will d/c home with zofran and percocet.         Richardean Canal, MD 09/08/12 2038

## 2012-09-08 NOTE — ED Notes (Signed)
Pt denies feeling any "different" but pain relief since taking Percocet, denies itching, no rashes noted, no changes in breathing.

## 2012-09-08 NOTE — Telephone Encounter (Signed)
Spoke with wife who states pt is so weak and having terrible pain; he is vomiting and cannot take his meds, even his heart meds and he has CHF. He can't urinate, is having chills, but his temp keeps dropping. Pt saw Mike Gip, PA yesterday.  Wife advised to call EMS for pt; he may be septic. She can have him taken to [facility] or here; she will have him brought to [facility] because his Cardiologists are in [place]. Wife stated understanding.

## 2012-09-08 NOTE — Telephone Encounter (Signed)
Pt's wife called back she says the patient is so sick he cant eat or drink and therefore he cant take his meds. He is in a lot of pain.

## 2012-09-08 NOTE — Progress Notes (Addendum)
Subjective:    Patient ID: [name], male    DOB: 02/18/1946, 66 y.o.   MRN: [id number]  HPI [name] is a very nice 66 year old white male, and new to GI today, referred by [facility] primary care. He has multiple serious medical comorbidities, primarily surrounding heart disease with history of an ischemic cardiomyopathy and EF of about 25%, and chronic systolic heart failure. He has an ICD in place and is maintained on chronic Coumadin, with history of atrial fib/flutter. He also has history of chronic renal insufficiency. He is followed by [facility] cardiology. He comes in today for evaluation of 2 month history of persistent diarrhea and lower abdominal pain and cramping. By the patient's history he states that his symptoms all started 1 week after he took a Z-Pak for an upper respiratory infection. He developed severe diarrhea with loose to watery nonbloody stools which were occurring up to 10-12 times per day including nocturnal episodes.Marland Kitchen He was seen by primary care and apparently had stool cultures including C. difficile and by their notes and a celiac lab which was unremarkable. He had CT scan of the abdomen and pelvis done 08/03/2012 which was done without IV contrast and did show small bilateral effusions cardiomegaly a contracted gallbladder with wall of 6.6 mm but no pericholecystic fluid and there was some minimal stranding in the pericolonic fat in the right lower abdomen. He was treated empirically for possible diverticulitis with a course of Cipro which did not improve his symptoms. He was also given Bentyl to use for cramping but did not find that helpful and stopped it. He  had repeat CT scan done 09/02/2012 with IV contrast. This showed cardiomegaly bilateral small pleural effusions right greater than left atherosclerotic calcifications of the aorta as of May and iliac arteries no renal calcifications were noted no gallstone all bladder was contracted , there was a small amount of free  pelvic fluid but no bowel wall thickening etc. other was limited assessment of the sigmoid colon because of underdistention.. Labs were done nontender 16 2013 WBC 6.9 hemoglobin 12.9 hematocrit 38.7 MCV of 98 BUN 18 creatinine 1.5 potassium 4.7 liver function studies normal with the exception of a total bili of 2.4 Patient has most recently been placed on Imodium one every 6 hours just over the past 2-3 days and says that this is slowing down the diarrhea but he continues to feel poorly generally. His energy level is significantly decreased his appetite has been decreased as well and he says that he has lost about 15 pounds since onset of this illness. He has had some nausea and intermittent dry heaves. He says his lower abdominal pain is constant crampy in nature and does not change with by mouth intake. He is currently having 3-4 loose bowel movements per day with regular use of the Imodium. He was also given tramadol to use for pain and has been using this sparingly He has not had prior colonoscopy. Family history is positive for colon cancer in 2 of his sisters, one recently deceased    Review of Systems  Constitutional: Positive for fever, appetite change and unexpected weight change.  HENT: Negative.   Eyes: Negative.   Respiratory: Negative.   Cardiovascular: Negative.   Gastrointestinal: Positive for nausea, abdominal pain and diarrhea.  Genitourinary: Positive for hematuria.  Musculoskeletal: Negative.   Skin: Negative.   Neurological: Positive for weakness.  Hematological: Negative.   Psychiatric/Behavioral: Negative.    Outpatient Prescriptions Prior to Visit  Medication Sig  Dispense Refill  . Ascorbic Acid (VITAMIN C PO) Take by mouth daily.      . Calcium-Magnesium 500-250 MG TABS Take 1 tablet by mouth.        . carvedilol (COREG CR) 80 MG 24 hr capsule Take 1 capsule (80 mg total) by mouth daily.  30 capsule  9  . co-enzyme Q-10 30 MG capsule Take 30 mg by mouth 3 (three)  times daily.        . colchicine (COLCRYS) 0.6 MG tablet Take 1 tablet (0.6 mg total) by mouth daily.  30 tablet  6  . Digestive Enzymes (PAPAYA ENZYME PO) Take by mouth 3 (three) times daily.      . digoxin (LANOXIN) 0.125 MG tablet TAKE 1 TABLET EVERY OTHER DAY  30 tablet  6  . diphenhydrAMINE (BENADRYL) 25 mg capsule Take 25 mg by mouth every 6 (six) hours as needed.        . fish oil-omega-3 fatty acids 1000 MG capsule Take 2 g by mouth daily.      . furosemide (LASIX) 40 MG tablet May take prn as well  60 tablet  6  . Green Tea, Camillia sinensis, (GREEN TEA PO) Take by mouth.      Marland Kitchen lisinopril (PRINIVIL,ZESTRIL) 20 MG tablet Take 1 tablet (20 mg total) by mouth as directed.  45 tablet  6  . Multiple Vitamin (MULTIVITAMIN) capsule Take 1 capsule by mouth daily.        . nitroGLYCERIN (NITROSTAT) 0.4 MG SL tablet Place 1 tablet (0.4 mg total) under the tongue every 5 (five) minutes as needed.  25 tablet  3  . potassium chloride SA (K-DUR,KLOR-CON) 20 MEQ tablet Take 1 tablet (20 mEq total) by mouth daily.  30 tablet  6  . traMADol (ULTRAM) 50 MG tablet as needed.       Marland Kitchen VALERIAN ROOT PO Take by mouth daily.      Marland Kitchen warfarin (JANTOVEN) 5 MG tablet Take as directed by [facility]  45 tablet  3  . dicyclomine (BENTYL) 20 MG tablet Take 20 mg by mouth every 6 (six) hours.      . saw palmetto 500 MG capsule Take 500 mg by mouth daily.         Allergies  Allergen Reactions  . Ace Inhibitors   . Amlodipine Besylate   . Codeine   . Corzide (Nadolol-Bendroflumethiazide)   . Diovan (Valsartan)   . Flagyl (Metronidazole)   . Latex   . Norvasc (Amlodipine Besylate)   . Omeprazole   . Penicillins    Patient Active Problem List  Diagnosis  . HYPONATREMIA  . UNSPECIFIED ANEMIA  . CAD, UNSPECIFIED SITE  . FIBRILLATION, ATRIAL  . ATRIAL FLUTTER  . SYSTOLIC HEART FAILURE, CHRONIC  . RENAL INSUFFICIENCY  . COUGH  . AICD (automatic cardioverter/defibrillator) present  . Ischemic  cardiomyopathy  . Encounter for long-term (current) use of anticoagulants  . Leg pain   History  Substance Use Topics  . Smoking status: Never Smoker   . Smokeless tobacco: Never Used  . Alcohol Use: 0.0 oz/week     OCCASIONALLY NOT OFTEN      Objective:   Physical Exam well-developed older white male somewhat chronically ill-appearing, very pleasant accompanied by his wife. Blood pressure 122/90 pulse 85 height 5 foot 10 weight 175. HEENT; nontraumatic normocephalic EOMI PERRLA sclera anicteric, neck; Supple no JVD, Cardiovascular ;regular rate and rhythm with S1-S2 soft systolic murmur AICD present in chest wall.  Pulmonary; clear bilaterally, Abdomen; soft mild fairly generalized tenderness no bruit heard no guarding or rebound no palpable mass or hepatosplenomegaly, Rectal; exam not done, Extremities; no clubbing cyanosis or edema skin warm and dry, Psych; mood and affect normal and appropriate        Assessment & Plan:  #41 66 year old male with 2 month history of persistent diarrhea and lower abdominal pain of fairly abrupt onset after taking a Z-Pak. Patient now with associated fatigue, anorexia and weight loss He apparently had had stool cultures including a stool study for C. difficile done in September by his primary care and was told these were negative. I do not have results of the stool culture. I am concerned that he has C. difficile which would make the most sense and his history and lack response to other measures. Another consideration would be mesenteric insufficiency given his severe cardiomyopathy, although significant diarrhea is generally not associated with this  #2 positive family history of colon cancer-patient has not had colon surveillance. Recent CT's do not show any evidence of colonic lesion. He is a high risk candidate for sedation and colonoscopy.  #3 chronic anticoagulation with Coumadin #4 ischemic cardiomyopathy EF of 25% and chronic systolic heart  failure #5 status post AICD #6 history of atrial fib/flutter #7 chronic renal insufficiency  Plan; patient is asked to continue to push fluids and soft bland low roughage diet. He will add Ensure or Boost twice daily. Check stool for C. difficile by PCR today We'll start empiric treatment for C. difficile with vancomycin 250 mg by mouth 4 times daily x14 days,  he is becoming debilitated by this illness, and therefore feel that empiric treatment is indicated Add Florastor twice daily x1 month If he fails to respond to treatment for C. difficile, will need to consider colonoscopy. Followup with myself or Dr. Rhea Belton into weeks-patient and wife or where that if his symptoms worsen in any way he will need to be reevaluated, and possibly hospitalized.  Addendum: Reviewed and agree with initial management. [name] Fiedler, MD

## 2012-09-08 NOTE — ED Notes (Signed)
Per EMS-pt c/o abd pain in the right upper and lower quadrants. States that pain is worse when he eats. States that he was treated for C-diff x3 months ago and was on Vancomycin .

## 2012-09-08 NOTE — ED Notes (Signed)
Wife, Bohdi Leeds, home # [phone], cell # [phone].

## 2012-09-08 NOTE — ED Notes (Signed)
Bed:WA08<BR> Expected date:<BR> Expected time:<BR> Means of arrival:<BR> Comments:<BR>

## 2012-09-09 ENCOUNTER — Telehealth: Payer: Self-pay | Admitting: Nurse Practitioner

## 2012-09-09 LAB — CLOSTRIDIUM DIFFICILE BY PCR: Toxigenic C. Difficile by PCR: NOT DETECTED

## 2012-09-09 NOTE — Telephone Encounter (Signed)
Pt called in stating that he has been receiving abx for diarrhea and possible cdiff.  In that setting, he became quite weak yesterday and presented to the ED where he was felt to be dehydrated and given 2 L of saline over a fairly short time.  Today, his weight is up 10lbs from yesterday and he has significant bilat LEE that he has never had before.  He takes lasix 40mg  daily, and did take a dose this AM.  He denies sob, pnd, orthopnea.  His appetite has been bad in the setting of diarrhea but is now improving.  I advised that he may take an additional lasix tonight (creat 1.19 in ED yesterday) and to take his usual dose in the AM.  If he has persistent edema, he can call back for further instruction.  Between his cardiomyopathy, IVF yesterday, and poor PO's (albumin low nl yesterday @ 3.5), he may be more prone to edema right now.  He verbalized understanding and will call back if edema persists.

## 2012-09-10 ENCOUNTER — Encounter (HOSPITAL_COMMUNITY): Payer: Self-pay | Admitting: Emergency Medicine

## 2012-09-10 ENCOUNTER — Telehealth: Payer: Self-pay | Admitting: Internal Medicine

## 2012-09-10 ENCOUNTER — Inpatient Hospital Stay (HOSPITAL_COMMUNITY)
Admission: EM | Admit: 2012-09-10 | Discharge: 2012-09-14 | DRG: 392 | Disposition: A | Discharge status: Home / Self Care | Payer: Medicare Other | Attending: Internal Medicine | Admitting: Internal Medicine

## 2012-09-10 DIAGNOSIS — I4891 Unspecified atrial fibrillation: Secondary | ICD-10-CM | POA: Diagnosis not present

## 2012-09-10 DIAGNOSIS — J8410 Pulmonary fibrosis, unspecified: Secondary | ICD-10-CM | POA: Diagnosis present

## 2012-09-10 DIAGNOSIS — I129 Hypertensive chronic kidney disease with stage 1 through stage 4 chronic kidney disease, or unspecified chronic kidney disease: Secondary | ICD-10-CM | POA: Diagnosis present

## 2012-09-10 DIAGNOSIS — R197 Diarrhea, unspecified: Secondary | ICD-10-CM | POA: Diagnosis present

## 2012-09-10 DIAGNOSIS — Z881 Allergy status to other antibiotic agents status: Secondary | ICD-10-CM

## 2012-09-10 DIAGNOSIS — I2510 Atherosclerotic heart disease of native coronary artery without angina pectoris: Secondary | ICD-10-CM | POA: Diagnosis present

## 2012-09-10 DIAGNOSIS — Q441 Other congenital malformations of gallbladder: Secondary | ICD-10-CM

## 2012-09-10 DIAGNOSIS — D649 Anemia, unspecified: Secondary | ICD-10-CM

## 2012-09-10 DIAGNOSIS — Z885 Allergy status to narcotic agent status: Secondary | ICD-10-CM

## 2012-09-10 DIAGNOSIS — Z9861 Coronary angioplasty status: Secondary | ICD-10-CM

## 2012-09-10 DIAGNOSIS — Z6825 Body mass index (BMI) 25.0-25.9, adult: Secondary | ICD-10-CM

## 2012-09-10 DIAGNOSIS — K7689 Other specified diseases of liver: Secondary | ICD-10-CM | POA: Diagnosis present

## 2012-09-10 DIAGNOSIS — I447 Left bundle-branch block, unspecified: Secondary | ICD-10-CM | POA: Diagnosis present

## 2012-09-10 DIAGNOSIS — G40909 Epilepsy, unspecified, not intractable, without status epilepticus: Secondary | ICD-10-CM | POA: Diagnosis present

## 2012-09-10 DIAGNOSIS — I5023 Acute on chronic systolic (congestive) heart failure: Secondary | ICD-10-CM | POA: Diagnosis present

## 2012-09-10 DIAGNOSIS — Z7901 Long term (current) use of anticoagulants: Secondary | ICD-10-CM

## 2012-09-10 DIAGNOSIS — R52 Pain, unspecified: Secondary | ICD-10-CM

## 2012-09-10 DIAGNOSIS — I5022 Chronic systolic (congestive) heart failure: Secondary | ICD-10-CM | POA: Diagnosis not present

## 2012-09-10 DIAGNOSIS — R109 Unspecified abdominal pain: Secondary | ICD-10-CM

## 2012-09-10 DIAGNOSIS — E871 Hypo-osmolality and hyponatremia: Secondary | ICD-10-CM | POA: Diagnosis not present

## 2012-09-10 DIAGNOSIS — Z800 Family history of malignant neoplasm of digestive organs: Secondary | ICD-10-CM

## 2012-09-10 DIAGNOSIS — Z79899 Other long term (current) drug therapy: Secondary | ICD-10-CM

## 2012-09-10 DIAGNOSIS — N259 Disorder resulting from impaired renal tubular function, unspecified: Secondary | ICD-10-CM

## 2012-09-10 DIAGNOSIS — Z888 Allergy status to other drugs, medicaments and biological substances status: Secondary | ICD-10-CM

## 2012-09-10 DIAGNOSIS — Z7902 Long term (current) use of antithrombotics/antiplatelets: Secondary | ICD-10-CM

## 2012-09-10 DIAGNOSIS — R634 Abnormal weight loss: Secondary | ICD-10-CM | POA: Diagnosis present

## 2012-09-10 DIAGNOSIS — R5381 Other malaise: Secondary | ICD-10-CM | POA: Diagnosis present

## 2012-09-10 DIAGNOSIS — I10 Essential (primary) hypertension: Secondary | ICD-10-CM

## 2012-09-10 DIAGNOSIS — I480 Paroxysmal atrial fibrillation: Secondary | ICD-10-CM | POA: Diagnosis present

## 2012-09-10 DIAGNOSIS — K829 Disease of gallbladder, unspecified: Secondary | ICD-10-CM | POA: Diagnosis present

## 2012-09-10 DIAGNOSIS — N289 Disorder of kidney and ureter, unspecified: Secondary | ICD-10-CM

## 2012-09-10 DIAGNOSIS — R5383 Other fatigue: Secondary | ICD-10-CM | POA: Diagnosis present

## 2012-09-10 DIAGNOSIS — Z880 Allergy status to penicillin: Secondary | ICD-10-CM

## 2012-09-10 DIAGNOSIS — R1011 Right upper quadrant pain: Secondary | ICD-10-CM | POA: Diagnosis present

## 2012-09-10 DIAGNOSIS — E785 Hyperlipidemia, unspecified: Secondary | ICD-10-CM | POA: Diagnosis present

## 2012-09-10 DIAGNOSIS — E860 Dehydration: Secondary | ICD-10-CM

## 2012-09-10 DIAGNOSIS — Z95810 Presence of automatic (implantable) cardiac defibrillator: Secondary | ICD-10-CM

## 2012-09-10 DIAGNOSIS — R630 Anorexia: Secondary | ICD-10-CM | POA: Diagnosis present

## 2012-09-10 DIAGNOSIS — N189 Chronic kidney disease, unspecified: Secondary | ICD-10-CM | POA: Diagnosis present

## 2012-09-10 DIAGNOSIS — R1013 Epigastric pain: Secondary | ICD-10-CM | POA: Diagnosis present

## 2012-09-10 DIAGNOSIS — I509 Heart failure, unspecified: Secondary | ICD-10-CM | POA: Diagnosis present

## 2012-09-10 DIAGNOSIS — I2589 Other forms of chronic ischemic heart disease: Secondary | ICD-10-CM | POA: Diagnosis present

## 2012-09-10 DIAGNOSIS — I4892 Unspecified atrial flutter: Secondary | ICD-10-CM | POA: Diagnosis present

## 2012-09-10 DIAGNOSIS — N179 Acute kidney failure, unspecified: Secondary | ICD-10-CM | POA: Diagnosis present

## 2012-09-10 DIAGNOSIS — N181 Chronic kidney disease, stage 1: Secondary | ICD-10-CM | POA: Diagnosis present

## 2012-09-10 DIAGNOSIS — Z91040 Latex allergy status: Secondary | ICD-10-CM

## 2012-09-10 LAB — CBC WITH DIFFERENTIAL/PLATELET
Basophils Absolute: 0 10*3/uL (ref 0.0–0.1)
HCT: 31.5 % — ABNORMAL LOW (ref 39.0–52.0)
Hemoglobin: 11 g/dL — ABNORMAL LOW (ref 13.0–17.0)
Lymphocytes Relative: 16 % (ref 12–46)
Lymphs Abs: 0.8 10*3/uL (ref 0.7–4.0)
Monocytes Absolute: 0.6 10*3/uL (ref 0.1–1.0)
Neutro Abs: 3.5 10*3/uL (ref 1.7–7.7)
RBC: 3.34 MIL/uL — ABNORMAL LOW (ref 4.22–5.81)
RDW: 14.4 % (ref 11.5–15.5)
WBC: 4.8 10*3/uL (ref 4.0–10.5)

## 2012-09-10 LAB — COMPREHENSIVE METABOLIC PANEL
ALT: 126 U/L — ABNORMAL HIGH (ref 0–53)
AST: 87 U/L — ABNORMAL HIGH (ref 0–37)
CO2: 26 mEq/L (ref 19–32)
Chloride: 95 mEq/L — ABNORMAL LOW (ref 96–112)
Creatinine, Ser: 1.55 mg/dL — ABNORMAL HIGH (ref 0.50–1.35)
GFR calc non Af Amer: 45 mL/min — ABNORMAL LOW (ref >90)
Total Bilirubin: 1.7 mg/dL — ABNORMAL HIGH (ref 0.3–1.2)

## 2012-09-10 LAB — URINALYSIS, MICROSCOPIC ONLY
Glucose, UA: NEGATIVE mg/dL
Hgb urine dipstick: NEGATIVE
Ketones, ur: NEGATIVE mg/dL
Protein, ur: NEGATIVE mg/dL

## 2012-09-10 MED ORDER — ONDANSETRON HCL 4 MG/2ML IJ SOLN: 4.0000 mg | Freq: Four times a day (QID) | INTRAMUSCULAR | Status: DC | PRN

## 2012-09-10 MED ORDER — MORPHINE SULFATE 4 MG/ML IJ SOLN
4.0000 mg | Freq: Once | INTRAMUSCULAR | Status: AC
  Administered 2012-09-10: 4 mg via INTRAVENOUS
  Filled 2012-09-10: qty 1

## 2012-09-10 MED ORDER — SODIUM CHLORIDE 0.9 % IV SOLN
INTRAVENOUS | Status: AC
  Administered 2012-09-10 – 2012-09-11 (×2): via INTRAVENOUS

## 2012-09-10 MED ORDER — FAMOTIDINE 40 MG PO TABS
40.0000 mg | ORAL_TABLET | Freq: Two times a day (BID) | ORAL | Status: DC
  Administered 2012-09-10 – 2012-09-14 (×8): 40 mg via ORAL
  Filled 2012-09-10: qty 2
  Filled 2012-09-10: qty 1
  Filled 2012-09-10 (×3): qty 2
  Filled 2012-09-10: qty 1
  Filled 2012-09-10 (×3): qty 2

## 2012-09-10 MED ORDER — MORPHINE SULFATE 2 MG/ML IJ SOLN
1.0000 mg | INTRAMUSCULAR | Status: DC | PRN
  Administered 2012-09-10 – 2012-09-11 (×3): 1 mg via INTRAVENOUS
  Filled 2012-09-10 (×3): qty 1

## 2012-09-10 MED ORDER — ONDANSETRON HCL 4 MG PO TABS: 4.0000 mg | ORAL_TABLET | Freq: Four times a day (QID) | ORAL | Status: DC | PRN

## 2012-09-10 MED ORDER — SODIUM CHLORIDE 0.9 % IV SOLN
1000.0000 mL | Freq: Once | INTRAVENOUS | Status: AC
  Administered 2012-09-10: 1000 mL via INTRAVENOUS

## 2012-09-10 MED ORDER — BIOTENE DRY MOUTH MT LIQD
15.0000 mL | Freq: Two times a day (BID) | OROMUCOSAL | Status: DC
  Administered 2012-09-11 – 2012-09-14 (×3): 15 mL via OROMUCOSAL

## 2012-09-10 MED ORDER — DIGOXIN 125 MCG PO TABS
0.1250 mg | ORAL_TABLET | ORAL | Status: DC
  Administered 2012-09-12 – 2012-09-14 (×2): 0.125 mg via ORAL
  Filled 2012-09-10 (×2): qty 1

## 2012-09-10 MED ORDER — ONDANSETRON HCL 4 MG/2ML IJ SOLN
4.0000 mg | Freq: Once | INTRAMUSCULAR | Status: AC
  Administered 2012-09-10: 4 mg via INTRAVENOUS
  Filled 2012-09-10: qty 2

## 2012-09-10 MED ORDER — ACETAMINOPHEN 650 MG RE SUPP: 650.0000 mg | Freq: Four times a day (QID) | RECTAL | Status: DC | PRN

## 2012-09-10 MED ORDER — ADULT MULTIVITAMIN W/MINERALS CH
1.0000 | ORAL_TABLET | Freq: Every day | ORAL | Status: DC
  Administered 2012-09-11 – 2012-09-14 (×4): 1 via ORAL
  Filled 2012-09-10 (×4): qty 1

## 2012-09-10 MED ORDER — SODIUM CHLORIDE 0.9 % IV SOLN
Freq: Once | INTRAVENOUS | Status: AC
  Administered 2012-09-13: 500 mL via INTRAVENOUS

## 2012-09-10 MED ORDER — ACETAMINOPHEN 325 MG PO TABS: 650.0000 mg | ORAL_TABLET | Freq: Four times a day (QID) | ORAL | Status: DC | PRN

## 2012-09-10 MED ORDER — CARVEDILOL PHOSPHATE ER 80 MG PO CP24
80.0000 mg | ORAL_CAPSULE | Freq: Every day | ORAL | Status: DC
  Administered 2012-09-11 – 2012-09-14 (×4): 80 mg via ORAL
  Filled 2012-09-10 (×4): qty 1

## 2012-09-10 MED ORDER — CHLORHEXIDINE GLUCONATE 0.12 % MT SOLN
15.0000 mL | Freq: Two times a day (BID) | OROMUCOSAL | Status: DC
  Administered 2012-09-11 – 2012-09-14 (×6): 15 mL via OROMUCOSAL
  Filled 2012-09-10 (×10): qty 15

## 2012-09-10 NOTE — ED Notes (Signed)
Called report.   Per Diplomatic Services operational officer, RN unavailable.  Awaiting RN to call back

## 2012-09-10 NOTE — H&P (Signed)
[facility] History and Physical  [name] ZOX:[id number] DOB: 02/05/1946 DOA: 09/10/2012  Referring physician: Dr. Freida Busman PCP: Crist Fat, MD  Specialists: Dr. Antoine Poche (cardiologist, [facility])                       Dr. Margretta Sidle [facility], gastroenterologist)   Chief Complaint: Abdominal pain,  HPI: [name] is a 66 y.o. male  past medical history significant for atrial fibrillation (on chronic Coumadin), renal insufficiency (stage I), coronary artery disease, chronic systolic heart failure (ejection fraction 20%, status post AICD), hypertension, fatty liver and also significant history of colon cancer in his family; came to the hospital secondary to subacute abdominal pain that has been going on for the last 3 months. Patient reports having significant imaging studies including CT scan, ultrasound, and also x-rays without any particular definitive diagnosis for his problem he also had experienced some diarrhea and has finish treatment for presumed C.diff; despite having negative C. Diff by PCR test twice. He was recently seen (2 days ago) in the emergency department where he was found dehydrated, with elevated LFTs and with ongoing complaints of his abdominal pain. Patient was seen by his primary gastroenterologist today who has refer the patient to the hospital for further evaluation and treatment. In the emergency department on his last visit he received 2 L of fluids as part of the resuscitation process for his dehydration and since then he has notices some swelling of his legs reason why he was instructed to increase the Lasix to double the dose of what he normally takes at home.   Today patient is still complaining of abdominal pain, reports decreased appetite and inability to keep himself well-hydrated do to the discomfort on his abdomen; was found in the ED with hyponatremia (even improve from previous visit), hypochloremia, acute renal failure (creatinine 1.55) and also  with elevated LFT's (also improved from previous visit). [facility] has been called to admit the patient for further evaluation and treatment.   Of note patient denies any hematochezia, hematemesis, fever, chills, cough, shortness of breath, chest pain or any other complaints.    Review of Systems:  Negative except as otherwise mentioned on history of present illness.  Past Medical History  Diagnosis Date  . Ischemic cardiomyopathy     Myoview 02/2011  EF 28%  Coronary artery disease  (total occlusion of the right coronary artery, left-to-right )  . Atrial flutter     s/p TEE guided cardioversion  . Nonsustained ventricular tachycardia   . LBBB (left bundle branch block)   . Chronic drug-induced interstitial lung disorders   . Acute on chronic renal insufficiency   . Coronary artery disease     total occlusion of the right coronary artery, left to right collaterals.  He had Cypher stenting to the circumflex in  July 2003  . Seizure disorder   . AICD (automatic cardioverter/defibrillator) present     Medtronic CRT  . Hyponatremia   . 6949-lead     replaced 02/2011  . Hyperlipemia   . Hypertension   . Fatty liver     CT  . Family hx of colon cancer     2 SISTERS   Past Surgical History  Procedure Date  . Insert / replace / remove pacemaker     AICD medtronic  . Hernia repair    Social History:  reports that he has never smoked. He has never used smokeless tobacco. He reports that  he drinks alcohol. He reports that he does not use illicit drugs. patient came from home, lives with his wife and at this moment do not require any assistance with activities of daily living.   Allergies  Allergen Reactions  . Ace Inhibitors   . Amlodipine Besylate   . Codeine   . Corzide (Nadolol-Bendroflumethiazide)   . Diovan (Valsartan)   . Flagyl (Metronidazole)   . Latex   . Norvasc (Amlodipine Besylate)   . Omeprazole   . Penicillins     Family History  Problem Relation Age  of Onset  . Heart disease Mother   . Heart disease Father   . Colon cancer Sister     BOTH SISTERS 1 DESEASED FROM COLON CANCER  . Colon cancer Sister     Prior to Admission medications   Medication Sig Start Date End Date Taking? Authorizing Provider  Calcium-Magnesium 500-250 MG TABS Take 1 tablet by mouth daily.    Yes Historical Provider, MD  carvedilol (COREG CR) 80 MG 24 hr capsule Take 1 capsule (80 mg total) by mouth daily. 07/26/12  Yes [name], MD  co-enzyme Q-10 30 MG capsule Take 30 mg by mouth 3 (three) times daily.     Yes Historical Provider, MD  colchicine 0.6 MG tablet Take 0.6 mg by mouth daily as needed. For gout flareups.   Yes Historical Provider, MD  Digestive Enzymes (PAPAYA ENZYME PO) Take 1 capsule by mouth 3 (three) times daily.    Yes Historical Provider, MD  digoxin (LANOXIN) 0.125 MG tablet Take 0.125 mg by mouth every other day.   Yes Historical Provider, MD  diphenhydrAMINE (BENADRYL) 25 mg capsule Take 25 mg by mouth every 6 (six) hours as needed. For allergies.   Yes Historical Provider, MD  fish oil-omega-3 fatty acids 1000 MG capsule Take 2 g by mouth daily.   Yes Historical Provider, MD  furosemide (LASIX) 40 MG tablet Take 40 mg by mouth daily. For swelling.   Yes Historical Provider, MD  Chilton Si Tea, Camillia sinensis, (GREEN TEA PO) Take 1 capsule by mouth daily.    Yes Historical Provider, MD  lisinopril (PRINIVIL,ZESTRIL) 20 MG tablet Take 20 mg by mouth daily.   Yes Historical Provider, MD  Multiple Vitamin (MULTIVITAMIN WITH MINERALS) TABS Take 1 tablet by mouth daily.   Yes Historical Provider, MD  nitroGLYCERIN (NITROSTAT) 0.4 MG SL tablet Place 1 tablet (0.4 mg total) under the tongue every 5 (five) minutes as needed. 06/03/12  Yes [name], MD  ondansetron (ZOFRAN ODT) 4 MG disintegrating tablet 4mg  ODT q4 hours prn nausea/vomit 09/08/12  Yes Richardean Canal, MD  oxyCODONE-acetaminophen (PERCOCET) 5-325 MG per tablet Take 2 tablets by mouth  every 4 (four) hours as needed for pain. 09/08/12  Yes Richardean Canal, MD  potassium chloride SA (K-DUR,KLOR-CON) 20 MEQ tablet Take 1 tablet (20 mEq total) by mouth daily. 06/01/12  Yes [name], MD  saccharomyces boulardii (FLORASTOR) 250 MG capsule Take 250 mg by mouth 2 (two) times daily.   Yes Historical Provider, MD  traMADol (ULTRAM) 50 MG tablet Take 50 mg by mouth every 6 (six) hours as needed. For pain.   Yes Historical Provider, MD  VALERIAN ROOT PO Take 1 capsule by mouth daily.    Yes Historical Provider, MD  Vancomycin HCl (VANCOCIN HCL PO) Take 250 mg by mouth 4 (four) times daily.    Yes Historical Provider, MD  vitamin C (ASCORBIC ACID) 500 MG tablet Take 500 mg  by mouth daily.   Yes Historical Provider, MD  warfarin (COUMADIN) 5 MG tablet Take 5-7.5 mg by mouth daily. 1 tab daily except 1.5 tabs daily on Mondays, Wednesdays and Fridays.   Yes Historical Provider, MD   Physical Exam: Filed Vitals:   09/10/12 1529 09/10/12 1641 09/10/12 1837  BP: 117/79 127/76 138/85  Pulse: 74 70 73  Temp: 98.6 F (37 C) 98.4 F (36.9 C) 99.1 F (37.3 C)  TempSrc: Oral Oral Oral  Resp: 20    SpO2: 99%       General: No acute distress, afebrile, particular examination and nontoxic in appearance.   Eyes: No icterus, PERRLA, extraocular muscles intact, no nystagmus  ENT: Dry mucous membranes, no erythema or exudate , no discharges out of his ears or nostrils  Neck: No JVD, no bruits or thyromegaly  Cardiovascular: irregular rhythm, no rubs or gallops  Respiratory: Clear to auscultation bilaterally   Abdomen: soft, nondistended, tenderness to palpation appreciated on the right upper quadrant and mid epigastric region, mild guarding of right upper quadrant and worsening of pain with deep palpation. Patient even endorses some difficulty breathing with that deep palpation (questionable murphy sings On physical exam)   Skin: No erythema, no rashes, no petechiae  Musculoskeletal:  Full range of motion, no joint swelling or erythema.  Psychiatric: Appropriate, no hallucinations.  Neurologic: cranial nerve intact, patient alert, awake and oriented x3; muscle strength 4/5 bilaterally and symmetrically secondary to poor effort, no focal sensory deficit appreciated.   Labs on Admission:  Basic Metabolic Panel:  Lab 09/10/12 1610 09/08/12 1516  NA 131* 123*  K 4.2 5.0  CL 95* 89*  CO2 26 22  GLUCOSE 109* 98  BUN 20 19  CREATININE 1.55* 1.16  CALCIUM 8.8 8.9  MG -- --  PHOS -- --   Liver Function Tests:  Lab 09/10/12 1616 09/08/12 1516  AST 87* 150*  ALT 126* 140*  ALKPHOS 96 97  BILITOT 1.7* 3.1*  PROT 7.2 7.8  ALBUMIN 3.4* 3.5    Lab 09/10/12 1616 09/08/12 1516  LIPASE 22 18  AMYLASE -- --   CBC:  Lab 09/10/12 1616 09/08/12 1516  WBC 4.8 5.8  NEUTROABS 3.5 4.0  HGB 11.0* 12.1*  HCT 31.5* 34.9*  MCV 94.3 94.6  PLT 211 225   Radiological Exams on Admission: US Abdomen Complete  09/08/2012  *RADIOLOGY REPORT*  Clinical Data:  Right upper quadrant abdominal pain.  Evaluate for acute cholecystitis.  COMPLETE ABDOMINAL ULTRASOUND  Comparison:  CT of the abdomen and pelvis 09/02/2012.  Findings:  Gallbladder:  No shadowing gallstones or echogenic sludge.  No gallbladder wall thickening or pericholecystic fluid.  Negative sonographic Murphy's sign according to the ultrasound technologist.  Common bile duct:  Normal caliber measuring 6.4 mm in the porta hepatis.  Liver:  Normal size and echotexture without focal parenchymal abnormality.  Patent portal vein with hepatopetal flow.  IVC:  Patent throughout its visualized course in the abdomen.  Pancreas:  The head of the pancreas is unremarkable in appearance. The body and tail could not be visualized secondary to overlying bowel gas.  Spleen:  Normal size and echotexture without focal parenchymal abnormality.3.9 cm in length. The  Right Kidney:  No hydronephrosis.  Well-preserved cortex.  Normal size and  parenchymal echotexture. 10.1 cm in length.  In the interpolar region and there is a 4.4 x 4.3 x 3.4 cm anechoic lesion with associated increased through transmission, compatible with a simple cyst.  An additional  simple cyst is also noted in the lower pole measuring 2.9 x 2.7 x 3.4 cm.  Left Kidney:  No hydronephrosis.  Well-preserved cortex.  Normal size and parenchymal echotexture. 10.7 cm in length.  Simple cyst in the lower pole measuring 4.8 x 2.8 x 3.1 cm.  Abdominal aorta:  2.5 mm in diameter proximally and tapers appropriately distally.  IMPRESSION: 1.  No evidence of cholelithiasis or findings to suggest acute cholecystitis. 2.  Multiple simple cysts in the kidneys bilaterally, as above.   Original Report Authenticated By: Florencia Reasons, M.D.     Assessment/Plan 1-Abdominal pain, acute, right upper quadrant: most likely subacute in nature, has been going on for the last 3 months. Concerns for gallbladder abnormalities or dyspepsia as reason for his symptoms.  -will admit to med surg bed for supportive care and further workup -use pain meds and IVF's -start pepcid -will order hida scan if abnormal will call CCS -if no abnormalities seen will then consult GI for EGD  2-FIBRILLATION, ATRIAL: rate controlled. -Will continue beta blocker -continue anticoagulation  3-SYSTOLIC HEART FAILURE, CHRONIC: EF 28%; s/p AICD placement -Compensated, no CP or SOB -Gentle hydration, close I' and O's and daily weight. -patient to follow a low sodium diet  4-Encounter for long-term (current) use of anticoagulants: due to A. Fib -will start patient lovenox; in anticipation for any procedures. -consult place to pharmacy to monitor and adjust anticoagulants  5-Acute on chronic renal insufficiency: hx of stage I renal insufficiency by GFR; worse now most likely secondary to dehydration, continue use of lisinopril and increase dose of lasix for the last 2 days. -will hold diuretics and lisinopril for  now -gentle hydration -Will follow renal function  6-Dehydration with hyponatremia: due to decrease PO intake 2/2 to abdominal pain -will provide gentle hydration  7-Transaminitis: most likely associated with abnormal gallbladder function (acalculous cholecystitis). Will follow LFT's trend and explore cause with hida-scan. Recent Liver CT and upper quadrant US demonstrating just mild fatty inflitrate.  DVT: lovenox per pharmacy  Code Status: Full Family Communication: wife at bedside Disposition Plan: admit to med-surg bed, further evaluation of his subacute abdominal pain and to fix electrolyte abnormalities and ARI. If further abnormalities seen on hida scan patient might even require CCS consultation and gallbladder surgery. LOs to be detrmine by clinical evolution; but most likely at least 3-4 days  Time spent: >30 minutes  Jackston Oaxaca [facility] Pager [phone]  If 7PM-7AM, please contact night-coverage [email or web address] Password [facility] 09/10/2012, 6:52 PM

## 2012-09-10 NOTE — ED Notes (Signed)
Patient given a urinal.

## 2012-09-10 NOTE — Telephone Encounter (Signed)
At this point, I feel the patient needs to be hospitalized. I've spoken to the on-call hospitalist who agrees with the plan for admission to their service, but recommends he first be evaluated in the ER for stabilization if necessary Given his recent diarrhea, and now epigastric abdominal pain with hyponatremia and elevated LFTs, along with his history of congestive heart failure, admission is the best course of action Further workup can be performed while hospitalized and GI can consult If his current symptoms are not heart failure exacerbation, then HIDA scan should be considered to rule out a acalculus cholecystitis, especially in light of recent CT imaging as documented by Mike Gip that his gallbladder wall was thickened

## 2012-09-10 NOTE — ED Notes (Signed)
Pt presenting to ed with c/o sent by his pcp for admission. Pt states he was here last night for the same symptoms. Pt states abdominal pain with positive nausea no vomiting pt states hematuria. Pt states onset of symptoms x 3 months with worsening pain.

## 2012-09-10 NOTE — Telephone Encounter (Signed)
Pt seen by Amy on 09/07/12 for possible recurrent CDIFF; CDIFF by PCR was negative. Wife called me on 09/08/12 to report pt was worse and I instructed her to have him go to the ER which she did. ER did several labs, gave him fluid which caused LLE for which he called his Cardiologist about, U/S of Abdomen that was - for acute gall bladder problems, but showed renal cysts bil. Pt sent home but his AST/ALT were high >140 each. Today he is still c/o pain mid sternal an 8--9 on 0-10 scale. He had 2-3 BMs this am that were more solid. Please advise. Thanks.

## 2012-09-10 NOTE — ED Notes (Signed)
Changed hourly NS rate from 55ml/hr to 31ml/hr see orders.

## 2012-09-10 NOTE — Telephone Encounter (Signed)
Informed pt's wife that pt needs to go back to [facility] ER. Pt should not have been sent home w/o rechecking critical labs on 09/08/12. Per Dr Lauro Franklin note below, he needs to be evaluated in the ER, but he will most likely be admitted. Wife stated understanding. Spoke with Gladys Damme, charge nurse at [facility] ER to inform her NOT TO SEND THE PT HOME;  She stated understanding.

## 2012-09-10 NOTE — Progress Notes (Signed)
ANTICOAGULATION CONSULT NOTE - Initial Consult  Pharmacy Consult for Lovenox Indication: atrial fibrillation  Allergies  Allergen Reactions  . Ace Inhibitors   . Amlodipine Besylate   . Codeine   . Corzide (Nadolol-Bendroflumethiazide)   . Diovan (Valsartan)   . Flagyl (Metronidazole)   . Latex   . Norvasc (Amlodipine Besylate)   . Omeprazole   . Penicillins     Patient Measurements:   Heparin Dosing Weight: 79.4kg (as of 09/07/2012)  Vital Signs: Temp: 99.1 F (37.3 C) (10/25 1837) Temp src: Oral (10/25 1837) BP: 138/85 mmHg (10/25 1837) Pulse Rate: 73  (10/25 1837)  Labs:  Basename 09/10/12 1616 09/08/12 1516  HGB 11.0* 12.1*  HCT 31.5* 34.9*  PLT 211 225  APTT -- --  LABPROT -- 24.4*  INR -- 2.32*  HEPARINUNFRC -- --  CREATININE 1.55* 1.16  CKTOTAL -- --  CKMB -- --  TROPONINI -- --    The CrCl is unknown because both a height and weight (above a minimum accepted value) are required for this calculation.   Medical History: Past Medical History  Diagnosis Date  . Ischemic cardiomyopathy     Myoview 02/2011  EF 28%  Coronary artery disease  (total occlusion of the right coronary artery, left-to-right )  . Atrial flutter     s/p TEE guided cardioversion  . Nonsustained ventricular tachycardia   . LBBB (left bundle branch block)   . Chronic drug-induced interstitial lung disorders   . Acute on chronic renal insufficiency   . Coronary artery disease     total occlusion of the right coronary artery, left to right collaterals.  He had Cypher stenting to the circumflex in  July 2003  . Seizure disorder   . AICD (automatic cardioverter/defibrillator) present     Medtronic CRT  . Hyponatremia   . 6949-lead     replaced 02/2011  . Hyperlipemia   . Hypertension   . Fatty liver     CT  . Family hx of colon cancer     2 SISTERS    Medications:  Coumadin PTA 5mg  qday except 7.5mg  MWF  Assessment: 62 yoM on coumadin PTA for Afib admitted with acute  abdominal pain and acute on chronic renal failure.  Pt to start LMWH once INR <2 for anticipated EGD procedures.     ARF: SCr 1.16-->1.55, CrCl ~47 (N)  Plts ok, hgb trending down (12.1-->11.0)  INR 2.32 (10/23)  Goal of Therapy:  Anti-Xa level 0.6-1.2 units/ml 4hrs after LMWH dose given Monitor platelets by anticoagulation protocol: Yes   Plan:  1.  PT/INR labs in the morning 2.  Follow up renal fxn 3.  Wait to dose LMWH until INR <2 and as appropriate with renal fxn  Smriti Barkow E 09/10/2012,8:59 PM

## 2012-09-10 NOTE — ED Provider Notes (Signed)
Medical screening examination/treatment/procedure(s) were performed by non-physician practitioner and as supervising physician I was immediately available for consultation/collaboration.  Toy Baker, MD 09/10/12 [phone]

## 2012-09-10 NOTE — ED Provider Notes (Signed)
History     CSN: [id number]  Arrival date & time 09/10/12  1458   First MD Initiated Contact with Patient 09/10/12 1535      Chief Complaint  Patient presents with  . Abdominal Pain  . Nausea  . Hematuria    (Consider location/radiation/quality/duration/timing/severity/associated sxs/prior treatment) HPI Comments: Patient reports abdominal pain x 3 months, gradually worsening.  Pt was seen in ED two days ago, found to be hyponatremic, dehydrated, with elevated LFTs and hematuria.  Pt d/c home with pain and nausea medication, followed up with PCP who has sent pt back to ED for admission.  Has spoken with [facility] regarding admission.  Pt reports pain is in his upper abdomen, epigastric and RUQ areas, is constant, 8-9/10, prevents him from sleeping at night, described only as "strong."  He gets some relief from percocet.  Denies fevers, though wife notes his temperature was 94.5 prior to coming to ED on 10/23.  Early in the course patient had diarrhea, which is improving.  Has been treated for c.diff, though tests have been negative x 2.      Patient is a 66 y.o. male presenting with abdominal pain and hematuria. The history is provided by the patient, the spouse and medical records.  Abdominal Pain The primary symptoms of the illness include abdominal pain. The primary symptoms of the illness do not include fever, shortness of breath, nausea, vomiting, diarrhea or dysuria.  Additional symptoms associated with the illness include chills and hematuria. Symptoms associated with the illness do not include constipation, urgency or frequency.  Hematuria Irritative symptoms do not include frequency or urgency. Associated symptoms include abdominal pain and chills. Pertinent negatives include no dysuria, fever, nausea or vomiting.    Past Medical History  Diagnosis Date  . Ischemic cardiomyopathy     Myoview 02/2011  EF 28%  Coronary artery disease  (total occlusion of the right  coronary artery, left-to-right )  . Atrial flutter     s/p TEE guided cardioversion  . Nonsustained ventricular tachycardia   . LBBB (left bundle branch block)   . Chronic drug-induced interstitial lung disorders   . Acute on chronic renal insufficiency   . Coronary artery disease     total occlusion of the right coronary artery, left to right collaterals.  He had Cypher stenting to the circumflex in  July 2003  . Seizure disorder   . AICD (automatic cardioverter/defibrillator) present     Medtronic CRT  . Hyponatremia   . 6949-lead     replaced 02/2011  . Hyperlipemia   . Hypertension   . Fatty liver     CT  . Family hx of colon cancer     2 SISTERS    Past Surgical History  Procedure Date  . Insert / replace / remove pacemaker     AICD medtronic  . Hernia repair     Family History  Problem Relation Age of Onset  . Heart disease Mother   . Heart disease Father   . Colon cancer Sister     BOTH SISTERS 1 DESEASED FROM COLON CANCER  . Colon cancer Sister     History  Substance Use Topics  . Smoking status: Never Smoker   . Smokeless tobacco: Never Used  . Alcohol Use: 0.0 oz/week     OCCASIONALLY NOT OFTEN      Review of Systems  Constitutional: Positive for chills. Negative for fever.  Respiratory: Negative for shortness of breath.   Cardiovascular: Negative  for chest pain.  Gastrointestinal: Positive for abdominal pain. Negative for nausea, vomiting, diarrhea, constipation and blood in stool.  Genitourinary: Positive for hematuria. Negative for dysuria, urgency and frequency.  Musculoskeletal: Negative for myalgias.  All other systems reviewed and are negative.    Allergies  Ace inhibitors; Amlodipine besylate; Codeine; Corzide; Diovan; Flagyl; Latex; Norvasc; Omeprazole; and Penicillins  Home Medications   Current Outpatient Rx  Name Route Sig Dispense Refill  . CALCIUM-MAGNESIUM 500-250 MG PO TABS Oral Take 1 tablet by mouth daily.     Marland Kitchen CARVEDILOL  PHOSPHATE ER 80 MG PO CP24 Oral Take 1 capsule (80 mg total) by mouth daily. 30 capsule 9  . COENZYME Q10 30 MG PO CAPS Oral Take 30 mg by mouth 3 (three) times daily.      . COLCHICINE 0.6 MG PO TABS Oral Take 0.6 mg by mouth daily as needed. For gout flareups.    Marland Kitchen PAPAYA ENZYME PO Oral Take 1 capsule by mouth 3 (three) times daily.     Marland Kitchen DIGOXIN 0.125 MG PO TABS Oral Take 0.125 mg by mouth every other day.    Marland Kitchen DIPHENHYDRAMINE HCL 25 MG PO CAPS Oral Take 25 mg by mouth every 6 (six) hours as needed. For allergies.    . OMEGA-3 FATTY ACIDS 1000 MG PO CAPS Oral Take 2 g by mouth daily.    . FUROSEMIDE 40 MG PO TABS Oral Take 40 mg by mouth daily. For swelling.    Marland Kitchen GREEN TEA PO Oral Take 1 capsule by mouth daily.     Marland Kitchen LISINOPRIL 20 MG PO TABS Oral Take 20 mg by mouth daily.    . ADULT MULTIVITAMIN W/MINERALS CH Oral Take 1 tablet by mouth daily.    Marland Kitchen NITROGLYCERIN 0.4 MG SL SUBL Sublingual Place 1 tablet (0.4 mg total) under the tongue every 5 (five) minutes as needed. 25 tablet 3  . ONDANSETRON 4 MG PO TBDP  4mg  ODT q4 hours prn nausea/vomit 10 tablet 0  . OXYCODONE-ACETAMINOPHEN 5-325 MG PO TABS Oral Take 2 tablets by mouth every 4 (four) hours as needed for pain. 15 tablet 0  . POTASSIUM CHLORIDE CRYS ER 20 MEQ PO TBCR Oral Take 1 tablet (20 mEq total) by mouth daily. 30 tablet 6  . SACCHAROMYCES BOULARDII 250 MG PO CAPS Oral Take 250 mg by mouth 2 (two) times daily.    . TRAMADOL HCL 50 MG PO TABS Oral Take 50 mg by mouth every 6 (six) hours as needed. For pain.    Marland Kitchen VALERIAN ROOT PO Oral Take 1 capsule by mouth daily.     Marland Kitchen VANCOCIN HCL PO Oral Take 250 mg by mouth 4 (four) times daily.     Marland Kitchen VITAMIN C 500 MG PO TABS Oral Take 500 mg by mouth daily.    . WARFARIN SODIUM 5 MG PO TABS Oral Take 5-7.5 mg by mouth daily. 1 tab daily except 1.5 tabs daily on Mondays, Wednesdays and Fridays.      BP 117/79  Pulse 74  Temp 98.6 F (37 C) (Oral)  Resp 20  SpO2 99%  Physical Exam    Nursing note and vitals reviewed. Constitutional: He appears well-developed and well-nourished. No distress.  HENT:  Head: Normocephalic and atraumatic.  Neck: Neck supple.  Cardiovascular: Normal rate and regular rhythm.   Pulmonary/Chest: Effort normal and breath sounds normal. No respiratory distress. He has no wheezes. He has no rales.  Abdominal: Soft. He exhibits no distension and no  mass. There is tenderness in the right upper quadrant, epigastric area and suprapubic area. There is no rebound and no guarding.  Neurological: He is alert. He exhibits normal muscle tone.  Skin: He is not diaphoretic.    ED Course  Procedures (including critical care time)  Labs Reviewed  CBC WITH DIFFERENTIAL - Abnormal; Notable for the following:    RBC 3.34 (*)     Hemoglobin 11.0 (*)     HCT 31.5 (*)     All other components within normal limits  COMPREHENSIVE METABOLIC PANEL - Abnormal; Notable for the following:    Sodium 131 (*)     Chloride 95 (*)     Glucose, Bld 109 (*)     Creatinine, Ser 1.55 (*)     Albumin 3.4 (*)     AST 87 (*)     ALT 126 (*)     Total Bilirubin 1.7 (*)     GFR calc non Af Amer 45 (*)     GFR calc Af Amer 52 (*)     All other components within normal limits  LIPASE, BLOOD  URINALYSIS, MICROSCOPIC ONLY  URINALYSIS, ROUTINE W REFLEX MICROSCOPIC   US Abdomen Complete  09/08/2012  *RADIOLOGY REPORT*  Clinical Data:  Right upper quadrant abdominal pain.  Evaluate for acute cholecystitis.  COMPLETE ABDOMINAL ULTRASOUND  Comparison:  CT of the abdomen and pelvis 09/02/2012.  Findings:  Gallbladder:  No shadowing gallstones or echogenic sludge.  No gallbladder wall thickening or pericholecystic fluid.  Negative sonographic Murphy's sign according to the ultrasound technologist.  Common bile duct:  Normal caliber measuring 6.4 mm in the porta hepatis.  Liver:  Normal size and echotexture without focal parenchymal abnormality.  Patent portal vein with hepatopetal  flow.  IVC:  Patent throughout its visualized course in the abdomen.  Pancreas:  The head of the pancreas is unremarkable in appearance. The body and tail could not be visualized secondary to overlying bowel gas.  Spleen:  Normal size and echotexture without focal parenchymal abnormality.3.9 cm in length. The  Right Kidney:  No hydronephrosis.  Well-preserved cortex.  Normal size and parenchymal echotexture. 10.1 cm in length.  In the interpolar region and there is a 4.4 x 4.3 x 3.4 cm anechoic lesion with associated increased through transmission, compatible with a simple cyst.  An additional simple cyst is also noted in the lower pole measuring 2.9 x 2.7 x 3.4 cm.  Left Kidney:  No hydronephrosis.  Well-preserved cortex.  Normal size and parenchymal echotexture. 10.7 cm in length.  Simple cyst in the lower pole measuring 4.8 x 2.8 x 3.1 cm.  Abdominal aorta:  2.5 mm in diameter proximally and tapers appropriately distally.  IMPRESSION: 1.  No evidence of cholelithiasis or findings to suggest acute cholecystitis. 2.  Multiple simple cysts in the kidneys bilaterally, as above.   Original Report Authenticated By: Florencia Reasons, M.D.    Dg Abd Acute W/chest  09/08/2012  *RADIOLOGY REPORT*  Clinical Data: Abdominal pain.  Nausea and vomiting. Diarrhea.  ACUTE ABDOMEN SERIES (ABDOMEN 2 VIEW & CHEST 1 VIEW)  Comparison: Multiple exams, including 09/01/2012 and 09/02/2012  Findings: Cardiomegaly noted.  AICD in place.  Blunting of both costophrenic angles compatible with small pleural effusions.  Mild linear airspace opacity in the left lower lobe.  Thoracic spondylosis noted.  No free intraperitoneal gas identified.  Gas and stool noted in the proximal colon.  Gas-filled loops of nondilated small bowel noted in the left  abdomen. Tiny air fluid levels in the descending colon, in combination with the possible wall thickening in the descending and sigmoid colon on recent CT scan, may favor colitis.  IMPRESSION:  1.   Tiny locules of gas in the descending colon which otherwise appears small in caliber.  I suspect distal colitis. 2.  Small bilateral pleural effusions, with mild atelectasis in the left lower lobe. 3.  Cardiomegaly.   Original Report Authenticated By: Dellia Cloud, M.D.    4:39 PM Dr Freida Busman made aware of the patient.   5:54 PM Admitted to [facility], who has asked that I place bed request but not holding orders.  He will see patient in ED.    1. Abdominal pain   2. Dehydration   3. Elevated LFTs     MDM  Pt with 3 months of worsening abdominal pain, recent visit to ED, found to have elevated LFTs, hyponatremia, dehydration, hematuria (pt is on coumadin).  On last visit, concern for colitis by xray.   Pt presents again today at the request of his PCP who has spoken with [facility] hospitalist for admission.  Pt is afebrile and nontoxic, but uncomfortable appearing.  Pt has had some increased edema given CHF and 2L IVF given in ED for dehydration, also worsening renal function, possibly from increased lasix dosage.  Labs are overall improved from two days ago but continues to have same problems and worsening symptoms.  Pt admitted to [facility] hospitalist.  May need HIDA scan as inpatient,  Concern for colitis by xray, may need abx.  Cdiff is negative by PCR 10/22, pt taken off precautions.         [place], [place] 09/10/12 Windell Moment

## 2012-09-11 ENCOUNTER — Inpatient Hospital Stay (HOSPITAL_COMMUNITY): Payer: Medicare Other

## 2012-09-11 LAB — COMPREHENSIVE METABOLIC PANEL
BUN: 17 mg/dL (ref 6–23)
Calcium: 8.8 mg/dL (ref 8.4–10.5)
GFR calc Af Amer: 59 mL/min — ABNORMAL LOW (ref >90)
GFR calc non Af Amer: 51 mL/min — ABNORMAL LOW (ref >90)
Glucose, Bld: 101 mg/dL — ABNORMAL HIGH (ref 70–99)
Total Protein: 7.1 g/dL (ref 6.0–8.3)

## 2012-09-11 LAB — PROTIME-INR
INR: 2.14 — ABNORMAL HIGH (ref 0.00–1.49)
Prothrombin Time: 23 seconds — ABNORMAL HIGH (ref 11.6–15.2)

## 2012-09-11 LAB — CBC
HCT: 32.7 % — ABNORMAL LOW (ref 39.0–52.0)
Hemoglobin: 11.3 g/dL — ABNORMAL LOW (ref 13.0–17.0)
RDW: 14.7 % (ref 11.5–15.5)
WBC: 5 10*3/uL (ref 4.0–10.5)

## 2012-09-11 MED ORDER — MORPHINE SULFATE 2 MG/ML IJ SOLN
1.0000 mg | INTRAMUSCULAR | Status: DC | PRN
  Administered 2012-09-11 – 2012-09-13 (×6): 1 mg via INTRAVENOUS
  Filled 2012-09-11 (×6): qty 1

## 2012-09-11 MED ORDER — TECHNETIUM TC 99M MEBROFENIN IV KIT
5.5000 | PACK | Freq: Once | INTRAVENOUS | Status: AC | PRN
  Administered 2012-09-11: 6 via INTRAVENOUS

## 2012-09-11 MED ORDER — OXYCODONE HCL 5 MG PO TABS
5.0000 mg | ORAL_TABLET | Freq: Four times a day (QID) | ORAL | Status: DC | PRN
  Administered 2012-09-11 – 2012-09-14 (×7): 5 mg via ORAL
  Filled 2012-09-11 (×8): qty 1

## 2012-09-11 NOTE — Progress Notes (Signed)
ANTICOAGULATION CONSULT NOTE - Follow Up  Pharmacy Consult for Lovenox Indication: atrial fibrillation  Patient Measurements: Height: 5' 10.5" (179.1 cm) Weight: 172 lb 3.2 oz (78.109 kg) IBW/kg (Calculated) : 74.15  Heparin Dosing Weight: 79.4kg (as of 09/07/2012)  Labs:  Basename 09/11/12 0425 09/10/12 1616 09/08/12 1516  HGB 11.3* 11.0* --  HCT 32.7* 31.5* 34.9*  PLT 228 211 225  APTT -- -- --  LABPROT 23.0* -- 24.4*  INR 2.14* -- 2.32*  HEPARINUNFRC -- -- --  CREATININE 1.39* 1.55* 1.16  CKTOTAL -- -- --  CKMB -- -- --  TROPONINI -- -- --    Medications:  Coumadin PTA 5mg  qday except 7.5mg  MWF  Assessment: 65 yoM on coumadin PTA for Afib admitted with acute abdominal pain and acute on chronic renal failure.  Pt to start LMWH once INR <2 for anticipated EGD procedures.     ARF: SCr improved since yesterday, 1.39 < 1.55, CrCl~54 ml/min  Plts/Hgb stable  INR 2.14, pt reports taking 7.5 mg dose 10/25  Goal of Therapy:  Anti-Xa level 0.6-1.2 units/ml 4hrs after LMWH dose given Monitor platelets by anticoagulation protocol: Yes   Plan:  1.  PT/INR labs in the morning 2.  Follow up renal fxn 3.  Wait to dose LMWH until INR <2 and as appropriate with renal fxn  Janki Dike 09/11/2012,8:52 AM

## 2012-09-11 NOTE — Progress Notes (Signed)
[facility] PROGRESS NOTE  HAIVEN BARTRAM WUJ:[id number] DOB: 12-19-1945 DOA: 09/10/2012 PCP: Crist Fat, MD  Assessment/Plan: 1-Abdominal pain, acute, right upper quadrant: most likely subacute in nature, has been going on for the last 3 months. Concerns for gallbladder abnormalities or dyspepsia as reason for his symptoms.  -Continue using pain meds and IVF's  -continue pepcid  -will follow results of hida scan (done today) and depending results will call [facility]  -if no abnormalities seen will then consult GI for EGD as an inpatient.  2-FIBRILLATION, ATRIAL: rate controlled.  -Will continue beta blocker  -continue anticoagulation   3-SYSTOLIC HEART FAILURE, CHRONIC: EF 28%; s/p AICD placement  -Compensated, no CP or SOB  -Gentle hydration, close I' and O's and daily weight.  -patient to follow a low sodium diet   4-Encounter for long-term (current) use of anticoagulants: due to A. Fib  -will start patient lovenox; in anticipation for any procedures.  -consult place to pharmacy to monitor and adjust anticoagulants  -INR 2.14 today  5-Acute on chronic renal insufficiency: hx of stage I renal insufficiency by GFR; worse now most likely secondary to dehydration, continue use of lisinopril and increase dose of lasix for the last 2 days.  -will continue holding diuretics and lisinopril for now  -Continue gentle hydration  -renal function trending down. -BMET in am  6-Dehydration with hyponatremia: Improved. Hyponatremia resolved. Continue clear liquid diet for now.  -will continue gentle hydration   7-Transaminitis: most likely associated with abnormal gallbladder function (acalculous cholecystitis). LFT's trending down. Will follow hida-scan results. Recent Liver CT and upper quadrant US demonstrating just mild fatty inflitrate.   DVT: lovenox per pharmacy once INR below 2   Code Status: Full code Family Communication: Wife at bedside Disposition Plan: Home when  medically stable.   Consultants:  none  Procedures:  Hida scan (pending)  Antibiotics:  None  HPI/Subjective: Afebrile, no nausea, no vomiting; patient denies any chest pain or shortness of breath. He still complaining of mild discomfort on his right upper quadrant.  Objective: Filed Vitals:   09/10/12 1641 09/10/12 1837 09/10/12 2109 09/11/12 0443  BP: 127/76 138/85 129/85 125/80  Pulse: 70 73 74 70  Temp: 98.4 F (36.9 C) 99.1 F (37.3 C) 98.1 F (36.7 C) 98 F (36.7 C)  TempSrc: Oral Oral Oral Oral  Resp:   18 18  Height:   5' 10.5" (1.791 m)   Weight:   78.109 kg (172 lb 3.2 oz)   SpO2:   99% 97%    Intake/Output Summary (Last 24 hours) at 09/11/12 1542 Last data filed at 09/11/12 1300  Gross per 24 hour  Intake    435 ml  Output    375 ml  Net     60 ml   Filed Weights   09/10/12 2109  Weight: 78.109 kg (172 lb 3.2 oz)    Exam:   General:  Afebrile, no nausea or vomiting; still with abdominal pain (RUQ)  Cardiovascular: irregular, no rubs or gallops  Respiratory: CTA  Abdomen: soft, mild discomfort on palpation on his RUQ; positive BS  Extremities: trace edema bilaterally  Neuro: non focal  Data Reviewed: Basic Metabolic Panel:  Lab 09/11/12 9562 09/10/12 1616 09/08/12 1516  NA 131* 131* 123*  K 4.2 4.2 5.0  CL 97 95* 89*  CO2 24 26 22   GLUCOSE 101* 109* 98  BUN 17 20 19   CREATININE 1.39* 1.55* 1.16  CALCIUM 8.8 8.8 8.9  MG -- -- --  PHOS -- -- --   Liver Function Tests:  Lab 09/11/12 0425 09/10/12 1616 09/08/12 1516  AST 77* 87* 150*  ALT 120* 126* 140*  ALKPHOS 94 96 97  BILITOT 1.8* 1.7* 3.1*  PROT 7.1 7.2 7.8  ALBUMIN 3.2* 3.4* 3.5    Lab 09/10/12 1616 09/08/12 1516  LIPASE 22 18  AMYLASE -- --   CBC:  Lab 09/11/12 0425 09/10/12 1616 09/08/12 1516  WBC 5.0 4.8 5.8  NEUTROABS -- 3.5 4.0  HGB 11.3* 11.0* 12.1*  HCT 32.7* 31.5* 34.9*  MCV 95.1 94.3 94.6  PLT 228 211 225    Recent Results (from the past 240  hour(s))  CLOSTRIDIUM DIFFICILE BY PCR     Status: Normal   Collection Time   09/07/12  4:04 PM      Component Value Range Status Comment   C difficile by pcr Not Detected  Not Detected Final      Studies: No results found.  Scheduled Meds:   . sodium chloride  1,000 mL Intravenous Once  . sodium chloride   Intravenous Once  . antiseptic oral rinse  15 mL Mouth Rinse q12n4p  . carvedilol  80 mg Oral Daily  . chlorhexidine  15 mL Mouth Rinse BID  . digoxin  0.125 mg Oral QODAY  . famotidine  40 mg Oral BID  .  morphine injection  4 mg Intravenous Once  . multivitamin with minerals  1 tablet Oral Daily  . ondansetron (ZOFRAN) IV  4 mg Intravenous Once   Continuous Infusions:   . sodium chloride 50 mL/hr at 09/11/12 0231    Active Problems:  FIBRILLATION, ATRIAL  SYSTOLIC HEART FAILURE, CHRONIC  Encounter for long-term (current) use of anticoagulants  Acute on chronic renal insufficiency  Dehydration with hyponatremia  Transaminitis  Gallbladder anomaly  Abdominal pain, acute, right upper quadrant    Time spent: >30 minutes    Yaniah Thiemann  [facility] Pager [phone]. If 8PM-8AM, please contact night-coverage at [email or web address], password [facility] 09/11/2012, 3:42 PM  LOS: 1 day

## 2012-09-11 NOTE — Progress Notes (Signed)
Initial review for inpatient status is complete. 

## 2012-09-12 LAB — BASIC METABOLIC PANEL
BUN: 16 mg/dL (ref 6–23)
Calcium: 8.4 mg/dL (ref 8.4–10.5)
GFR calc Af Amer: 72 mL/min — ABNORMAL LOW (ref >90)
GFR calc non Af Amer: 62 mL/min — ABNORMAL LOW (ref >90)
Potassium: 4.1 mEq/L (ref 3.5–5.1)
Sodium: 132 mEq/L — ABNORMAL LOW (ref 135–145)

## 2012-09-12 LAB — PROTIME-INR: Prothrombin Time: 22.7 seconds — ABNORMAL HIGH (ref 11.6–15.2)

## 2012-09-12 MED ORDER — PHYTONADIONE 1 MG/0.5 ML ORAL SOLUTION
1.0000 mg | Freq: Once | ORAL | Status: AC
  Administered 2012-09-12: 1 mg via ORAL
  Filled 2012-09-12: qty 0.5

## 2012-09-12 MED ORDER — PHENOL 1.4 % MT LIQD
2.0000 | OROMUCOSAL | Status: DC | PRN
  Filled 2012-09-12 (×2): qty 177

## 2012-09-12 MED ORDER — BOOST / RESOURCE BREEZE PO LIQD
1.0000 | Freq: Three times a day (TID) | ORAL | Status: DC
Start: 2012-09-12 — End: 2012-09-14
  Administered 2012-09-12 (×2): 1 via ORAL

## 2012-09-12 MED ORDER — PHYTONADIONE NICU INJECTION 1 MG/0.5 ML: 0.5000 mg | Freq: Once | INTRAMUSCULAR | Status: DC

## 2012-09-12 NOTE — Progress Notes (Signed)
INITIAL ADULT NUTRITION ASSESSMENT Date: 09/12/2012   Time: 10:39 AM Reason for Assessment: Malnutrition Screening Tool  ASSESSMENT: Male 66 y.o.  Dx: Abdominal pain; Acute on chronic renal insufficiency; dehydration with hyponatremia; Transaminitis  Past Medical History  Diagnosis Date  . Ischemic cardiomyopathy     Myoview 02/2011  EF 28%  Coronary artery disease  (total occlusion of the right coronary artery, left-to-right )  . Atrial flutter     s/p TEE guided cardioversion  . Nonsustained ventricular tachycardia   . LBBB (left bundle branch block)   . Chronic drug-induced interstitial lung disorders   . Acute on chronic renal insufficiency   . Coronary artery disease     total occlusion of the right coronary artery, left to right collaterals.  He had Cypher stenting to the circumflex in  July 2003  . Seizure disorder   . AICD (automatic cardioverter/defibrillator) present     Medtronic CRT  . Hyponatremia   . 6949-lead     replaced 02/2011  . Hyperlipemia   . Hypertension   . Fatty liver     CT  . Family hx of colon cancer     2 SISTERS    Scheduled Meds:   . sodium chloride   Intravenous Once  . antiseptic oral rinse  15 mL Mouth Rinse q12n4p  . carvedilol  80 mg Oral Daily  . chlorhexidine  15 mL Mouth Rinse BID  . digoxin  0.125 mg Oral QODAY  . famotidine  40 mg Oral BID  . multivitamin with minerals  1 tablet Oral Daily   Continuous Infusions:   . sodium chloride 50 mL/hr at 09/11/12 0231   PRN Meds:.acetaminophen, acetaminophen, morphine injection, ondansetron (ZOFRAN) IV, ondansetron, oxyCODONE, technetium TC 22M mebrofenin, DISCONTD:  morphine injection   Ht: 5' 10.5" (179.1 cm)  Wt: 172 lb (78.019 kg)  Ideal Wt: 76.8 kg  % Ideal Wt: 101%  Usual Wt:  Wt Readings from Last 10 Encounters:  09/12/12 172 lb (78.019 kg)  09/07/12 175 lb (79.379 kg)  07/13/12 179 lb 6.4 oz (81.375 kg)  05/26/12 184 lb (83.462 kg)  06/20/11 186 lb (84.369 kg)    04/23/11 186 lb (84.369 kg)  03/03/11 182 lb (82.555 kg)  02/24/11 135 lb (61.236 kg)  09/03/10 186 lb (84.369 kg)  03/12/10 187 lb (84.823 kg)  % Usual Wt: 93%; not significant % weight loss, although weight trending down  Body mass index is 24.33 kg/(m^2). Normal weight.  Food/Nutrition Related Hx:  Pt reports abdominal pain x 3 months PTA and decreased appetite.  Labs:  CMP     Component Value Date/Time   NA 132* 09/12/2012 0410   K 4.1 09/12/2012 0410   CL 100 09/12/2012 0410   CO2 23 09/12/2012 0410   GLUCOSE 88 09/12/2012 0410   BUN 16 09/12/2012 0410   CREATININE 1.18 09/12/2012 0410   CALCIUM 8.4 09/12/2012 0410   PROT 7.1 09/11/2012 0425   ALBUMIN 3.2* 09/11/2012 0425   AST 77* 09/11/2012 0425   ALT 120* 09/11/2012 0425   ALKPHOS 94 09/11/2012 0425   BILITOT 1.8* 09/11/2012 0425   GFRNONAA 62* 09/12/2012 0410   GFRAA 72* 09/12/2012 0410    Intake/Output Summary (Last 24 hours) at 09/12/12 1046 Last data filed at 09/12/12 0500  Gross per 24 hour  Intake    240 ml  Output    400 ml  Net   -160 ml    Diet Order: Clear Liquid (100% PO documented)  Supplements/Tube Feeding: None at this time.  IVF:    sodium chloride Last Rate: 50 mL/hr at 09/11/12 0231    Estimated Nutritional Needs:   Kcal: 2100-2300 Protein: 80-90 gm Fluid: >2.3 L  NUTRITION DIAGNOSIS: -Inadequate oral intake (NI-2.1).  Status: Ongoing  RELATED TO: abdominal pain, decreased appetite  AS EVIDENCE BY: pt report, downward trending weight  MONITORING/EVALUATION(Goals): Goal: 1.) To meet >/= 75% estimated nutrition needs via PO intake. 2.) Pt to tolerate diet advancement when medically appropriate. Monitor: PO intake, supplement acceptance, weight, labs, diet advancement  EDUCATION NEEDS: -No education needs identified at this time  INTERVENTION:  Encourage PO intake at meals.  Resource Breeze TID between meals (250 kcal, 9 gm protein per 8 oz).  RD to  follow.   DOCUMENTATION CODES Per approved criteria  -Not Applicable    Leonette Most 09/12/2012, 10:39 AM

## 2012-09-12 NOTE — Progress Notes (Signed)
ANTICOAGULATION CONSULT NOTE - Follow Up  Pharmacy Consult for Lovenox Indication: atrial fibrillation  Patient Measurements: Height: 5' 10.5" (179.1 cm) Weight: 172 lb 3.2 oz (78.109 kg) IBW/kg (Calculated) : 74.15  Heparin Dosing Weight: 79.4kg (as of 09/07/2012)  Labs:  Basename 09/12/12 0410 09/11/12 0425 09/10/12 1616  HGB -- 11.3* 11.0*  HCT -- 32.7* 31.5*  PLT -- 228 211  APTT -- -- --  LABPROT 22.7* 23.0* --  INR 2.10* 2.14* --  HEPARINUNFRC -- -- --  CREATININE 1.18 1.39* 1.55*  CKTOTAL -- -- --  CKMB -- -- --  TROPONINI -- -- --    Medications:  Coumadin PTA 5mg  qday except 7.5mg  MWF  Assessment: 65 yoM on coumadin PTA for Afib admitted with acute abdominal pain and acute on chronic renal failure.  Pt to start LMWH once INR <2 for anticipated EGD procedures.     ARF: SCr improving, 1.18 < 1.39 < 1.55, CrCl~63 ml/min  Plts/Hgb stable  INR down slightly to 2.10 this AM.  Last warfarin dose: pt reports taking 7.5 mg dose 10/25  Goal of Therapy:  Anti-Xa level 0.6-1.2 units/ml 4hrs after LMWH dose given Monitor platelets by anticoagulation protocol: Yes   Plan:  1.  PT/INR labs in the morning 2.  Follow up renal fxn 3.  Wait to dose LMWH until INR <2 and as appropriate with renal fxn  Sherian Valenza 09/12/2012,6:07 AM

## 2012-09-12 NOTE — Progress Notes (Signed)
[facility] PROGRESS NOTE  [name] AVW:[id number] DOB: January 10, 1946 DOA: 09/10/2012 PCP: Crist Fat, MD  Assessment/Plan: 1-Abdominal pain, acute, right upper quadrant: most likely subacute in nature, has been going on for the last 3 months. Dyspepsia (PUD vs H. Pylori infection) as reason for his symptoms.  -Continue using pain meds and supportive care  -continue pepcid BID -hida scan normal; discuss with surgery and at this moment rec's are for not gallbladder surgery  -GI for EGD as an inpatient has been consulted.  2-FIBRILLATION, ATRIAL: rate controlled.  -Will continue beta blocker  -continue anticoagulation (lovenox when INR less than 2)  3-SYSTOLIC HEART FAILURE, CHRONIC: EF 28%; s/p AICD placement  -Compensated, no CP or SOB  -Gentle hydration, close I' and O's and daily weight.  -patient to follow a low sodium diet   4-Encounter for long-term (current) use of anticoagulants: due to A. Fib  -will start patient lovenox when INR below 2; in anticipation for any procedures.  -consult place to pharmacy to monitor and adjust anticoagulants  -INR 2.10 today -Since he needs to be 1.7-1.8 for EGD; will give vit K 0.5mg  orally today and follow INR  5-Acute on chronic renal insufficiency: hx of stage I renal insufficiency by GFR; worse now most likely secondary to dehydration, continue use of lisinopril and increase dose of lasix for the last 2 days prior to admission.  -will continue holding diuretics and lisinopril for today  -renal function now back to WNL. -CMET in am  6-Dehydration with hyponatremia: Resolved. Continue clear liquid diet for now.  -will change IVF's to [facility]  7-Transaminitis: LFT's trending down. Hida-scan normal. Recent Liver CT and upper quadrant US demonstrating just mild fatty inflitrate. Will ask GI to come on board and performed EGD  DVT: lovenox per pharmacy once INR below 2   Code Status: Full code Family Communication: Wife by phone  [phone]) Disposition Plan: Home when medically stable.   Consultants:  GI  Procedures:  Hida scan (pending)  Antibiotics:  None  HPI/Subjective: Afebrile, no nausea, no vomiting; patient denies any chest pain or shortness of breath. He still complaining of abdominal  discomfort on his right upper quadrant/epigastric area. Complaining of sore throat this morning. Hida scan normal..  Objective: Filed Vitals:   09/11/12 2130 09/12/12 0628 09/12/12 0916 09/12/12 1445  BP: 114/80 129/92  119/74  Pulse: 68 69 70 67  Temp: 97.4 F (36.3 C) 97.7 F (36.5 C)  97.6 F (36.4 C)  TempSrc: Oral Oral  Oral  Resp: 18 16  16   Height:      Weight:  78.019 kg (172 lb)    SpO2: 98% 98%  96%    Intake/Output Summary (Last 24 hours) at 09/12/12 1522 Last data filed at 09/12/12 1500  Gross per 24 hour  Intake    340 ml  Output    500 ml  Net   -160 ml   Filed Weights   09/10/12 2109 09/12/12 0628  Weight: 78.109 kg (172 lb 3.2 oz) 78.019 kg (172 lb)    Exam:   General:  Afebrile, no nausea or vomiting; still with abdominal pain (RUQ/epigastric area)  Cardiovascular: irregular, no rubs or gallops  Respiratory: CTA  Abdomen: soft, still with discomfort on palpation on his RUQ/epigastric area; positive BS  Extremities: no edema bilaterally  Neuro: non focal  Data Reviewed: Basic Metabolic Panel:  Lab 09/12/12 3086 09/11/12 0425 09/10/12 1616 09/08/12 1516  NA 132* 131* 131* 123*  K 4.1  4.2 4.2 5.0  CL 100 97 95* 89*  CO2 23 24 26 22   GLUCOSE 88 101* 109* 98  BUN 16 17 20 19   CREATININE 1.18 1.39* 1.55* 1.16  CALCIUM 8.4 8.8 8.8 8.9  MG -- -- -- --  PHOS -- -- -- --   Liver Function Tests:  Lab 09/11/12 0425 09/10/12 1616 09/08/12 1516  AST 77* 87* 150*  ALT 120* 126* 140*  ALKPHOS 94 96 97  BILITOT 1.8* 1.7* 3.1*  PROT 7.1 7.2 7.8  ALBUMIN 3.2* 3.4* 3.5    Lab 09/10/12 1616 09/08/12 1516  LIPASE 22 18  AMYLASE -- --   CBC:  Lab 09/11/12 0425  09/10/12 1616 09/08/12 1516  WBC 5.0 4.8 5.8  NEUTROABS -- 3.5 4.0  HGB 11.3* 11.0* 12.1*  HCT 32.7* 31.5* 34.9*  MCV 95.1 94.3 94.6  PLT 228 211 225    Recent Results (from the past 240 hour(s))  CLOSTRIDIUM DIFFICILE BY PCR     Status: Normal   Collection Time   09/07/12  4:04 PM      Component Value Range Status Comment   C difficile by pcr Not Detected  Not Detected Final      Studies: Nm Hepato W/eject Fract  09/11/2012  *RADIOLOGY REPORT*  Clinical Data:  Right upper quadrant pain.  NUCLEAR MEDICINE HEPATOBILIARY IMAGING WITH GALLBLADDER EF  Technique:  Sequential images of the abdomen were obtained out to 60 minutes following intravenous administration of radiopharmaceutical. After oral ingestion of Ensure, gallbladder ejection fraction was determined.  Radiopharmaceutical:  5.5 mCi Tc-55m Choletec  Comparison: Ultrasound 09/08/2012  Findings: There is uniform uptake radiotracer within the liver. Counts are excreted into the bowel by 35 minutes.  The gallbladder is visualized by 30 minutes.  Upon administration of the fatty meal, the gallbladder contracts appropriately with a calculated ejection fraction equal 59%.  Gallbladder ejection fraction:  59%. Normal gallbladder ejection fraction with Ensure is greater than 33%.  IMPRESSION:  1.  Normal gallbladder ejection fraction equals 59%. 2.  Patent cystic duct and common bile duct.   Original Report Authenticated By: Genevive Bi, M.D.     Scheduled Meds:    . sodium chloride   Intravenous Once  . antiseptic oral rinse  15 mL Mouth Rinse q12n4p  . carvedilol  80 mg Oral Daily  . chlorhexidine  15 mL Mouth Rinse BID  . digoxin  0.125 mg Oral QODAY  . famotidine  40 mg Oral BID  . feeding supplement  1 Container Oral TID BM  . multivitamin with minerals  1 tablet Oral Daily  . phytonadione  1 mg Oral Once  . DISCONTD: phytonadione  0.5 mg Intramuscular Once   Continuous Infusions:   Time spent: >30  minutes    Efren Kross  [facility] Pager [phone]. If 8PM-8AM, please contact night-coverage at [email or web address], password [facility] 09/12/2012, 3:22 PM  LOS: 2 days

## 2012-09-13 ENCOUNTER — Encounter (HOSPITAL_COMMUNITY): Payer: Self-pay | Admitting: Gastroenterology

## 2012-09-13 ENCOUNTER — Encounter (HOSPITAL_COMMUNITY)
Admission: EM | Disposition: A | Discharge status: Home / Self Care | Payer: Self-pay | Source: Home / Self Care | Attending: Internal Medicine

## 2012-09-13 DIAGNOSIS — D649 Anemia, unspecified: Secondary | ICD-10-CM

## 2012-09-13 DIAGNOSIS — Z7901 Long term (current) use of anticoagulants: Secondary | ICD-10-CM

## 2012-09-13 DIAGNOSIS — I5022 Chronic systolic (congestive) heart failure: Secondary | ICD-10-CM

## 2012-09-13 DIAGNOSIS — R7989 Other specified abnormal findings of blood chemistry: Secondary | ICD-10-CM

## 2012-09-13 DIAGNOSIS — R109 Unspecified abdominal pain: Secondary | ICD-10-CM

## 2012-09-13 DIAGNOSIS — R634 Abnormal weight loss: Secondary | ICD-10-CM

## 2012-09-13 HISTORY — PX: ESOPHAGOGASTRODUODENOSCOPY: SHX5428

## 2012-09-13 LAB — COMPREHENSIVE METABOLIC PANEL
ALT: 82 U/L — ABNORMAL HIGH (ref 0–53)
CO2: 22 mEq/L (ref 19–32)
Calcium: 8.4 mg/dL (ref 8.4–10.5)
Creatinine, Ser: 1.1 mg/dL (ref 0.50–1.35)
GFR calc Af Amer: 78 mL/min — ABNORMAL LOW (ref >90)
GFR calc non Af Amer: 68 mL/min — ABNORMAL LOW (ref >90)
Glucose, Bld: 128 mg/dL — ABNORMAL HIGH (ref 70–99)
Sodium: 127 mEq/L — ABNORMAL LOW (ref 135–145)
Total Protein: 6.8 g/dL (ref 6.0–8.3)

## 2012-09-13 SURGERY — EGD (ESOPHAGOGASTRODUODENOSCOPY)
Anesthesia: Moderate Sedation

## 2012-09-13 MED ORDER — MIDAZOLAM HCL 10 MG/2ML IJ SOLN
INTRAMUSCULAR | Status: AC
  Filled 2012-09-13: qty 2

## 2012-09-13 MED ORDER — WARFARIN - PHARMACIST DOSING INPATIENT: Freq: Every day | Status: DC

## 2012-09-13 MED ORDER — SODIUM CHLORIDE 0.9 % IV SOLN: INTRAVENOUS | Status: DC

## 2012-09-13 MED ORDER — FENTANYL CITRATE 0.05 MG/ML IJ SOLN
INTRAMUSCULAR | Status: AC
  Filled 2012-09-13: qty 2

## 2012-09-13 MED ORDER — FUROSEMIDE 40 MG PO TABS
40.0000 mg | ORAL_TABLET | Freq: Every day | ORAL | Status: DC
  Administered 2012-09-13 – 2012-09-14 (×2): 40 mg via ORAL
  Filled 2012-09-13 (×2): qty 1

## 2012-09-13 MED ORDER — BUTAMBEN-TETRACAINE-BENZOCAINE 2-2-14 % EX AERO
INHALATION_SPRAY | CUTANEOUS | Status: DC | PRN
  Administered 2012-09-13: 2 via TOPICAL

## 2012-09-13 MED ORDER — ENOXAPARIN SODIUM 80 MG/0.8ML ~~LOC~~ SOLN
1.0000 mg/kg | Freq: Two times a day (BID) | SUBCUTANEOUS | Status: DC
  Administered 2012-09-13 – 2012-09-14 (×3): 80 mg via SUBCUTANEOUS
  Filled 2012-09-13 (×5): qty 0.8

## 2012-09-13 MED ORDER — WARFARIN SODIUM 7.5 MG PO TABS
7.5000 mg | ORAL_TABLET | Freq: Once | ORAL | Status: AC
  Administered 2012-09-13: 7.5 mg via ORAL
  Filled 2012-09-13: qty 1

## 2012-09-13 MED ORDER — FENTANYL CITRATE 0.05 MG/ML IJ SOLN
INTRAMUSCULAR | Status: DC | PRN
  Administered 2012-09-13: 15 ug via INTRAVENOUS
  Administered 2012-09-13: 25 ug via INTRAVENOUS
  Administered 2012-09-13: 10 ug via INTRAVENOUS

## 2012-09-13 MED ORDER — MIDAZOLAM HCL 10 MG/2ML IJ SOLN
INTRAMUSCULAR | Status: DC | PRN
  Administered 2012-09-13 (×2): 2 mg via INTRAVENOUS
  Administered 2012-09-13: 1 mg via INTRAVENOUS

## 2012-09-13 NOTE — Progress Notes (Signed)
[facility] PROGRESS NOTE  [name] ION:[id number] DOB: [birth year] DOA: 09/10/2012 PCP: Crist Fat, MD  Assessment/Plan: 1-Abdominal pain, acute, right upper quadrant: most likely subacute in nature, has been going on for the last 3 months. Dyspepsia (PUD vs H. Pylori infection) as reason for his symptoms.  -Continue using pain meds and supportive care  -continue PPI BID -hida scan normal; discuss with surgery and at this moment rec's are for not removal of gallbladder  -GI for EGD this morning  2-FIBRILLATION, ATRIAL: rate controlled.  -Will continue beta blocker  -continue anticoagulation (lovenox when INR less than 2 and resume coumadin after procedure)  3-SYSTOLIC HEART FAILURE, CHRONIC: EF 28%; s/p AICD placement  -Compensated, no CP or SOB  -Gentle hydration, close I' and O's and daily weight.  -patient to follow a low sodium diet  -resume lasix  4-Encounter for long-term (current) use of anticoagulants: due to A. Fib  -will continue lovenox and after procedure will resume coumadin.  -consult place to pharmacy to monitor and adjust anticoagulants  -INR1.83  5-Acute on chronic renal insufficiency: hx of stage I renal insufficiency by GFR; worse now most likely secondary to dehydration, continue use of lisinopril and increase dose of lasix for the last 2 days prior to admission.  -will continue holding lisinopril for today  -renal function now back to WNL. -will resume lasix -BMET in am  6-Dehydration with hyponatremia: no signs of dehydration at this point. Mild hyponatremia (sodium 128 corrected). Most likely due to decrease PO intake. After EGD will resume diet and follow electrolytes.  7-Transaminitis: LFT's trending down. Hida-scan normal. Recent Liver CT and upper quadrant US demonstrating just mild fatty inflitrate. GI to performed EGD today; will follow recommendations. Hepatic congestion (from CHF) could also explain elevated LFT's.  DVT: lovenox per  pharmacy once INR below 2   Code Status: Full code Family Communication: Wife by phone [phone]) Disposition Plan: Home when medically stable.   Consultants:  GI  Procedures:  Hida scan (pending)  Antibiotics:  None  HPI/Subjective: Afebrile, no nausea, no vomiting; patient denies any chest pain or shortness of breath. He still complaining of abdominal  discomfort on his right upper quadrant/epigastric area. Plan for EGD today.  Objective: Filed Vitals:   09/13/12 1415 09/13/12 1430 09/13/12 1445 09/13/12 1500  BP: 112/72 111/62 99/51 116/88  Pulse:      Temp:    97.4 F (36.3 C)  TempSrc:    Oral  Resp: 12 13 14 16   Height:      Weight:      SpO2: 96% 96% 90% 95%    Intake/Output Summary (Last 24 hours) at 09/13/12 1816 Last data filed at 09/13/12 0810  Gross per 24 hour  Intake    360 ml  Output      0 ml  Net    360 ml   Filed Weights   09/10/12 2109 09/12/12 0628 09/13/12 0810  Weight: 78.109 kg (172 lb 3.2 oz) 78.019 kg (172 lb) 78.2 kg (172 lb 6.4 oz)    Exam:   General:  Afebrile, no nausea or vomiting; still with abdominal pain (RUQ/epigastric area)  Cardiovascular: irregular, no rubs or gallops  Respiratory: CTA  Abdomen: soft, still with discomfort on palpation on his RUQ/epigastric area; positive BS  Extremities: no edema bilaterally  Neuro: non focal  Data Reviewed: Basic Metabolic Panel:  Lab 09/13/12 7253 09/12/12 0410 09/11/12 0425 09/10/12 1616 09/08/12 1516  NA 127* 132* 131* 131* 123*  K 4.1 4.1 4.2 4.2 5.0  CL 95* 100 97 95* 89*  CO2 22 23 24 26 22   GLUCOSE 128* 88 101* 109* 98  BUN 15 16 17 20 19   CREATININE 1.10 1.18 1.39* 1.55* 1.16  CALCIUM 8.4 8.4 8.8 8.8 8.9  MG -- -- -- -- --  PHOS -- -- -- -- --   Liver Function Tests:  Lab 09/13/12 0405 09/11/12 0425 09/10/12 1616 09/08/12 1516  AST 39* 77* 87* 150*  ALT 82* 120* 126* 140*  ALKPHOS 87 94 96 97  BILITOT 1.6* 1.8* 1.7* 3.1*  PROT 6.8 7.1 7.2 7.8  ALBUMIN  3.0* 3.2* 3.4* 3.5    Lab 09/10/12 1616 09/08/12 1516  LIPASE 22 18  AMYLASE -- --   CBC:  Lab 09/11/12 0425 09/10/12 1616 09/08/12 1516  WBC 5.0 4.8 5.8  NEUTROABS -- 3.5 4.0  HGB 11.3* 11.0* 12.1*  HCT 32.7* 31.5* 34.9*  MCV 95.1 94.3 94.6  PLT 228 211 225    Recent Results (from the past 240 hour(s))  CLOSTRIDIUM DIFFICILE BY PCR     Status: Normal   Collection Time   09/07/12  4:04 PM      Component Value Range Status Comment   C difficile by pcr Not Detected  Not Detected Final      Studies: No results found.  Scheduled Meds:    . sodium chloride   Intravenous Once  . antiseptic oral rinse  15 mL Mouth Rinse q12n4p  . carvedilol  80 mg Oral Daily  . chlorhexidine  15 mL Mouth Rinse BID  . digoxin  0.125 mg Oral QODAY  . enoxaparin (LOVENOX) injection  1 mg/kg Subcutaneous Q12H  . famotidine  40 mg Oral BID  . feeding supplement  1 Container Oral TID BM  . furosemide  40 mg Oral Daily  . multivitamin with minerals  1 tablet Oral Daily  . warfarin  7.5 mg Oral ONCE-1800  . Warfarin - Pharmacist Dosing Inpatient   Does not apply q1800   Continuous Infusions:    . DISCONTD: sodium chloride      Time spent: >30 minutes    Daxon Kyne  [facility] Pager [phone]. If 8PM-8AM, please contact night-coverage at [email or web address], password [facility] 09/13/2012, 6:16 PM  LOS: 3 days

## 2012-09-13 NOTE — Op Note (Signed)
[facility] [address] [place], [address]   ENDOSCOPY PROCEDURE REPORT  PATIENT: [name], [name]  MR#: [id number] BIRTHDATE: 1946-04-29 , 65  yrs. old GENDER: Male ENDOSCOPIST: Meryl Dare, MD, [facility] REFERRED BY:  [facility] PROCEDURE DATE:  09/13/2012 PROCEDURE:  EGD, diagnostic ASA CLASS:     Class IV INDICATIONS:  epigastric pain, anorexa,  weight loss. MEDICATIONS: medications were titrated to patient response per physician's verbal order, Fentanyl 50 mcg IV, and Versed 5 mg IV TOPICAL ANESTHETIC: Cetacaine Spray DESCRIPTION OF PROCEDURE: After the risks benefits and alternatives of the procedure were thoroughly explained, informed consent was obtained.  The Pentax Gastroscope [id number] endoscope was introduced through the mouth and advanced to the second portion of the duodenum. Without limitations.  The instrument was slowly withdrawn as the mucosa was fully examined.   ESOPHAGUS: The mucosa of the esophagus appeared normal. STOMACH: The mucosa of the stomach appeared normal.  Normal gastric folds and normal gastric distention. DUODENUM: The duodenal mucosa showed no abnormalities in the bulb and second portion of the duodenum.  Retroflexed views revealed no abnormalities.  The scope was then withdrawn from the patient and the procedure completed.  COMPLICATIONS: There were no complications.  ENDOSCOPIC IMPRESSION: 1.   EGD appeared normal  RECOMMENDATIONS: 1.  continue PPI   eSigned:  Meryl Dare, MD, [facility] 09/13/2012 1:34 PM   ON:[id number], Vonna Kotyk MD

## 2012-09-13 NOTE — Progress Notes (Signed)
ANTICOAGULATION CONSULT NOTE - Initial Consult  Pharmacy Consult for Coumadin Indication: atrial fibrillation  Allergies  Allergen Reactions  . Ace Inhibitors   . Amlodipine Besylate   . Codeine   . Corzide (Nadolol-Bendroflumethiazide)   . Diovan (Valsartan)   . Flagyl (Metronidazole)   . Latex   . Norvasc (Amlodipine Besylate)   . Omeprazole   . Penicillins     Patient Measurements: Height: 5' 10.5" (179.1 cm) Weight: 172 lb 6.4 oz (78.2 kg) IBW/kg (Calculated) : 74.15   Labs:  Basename 09/13/12 0405 09/12/12 0410 09/11/12 0425  HGB -- -- 11.3*  HCT -- -- 32.7*  PLT -- -- 228  APTT -- -- --  LABPROT 20.6* 22.7* 23.0*  INR 1.84* 2.10* 2.14*  HEPARINUNFRC -- -- --  CREATININE 1.10 1.18 1.39*  CKTOTAL -- -- --  CKMB -- -- --  TROPONINI -- -- --    Estimated Creatinine Clearance: 68.4 ml/min (by C-G formula based on Cr of 1.1).  Medical History: Past Medical History  Diagnosis Date  . Ischemic cardiomyopathy     Myoview 02/2011  EF 28%  Coronary artery disease  (total occlusion of the right coronary artery, left-to-right )  . Atrial flutter     s/p TEE guided cardioversion  . Nonsustained ventricular tachycardia   . LBBB (left bundle branch block)   . Chronic drug-induced interstitial lung disorders   . Acute on chronic renal insufficiency   . Coronary artery disease     total occlusion of the right coronary artery, left to right collaterals.  He had Cypher stenting to the circumflex in  July 2003  . Seizure disorder   . AICD (automatic cardioverter/defibrillator) present     Medtronic CRT  . Hyponatremia   . 6949-lead     replaced 02/2011  . Hyperlipemia   . Hypertension   . Fatty liver     CT  . Family hx of colon cancer     2 SISTERS    Assessment:  10 yoM on coumadin PTA for Afib admitted with acute abdominal pain and acute on chronic renal failure.  Coumadin was placed on hold and Lovenox started this AM when INR < 2 as ordered by MD. Pt is now  s/p normal EGD and MD ordered to resume Coumadin per pharmacy. Per [facility] - patient was on Coumadin 5mg  daily EXCEPT 7.5mg  on MWF PTA - last Coumadin dose 10/25. Patient received Vitamin K 1mg  po x 1 on 10/27 inpatient.   INR this AM 1.84.  Scr improved greatly. Last CBC ok 10/26  Goal of Therapy:  INR 2-3 Monitor platelets by anticoagulation protocol: Yes   Plan:   Coumadin 7.5 mg po x 1 tonight  Daily PT/INR  Will continue Lovenox for now, pharmacy will f/u with PT/INR in AM and determine if Lovenox should be continued.  Geoffry Paradise Thi 09/13/2012,4:57 PM

## 2012-09-13 NOTE — Progress Notes (Signed)
ANTICOAGULATION CONSULT NOTE - Follow Up  Pharmacy Consult for Lovenox Indication: atrial fibrillation  Patient Measurements: Height: 5' 10.5" (179.1 cm) Weight: 172 lb (78.019 kg) IBW/kg (Calculated) : 74.15  Heparin Dosing Weight: 79.4kg (as of 09/07/2012)  Labs:  Basename 09/13/12 0405 09/12/12 0410 09/11/12 0425 09/10/12 1616  HGB -- -- 11.3* 11.0*  HCT -- -- 32.7* 31.5*  PLT -- -- 228 211  APTT -- -- -- --  LABPROT 20.6* 22.7* 23.0* --  INR 1.84* 2.10* 2.14* --  HEPARINUNFRC -- -- -- --  CREATININE 1.10 1.18 1.39* --  CKTOTAL -- -- -- --  CKMB -- -- -- --  TROPONINI -- -- -- --    Medications:  Coumadin PTA 5mg  qday except 7.5mg  MWF  Assessment: 65 yoM on coumadin PTA for Afib admitted with acute abdominal pain and acute on chronic renal failure.  Pt to start LMWH once INR <2 for anticipated EGD procedures.     ARF: SCr improving, 1.1 <1.18 < 1.39 < 1.55, CrCl~63 ml/min  Plts/Hgb stable  Last warfarin dose: pt reports taking 7.5 mg dose 10/25  INR down to 1.84 this AM - patient to begin Lovenox today.  Goal of Therapy:  Anti-Xa level 0.6-1.2 units/ml 4hrs after LMWH dose given Monitor platelets by anticoagulation protocol: Yes  PT/INR labs in the morning  Plan:  1. Start Lovenox 80 mg SQ q 12 hours 2. CBC every 72 hours 3. D/c daily PT/INR 4. Will continue to follow daily and adjust accordingly  Tammy Sours, Pharm.D. Clinical Oncology Pharmacist  Pager # [phone]  09/13/2012,7:15 AM

## 2012-09-13 NOTE — Consult Note (Addendum)
Referring Provider: No ref. provider found Primary Care Physician:  Crist Fat, MD Primary Gastroenterologist:  Erick Blinks, MD  Reason for Consultation:  Abdominal pain  HPI: [name] is a 66 y.o. male who was first seen by GI at our office on 09/08/13, referred by [facility] primary care. He has multiple serious medical comorbidities, primarily surrounding heart disease, with history of an ischemic cardiomyopathy and EF of about 25%, and chronic systolic heart failure. He has an ICD in place and is maintained on chronic Coumadin, with history of atrial fib/flutter. He also has history of chronic renal insufficiency. He is followed by [facility] cardiology.  He initially had complaints of diarrhea, nausea, dry heaves, and lower abdominal pain.  Now he says that the diarrhea has resolved, but he still persists with nausea.  The abdominal pain is located in his mid-abdomen and epigastric regions now rather than the lower abdomen.  Describes the pain as somewhat constant, but comes stronger in waves.  Says that it feels like a sharp pressure and gets worse when he is lying down.  Does not occur only after eating.  In fact, he has not eaten much at all.    He had CT scan of the abdomen and pelvis done 08/03/2012 which was done without IV contrast and did show small bilateral effusions cardiomegaly a contracted gallbladder with wall of 6.6 mm but no pericholecystic fluid and there was some minimal stranding in the pericolonic fat in the right lower abdomen. He was treated empirically for possible diverticulitis with a course of Cipro which did not improve his symptoms. He was also given Bentyl to use for cramping but did not find that helpful and stopped it. He had repeat CT scan done 09/02/2012 with IV contrast. This showed cardiomegaly bilateral small pleural effusions right greater than left atherosclerotic calcifications of the aorta and iliac arteries no renal calcifications were noted no gallstone all  bladder was contracted , there was a small amount of free pelvic fluid but no bowel wall thickening etc. other was limited assessment of the sigmoid colon because of underdistention.  His energy level is significantly decreased and his appetite has been decreased as well and he says that he has lost about 15 pounds since onset of this illness. He has not had prior colonoscopy. Family history is positive for colon cancer in 2 of his sisters, one recently deceased.  Denies dark or bloody stool.  HIDA scan on this admission was normal.  He is hyponatremic with a sodium of 121 at one point during a recent ER visit.  Mentions that occasionally pills get stuck when he tries to swallow them.   Past Medical History  Diagnosis Date  . Ischemic cardiomyopathy     Myoview 02/2011  EF 28%  Coronary artery disease  (total occlusion of the right coronary artery, left-to-right )  . Atrial flutter     s/p TEE guided cardioversion  . Nonsustained ventricular tachycardia   . LBBB (left bundle branch block)   . Chronic drug-induced interstitial lung disorders   . Acute on chronic renal insufficiency   . Coronary artery disease     total occlusion of the right coronary artery, left to right collaterals.  He had Cypher stenting to the circumflex in  July 2003  . Seizure disorder   . AICD (automatic cardioverter/defibrillator) present     Medtronic CRT  . Hyponatremia   . 6949-lead     replaced 02/2011  . Hyperlipemia   .  Hypertension   . Fatty liver     CT  . Family hx of colon cancer     2 SISTERS    Past Surgical History  Procedure Date  . Insert / replace / remove pacemaker     AICD medtronic  . Hernia repair     Prior to Admission medications   Medication Sig Start Date End Date Taking? Authorizing Provider  Calcium-Magnesium 500-250 MG TABS Take 1 tablet by mouth daily.    Yes Historical Provider, MD  carvedilol (COREG CR) 80 MG 24 hr capsule Take 1 capsule (80 mg total) by mouth daily.  07/26/12  Yes [name], MD  co-enzyme Q-10 30 MG capsule Take 30 mg by mouth 3 (three) times daily.     Yes Historical Provider, MD  colchicine 0.6 MG tablet Take 0.6 mg by mouth daily as needed. For gout flareups.   Yes Historical Provider, MD  Digestive Enzymes (PAPAYA ENZYME PO) Take 1 capsule by mouth 3 (three) times daily.    Yes Historical Provider, MD  digoxin (LANOXIN) 0.125 MG tablet Take 0.125 mg by mouth every other day.   Yes Historical Provider, MD  diphenhydrAMINE (BENADRYL) 25 mg capsule Take 25 mg by mouth every 6 (six) hours as needed. For allergies.   Yes Historical Provider, MD  fish oil-omega-3 fatty acids 1000 MG capsule Take 2 g by mouth daily.   Yes Historical Provider, MD  furosemide (LASIX) 40 MG tablet Take 40 mg by mouth daily. For swelling.   Yes Historical Provider, MD  Chilton Si Tea, Camillia sinensis, (GREEN TEA PO) Take 1 capsule by mouth daily.    Yes Historical Provider, MD  lisinopril (PRINIVIL,ZESTRIL) 20 MG tablet Take 20 mg by mouth daily.   Yes Historical Provider, MD  Multiple Vitamin (MULTIVITAMIN WITH MINERALS) TABS Take 1 tablet by mouth daily.   Yes Historical Provider, MD  nitroGLYCERIN (NITROSTAT) 0.4 MG SL tablet Place 1 tablet (0.4 mg total) under the tongue every 5 (five) minutes as needed. 06/03/12  Yes [name], MD  ondansetron (ZOFRAN ODT) 4 MG disintegrating tablet 4mg  ODT q4 hours prn nausea/vomit 09/08/12  Yes Richardean Canal, MD  oxyCODONE-acetaminophen (PERCOCET) 5-325 MG per tablet Take 2 tablets by mouth every 4 (four) hours as needed for pain. 09/08/12  Yes Richardean Canal, MD  potassium chloride SA (K-DUR,KLOR-CON) 20 MEQ tablet Take 1 tablet (20 mEq total) by mouth daily. 06/01/12  Yes [name], MD  saccharomyces boulardii (FLORASTOR) 250 MG capsule Take 250 mg by mouth 2 (two) times daily.   Yes Historical Provider, MD  traMADol (ULTRAM) 50 MG tablet Take 50 mg by mouth every 6 (six) hours as needed. For pain.   Yes Historical Provider,  MD  VALERIAN ROOT PO Take 1 capsule by mouth daily.    Yes Historical Provider, MD  Vancomycin HCl (VANCOCIN HCL PO) Take 250 mg by mouth 4 (four) times daily.    Yes Historical Provider, MD  vitamin C (ASCORBIC ACID) 500 MG tablet Take 500 mg by mouth daily.   Yes Historical Provider, MD  warfarin (COUMADIN) 5 MG tablet Take 5-7.5 mg by mouth daily. 1 tab daily except 1.5 tabs daily on Mondays, Wednesdays and Fridays.   Yes Historical Provider, MD    Current Facility-Administered Medications  Medication Dose Route Frequency Provider Last Rate Last Dose  . 0.9 %  sodium chloride infusion   Intravenous Once [place], [place]      . acetaminophen (TYLENOL) tablet 650  mg  650 mg Oral Q6H PRN Vassie Loll, MD       Or  . acetaminophen (TYLENOL) suppository 650 mg  650 mg Rectal Q6H PRN Vassie Loll, MD      . antiseptic oral rinse (BIOTENE) solution 15 mL  15 mL Mouth Rinse q12n4p Sonnie Alamo, RN   15 mL at 09/12/12 1207  . carvedilol (COREG CR) 24 hr capsule 80 mg  80 mg Oral Daily Vassie Loll, MD   80 mg at 09/13/12 1021  . chlorhexidine (PERIDEX) 0.12 % solution 15 mL  15 mL Mouth Rinse BID Sonnie Alamo, RN   15 mL at 09/13/12 0808  . digoxin (LANOXIN) tablet 0.125 mg  0.125 mg Oral Herbie Baltimore, MD   0.125 mg at 09/12/12 0917  . enoxaparin (LOVENOX) injection 80 mg  1 mg/kg Subcutaneous Q12H Kristina Sameh Hazard, PHARMD   80 mg at 09/13/12 0808  . famotidine (PEPCID) tablet 40 mg  40 mg Oral BID Vassie Loll, MD   40 mg at 09/13/12 1021  . feeding supplement (RESOURCE BREEZE) liquid 1 Container  1 Container Oral TID BM Gus Rankin, RD   1 Container at 09/12/12 1954  . morphine 2 MG/ML injection 1 mg  1 mg Intravenous Q3H PRN Vassie Loll, MD   1 mg at 09/13/12 0840  . multivitamin with minerals tablet 1 tablet  1 tablet Oral Daily Vassie Loll, MD   1 tablet at 09/13/12 1021  . ondansetron (ZOFRAN) tablet 4 mg  4 mg Oral Q6H PRN Vassie Loll, MD       Or  . ondansetron  [facility]) injection 4 mg  4 mg Intravenous Q6H PRN Vassie Loll, MD      . oxyCODONE (Oxy IR/ROXICODONE) immediate release tablet 5 mg  5 mg Oral Q6H PRN Vassie Loll, MD   5 mg at 09/12/12 1952  . phenol (CHLORASEPTIC) mouth spray 2 spray  2 spray Mouth/Throat Q4H PRN Vassie Loll, MD      . phytonadione (VITAMIN K) oral solution 1 mg/0.5 mL  1 mg Oral Once Vassie Loll, MD   1 mg at 09/12/12 1312  . DISCONTD: phytonadione (VITAMIN K) NICU Injection 1 mg/0.5 mL  0.5 mg Intramuscular Once Vassie Loll, MD        Allergies as of 09/10/2012 - Review Complete 09/10/2012  Allergen Reaction Noted  . Ace inhibitors  09/07/2012  . Amlodipine besylate  01/31/2009  . Codeine  01/31/2009  . Corzide (nadolol-bendroflumethiazide)  09/07/2012  . Diovan (valsartan)  09/07/2012  . Flagyl (metronidazole)  01/31/2009  . Latex  01/31/2009  . Norvasc (amlodipine besylate)  09/07/2012  . Omeprazole  01/31/2009  . Penicillins  01/31/2009    Family History  Problem Relation Age of Onset  . Heart disease Mother   . Heart disease Father   . Colon cancer Sister     BOTH SISTERS 1 DESEASED FROM COLON CANCER  . Colon cancer Sister     History   Social History  . Marital Status: Married    Spouse Name: N/A    Number of Children: N/A  . Years of Education: N/A   Occupational History  . RETIRED    Social History Main Topics  . Smoking status: Never Smoker   . Smokeless tobacco: Never Used  . Alcohol Use: 0.0 oz/week     OCCASIONALLY NOT OFTEN  . Drug Use: No  . Sexually Active: Not on file   Other Topics Concern  .  Not on file   Social History Narrative  . No narrative on file    Review of Systems: Ten point ROS is O/W negative except as mentioned in HPI.    Physical Exam: Vital signs in last 24 hours: Temp:  [97.3 F (36.3 C)-97.6 F (36.4 C)] 97.3 F (36.3 C) (10/27 2139) Pulse Rate:  [64-67] 64  (10/27 2139) Resp:  [16-18] 18  (10/27 2139) BP: (119)/(74-84) 119/84 mmHg  (10/27 2139) SpO2:  [96 %-97 %] 97 % (10/27 2139) Weight:  [172 lb 6.4 oz (78.2 kg)] 172 lb 6.4 oz (78.2 kg) (10/28 0810) Last BM Date: 09/10/12 General:   Alert, Well-developed, well-nourished, pleasant and cooperative in NAD. Head:  Normocephalic and atraumatic. Eyes:  Sclera clear, no icterus.  Conjunctiva pink. Ears:  Normal auditory acuity. Mouth:  No deformity or lesions.   Lungs:  Clear throughout to auscultation.  No wheezes, crackles, or rhonchi.  Heart:  Irregular; no murmurs, clicks, rubs,  or gallops. Abdomen:  Soft, nondistended, BS active, mild TTP in epigastrium and mid-abdomen. Rectal:  Deferred.  Msk:  Symmetrical without gross deformities. Pulses:  Normal pulses noted. Extremities:  Without clubbing or edema. Neurologic:  Alert and  oriented x4;  grossly normal neurologically. Skin:  Intact without significant lesions or rashes. Psych:  Alert and cooperative. Normal mood and affect.  Intake/Output from previous day: 10/27 0701 - 10/28 0700 In: 700 [P.O.:700] Out: 350 [Urine:350]  Lab Results:  [facility] 09/11/12 0425 09/10/12 1616  WBC 5.0 4.8  HGB 11.3* 11.0*  HCT 32.7* 31.5*  PLT 228 211   BMET  Basename 09/13/12 0405 09/12/12 0410 09/11/12 0425  NA 127* 132* 131*  K 4.1 4.1 4.2  CL 95* 100 97  CO2 22 23 24   GLUCOSE 128* 88 101*  BUN 15 16 17   CREATININE 1.10 1.18 1.39*  CALCIUM 8.4 8.4 8.8   LFT  Basename 09/13/12 0405  PROT 6.8  ALBUMIN 3.0*  AST 39*  ALT 82*  ALKPHOS 87  BILITOT 1.6*  BILIDIR --  IBILI --   PT/INR  Basename 09/13/12 0405 09/12/12 0410  LABPROT 20.6* 22.7*  INR 1.84* 2.10*    Studies/Results: Nm Hepato W/eject Fract  09/11/2012  *RADIOLOGY REPORT*  Clinical Data:  Right upper quadrant pain.  NUCLEAR MEDICINE HEPATOBILIARY IMAGING WITH GALLBLADDER EF  Technique:  Sequential images of the abdomen were obtained out to 60 minutes following intravenous administration of radiopharmaceutical. After oral ingestion of  Ensure, gallbladder ejection fraction was determined.  Radiopharmaceutical:  5.5 mCi Tc-63m Choletec  Comparison: Ultrasound 09/08/2012  Findings: There is uniform uptake radiotracer within the liver. Counts are excreted into the bowel by 35 minutes.  The gallbladder is visualized by 30 minutes.  Upon administration of the fatty meal, the gallbladder contracts appropriately with a calculated ejection fraction equal 59%.  Gallbladder ejection fraction:  59%. Normal gallbladder ejection fraction with Ensure is greater than 33%.  IMPRESSION:  1.  Normal gallbladder ejection fraction equals 59%. 2.  Patent cystic duct and common bile duct.   Original Report Authenticated By: Genevive Bi, M.D.     IMPRESSION:  #1 Abdominal pain, epigastric/mid-abdomen.  Patient now with associated fatigue, anorexia and weight loss.  Consider ulcer disease, duodenitis in the differential.  Also consideration would be mesenteric insufficiency given his severe cardiomyopathy. #2 positive family history of colon cancer-patient has not had colon surveillance. Recent CT's do not show any evidence of colonic lesion. He is a high risk candidate for elective colonoscopy. #  3 chronic anticoagulation with Coumadin  #4 ischemic cardiomyopathy EF of 25% and chronic systolic heart failure  #5 status post AICD  #6 history of atrial fib/flutter  #7 chronic renal insufficiency #8 hyonatremia #9 elevated transaminases from fatty liver or hepatic congestion   PLAN: -EGD today.  Further plans to follow results of this procedure. -Continue current care otherwise for now.   ZEHR, JESSICA D.  09/13/2012, 11:05 AM  Pager number [phone]   I have taken a history, examined the patient and reviewed the chart. I agree with the Shanda Bumps Zehr's note, impression and recommendations. Epigastric and mid abdominal pain with anorexia, wt loss. EGD today to R/U ulcer, neoplasm, gastritis, duodenitis. Possible mesenteric insufficiency from  cardiomyopathy.  Family history of colon cancer. Consider CT colonography or ACBE. Not a good candidate for colonoscopy given cardiac comorbidities.  Mildly elevated transaminases likely from fatty liver or hepatic congestion.  Na has decreased to 127. INR=1.8.  Meryl Dare MD [facility]

## 2012-09-13 NOTE — Interval H&P Note (Signed)
History and Physical Interval Note:  09/13/2012 1:02 PM  [name]  has presented today for surgery, with the diagnosis of Epigastric abdominal pain  The various methods of treatment have been discussed with the patient and family. After consideration of risks, benefits and other options for treatment, the patient has consented to  Procedure(s) (LRB) with comments: ESOPHAGOGASTRODUODENOSCOPY (EGD) (N/A) as a surgical intervention .  The patient's history has been reviewed, patient examined, no change in status, stable for surgery.  I have reviewed the patient's chart and labs.  Questions were answered to the patient's satisfaction.     Venita Lick. Russella Dar MD [facility]

## 2012-09-14 ENCOUNTER — Encounter (HOSPITAL_COMMUNITY): Payer: Self-pay

## 2012-09-14 ENCOUNTER — Encounter (HOSPITAL_COMMUNITY): Payer: Self-pay | Admitting: Gastroenterology

## 2012-09-14 DIAGNOSIS — I10 Essential (primary) hypertension: Secondary | ICD-10-CM

## 2012-09-14 LAB — BASIC METABOLIC PANEL
Chloride: 97 mEq/L (ref 96–112)
GFR calc Af Amer: 68 mL/min — ABNORMAL LOW (ref >90)
GFR calc non Af Amer: 58 mL/min — ABNORMAL LOW (ref >90)
Potassium: 3.9 mEq/L (ref 3.5–5.1)
Sodium: 130 mEq/L — ABNORMAL LOW (ref 135–145)

## 2012-09-14 LAB — CBC
HCT: 33.4 % — ABNORMAL LOW (ref 39.0–52.0)
Hemoglobin: 11.5 g/dL — ABNORMAL LOW (ref 13.0–17.0)
MCHC: 34.4 g/dL (ref 30.0–36.0)
RDW: 14.5 % (ref 11.5–15.5)
WBC: 4 10*3/uL (ref 4.0–10.5)

## 2012-09-14 LAB — PROTIME-INR
INR: 1.63 — ABNORMAL HIGH (ref 0.00–1.49)
Prothrombin Time: 18.8 seconds — ABNORMAL HIGH (ref 11.6–15.2)

## 2012-09-14 MED ORDER — FAMOTIDINE 40 MG PO TABS: 40.0000 mg | ORAL_TABLET | Freq: Two times a day (BID) | ORAL | Status: DC

## 2012-09-14 MED ORDER — WARFARIN SODIUM 7.5 MG PO TABS
7.5000 mg | ORAL_TABLET | Freq: Once | ORAL | Status: DC
  Filled 2012-09-14: qty 1

## 2012-09-14 MED ORDER — OXYCODONE-ACETAMINOPHEN 5-325 MG PO TABS: 1.0000 | ORAL_TABLET | Freq: Four times a day (QID) | ORAL | Status: DC | PRN

## 2012-09-14 MED ORDER — WARFARIN SODIUM 5 MG PO TABS: 5.0000 mg | ORAL_TABLET | Freq: Every day | ORAL | Status: DC

## 2012-09-14 NOTE — Progress Notes (Signed)
Pt was stable and no significant updates to initial shift assessment. Completed discharge education with wife and pt. Answered questions about medications and follow up appointments. Pt was nervous about waiting to see doctor 2-3 weeks from now. I reviewed plan of care that pt should follow and clarified questions pt and wife had. Pt and wife verbalized concerns about pain management. Reviewed script including home pain medication with pt and gave pt Oxy IR 5 mg at 1249 before discharged. Continued to monitor pt until he left with wife.

## 2012-09-14 NOTE — Progress Notes (Signed)
[facility] Progress Note  Subjective:  Normal EGD 10/28.  Feels a little better, but still gets waves of pain.  Was heme negative a few days ago.  Objective:  Vital signs in last 24 hours: Temp:  [97.4 F (36.3 C)-98.4 F (36.9 C)] 98.4 F (36.9 C) (10/29 0455) Pulse Rate:  [70-102] 78  (10/29 0455) Resp:  [12-33] 16  (10/29 0455) BP: (99-141)/(49-101) 124/87 mmHg (10/29 0455) SpO2:  [88 %-100 %] 97 % (10/29 0455) Weight:  [180 lb (81.647 kg)] 180 lb (81.647 kg) (10/29 0455) Last BM Date: 09/10/12  General:   Alert, Well-developed, in NAD Heart:  Irregularly irregular; no murmurs Pulm:  CTAB.  No W/R/R. Abdomen:  Soft, nondistended. Normal bowel sounds.  Mild mid-abdominal TTP without R/R/G.  Extremities:  +1 pitting edema in B/L LE's. Neurologic:  Alert and  oriented x4;  grossly normal neurologically. Psych:  Alert and cooperative. Normal mood and affect.  Intake/Output from previous day: 10/28 0701 - 10/29 0700 In: 0  Out: 975 [Urine:975]  Lab Results:  [facility] 09/14/12 0430  WBC 4.0  HGB 11.5*  HCT 33.4*  PLT 236   BMET  Basename 09/14/12 0430 09/13/12 0405 09/12/12 0410  NA 130* 127* 132*  K 3.9 4.1 4.1  CL 97 95* 100  CO2 24 22 23   GLUCOSE 83 128* 88  BUN 15 15 16   CREATININE 1.24 1.10 1.18  CALCIUM 8.3* 8.4 8.4   LFT  Basename 09/13/12 0405  PROT 6.8  ALBUMIN 3.0*  AST 39*  ALT 82*  ALKPHOS 87  BILITOT 1.6*  BILIDIR --  IBILI --   PT/INR  Basename 09/14/12 0430 09/13/12 0405  LABPROT 18.8* 20.6*  INR 1.63* 1.84*   Assessment / Plan: #1 Abdominal pain, epigastric/mid-abdomen with associated fatigue, anorexia and weight loss.  Consideration would be mesenteric insufficiency given his severe cardiomyopathy.  Otherwise we are unsure of the source of his pain at this time.  EGD normal 10/28. #2 positive family history of colon cancer-patient has not had colon surveillance. Recent CT's do not show any evidence of colonic lesion. He  is a higher risk candidate for elective colonoscopy.  #3 chronic anticoagulation with Coumadin  #4 ischemic cardiomyopathy EF of 25% and chronic systolic heart failure  #5 status post AICD  #6 history of atrial fib/flutter  #7 chronic renal insufficiency  #8 hyonatremia  #9 elevated transaminases from fatty liver or hepatic congestion from CHF   -Patient is agreeable to stop all of his supplements for two more weeks at home to see if his abdominal pain resolves without them. -Can follow-up with Dr. Rhea Belton as an outpatient to discuss colonoscopy vs barium enema vs CT colonography, etc.   LOS: 4 days   ZEHR, JESSICA D.  09/14/2012, 9:27 AM  Pager number [phone]    I have taken an interval history, reviewed the chart and examined the patient. I agree with Otho Darner note, impression and recommendations.   Venita Lick. Russella Dar MD [facility]

## 2012-09-14 NOTE — Discharge Summary (Signed)
Physician Discharge Summary  [name] WUJ:[id number] DOB: [birth year] DOA: 09/10/2012  PCP: Crist Fat, MD  Admit date: 09/10/2012 Discharge date: 09/14/2012  Time spent: >30 minutes  Recommendations for Outpatient Follow-up:  -Arrange follow up with PCP in 2 weeks (will need BMET to follow renal function and electrolytes) -Arrange follow up with GI specialist (Dr. Margretta Sidle) in 5-7 days -The majority of natural supplements that you are taking can be associated with GI symptoms and even liver side effects; give a trial of no other medications other than prescribed drugs for 2-3 weeks and see how you feel. -Take medications as prescribed. -Keep yourself hydrated.   Discharge Diagnoses:  Active Problems:  FIBRILLATION, ATRIAL  SYSTOLIC HEART FAILURE, CHRONIC  Encounter for long-term (current) use of anticoagulants  Acute on chronic renal insufficiency  Dehydration with hyponatremia  Transaminitis  Gallbladder anomaly  Abdominal pain, acute, right upper quadrant  Loss of weight   Discharge Condition: stable; still with some abdominal discomfort, but overall better. Patient has been instructed to try at least 2-3 weeks of all the natural supplements he is taking, which can also cause gi side effects. EGD negative; he will continue follow up as an outpatient with GI specialist (Dr. Margretta Sidle) for further evaluation/work up. Patient started on Pepcid BID.   Diet recommendation: heart healthy diet  Filed Weights   09/12/12 0628 09/13/12 0810 09/14/12 0455  Weight: 78.019 kg (172 lb) 78.2 kg (172 lb 6.4 oz) 81.647 kg (180 lb)    History of present illness:  [name] is a 66 y.o. male past medical history significant for atrial fibrillation (on chronic Coumadin), renal insufficiency (stage I), coronary artery disease, chronic systolic heart failure (ejection fraction 20%, status post AICD), hypertension, fatty liver and also significant history of colon cancer in his family;  came to the hospital secondary to subacute abdominal pain that has been going on for the last 3 months. Patient reports having significant imaging studies including CT scan, ultrasound, and also x-rays without any particular definitive diagnosis for his problem he also had experienced some diarrhea and has finish treatment for presumed C.diff; despite having negative C. Diff by PCR test twice. He was recently seen (2 days ago) in the emergency department where he was found dehydrated, with elevated LFTs and with ongoing complaints of his abdominal pain. Patient was seen by his primary gastroenterologist today who has refer the patient to the hospital for further evaluation and treatment. In the emergency department on his last visit he received 2 L of fluids as part of the resuscitation process for his dehydration and since then he has notices some swelling of his legs reason why he was instructed to increase the Lasix to double the dose of what he normally takes at home.   On the day of admission patient was still complaining of abdominal pain, reports decreased appetite and inability to keep himself well-hydrated do to the discomfort on his abdomen; was found in the ED with hyponatremia (even improve from previous visit), hypochloremia, acute renal failure (creatinine 1.55) and also with elevated LFT's (also improved from previous visit). [facility] has been called to admit the patient for further evaluation and treatment.   Of note patient denies any hematochezia, hematemesis, fever, chills, cough, shortness of breath, chest pain or any other complaints.    Hospital Course:  1-Abdominal pain, acute, right upper quadrant: most likely subacute in nature, has been going on for the last 3 months. Dyspepsia (PUD vs H. Pylori  infection) has been at this point practically R/O with normal EGD; other considerations is medications (especially natural supplements and probiotics that he is on)  -Will  discharge on PRN pain meds  -continue Pepcid BID  -hida scan normal; discuss with surgery and at this moment rec's are for not gallbladder removal  -GI performed EGD and findings were WNL. -Patient encourage for a trial off natural supplements and will follow with GI for further evaluation and treatment as an outpatient.    2-FIBRILLATION, ATRIAL: rate controlled.  -Will continue beta blocker  -continue anticoagulation (coumadin)   3-SYSTOLIC HEART FAILURE, CHRONIC: EF 28%; s/p AICD placement  -Compensated, no CP or SOB  -continue ACE-inhibitor, lasix and B-blocker.  -patient to follow a low sodium diet and to follow with PCP at discharge   4-Encounter for long-term (current) use of anticoagulants: due to A. Fib  -will resume coumadin.  -INR goal is 2-3; no bridging needed.   5-Acute on chronic renal insufficiency: hx of stage I renal insufficiency by GFR; worse now most likely secondary to dehydration, continue use of lisinopril and increase dose of lasix for the last 2 days prior to admission.  -renal function now back to WNL.  -will resume lasix and lisinopril -BMET to be follow by PCP on follow up visit.   6-Dehydration with hyponatremia: Resolved, with gentle hydration and as soon as his diet was resumed.  7-Transaminitis: LFT's trending down. Hida-scan normal. Recent Liver CT and upper quadrant US demonstrating just mild fatty inflitrate. EGD today WNL; will follow with GI as an outpatient for any further evaluation and/or recommendations. Hepatic congestion (from CHF) could also explain elevated LFT's; also the use of natural supplements that he is on (valaroo root, Co-Q 10).  * the rest of his medical problems remains stable, and the plan is to continue current medication regimen.  Procedures:  See below  Consultations:  GI  [facility] (curbside to discuss CT scan of his abdomen and Hida scan; Dr. Carolynne Edouard III; no surgery indicated)  Discharge Exam: Filed Vitals:   09/13/12  1500 09/13/12 2030 09/14/12 0455 09/14/12 1012  BP: 116/88 120/81 124/87   Pulse:  70 78 80  Temp: 97.4 F (36.3 C) 98 F (36.7 C) 98.4 F (36.9 C)   TempSrc: Oral Oral Oral   Resp: 16 16 16    Height:      Weight:   81.647 kg (180 lb)   SpO2: 95% 99% 97%     General: NAD; still with some abdominal discomfort, but overall better. Afebrile Cardiovascular: Irregular, rate control; no rubs or gallops Respiratory: CTA bilaterally Abdomen: mild discomfort on deep palpation in the RUQ/epigastric area; positive BS, no distension and no guarding Extremities: no edema, no cyanosis Neuro: non focal  Discharge Instructions  Discharge Orders    Future Appointments: Provider: Department: Dept Phone: Center:   09/22/2012 12:30 PM [facility] [phone] None   10/05/2012 10:30 AM Beverley Fiedler, MD [facility] [phone] [facility]     Future Orders Please Complete By Expires   Diet - low sodium heart healthy      Discharge instructions      Comments:   -Arrange follow up with PCP in 2 weeks -Arrange follow up with GI specialist (Dr. Margretta Sidle) in 5-7 days -The majority of natural supplements that you are taking can be associated with GI symptoms and even liver side effects; give a trial of no other medications other than prescribed drugs for 2-3 weeks and  see how you feel. -Take medications as prescribed. -Keep yourself hydrated.       Medication List     As of 09/14/2012 11:11 AM    STOP taking these medications         GREEN TEA PO      PAPAYA ENZYME PO      potassium chloride SA 20 MEQ tablet   Commonly known as: K-DUR,KLOR-CON      saccharomyces boulardii 250 MG capsule   Commonly known as: FLORASTOR      VALERIAN ROOT PO      VANCOCIN HCL PO      TAKE these medications         Calcium-Magnesium 500-250 MG Tabs   Take 1 tablet by mouth daily.      carvedilol 80 MG 24 hr capsule   Commonly known as: COREG CR   Take 1  capsule (80 mg total) by mouth daily.      co-enzyme Q-10 30 MG capsule   Take 30 mg by mouth 3 (three) times daily.      colchicine 0.6 MG tablet   Take 0.6 mg by mouth daily as needed. For gout flareups.      digoxin 0.125 MG tablet   Commonly known as: LANOXIN   Take 0.125 mg by mouth every other day.      diphenhydrAMINE 25 mg capsule   Commonly known as: BENADRYL   Take 25 mg by mouth every 6 (six) hours as needed. For allergies.      famotidine 40 MG tablet   Commonly known as: PEPCID   Take 1 tablet (40 mg total) by mouth 2 (two) times daily.      fish oil-omega-3 fatty acids 1000 MG capsule   Take 2 g by mouth daily.      furosemide 40 MG tablet   Commonly known as: LASIX   Take 40 mg by mouth daily. For swelling.      lisinopril 20 MG tablet   Commonly known as: PRINIVIL,ZESTRIL   Take 20 mg by mouth daily.      multivitamin with minerals Tabs   Take 1 tablet by mouth daily.      nitroGLYCERIN 0.4 MG SL tablet   Commonly known as: NITROSTAT   Place 1 tablet (0.4 mg total) under the tongue every 5 (five) minutes as needed.      ondansetron 4 MG disintegrating tablet   Commonly known as: ZOFRAN-ODT   4mg  ODT q4 hours prn nausea/vomit      oxyCODONE-acetaminophen 5-325 MG per tablet   Commonly known as: PERCOCET/ROXICET   Take 1 tablet by mouth every 6 (six) hours as needed for pain.      traMADol 50 MG tablet   Commonly known as: ULTRAM   Take 50 mg by mouth every 6 (six) hours as needed. For pain.      vitamin C 500 MG tablet   Commonly known as: ASCORBIC ACID   Take 500 mg by mouth daily.      warfarin 5 MG tablet   Commonly known as: COUMADIN   Take 1-1.5 tablets (5-7.5 mg total) by mouth daily. 1 tab daily except 1.5 tabs daily on Mondays, Wednesdays and Fridays.    *Take 1.5 tablets today as well and resume prior to admission regimen.          The results of significant diagnostics from this hospitalization (including imaging,  microbiology, ancillary and laboratory) are listed below for reference.  Significant Diagnostic Studies: US Abdomen Complete  09/08/2012  *RADIOLOGY REPORT*  Clinical Data:  Right upper quadrant abdominal pain.  Evaluate for acute cholecystitis.  COMPLETE ABDOMINAL ULTRASOUND  Comparison:  CT of the abdomen and pelvis 09/02/2012.  Findings:  Gallbladder:  No shadowing gallstones or echogenic sludge.  No gallbladder wall thickening or pericholecystic fluid.  Negative sonographic Murphy's sign according to the ultrasound technologist.  Common bile duct:  Normal caliber measuring 6.4 mm in the porta hepatis.  Liver:  Normal size and echotexture without focal parenchymal abnormality.  Patent portal vein with hepatopetal flow.  IVC:  Patent throughout its visualized course in the abdomen.  Pancreas:  The head of the pancreas is unremarkable in appearance. The body and tail could not be visualized secondary to overlying bowel gas.  Spleen:  Normal size and echotexture without focal parenchymal abnormality.3.9 cm in length. The  Right Kidney:  No hydronephrosis.  Well-preserved cortex.  Normal size and parenchymal echotexture. 10.1 cm in length.  In the interpolar region and there is a 4.4 x 4.3 x 3.4 cm anechoic lesion with associated increased through transmission, compatible with a simple cyst.  An additional simple cyst is also noted in the lower pole measuring 2.9 x 2.7 x 3.4 cm.  Left Kidney:  No hydronephrosis.  Well-preserved cortex.  Normal size and parenchymal echotexture. 10.7 cm in length.  Simple cyst in the lower pole measuring 4.8 x 2.8 x 3.1 cm.  Abdominal aorta:  2.5 mm in diameter proximally and tapers appropriately distally.  IMPRESSION: 1.  No evidence of cholelithiasis or findings to suggest acute cholecystitis. 2.  Multiple simple cysts in the kidneys bilaterally, as above.   Original Report Authenticated By: Florencia Reasons, M.D.    Nm Hepato W/eject Fract  09/11/2012  *RADIOLOGY  REPORT*  Clinical Data:  Right upper quadrant pain.  NUCLEAR MEDICINE HEPATOBILIARY IMAGING WITH GALLBLADDER EF  Technique:  Sequential images of the abdomen were obtained out to 60 minutes following intravenous administration of radiopharmaceutical. After oral ingestion of Ensure, gallbladder ejection fraction was determined.  Radiopharmaceutical:  5.5 mCi Tc-64m Choletec  Comparison: Ultrasound 09/08/2012  Findings: There is uniform uptake radiotracer within the liver. Counts are excreted into the bowel by 35 minutes.  The gallbladder is visualized by 30 minutes.  Upon administration of the fatty meal, the gallbladder contracts appropriately with a calculated ejection fraction equal 59%.  Gallbladder ejection fraction:  59%. Normal gallbladder ejection fraction with Ensure is greater than 33%.  IMPRESSION:  1.  Normal gallbladder ejection fraction equals 59%. 2.  Patent cystic duct and common bile duct.   Original Report Authenticated By: Genevive Bi, M.D.    Dg Abd Acute W/chest  09/08/2012  *RADIOLOGY REPORT*  Clinical Data: Abdominal pain.  Nausea and vomiting. Diarrhea.  ACUTE ABDOMEN SERIES (ABDOMEN 2 VIEW & CHEST 1 VIEW)  Comparison: Multiple exams, including 09/01/2012 and 09/02/2012  Findings: Cardiomegaly noted.  AICD in place.  Blunting of both costophrenic angles compatible with small pleural effusions.  Mild linear airspace opacity in the left lower lobe.  Thoracic spondylosis noted.  No free intraperitoneal gas identified.  Gas and stool noted in the proximal colon.  Gas-filled loops of nondilated small bowel noted in the left abdomen. Tiny air fluid levels in the descending colon, in combination with the possible wall thickening in the descending and sigmoid colon on recent CT scan, may favor colitis.  IMPRESSION:  1.  Tiny locules of gas in the descending colon which otherwise  appears small in caliber.  I suspect distal colitis. 2.  Small bilateral pleural effusions, with mild atelectasis in  the left lower lobe. 3.  Cardiomegaly.   Original Report Authenticated By: Dellia Cloud, M.D.     Microbiology: Recent Results (from the past 240 hour(s))  CLOSTRIDIUM DIFFICILE BY PCR     Status: Normal   Collection Time   09/07/12  4:04 PM      Component Value Range Status Comment   C difficile by pcr Not Detected  Not Detected Final      Labs: Basic Metabolic Panel:  Lab 09/14/12 1478 09/13/12 0405 09/12/12 0410 09/11/12 0425 09/10/12 1616  NA 130* 127* 132* 131* 131*  K 3.9 4.1 4.1 4.2 4.2  CL 97 95* 100 97 95*  CO2 24 22 23 24 26   GLUCOSE 83 128* 88 101* 109*  BUN 15 15 16 17 20   CREATININE 1.24 1.10 1.18 1.39* 1.55*  CALCIUM 8.3* 8.4 8.4 8.8 8.8  MG -- -- -- -- --  PHOS -- -- -- -- --   Liver Function Tests:  Lab 09/13/12 0405 09/11/12 0425 09/10/12 1616 09/08/12 1516  AST 39* 77* 87* 150*  ALT 82* 120* 126* 140*  ALKPHOS 87 94 96 97  BILITOT 1.6* 1.8* 1.7* 3.1*  PROT 6.8 7.1 7.2 7.8  ALBUMIN 3.0* 3.2* 3.4* 3.5    Lab 09/10/12 1616 09/08/12 1516  LIPASE 22 18  AMYLASE -- --   CBC:  Lab 09/14/12 0430 09/11/12 0425 09/10/12 1616 09/08/12 1516  WBC 4.0 5.0 4.8 5.8  NEUTROABS -- -- 3.5 4.0  HGB 11.5* 11.3* 11.0* 12.1*  HCT 33.4* 32.7* 31.5* 34.9*  MCV 94.4 95.1 94.3 94.6  PLT 236 228 211 225    Signed:  Shamar Kracke  [facility] 09/14/2012, 11:11 AM

## 2012-09-15 ENCOUNTER — Telehealth: Payer: Self-pay | Admitting: Internal Medicine

## 2012-09-15 ENCOUNTER — Telehealth: Payer: Self-pay | Admitting: Cardiology

## 2012-09-15 ENCOUNTER — Ambulatory Visit: Payer: Medicare Other | Admitting: Gastroenterology

## 2012-09-15 DIAGNOSIS — R112 Nausea with vomiting, unspecified: Secondary | ICD-10-CM

## 2012-09-15 DIAGNOSIS — R109 Unspecified abdominal pain: Secondary | ICD-10-CM

## 2012-09-15 NOTE — Telephone Encounter (Signed)
Wife called requesting appointment per PCP.  Pt has been in the hospital for the treatment and evaluation of abdominal pain.  Wife reports he was given IV fluids too quickly which cause a problem with extreme edema (wt gain in 1 day 10lbs) and low NA.  PCP increased "fluid pill" and is having pt to have repeat blood work on Monday.  Pt scheduled for an appointment with Dr Antoine Poche.  He has an appointment with Sondra Come tomorrow for further evaluation as well.

## 2012-09-15 NOTE — Telephone Encounter (Signed)
Plz return call to patient wife at hm# or cell regarding questions regarding pt medical care.

## 2012-09-15 NOTE — Telephone Encounter (Signed)
Wife reports pt got home from the hospital last pm, tried to take something for pain and vomited it up; he has been vomiting on and off since then. I asked if he tolerated the po meds in the hospital. He saw his PCP this am who stated pt needs to see [place] this week. Discussed the situation with Dr Rhea Belton and Mike Gip, PA and Amy will see the pt tomorrow am and Dr Rhea Belton will see pt with her. Wife instructed to take pt for labs prior to the appt for CBC, BMET, Amylase and Lipase; wife stated understanding.

## 2012-09-16 ENCOUNTER — Inpatient Hospital Stay (HOSPITAL_COMMUNITY): Payer: Medicare Other

## 2012-09-16 ENCOUNTER — Encounter (HOSPITAL_COMMUNITY): Payer: Self-pay | Admitting: General Practice

## 2012-09-16 ENCOUNTER — Inpatient Hospital Stay (HOSPITAL_COMMUNITY)
Admission: AD | Admit: 2012-09-16 | Discharge: 2012-09-23 | Disposition: A | Discharge status: Home / Self Care | Payer: Medicare Other | Source: Ambulatory Visit | Attending: Internal Medicine | Admitting: Internal Medicine

## 2012-09-16 ENCOUNTER — Encounter: Payer: Self-pay | Admitting: Physician Assistant

## 2012-09-16 ENCOUNTER — Telehealth: Payer: Self-pay | Admitting: Internal Medicine

## 2012-09-16 ENCOUNTER — Ambulatory Visit (INDEPENDENT_AMBULATORY_CARE_PROVIDER_SITE_OTHER): Payer: Medicare Other | Admitting: Physician Assistant

## 2012-09-16 ENCOUNTER — Other Ambulatory Visit (INDEPENDENT_AMBULATORY_CARE_PROVIDER_SITE_OTHER): Payer: Medicare Other

## 2012-09-16 VITALS — BP 110/70 | HR 60 | Ht 68.0 in | Wt 177.2 lb

## 2012-09-16 DIAGNOSIS — N179 Acute kidney failure, unspecified: Secondary | ICD-10-CM | POA: Diagnosis present

## 2012-09-16 DIAGNOSIS — I2589 Other forms of chronic ischemic heart disease: Secondary | ICD-10-CM

## 2012-09-16 DIAGNOSIS — I4891 Unspecified atrial fibrillation: Secondary | ICD-10-CM | POA: Diagnosis present

## 2012-09-16 DIAGNOSIS — R52 Pain, unspecified: Secondary | ICD-10-CM

## 2012-09-16 DIAGNOSIS — I4892 Unspecified atrial flutter: Secondary | ICD-10-CM | POA: Diagnosis present

## 2012-09-16 DIAGNOSIS — I5023 Acute on chronic systolic (congestive) heart failure: Secondary | ICD-10-CM | POA: Diagnosis present

## 2012-09-16 DIAGNOSIS — E785 Hyperlipidemia, unspecified: Secondary | ICD-10-CM | POA: Diagnosis present

## 2012-09-16 DIAGNOSIS — I472 Ventricular tachycardia, unspecified: Secondary | ICD-10-CM | POA: Diagnosis present

## 2012-09-16 DIAGNOSIS — N189 Chronic kidney disease, unspecified: Secondary | ICD-10-CM | POA: Diagnosis present

## 2012-09-16 DIAGNOSIS — I255 Ischemic cardiomyopathy: Secondary | ICD-10-CM | POA: Diagnosis present

## 2012-09-16 DIAGNOSIS — Z7901 Long term (current) use of anticoagulants: Secondary | ICD-10-CM

## 2012-09-16 DIAGNOSIS — I4729 Other ventricular tachycardia: Secondary | ICD-10-CM | POA: Diagnosis present

## 2012-09-16 DIAGNOSIS — R112 Nausea with vomiting, unspecified: Secondary | ICD-10-CM

## 2012-09-16 DIAGNOSIS — I129 Hypertensive chronic kidney disease with stage 1 through stage 4 chronic kidney disease, or unspecified chronic kidney disease: Secondary | ICD-10-CM | POA: Diagnosis present

## 2012-09-16 DIAGNOSIS — Z95810 Presence of automatic (implantable) cardiac defibrillator: Secondary | ICD-10-CM

## 2012-09-16 DIAGNOSIS — I2510 Atherosclerotic heart disease of native coronary artery without angina pectoris: Secondary | ICD-10-CM | POA: Diagnosis present

## 2012-09-16 DIAGNOSIS — R109 Unspecified abdominal pain: Secondary | ICD-10-CM

## 2012-09-16 DIAGNOSIS — J4489 Other specified chronic obstructive pulmonary disease: Secondary | ICD-10-CM | POA: Diagnosis present

## 2012-09-16 DIAGNOSIS — J449 Chronic obstructive pulmonary disease, unspecified: Secondary | ICD-10-CM | POA: Diagnosis present

## 2012-09-16 DIAGNOSIS — I480 Paroxysmal atrial fibrillation: Secondary | ICD-10-CM | POA: Diagnosis present

## 2012-09-16 DIAGNOSIS — E871 Hypo-osmolality and hyponatremia: Secondary | ICD-10-CM | POA: Diagnosis present

## 2012-09-16 DIAGNOSIS — G40909 Epilepsy, unspecified, not intractable, without status epilepticus: Secondary | ICD-10-CM | POA: Diagnosis present

## 2012-09-16 DIAGNOSIS — R1084 Generalized abdominal pain: Secondary | ICD-10-CM

## 2012-09-16 DIAGNOSIS — I509 Heart failure, unspecified: Secondary | ICD-10-CM | POA: Diagnosis present

## 2012-09-16 DIAGNOSIS — I5022 Chronic systolic (congestive) heart failure: Secondary | ICD-10-CM

## 2012-09-16 DIAGNOSIS — K551 Chronic vascular disorders of intestine: Secondary | ICD-10-CM

## 2012-09-16 DIAGNOSIS — N259 Disorder resulting from impaired renal tubular function, unspecified: Secondary | ICD-10-CM | POA: Diagnosis present

## 2012-09-16 DIAGNOSIS — F411 Generalized anxiety disorder: Secondary | ICD-10-CM | POA: Diagnosis present

## 2012-09-16 DIAGNOSIS — R1011 Right upper quadrant pain: Secondary | ICD-10-CM

## 2012-09-16 HISTORY — DX: Headache: R51

## 2012-09-16 HISTORY — DX: Heart failure, unspecified: I50.9

## 2012-09-16 HISTORY — DX: Unspecified asthma, uncomplicated: J45.909

## 2012-09-16 HISTORY — DX: Chronic obstructive pulmonary disease, unspecified: J44.9

## 2012-09-16 LAB — BASIC METABOLIC PANEL
CO2: 30 mEq/L (ref 19–32)
Chloride: 95 mEq/L — ABNORMAL LOW (ref 96–112)
GFR: 44.96 mL/min — ABNORMAL LOW (ref >60.00)
Glucose, Bld: 106 mg/dL — ABNORMAL HIGH (ref 70–99)
Potassium: 4.5 mEq/L (ref 3.5–5.1)
Sodium: 131 mEq/L — ABNORMAL LOW (ref 135–145)

## 2012-09-16 LAB — CBC WITH DIFFERENTIAL/PLATELET
Basophils Relative: 1.2 % (ref 0.0–3.0)
Eosinophils Relative: 0.5 % (ref 0.0–5.0)
Lymphocytes Relative: 15 % (ref 12.0–46.0)
Monocytes Absolute: 0.6 10*3/uL (ref 0.1–1.0)
Monocytes Relative: 11.8 % (ref 3.0–12.0)
Neutrophils Relative %: 71.5 % (ref 43.0–77.0)
Platelets: 231 10*3/uL (ref 150.0–400.0)
RBC: 3.64 Mil/uL — ABNORMAL LOW (ref 4.22–5.81)
WBC: 4.9 10*3/uL (ref 4.5–10.5)

## 2012-09-16 LAB — APTT: aPTT: 38 seconds — ABNORMAL HIGH (ref 24–37)

## 2012-09-16 LAB — PROTIME-INR: Prothrombin Time: 20.5 seconds — ABNORMAL HIGH (ref 11.6–15.2)

## 2012-09-16 MED ORDER — ADULT MULTIVITAMIN W/MINERALS CH
1.0000 | ORAL_TABLET | Freq: Every day | ORAL | Status: DC
  Administered 2012-09-16 – 2012-09-23 (×8): 1 via ORAL
  Filled 2012-09-16 (×8): qty 1

## 2012-09-16 MED ORDER — DIGOXIN 125 MCG PO TABS
0.1250 mg | ORAL_TABLET | ORAL | Status: DC
  Filled 2012-09-16: qty 1

## 2012-09-16 MED ORDER — FUROSEMIDE 10 MG/ML IJ SOLN
40.0000 mg | Freq: Two times a day (BID) | INTRAMUSCULAR | Status: DC
  Administered 2012-09-16: 40 mg via INTRAVENOUS
  Filled 2012-09-16 (×4): qty 4

## 2012-09-16 MED ORDER — ACETAMINOPHEN 325 MG PO TABS: 650.0000 mg | ORAL_TABLET | ORAL | Status: DC | PRN

## 2012-09-16 MED ORDER — OXYCODONE-ACETAMINOPHEN 5-325 MG PO TABS
1.0000 | ORAL_TABLET | Freq: Four times a day (QID) | ORAL | Status: DC | PRN
  Administered 2012-09-17 – 2012-09-21 (×8): 1 via ORAL
  Filled 2012-09-16 (×8): qty 1

## 2012-09-16 MED ORDER — SODIUM CHLORIDE 0.9 % IV SOLN: 250.0000 mL | INTRAVENOUS | Status: DC | PRN

## 2012-09-16 MED ORDER — OMEGA-3-ACID ETHYL ESTERS 1 G PO CAPS
2.0000 g | ORAL_CAPSULE | Freq: Every day | ORAL | Status: DC
  Administered 2012-09-18 – 2012-09-23 (×5): 2 g via ORAL
  Filled 2012-09-16 (×8): qty 2

## 2012-09-16 MED ORDER — DIGOXIN 125 MCG PO TABS
0.1250 mg | ORAL_TABLET | ORAL | Status: DC
  Administered 2012-09-17 – 2012-09-22 (×3): 0.125 mg via ORAL
  Filled 2012-09-16 (×3): qty 1

## 2012-09-16 MED ORDER — SODIUM CHLORIDE 0.9 % IJ SOLN
3.0000 mL | Freq: Two times a day (BID) | INTRAMUSCULAR | Status: DC
  Administered 2012-09-16 – 2012-09-18 (×5): 3 mL via INTRAVENOUS
  Administered 2012-09-18: 10 mL via INTRAVENOUS
  Administered 2012-09-19 – 2012-09-22 (×8): 3 mL via INTRAVENOUS

## 2012-09-16 MED ORDER — VITAMIN C 500 MG PO TABS
500.0000 mg | ORAL_TABLET | Freq: Every day | ORAL | Status: DC
  Administered 2012-09-18 – 2012-09-23 (×5): 500 mg via ORAL
  Filled 2012-09-16 (×8): qty 1

## 2012-09-16 MED ORDER — POTASSIUM CHLORIDE ER 10 MEQ PO TBCR
10.0000 meq | EXTENDED_RELEASE_TABLET | Freq: Every day | ORAL | Status: DC
  Administered 2012-09-17: 10 meq via ORAL
  Filled 2012-09-16 (×2): qty 1

## 2012-09-16 MED ORDER — NITROGLYCERIN 0.4 MG SL SUBL: 0.4000 mg | SUBLINGUAL_TABLET | SUBLINGUAL | Status: DC | PRN

## 2012-09-16 MED ORDER — FAMOTIDINE 40 MG PO TABS
40.0000 mg | ORAL_TABLET | Freq: Two times a day (BID) | ORAL | Status: DC
  Administered 2012-09-16 – 2012-09-23 (×14): 40 mg via ORAL
  Filled 2012-09-16 (×15): qty 1

## 2012-09-16 MED ORDER — CALCIUM CARBONATE 1250 (500 CA) MG PO TABS
1.0000 | ORAL_TABLET | Freq: Every day | ORAL | Status: DC
  Administered 2012-09-16 – 2012-09-23 (×7): 500 mg via ORAL
  Filled 2012-09-16 (×8): qty 1

## 2012-09-16 MED ORDER — CARVEDILOL PHOSPHATE ER 80 MG PO CP24
80.0000 mg | ORAL_CAPSULE | Freq: Every day | ORAL | Status: DC
  Administered 2012-09-17: 80 mg via ORAL
  Filled 2012-09-16 (×3): qty 1

## 2012-09-16 MED ORDER — COENZYME Q10 30 MG PO CAPS: 30.0000 mg | ORAL_CAPSULE | Freq: Three times a day (TID) | ORAL | Status: DC

## 2012-09-16 MED ORDER — CALCIUM-MAGNESIUM 500-250 MG PO TABS: 1.0000 | ORAL_TABLET | Freq: Every day | ORAL | Status: DC

## 2012-09-16 MED ORDER — SODIUM CHLORIDE 0.9 % IJ SOLN: 3.0000 mL | INTRAMUSCULAR | Status: DC | PRN

## 2012-09-16 MED ORDER — ONDANSETRON 4 MG PO TBDP
4.0000 mg | ORAL_TABLET | ORAL | Status: DC | PRN
  Administered 2012-09-16 – 2012-09-17 (×3): 4 mg via ORAL
  Filled 2012-09-16 (×3): qty 1

## 2012-09-16 MED ORDER — MAGNESIUM OXIDE 400 (241.3 MG) MG PO TABS
400.0000 mg | ORAL_TABLET | Freq: Every day | ORAL | Status: DC
  Administered 2012-09-16 – 2012-09-23 (×7): 400 mg via ORAL
  Filled 2012-09-16 (×8): qty 1

## 2012-09-16 MED ORDER — LISINOPRIL 20 MG PO TABS
20.0000 mg | ORAL_TABLET | Freq: Every day | ORAL | Status: DC
  Administered 2012-09-16 – 2012-09-17 (×2): 20 mg via ORAL
  Filled 2012-09-16 (×3): qty 1

## 2012-09-16 MED ORDER — TRAMADOL HCL 50 MG PO TABS
50.0000 mg | ORAL_TABLET | Freq: Four times a day (QID) | ORAL | Status: DC | PRN
  Administered 2012-09-16 – 2012-09-23 (×3): 50 mg via ORAL
  Filled 2012-09-16 (×4): qty 1

## 2012-09-16 NOTE — Progress Notes (Signed)
Pt scheduled for CxR and instructed by Ronny Flurry, PA nurse to go with pt.  Informed charge nurse Tai.  Will also inform incoming nurse.  Amanda Pea, RN>

## 2012-09-16 NOTE — H&P (Signed)
History and Physical  Patient ID: [name] MRN: [id number], SOB: [birth year] 66 y.o. Date of Encounter: 09/16/2012, 1:31 PM  Primary Physician: Crist Fat, MD Primary Cardiologist: Dr. Antoine Poche Primary Electrophysiologist: Dr. Graciela Husbands  Chief Complaint: abdominal pain  HPI: 66 y.o. male w/ PMHx significant for CAD (s/p DES to LCx '03, residual total occlusion of RCA w/ collaterals, normal myoview 2012), ICM (EF 25%, s/p Medtronic BiV ICD), Atrial Fib/Flutter (s/p DCCV, on coumadin), HTN, HLD, CKD, hyponatremia, and Seizure disorder who presented to [facility] on 09/16/2012 with complaints of abdominal discomfort.  Myoview 2012 showed evidence of inferior and infero-lateral scar with no significant ischemia, EF 28%. Echo 05/2012 showed severely dilated LV, EF 25%, diffuse hypokinesis, posterior basal akinesis, mild MR/AI, mod LAE, mild RAE. In August he c/o scapular pain and was concerned it was his angina. Dr. Antoine Poche did not feel it was cardiac, but felt it was reasonable to schedule a Myoview. This was scheduled, but due to other ongoing GI medical issues he postponed the test. He reports feeling chest congestion in August for which he had a CXR that showed "fluid on the lungs" and was treated with a Zpak. He then developed diarrhea and was treated with multiple medications for this. C.Diff was negative. He then developed abdominal pain, nausea, and vomiting. CTA abd at [facility] 10/17 was without acute findings, but did show aortic atherosclerosis and mild hepatic fatty infiltration. He was hospitalized 10/25-29 for abdominal pain and diarrhea at which time HIDA scan and EGD were normal and he was to follow up with GI as an outpatient. He was seen in GI clinic today where he continued to complain of abdominal pain, anorexia, vomiting, diarrhea, and LE edema. Per GI note there was concern he had low output heart failure leading to mesenteric ischemia for which he was sent to  the hospital for further evaluation. He reports having BLE edema since he was "given too much fluid" in the hospital. The swelling is worse on the right. He denies chest pain, sob, doe, orthopnea, PND, cough, syncope, fever, chills, melena, hematochezia, hematemesis. Continues to complain of diffuse abdominal pain that is sometimes sharp and moves all over. Continues to have nausea and vomiting. His weight has varied since August but has stayed fairly stable around 165-169lbs. He has been compliant with his medications, including coumadin and lasix. Was instructed to double his dose for two days recently.  EKG is pending. CXR is pending. Labs are significant for Na 131, Crt 1.6, Hgb 11.9.   Past Medical History  Diagnosis Date  . Ischemic cardiomyopathy     Myoview 02/2011  EF 28%  Coronary artery disease  (total occlusion of the right coronary artery, left-to-right )  . Atrial flutter     s/p TEE guided cardioversion  . Nonsustained ventricular tachycardia   . LBBB (left bundle branch block)   . Chronic drug-induced interstitial lung disorders   . Acute on chronic renal insufficiency   . Coronary artery disease     total occlusion of the right coronary artery, left to right collaterals.  He had Cypher stenting to the circumflex in  July 2003  . Seizure disorder   . AICD (automatic cardioverter/defibrillator) present     Medtronic CRT  . Hyponatremia   . 6949-lead     replaced 02/2011  . Hyperlipemia   . Hypertension   . Fatty liver     CT  . Family hx of colon cancer  2 SISTERS    09/11/2012 - NUCLEAR MEDICINE HEPATOBILIARY IMAGING WITH GALLBLADDER EF Findings: There is uniform uptake radiotracer within the liver. Counts are excreted into the bowel by 35 minutes.  The gallbladder is visualized by 30 minutes.  Upon administration of the fatty meal, the gallbladder contracts appropriately with a calculated ejection fraction equal 59%.  Gallbladder ejection fraction:  59%. Normal  gallbladder ejection fraction with Ensure is greater than 33%.  IMPRESSION:  1.  Normal gallbladder ejection fraction equals 59%. 2.  Patent cystic duct and common bile duct.    09/08/12 - Abd US Findings: Gallbladder: No shadowing gallstones or echogenic sludge. No gallbladder wall thickening or pericholecystic fluid. Negative sonographic Murphy's sign according to the ultrasound technologist. Common bile duct: Normal caliber measuring 6.4 mm in the portahepatis. Liver: Normal size and echotexture without focal parenchymal abnormality. Patent portal vein with hepatopetal flow. IVC: Patent throughout its visualized course in the abdomen. Pancreas: The head of the pancreas is unremarkable in appearance. The body and tail could not be isualized secondary to overlying bowel gas. Spleen: Normal size and echotexture without focal parenchymal abnormality.3.9 cm in length.  Right Kidney: No hydronephrosis. Well-preserved cortex. Normal size and parenchymal echotexture. 10.1 cm in length. In the interpolar region and there is a 4.4 x 4.3 x 3.4 cm anechoic lesion with associated increased through transmission, compatible with a simple cyst. An additional simple cyst is also noted in the lower pole measuring 2.9 x 2.7 x 3.4 cm.  Left Kidney: No hydronephrosis. Well-preserved cortex. Normal size and parenchymal echotexture. 10.7 cm in length. Simple cyst in the lower pole measuring 4.8 x 2.8 x 3.1 cm.  Abdominal aorta: 2.5 mm in diameter proximally and tapers appropriately distally.  IMPRESSION: 1. No evidence of cholelithiasis or findings to suggest acute cholecystitis. 2. Multiple simple cysts in the kidneys bilaterally, as above.  05/2012 - Echo Study Conclusions: - Left ventricle: Posterior basal akinesis The cavity size was severely dilated. Wall thickness was normal. The estimated ejection fraction was 25%. Diffuse hypokinesis.  - Aortic valve: Mild regurgitation. - Mitral valve: Mild  regurgitation. - Left atrium: The atrium was moderately dilated. - Right atrium: The atrium was mildly dilated. - Atrial septum: No defect or patent foramen ovale was identified. - Pulmonary arteries: PA peak pressure: 34mm Hg (S).  03/03/11 - Myoview Quantitative Gated Spect Images  QGS EDV: 219 ml  QGS ESV: 158 ml  QGS cine images: Diffuse hypokinesis  QGS EF: 28%  Impression  Exercise Capacity: Adenosine study with no exercise.  BP Response: Normal blood pressure response.  Clinical Symptoms: Chest pressure  ECG Impression: No significant ST segment change suggestive of ischemia.  Comparison with Prior Nuclear Study: No images to compare  Overall Impression: Evidence of inferior and infero-lateral scar with no significant ischemia. LV dysfunction.   Surgical History:  Past Surgical History  Procedure Date  . Insert / replace / remove pacemaker     AICD medtronic  . Hernia repair   . Esophagogastroduodenoscopy 09/13/2012    Procedure: ESOPHAGOGASTRODUODENOSCOPY (EGD);  Surgeon: Meryl Dare, MD,FACG;  Location: [facility];  Service: Endoscopy;  Laterality: N/A;     Home Meds: Prior to Admission medications   Medication Sig  Calcium-Magnesium 500-250 MG TABS Take 1 tablet by mouth daily.   carvedilol (COREG CR) 80 MG 24 hr capsule Take 1 capsule (80 mg total) by mouth daily.  co-enzyme Q-10 30 MG capsule Take 30 mg by mouth 3 (three) times daily.  colchicine 0.6 MG tablet Take 0.6 mg by mouth daily as needed. For gout flareups.  digoxin (LANOXIN) 0.125 MG tablet Take 0.125 mg by mouth every other day.  diphenhydrAMINE (BENADRYL) 25 mg capsule Take 25 mg by mouth every 6 (six) hours as needed. For allergies.  famotidine (PEPCID) 40 MG tablet Take 1 tablet (40 mg total) by mouth 2 (two) times daily.  fish oil-omega-3 fatty acids 1000 MG capsule Take 2 g by mouth daily.  furosemide (LASIX) 40 MG tablet Take 40 mg by mouth 2 (two) times daily. For swelling, starting 10/30  take one twice a day for 2 days per PCP  lisinopril (PRINIVIL,ZESTRIL) 20 MG tablet Take 20 mg by mouth daily.  Multiple Vitamin (MULTIVITAMIN WITH MINERALS) TABS Take 1 tablet by mouth daily.  nitroGLYCERIN (NITROSTAT) 0.4 MG SL tablet Place 0.4 mg under the tongue every 5 (five) minutes as needed. For chest pain  ondansetron (ZOFRAN-ODT) 4 MG disintegrating tablet Take 4 mg by mouth every 4 (four) hours as needed. For nausea  oxyCODONE-acetaminophen (PERCOCET/ROXICET) 5-325 MG per tablet Take 1 tablet by mouth every 6 (six) hours as needed. For pain  potassium chloride (K-DUR) 10 MEQ tablet Take 10 mEq by mouth daily.  traMADol (ULTRAM) 50 MG tablet Take 50 mg by mouth every 6 (six) hours as needed. For pain.  vitamin C (ASCORBIC ACID) 500 MG tablet Take 500 mg by mouth daily.  warfarin (COUMADIN) 5 MG tablet Take 1-1.5 tablets (5-7.5 mg total) by mouth daily. 1 tab daily except 1.5 tabs daily on Mondays, Wednesdays and Fridays.  *Take 1.5 tablets today as well and resume prior to admission regimen.    Allergies:  Allergies  Allergen Reactions  . Ace Inhibitors   . Amlodipine Besylate   . Codeine   . Corzide (Nadolol-Bendroflumethiazide)   . Diovan (Valsartan)   . Flagyl (Metronidazole)   . Latex   . Norvasc (Amlodipine Besylate)   . Omeprazole   . Penicillins     History   Social History  . Marital Status: Married    Spouse Name: N/A    Number of Children: 1  . Years of Education: N/A   Occupational History  . RETIRED    Social History Main Topics  . Smoking status: Never Smoker   . Smokeless tobacco: Never Used  . Alcohol Use: 0.0 oz/week     OCCASIONALLY NOT OFTEN  . Drug Use: No  . Sexually Active: Not on file   Other Topics Concern  . Not on file   Social History Narrative  . No narrative on file     Family History  Problem Relation Age of Onset  . Heart disease Mother   . Heart disease Father   . Colon cancer Sister     BOTH SISTERS 1 DESEASED FROM  COLON CANCER  . Colon cancer Sister     Review of Systems: General: negative for chills, fever, night sweats or weight changes.  Cardiovascular: As per HPI Dermatological: negative for rash Respiratory: negative for cough or wheezing Urologic: negative for hematuria Abdominal: As per HPI Neurologic: negative for visual changes, syncope, or dizziness All other systems reviewed and are otherwise negative except as noted above.  Labs:   Component Value Date   WBC 4.9 09/16/2012   HGB 11.9* 09/16/2012   HCT 36.3* 09/16/2012   MCV 99.7 09/16/2012   PLT 231.0 09/16/2012    Lab 09/16/12 0905 09/13/12 0405  NA 131* --  K 4.5 --  CL  95* --  CO2 30 --  BUN 18 --  CREATININE 1.6* --  CALCIUM 8.9 --  PROT -- 6.8  BILITOT -- 1.6*  ALKPHOS -- 87  ALT -- 82*  AST -- 39*  GLUCOSE 106* --    Radiology/Studies:  CXR pending   EKG: pending Tele: V-paced, underlying a.fib 80s, PVCs  Physical Exam: Blood pressure 128/85, pulse 59, temperature 97.5 F (36.4 C), temperature source Oral, resp. rate 18, height 5\' 8"  (1.727 m), weight 169 lb 5 oz (76.8 kg), SpO2 98.00%. General: Chronically ill appearing, pale elderly white male in no acute distress. Head: Normocephalic, atraumatic, sclera non-icteric, nares are without discharge Neck: Supple. Negative for carotid bruits or JVD Lungs: Clear bilaterally to auscultation without wheezes, rales, or rhonchi. Breathing is unlabored. Heart: Irregularly irregular with S1 S2. No murmurs, rubs, or gallops appreciated. Abdomen: Soft, diffusely tender, non-distended with normoactive bowel sounds. No rebound/guarding. No obvious abdominal masses. Msk:  Strength and tone appear normal for age. Extremities: 2+ BLE pitting edema R>L. No clubbing or cyanosis. Distal pedal pulses are intact and equal bilaterally. Neuro: Alert and oriented X 3. Moves all extremities spontaneously. Psych:  Responds to questions appropriately with a normal affect.     ASSESSMENT AND PLAN:  66 y.o. male w/ PMHx significant for CAD (s/p DES to LCx '03, residual total occlusion of RCA w/ collaterals, normal myoview 2012), ICM (EF 25%, s/p Medtronic BiV ICD), Atrial Fib/Flutter (s/p DCCV, on coumadin), HTN, HLD, CKD, hyponatremia, and Seizure disorder who presented to [facility] on 09/16/2012 with complaints of abdominal discomfort.  1. Abdominal pain concerning for mesenteric ischemia 2. Volume overload w/ suspected acute on chronic Systolic CHF 3. Ischemic cardiomyopathy, EF 25% s/p BiV ICD 4. A.fib/flutter on chronic coumadin 5. Acute on Chronic Renal Failure 6. Hyponatremia 7. Coronary Artery Disease 8. Hypertension 9. Hyperlipidemia  See MD note below for full assessment and plan.    Signed, HOPE, JESSICA [name] 09/16/2012, 1:31 PM  As above, patient seen and examined. Briefly patient is a 66 year old male with past medical history of coronary artery disease, ischemic cardio myopathy, permanent atrial fibrillation, prior ICD, hypertension, hyperlipidemia and renal insufficiency admitted with abdominal pain question low output syndrome. Patient has had significant abdominal pain for approximately 3 months. He has had an extensive workup including negative gallbladder ultrasound, negative HIDA scan, CT of the abdomen revealing mild inflammation or mesenteric edema and atherosclerosis of the aorta and iliacs. He also had a negative EGD. During recent hospitalization patient had presented with probable dehydration. He received 2 L of IV fluid by report. He subsequently developed pedal edema. He was seen by gastroenterology today and there was question as to whether patient's course is related to low cardiac output causing mesenteric ischemia. The patient has mild dyspnea on exertion which is chronic and unchanged. He has chronic orthopnea which is unchanged. He denies chest pain but has some occasional right back pain. He has had nausea and diffuse  abdominal pain that is intermittent. He has never had edema until he was hydrated. Etiology of patient's symptoms is unclear to me. He is volume overloaded on examination with 2+ pedal edema. Will diurese with Lasix 40 mg IV twice a day and follow renal function. Hold Coumadin. Once his renal function is stable and his CHF improves we'll consider right heart catheterization to evaluate cardiac output and to exclude low output syndrome. I have asked vascular surgery to evaluate the patient for their opinion concerning mesenteric ischemia. His  presentation certainly is not classic. We will consider a CTA of his abdomen later if his renal function remained stable with diuresis. Continue remaining medications. I do not think he needs inotropes at this point. Olga Millers 3:27 PM

## 2012-09-16 NOTE — Consult Note (Signed)
[facility] [place]  Referred by:  Dr. Olga Millers  Reason for referral: evaluate for mesenteric ischemia  History of Present Illness  [name] is a 66 y.o. (1945-11-27) male who presents with chief complaint: abdominal pain.  Patient notes ~3 month history of abdominal pain.  Pain has variable character: occasionally stabbing sensation, variable location: today epigastric.  Pain is 3-10/10, associated with nausea and vomitting and loose stools.  He notes aggravation with laying flat, forcing him to sit up to sleep at times.  The patient notes no association of pain with eating.  He notes ~15 lb weight loss over the last 3 months.  He denies any hematochezia, melena, or hematemesis.  Patient's atherosclerotic risk factors included: hyperlipidemia and HTN.  He has significant cardiac disease, as documented extensively in Dr. Ludwig Clarks note.  He was transferred from [facility] with a possible diagnosis of NOMI.  Past Medical History  Diagnosis Date  . Ischemic cardiomyopathy     Myoview 02/2011  EF 28%  Coronary artery disease  (total occlusion of the right coronary artery, left-to-right )  . Atrial flutter     s/p TEE guided cardioversion  . Nonsustained ventricular tachycardia   . LBBB (left bundle branch block)   . Chronic drug-induced interstitial lung disorders   . Acute on chronic renal insufficiency   . Coronary artery disease     total occlusion of the right coronary artery, left to right collaterals.  He had Cypher stenting to the circumflex in  July 2003  . Seizure disorder   . AICD (automatic cardioverter/defibrillator) present     Medtronic CRT  . Hyponatremia   . 6949-lead     replaced 02/2011  . Hyperlipemia   . Hypertension   . Fatty liver     CT  . Family hx of colon cancer     2 SISTERS    Past Surgical History  Procedure Date  . Insert / replace / remove pacemaker     AICD medtronic  . Hernia repair   .  Esophagogastroduodenoscopy 09/13/2012    Procedure: ESOPHAGOGASTRODUODENOSCOPY (EGD);  Surgeon: Meryl Dare, MD,FACG;  Location: [facility];  Service: Endoscopy;  Laterality: N/A;    History   Social History  . Marital Status: Married    Spouse Name: N/A    Number of Children: 1  . Years of Education: N/A   Occupational History  . RETIRED    Social History Main Topics  . Smoking status: Never Smoker   . Smokeless tobacco: Never Used  . Alcohol Use: 0.0 oz/week     OCCASIONALLY NOT OFTEN  . Drug Use: No  . Sexually Active: Not on file   Other Topics Concern  . Not on file   Social History Narrative  . No narrative on file    Family History  Problem Relation Age of Onset  . Heart disease Mother   . Heart disease Father   . Colon cancer Sister     BOTH SISTERS 1 DESEASED FROM COLON CANCER  . Colon cancer Sister     No current facility-administered medications on file prior to encounter.   Current Outpatient Prescriptions on File Prior to Encounter  Medication Sig Dispense Refill  . Calcium-Magnesium 500-250 MG TABS Take 1 tablet by mouth daily.       . carvedilol (COREG CR) 80 MG 24 hr capsule Take 1 capsule (80 mg total) by mouth daily.  30 capsule  9  .  co-enzyme Q-10 30 MG capsule Take 30 mg by mouth 3 (three) times daily.        . colchicine 0.6 MG tablet Take 0.6 mg by mouth daily as needed. For gout flareups.      . digoxin (LANOXIN) 0.125 MG tablet Take 0.125 mg by mouth every other day.      . diphenhydrAMINE (BENADRYL) 25 mg capsule Take 25 mg by mouth every 6 (six) hours as needed. For allergies.      . famotidine (PEPCID) 40 MG tablet Take 1 tablet (40 mg total) by mouth 2 (two) times daily.  60 tablet  1  . fish oil-omega-3 fatty acids 1000 MG capsule Take 2 g by mouth daily.      . furosemide (LASIX) 40 MG tablet Take 40 mg by mouth 2 (two) times daily. For swelling, starting 10/30 take one twice a day for 2 days per PCP      . lisinopril  (PRINIVIL,ZESTRIL) 20 MG tablet Take 20 mg by mouth daily.      . Multiple Vitamin (MULTIVITAMIN WITH MINERALS) TABS Take 1 tablet by mouth daily.      . potassium chloride (K-DUR) 10 MEQ tablet Take 10 mEq by mouth daily.      . traMADol (ULTRAM) 50 MG tablet Take 50 mg by mouth every 6 (six) hours as needed. For pain.      . vitamin C (ASCORBIC ACID) 500 MG tablet Take 500 mg by mouth daily.      Marland Kitchen warfarin (COUMADIN) 5 MG tablet Take 1-1.5 tablets (5-7.5 mg total) by mouth daily. 1 tab daily except 1.5 tabs daily on Mondays, Wednesdays and Fridays.  *Take 1.5 tablets today as well and resume prior to admission regimen.      Marland Kitchen DISCONTD: nitroGLYCERIN (NITROSTAT) 0.4 MG SL tablet Place 1 tablet (0.4 mg total) under the tongue every 5 (five) minutes as needed.  25 tablet  3    Allergies  Allergen Reactions  . Ace Inhibitors   . Amlodipine Besylate     Numbness to lips; made legs give out  . Codeine Itching  . Corzide (Nadolol-Bendroflumethiazide)   . Diovan (Valsartan)   . Flagyl (Metronidazole)   . Norvasc (Amlodipine Besylate)     Numbness to lips; made legs give out  . Omeprazole     Shut my kidneys down  . Other     Perfumes smells; paint smells; formaldehyde smells  . Penicillins   . Shellfish Allergy   . Latex Itching and Rash    REVIEW OF SYSTEMS:  (Positives checked otherwise negative)  CARDIOVASCULAR:  [ ]  chest pain, [ ]  chest pressure, [ ]  palpitations, [ ]  shortness of breath when laying flat, [x]  shortness of breath with exertion, [ ]  pain in feet when laying flat, [ ]  history of blood clot in veins (DVT), [ ]  history of phlebitis, [ ]  swelling in legs, [ ]  varicose veins  PULMONARY:  [ ]  productive cough, [ ]  asthma, [ ]  wheezing  NEUROLOGIC:  [ ]  weakness in arms or legs, [ ]  numbness in arms or legs, [ ]  difficulty speaking or slurred speech, [ ]  temporary loss of vision in one eye, [ ]  dizziness  HEMATOLOGIC:  [ ]  bleeding problems, [ ]  problems with blood  clotting too easily  MUSCULOSKEL:  [ ]  joint pain, [ ]  joint swelling  GASTROINTEST:  [ ]   Vomiting blood, [ ]   Blood in stool , [x]  see above  GENITOURINARY:  [ ]   Burning with urination, [ ]   Blood in urine, [x]  CKD  PSYCHIATRIC:  [ ]  history of major depression  INTEGUMENTARY:  [ ]  rashes, [ ]  ulcers  CONSTITUTIONAL:  [ ]  fever, [ ]  chills  Physical Examination  Filed Vitals:   09/16/12 1322 09/16/12 1445  BP: 128/85 137/95  Pulse: 59 74  Temp: 97.5 F (36.4 C) 97.5 F (36.4 C)  TempSrc: Oral Oral  Resp: 18 18  Height: 5\' 8"  (1.727 m)   Weight: 169 lb 5 oz (76.8 kg)   SpO2: 98% 97%   Body mass index is 25.74 kg/(m^2).  General: A&O x 3, WDWN  Head: [place]/AT  Ear/Nose/Throat: Hearing grossly intact, nares w/o erythema or drainage, oropharynx w/o Erythema/Exudate  Eyes: PERRLA, EOMI  Neck: Supple, no nuchal rigidity, no palpable LAD  Pulmonary: Sym exp, good air movt, CTAB, no rales, rhonchi, & wheezing  Cardiac: RRR, Nl S1, S2, no Murmurs, rubs or gallops  Vascular: Vessel Right Left  Radial Weakly Palpable Weakly Palpable  Ulnar Weakly Palpable Weakly Palpable  Brachial Palpable Palpable  Carotid Palpable, without bruit Palpable, without bruit  Aorta Not palpable N/A  Femoral Palpable Palpable  Popliteal Palpable Palpable  PT Palpable Palpable  DP Palpable Palpable   Gastrointestinal: soft, ND, mild epigastric TTP, -G/R, - HSM, - masses, - CVAT B, no palpable midline mass  Musculoskeletal: M/S 5/5 throughout , Extremities without ischemic changes   Neurologic: CN 2-12 intact , Pain and light touch intact in extremities , Motor exam as listed above  Psychiatric: Judgment intact, Mood & affect appropriate for pt's clinical situation  Dermatologic: See M/S exam for extremity exam, no rashes otherwise noted  Lymph : No Cervical, Axillary, or Inguinal lymphadenopathy   Laboratory: CBC:    Component Value Date/Time   WBC 4.9 09/16/2012 0905   RBC  3.64* 09/16/2012 0905   HGB 11.9* 09/16/2012 0905   HCT 36.3* 09/16/2012 0905   PLT 231.0 09/16/2012 0905   MCV 99.7 09/16/2012 0905   MCH 32.5 09/14/2012 0430   MCHC 32.8 09/16/2012 0905   RDW 16.0* 09/16/2012 0905   LYMPHSABS 0.7 09/16/2012 0905   MONOABS 0.6 09/16/2012 0905   EOSABS 0.0 09/16/2012 0905   BASOSABS 0.1 09/16/2012 0905    BMP:    Component Value Date/Time   NA 131* 09/16/2012 0905   K 4.5 09/16/2012 0905   CL 95* 09/16/2012 0905   CO2 30 09/16/2012 0905   GLUCOSE 106* 09/16/2012 0905   BUN 18 09/16/2012 0905   CREATININE 1.6* 09/16/2012 0905   CALCIUM 8.9 09/16/2012 0905   GFRNONAA 58* 09/14/2012 0430   GFRAA 68* 09/14/2012 0430    Coagulation: Lab Results  Component Value Date   INR 1.63* 09/14/2012   INR 1.84* 09/13/2012   INR 2.10* 09/12/2012   No results found for this basename: PTT   LFT: AST 39, ALT 82, TProt 6.8, Tbil 1.6, AlkPhos 87  Lipase 22 Amylase 39  Outside Studies/Documentation 6 pages of outside documents were reviewed including: outside hospital CT report.    Medical Decision Making  KHAMARI SHEEHAN is a 66 y.o. male who presents with: abdominal pain, chronic cardiomyopathy, and possible mesenteric atherosclerosis.   Patient's presentation is not consistent with classic chronic mesenteric ischemia with post-prandial pain or food fear.    Also his presentation is not suggestive of non-occlusive mesenteric ischemia.  At this point, some degree of ARI is present with increase in Cr, likely due  to some degree of dehydration for recent increase in diarrhea.  Once his renal function stabilizes, CTA Abd/Pelvis may better interrogate the mesenteric vessels without the risks associated with a mesenteric angiogram.    Another consideration is a CT with venous phase timing to consider mesenteric vein thrombosis, which can be missed on a CT without special allowance for such.  We will follow up on the patient after the CTA is  completed.  Thank you for allowing [place] to participate in this patient's care.  Leonides Sake, MD [facility] [place] Office: [phone] Pager: [phone]  09/16/2012, 3:31 PM

## 2012-09-16 NOTE — Progress Notes (Signed)
Monitor tech notified RN that pt had 8 beat run of Vtach, pt asymptomatic, BP 126/74, HR 62. MD notified and no new orders given. Will continue to monitor and assess. Jobe Igo A RN, 09/16/2012

## 2012-09-16 NOTE — Telephone Encounter (Signed)
Forward 16 pages from [facility] to Dr. Erick Blinks for review on 09-15-12 ym

## 2012-09-16 NOTE — Progress Notes (Addendum)
Subjective:    Patient ID: [name], male    DOB: 14-Jul-1946, 66 y.o.   MRN: [id number]  HPI [name] is very nice 66 year old white male, new to GI when seen 09/08/2012 for the first time. He has multiple serious medical problems primarily involving a severe ischemic cardiomyopathy with an EF of approximately 25% 1 last checked area appendectomy is known to Dr. Antoine Poche, has an AICD in place as also maintained on chronic Coumadin, with atrial atrial fib/ flutter. He has history of chronic renal insufficiency as well. When he was initially seen on 10/23 he was complaining of new onset of lower abdominal pain, cramping and diarrhea with symptoms for approximately 2 months somewhat progressive . He related onset of his symptoms to taking a Z-Pak for an upper respiratory infection and started having diarrhea about a week later. He had CT scan of the abdomen and pelvis done on 09/02/2012 with IV contrast and this showed cardiomegaly, bilateral small pleural effusions right greater than left and athero sclerotic calcifications of the aorta and iliac arteries as well as a contracted gallbladder and a small amount of free pelvic fluid. At that time he was having 10-12 bowel movements per day and it was felt that he may have C. difficile colitis, versus underlying new onset of mesenteric insufficiency.  He was started empirically on a course of vancomycin and C. difficile PCR was ordered. Within 24 hours of that visit he was feeling worse generally with weakness and went to the emergency room was given IV fluid hydration, and admitted. He had gone having problems with nausea and vomiting as well . Interestingly his diarrhea resolved his primary complaints became abdominal pain nausea and anorexia. GI was consulted and HIDA scan was done which was negative. He also had upper endoscopy by Dr. Russella Dar on 09/13/2012 and this was normal. C. difficile PCR was negative He was noted to be hyponatremic on admission as well  with serum sodium of 123, and had a bump in his creatinine. His diuretics were increased, serum sodium was corrected to 130, and he was discharged from the hospital on 09/14/2012 . There was no clear explanation for his abdominal pain nausea and vomiting though a diagnosis of mesenteric insufficiency was felt likely. Today he comes back to the office one day later ,no better and in fact feeling worse overall. He is unable to eat much of anything and says that his abdominal pain does seem to be worse at this point eventually after eating and he has been vomiting intermittently. He is says he is having constant abdominal pain is fairly generalized and rates at least a 6, but at times will be more severe and sharp. He does feel that the pain is exacerbated by eating. He says he's also been sitting up at night because he feels more pressure and discomfort in his abdomen with lying down and feels short of breath. When asked about chest pain he denies this- but he has been having some pain in his back primarily in the right upper back and his wife states this is similar to his prior anginal symptoms. He is currently having one to 2 loose bowel movements per day which are nonbloody. He is also developed bilateral lower extremity  edema over the past week which she has never had before    Review of Systems  Constitutional: Positive for activity change, appetite change and fatigue.  HENT: Negative.   Eyes: Negative.   Respiratory: Positive for shortness of breath.  Cardiovascular: Positive for leg swelling.  Gastrointestinal: Positive for nausea, vomiting and abdominal pain.  Genitourinary: Negative.   Musculoskeletal: Positive for back pain.  Skin: Negative.   Neurological: Positive for weakness.  Hematological: Negative.   Psychiatric/Behavioral: Negative.    Outpatient Prescriptions Prior to Visit  Medication Sig Dispense Refill  . Calcium-Magnesium 500-250 MG TABS Take 1 tablet by mouth daily.        . carvedilol (COREG CR) 80 MG 24 hr capsule Take 1 capsule (80 mg total) by mouth daily.  30 capsule  9  . co-enzyme Q-10 30 MG capsule Take 30 mg by mouth 3 (three) times daily.        . colchicine 0.6 MG tablet Take 0.6 mg by mouth daily as needed. For gout flareups.      . digoxin (LANOXIN) 0.125 MG tablet Take 0.125 mg by mouth every other day.      . diphenhydrAMINE (BENADRYL) 25 mg capsule Take 25 mg by mouth every 6 (six) hours as needed. For allergies.      . famotidine (PEPCID) 40 MG tablet Take 1 tablet (40 mg total) by mouth 2 (two) times daily.  60 tablet  1  . fish oil-omega-3 fatty acids 1000 MG capsule Take 2 g by mouth daily.      . furosemide (LASIX) 40 MG tablet Take 40 mg by mouth. For swelling, starting 10/30 take one twice a day for 2 days per PCP      . lisinopril (PRINIVIL,ZESTRIL) 20 MG tablet Take 20 mg by mouth daily.      . Multiple Vitamin (MULTIVITAMIN WITH MINERALS) TABS Take 1 tablet by mouth daily.      . nitroGLYCERIN (NITROSTAT) 0.4 MG SL tablet Place 1 tablet (0.4 mg total) under the tongue every 5 (five) minutes as needed.  25 tablet  3  . ondansetron (ZOFRAN ODT) 4 MG disintegrating tablet 4mg  ODT q4 hours prn nausea/vomit  10 tablet  0  . oxyCODONE-acetaminophen (PERCOCET) 5-325 MG per tablet Take 1 tablet by mouth every 6 (six) hours as needed for pain.  40 tablet  0  . traMADol (ULTRAM) 50 MG tablet Take 50 mg by mouth every 6 (six) hours as needed. For pain.      . vitamin C (ASCORBIC ACID) 500 MG tablet Take 500 mg by mouth daily.      Marland Kitchen warfarin (COUMADIN) 5 MG tablet Take 1-1.5 tablets (5-7.5 mg total) by mouth daily. 1 tab daily except 1.5 tabs daily on Mondays, Wednesdays and Fridays.  *Take 1.5 tablets today as well and resume prior to admission regimen.       Allergies  Allergen Reactions  . Ace Inhibitors   . Amlodipine Besylate   . Codeine   . Corzide (Nadolol-Bendroflumethiazide)   . Diovan (Valsartan)   . Flagyl (Metronidazole)   .  Latex   . Norvasc (Amlodipine Besylate)   . Omeprazole   . Penicillins    Patient Active Problem List  Diagnosis  . UNSPECIFIED ANEMIA  . CAD, UNSPECIFIED SITE  . FIBRILLATION, ATRIAL  . ATRIAL FLUTTER  . SYSTOLIC HEART FAILURE, CHRONIC  . RENAL INSUFFICIENCY  . COUGH  . AICD (automatic cardioverter/defibrillator) present  . Ischemic cardiomyopathy  . Encounter for long-term (current) use of anticoagulants  . Leg pain  . Acute on chronic renal insufficiency  . Dehydration with hyponatremia  . Transaminitis  . Gallbladder anomaly  . Abdominal pain, acute, right upper quadrant  . Loss of weight  History  Substance Use Topics  . Smoking status: Never Smoker   . Smokeless tobacco: Never Used  . Alcohol Use: 0.0 oz/week     OCCASIONALLY NOT OFTEN       Objective:   Physical Exam  Oh well-developed chronically ill-appearing white male in no acute distress, accompanied by his wife. Blood pressure 110/70 pulse 60 height 5 foot 8 weight 177. HEENT; nontraumatic normocephalic EOMI PERRLA sclera anicteric conjunctiva are pink, Neck; Supple no appreciable JVD, Cardiovascular; regular rate and rhythm with S1-S2 soft systolic murmur is present he has an AICD in the left chest wall. Pulmonary ;clear bilaterally, Abdomen; soft, bowel sounds are presen,t he has mild generalized abdominal tenderness which  is nonfocal, there's no guarding, no rebound, no palpable mass or hepatosplenomegaly, no fluid wave, Rectal; exam not done, Extremities; 2+ edema to the knees bilaterally which is new., Neuro/psych; alert and oriented x3, mood and affect normal and appropriate.      Assessment & Plan:  #84 66 year old white male with severe ischemic cardiomyopathy with EF of 25% when last documented-now with progressive been fairly constant abdominal pain nausea, intermittent vomiting, and anorexia over the past several weeks. Initial GI evaluation has been negative and strongly suspect that he is currently  having ongoing hypoperfusion of his gut with probable chronic nonocclusive mesenteric ischemia. #2 Chronic congestive heart failure-probably worsened with new development of bilateral lower terminated edema, and intermittent back pain associated with shortness of breath which may be an anginal equivalent #3 chronic renal insufficiency with recent acute renal insufficiency #4 status post AICD #5 chronic anticoagulation with Coumadin-with history of atrial fib  Plan; Have discussed with Dr. Rhea Belton, and Dr. Antoine Poche his cardiologist-patient needs to be admitted to the cardiology service or management of his heart failure and ischemic cardiomyopathy. He may need a formal mesenteric arteriogram done but would defer this to vascular surgery. May also benefit from a trial of dobutamine, to see if this will improve his mesenteric perfusion and decreased his secondary GI symptoms. GI will be available, if can help during this admission. Labs were done today prior to this office visit, and have been reviewed.  Addendum: Agree with the above assessment and plan in this complicated patient. I agree based on his medical history and current symptoms that mesenteric ischemic (chronic, and likely worsened in the setting of recent acute CHF) is the most likely etiology.  EGD done recently and imagine including HIDA have been unrevealing.  Colonoscopy has not been performed, but unlikely to be diagnotic or therapeutic in the management of his current issues. Agree with management per cadiology, and with evaluation by vascular surgery. If cardiology feels positive inotrope is in order, it will be very interesting to see if this alleviated GI complaints. Please call GI consult team with any issues regarding management will hospitalized

## 2012-09-16 NOTE — Addendum Note (Signed)
Addended by: Beverley Fiedler on: 09/16/2012 01:51 PM   Modules accepted: Level of Service

## 2012-09-16 NOTE — Plan of Care (Signed)
Problem: Phase I Progression Outcomes Goal: EF % per last Echo/documented,Core Reminder form on chart Outcome: Completed/Met Date Met:  09/16/12 Pt EF 25% per ech on 09/16/2012

## 2012-09-17 ENCOUNTER — Inpatient Hospital Stay (HOSPITAL_COMMUNITY): Payer: Medicare Other

## 2012-09-17 ENCOUNTER — Encounter (HOSPITAL_COMMUNITY)
Admission: AD | Disposition: A | Discharge status: Home / Self Care | Payer: Self-pay | Source: Ambulatory Visit | Attending: Internal Medicine

## 2012-09-17 DIAGNOSIS — I509 Heart failure, unspecified: Secondary | ICD-10-CM

## 2012-09-17 HISTORY — PX: RIGHT HEART CATHETERIZATION: SHX5447

## 2012-09-17 LAB — POCT I-STAT 3, VENOUS BLOOD GAS (G3P V)
Bicarbonate: 31.6 mEq/L — ABNORMAL HIGH (ref 20.0–24.0)
pH, Ven: 7.403 — ABNORMAL HIGH (ref 7.250–7.300)
pO2, Ven: 28 mmHg — CL (ref 30.0–45.0)

## 2012-09-17 LAB — COMPREHENSIVE METABOLIC PANEL
Albumin: 3.1 g/dL — ABNORMAL LOW (ref 3.5–5.2)
Alkaline Phosphatase: 85 U/L (ref 39–117)
BUN: 17 mg/dL (ref 6–23)
Calcium: 8.6 mg/dL (ref 8.4–10.5)
GFR calc Af Amer: 60 mL/min — ABNORMAL LOW (ref >90)
Glucose, Bld: 99 mg/dL (ref 70–99)
Potassium: 3.4 mEq/L — ABNORMAL LOW (ref 3.5–5.1)
Total Protein: 6.7 g/dL (ref 6.0–8.3)

## 2012-09-17 LAB — PRO B NATRIURETIC PEPTIDE: Pro B Natriuretic peptide (BNP): 9832 pg/mL — ABNORMAL HIGH (ref 0–125)

## 2012-09-17 SURGERY — RIGHT HEART CATH
Anesthesia: LOCAL

## 2012-09-17 MED ORDER — MIDAZOLAM HCL 2 MG/2ML IJ SOLN
INTRAMUSCULAR | Status: AC
  Filled 2012-09-17: qty 2

## 2012-09-17 MED ORDER — HEPARIN (PORCINE) IN NACL 2-0.9 UNIT/ML-% IJ SOLN
INTRAMUSCULAR | Status: AC
  Filled 2012-09-17: qty 1000

## 2012-09-17 MED ORDER — ACETAMINOPHEN 325 MG PO TABS: 650.0000 mg | ORAL_TABLET | ORAL | Status: DC | PRN

## 2012-09-17 MED ORDER — ONDANSETRON HCL 4 MG/2ML IJ SOLN
INTRAMUSCULAR | Status: AC
  Filled 2012-09-17: qty 2

## 2012-09-17 MED ORDER — LIDOCAINE HCL (PF) 1 % IJ SOLN
INTRAMUSCULAR | Status: AC
  Filled 2012-09-17: qty 30

## 2012-09-17 MED ORDER — FUROSEMIDE 10 MG/ML IJ SOLN
80.0000 mg | Freq: Two times a day (BID) | INTRAMUSCULAR | Status: DC
  Administered 2012-09-17: 80 mg via INTRAVENOUS
  Filled 2012-09-17 (×3): qty 8

## 2012-09-17 MED ORDER — POTASSIUM CHLORIDE CRYS ER 20 MEQ PO TBCR
40.0000 meq | EXTENDED_RELEASE_TABLET | Freq: Two times a day (BID) | ORAL | Status: DC
  Administered 2012-09-17 (×2): 40 meq via ORAL
  Filled 2012-09-17 (×4): qty 2

## 2012-09-17 MED ORDER — MILRINONE IN DEXTROSE 20 MG/100ML IV SOLN
0.1250 ug/kg/min | INTRAVENOUS | Status: DC
  Administered 2012-09-17 – 2012-09-20 (×5): 0.25 ug/kg/min via INTRAVENOUS
  Filled 2012-09-17 (×7): qty 100

## 2012-09-17 MED ORDER — SODIUM CHLORIDE 0.9 % IJ SOLN
3.0000 mL | Freq: Two times a day (BID) | INTRAMUSCULAR | Status: DC
  Administered 2012-09-18 – 2012-09-22 (×9): 3 mL via INTRAVENOUS

## 2012-09-17 MED ORDER — ONDANSETRON HCL 4 MG/2ML IJ SOLN
4.0000 mg | Freq: Four times a day (QID) | INTRAMUSCULAR | Status: DC | PRN
  Administered 2012-09-21 (×2): 4 mg via INTRAVENOUS
  Filled 2012-09-17 (×2): qty 2

## 2012-09-17 MED ORDER — SODIUM CHLORIDE 0.9 % IV SOLN: 250.0000 mL | INTRAVENOUS | Status: DC | PRN

## 2012-09-17 MED ORDER — NITROGLYCERIN 0.2 MG/ML ON CALL CATH LAB
INTRAVENOUS | Status: AC
  Filled 2012-09-17: qty 1

## 2012-09-17 MED ORDER — HEPARIN SODIUM (PORCINE) 5000 UNIT/ML IJ SOLN
5000.0000 [IU] | Freq: Three times a day (TID) | INTRAMUSCULAR | Status: DC
  Administered 2012-09-17 – 2012-09-22 (×15): 5000 [IU] via SUBCUTANEOUS
  Filled 2012-09-17 (×17): qty 1

## 2012-09-17 MED ORDER — FENTANYL CITRATE 0.05 MG/ML IJ SOLN
INTRAMUSCULAR | Status: AC
  Filled 2012-09-17: qty 2

## 2012-09-17 MED ORDER — SODIUM CHLORIDE 0.9 % IJ SOLN: 3.0000 mL | INTRAMUSCULAR | Status: DC | PRN

## 2012-09-17 MED ORDER — FUROSEMIDE 10 MG/ML IJ SOLN
80.0000 mg | Freq: Once | INTRAMUSCULAR | Status: AC
  Administered 2012-09-17: 80 mg via INTRAVENOUS
  Filled 2012-09-17: qty 8

## 2012-09-17 NOTE — Progress Notes (Signed)
Patient Name: [name]      SUBJECTIVE: admitted with abd pain going on for about 3 months  . ? Mesenteric ischemia per GI  He is on our service for heart failure magmnt  Permanent AFib with ICM prior revascularization s/p BiV ICD    Edema is new and shortness of breath has been worsening. He is very worried about his renal function  Past Medical History  Diagnosis Date  . Ischemic cardiomyopathy     Myoview 02/2011  EF 28%  Coronary artery disease  (total occlusion of the right coronary artery, left-to-right )  . Atrial flutter     s/p TEE guided cardioversion  . Nonsustained ventricular tachycardia   . LBBB (left bundle branch block)   . Chronic drug-induced interstitial lung disorders   . Acute on chronic renal insufficiency   . Coronary artery disease     total occlusion of the right coronary artery, left to right collaterals.  He had Cypher stenting to the circumflex in  July 2003  . Seizure disorder   . AICD (automatic cardioverter/defibrillator) present     Medtronic CRT  . Hyponatremia   . 6949-lead     replaced 02/2011  . Hyperlipemia   . Hypertension   . Fatty liver     CT  . Family hx of colon cancer     2 SISTERS  . CHF (congestive heart failure)   . Asthma   . COPD (chronic obstructive pulmonary disease)     " very MILD "  . Headache     PHYSICAL EXAM Filed Vitals:   09/16/12 1620 09/16/12 2030 09/17/12 0545 09/17/12 0548  BP: 133/85 126/74 124/79   Pulse:  62 68   Temp:  97.9 F (36.6 C) 97.8 F (36.6 C)   TempSrc:  Oral Oral Oral  Resp:  18 18   Height:      Weight:   168 lb (76.204 kg)   SpO2:  99% 95%       Well developed and nourished in no acute distress HENT normal Neck supple with JVP-8-10 Carotids brisk and full without bruits Clear Regular rate and rhythm, dyskinetic PMI with a midsystolic murmur Abd-soft with active BS without hepatomegaly No Clubbing cyanosis 2+ edema Skin-warm and dry A & Oriented  Grossly normal  sensory and motor function  TELEMETRY:      Intake/Output Summary (Last 24 hours) at 09/17/12 0859 Last data filed at 09/17/12 9604  Gross per 24 hour  Intake    584 ml  Output    550 ml  Net     34 ml    LABS: Basic Metabolic Panel:  Lab 09/17/12 5409 09/16/12 0905 09/14/12 0430 09/13/12 0405 09/12/12 0410 09/11/12 0425 09/10/12 1616  NA 130* 131* 130* 127* 132* 131* 131*  K 3.4* 4.5 3.9 4.1 4.1 4.2 4.2  CL 92* 95* 97 95* 100 97 95*  CO2 28 30 24 22 23 24 26   GLUCOSE 99 106* 83 128* 88 101* 109*  BUN 17 18 15 15 16 17 20   CREATININE 1.37* 1.6* 1.24 1.10 1.18 1.39* 1.55*  CALCIUM 8.6 8.9 -- -- -- -- --  MG -- -- -- -- -- -- --  PHOS -- -- -- -- -- -- --   Cardiac Enzymes: No results found for this basename: CKTOTAL:3,CKMB:3,CKMBINDEX:3,TROPONINI:3 in the last 72 hours CBC:  Lab 09/16/12 0905 09/14/12 0430 09/11/12 0425 09/10/12 1616  WBC 4.9 4.0 5.0 4.8  NEUTROABS 3.5 -- --  3.5  HGB 11.9* 11.5* 11.3* 11.0*  HCT 36.3* 33.4* 32.7* 31.5*  MCV 99.7 94.4 95.1 94.3  PLT 231.0 236 228 211   PROTIME:  Basename 09/16/12 1518  LABPROT 20.5*  INR 1.83*   Liver Function Tests:  Basename 09/17/12 0153  AST 28  ALT 42  ALKPHOS 85  BILITOT 1.2  PROT 6.7  ALBUMIN 3.1*    Basename 09/16/12 0905  LIPASE 22.0  AMYLASE 39   BNP: BNP (last 3 results)  Basename 09/17/12 0153  PROBNP 9832.0*      ASSESSMENT AND PLAN:  Patient Active Hospital Problem List: FIBRILLATION, ATRIAL (01/31/2009)   SYSTOLIC HEART FAILURE, CHRONIC acute   RENAL INSUFFICIENCY (01/30/2009)   Ischemic cardiomyopathy  ICD  Abdominal pain   \ The patient presents with 3 months worth of abdominal pain concerning to GI for mesenteric ischemia. He clearly has evidence of heart failure with right-sided overload and left-sided symptoms as well. I have spoke with Dr. Dorthea Cove today he would like to pursue a right heart catheterization prior to the initiation of inotropes. We will hold off than on  the latter and other modifications of  his medical regime.  Tommie Ard MD  09/17/2012

## 2012-09-17 NOTE — Progress Notes (Signed)
Peripherally Inserted Central Catheter/Midline Placement  The IV Nurse has discussed with the patient and/or persons authorized to consent for the patient, the purpose of this procedure and the potential benefits and risks involved with this procedure.  The benefits include less needle sticks, lab draws from the catheter and patient may be discharged home with the catheter.  Risks include, but not limited to, infection, bleeding, blood clot (thrombus formation), and puncture of an artery; nerve damage and irregular heat beat.  Alternatives to this procedure were also discussed.  PICC/Midline Placement Documentation  PICC / Midline Double Lumen 09/17/12 PICC Right Basilic (Active)   Consent obtain by Jake Michaelis 09/17/2012, 6:02 PM

## 2012-09-17 NOTE — Progress Notes (Signed)
INITIAL ADULT NUTRITION ASSESSMENT Date: 09/17/2012   Time: 9:58 AM Reason for Assessment: MST (Malnutrition Screening Tool)   INTERVENTION: 1. Once diet advanced, will add Ensure BID 2. RD will continue to follow     DOCUMENTATION CODES Per approved criteria  -Severe malnutrition in the context of acute illness or injury    ASSESSMENT: Male 66 y.o.  Dx: Abd pain, nausea   Hx:  Past Medical History  Diagnosis Date  . Ischemic cardiomyopathy     Myoview 02/2011  EF 28%  Coronary artery disease  (total occlusion of the right coronary artery, left-to-right )  . Atrial flutter     s/p TEE guided cardioversion  . Nonsustained ventricular tachycardia   . LBBB (left bundle branch block)   . Chronic drug-induced interstitial lung disorders   . Acute on chronic renal insufficiency   . Coronary artery disease     total occlusion of the right coronary artery, left to right collaterals.  He had Cypher stenting to the circumflex in  July 2003  . Seizure disorder   . AICD (automatic cardioverter/defibrillator) present     Medtronic CRT  . Hyponatremia   . 6949-lead     replaced 02/2011  . Hyperlipemia   . Hypertension   . Fatty liver     CT  . Family hx of colon cancer     2 SISTERS  . CHF (congestive heart failure)   . Asthma   . COPD (chronic obstructive pulmonary disease)     " very MILD "  . Headache     Past Surgical History  Procedure Date  . Insert / replace / remove pacemaker     AICD medtronic  . Hernia repair   . Esophagogastroduodenoscopy 09/13/2012    Procedure: ESOPHAGOGASTRODUODENOSCOPY (EGD);  Surgeon: Meryl Dare, MD,FACG;  Location: [facility];  Service: Endoscopy;  Laterality: N/A;  . Coronary angioplasty     Related Meds:     . calcium carbonate  1 tablet Oral Daily   And  . magnesium oxide  400 mg Oral Daily  . carvedilol  80 mg Oral Daily  . digoxin  0.125 mg Oral QODAY  . famotidine  40 mg Oral BID  . furosemide  40 mg Intravenous BID    . lisinopril  20 mg Oral Daily  . multivitamin with minerals  1 tablet Oral Daily  . omega-3 acid ethyl esters  2 g Oral Daily  . potassium chloride  10 mEq Oral Daily  . sodium chloride  3 mL Intravenous Q12H  . vitamin C  500 mg Oral Daily  . DISCONTD: Calcium-Magnesium  1 tablet Oral Daily  . DISCONTD: co-enzyme Q-10  30 mg Oral TID  . DISCONTD: digoxin  0.125 mg Oral QODAY     Ht: 5\' 8"  (172.7 cm)  Wt: 168 lb (76.204 kg) (scale b)  Ideal Wt: 70 kg  % Ideal Wt: 108%  Usual Wt: 175-180 lbs per pt report  Wt Readings from Last 10 Encounters:  09/17/12 168 lb (76.204 kg)  09/17/12 168 lb (76.204 kg)  09/16/12 177 lb 3.2 oz (80.377 kg)  09/14/12 180 lb (81.647 kg)  09/14/12 180 lb (81.647 kg)  09/07/12 175 lb (79.379 kg)  07/13/12 179 lb 6.4 oz (81.375 kg)  05/26/12 184 lb (83.462 kg)  06/20/11 186 lb (84.369 kg)  04/23/11 186 lb (84.369 kg)    % Usual Wt: 93%  Body mass index is 25.54 kg/(m^2). Pt is overweight per current BMI  Food/Nutrition Related Hx: Pt reports unintentional weight loss and poor intake PTA per MST (Malnutrition Screening Tool)   Labs:  CMP     Component Value Date/Time   NA 130* 09/17/2012 0153   K 3.4* 09/17/2012 0153   CL 92* 09/17/2012 0153   CO2 28 09/17/2012 0153   GLUCOSE 99 09/17/2012 0153   BUN 17 09/17/2012 0153   CREATININE 1.37* 09/17/2012 0153   CALCIUM 8.6 09/17/2012 0153   PROT 6.7 09/17/2012 0153   ALBUMIN 3.1* 09/17/2012 0153   AST 28 09/17/2012 0153   ALT 42 09/17/2012 0153   ALKPHOS 85 09/17/2012 0153   BILITOT 1.2 09/17/2012 0153   GFRNONAA 52* 09/17/2012 0153   GFRAA 60* 09/17/2012 0153     Sodium  Date/Time Value Range Status  09/17/2012  1:53 AM 130* 135 - 145 mEq/L Final  09/16/2012  9:05 AM 131* 135 - 145 mEq/L Final  09/14/2012  4:30 AM 130* 135 - 145 mEq/L Final    Potassium  Date/Time Value Range Status  09/17/2012  1:53 AM 3.4* 3.5 - 5.1 mEq/L Final  09/16/2012  9:05 AM 4.5  3.5 - 5.1 mEq/L Final  09/14/2012   4:30 AM 3.9  3.5 - 5.1 mEq/L Final    No results found for this basename: phos    Magnesium  Date/Time Value Range Status  06/20/2011  9:43 AM 2.4  1.5 - 2.5 mg/dL Final  40/07/8118 14:78 AM 2.7* 1.5-2.5 mg/dL Final     Intake/Output Summary (Last 24 hours) at 09/17/12 1003 Last data filed at 09/17/12 0955  Gross per 24 hour  Intake    584 ml  Output    550 ml  Net     34 ml     Diet Order: NPO  Supplements/Tube Feeding: none   IVF:    Estimated Nutritional Needs:   Kcal: 1800-2000 Protein: 70-80 gm  Fluid:  1.8-2 L   Pt admitted with 3 mo hx of abdominal pain and nausea. GI work up for possible mesenteric ischemia.  Pt states that he has not been able to eat more then 50% of meals daily for about 3 months. Experiences increased pain and nausea with meals.  Pt has lost about 12 lbs (6.7% body weight) in the past 3 months, severe weight loss.   Pt meets criteria for severe malnutrition in the context of acute vs chronic illness 2/2 weight loss of 6.7% in 3 months and meeting </=50% estimated nutrition needs for >/=1 month.   NUTRITION DIAGNOSIS:  Malnutrition r/t abdominal pain and nausea AEB weight loss of 6.7% in 3 months and meeting </=50% estimated nutrition needs for >/=1 month.    MONITORING/EVALUATION(Goals): Goal: Diet advance to meet >90% estimated nutrition needs  Monitor: PO intake, weight, labs, I/O's  EDUCATION NEEDS: -No education needs identified at this time   Clarene Duke RD, LDN Pager [phone] After Hours pager [phone]  09/17/2012, 9:58 AM

## 2012-09-17 NOTE — CV Procedure (Signed)
Cardiac Cath Procedure Note:  Indication:  Heart Failure  Procedures performed:  1) Right heart catheterization  Description of procedure:   The risks and indication of the procedure were explained. Consent was signed and placed on the chart. An appropriate timeout was taken prior to the procedure. The right neck was prepped and draped in the routine sterile fashion and anesthetized with 1% local lidocaine.   A 7 FR venous sheath was placed in the right internal jugular vein using a modified Seldinger technique. A standard Swan-Ganz catheter was used for the procedure. Given the size of the R heart and numerous pacing wires it was very difficult to get the catheter into the PA and a swan wire was employed.   Complications: None apparent.  Findings:  RA = 22 RV = 67/17/22 PA = 65/41 (50) PCW = 38 (v-waves to 45) Fick cardiac output/index = 3.8/2.0 Thermo CO/CI = 2.3/1.2 PVR = 5.0 woods  FA sat = 92% PA sat = 51%, 52%  Assessment: 1. Low output HF with marked volume overload  Plan/Discussion:  Will leave Swan in. Start milrinone. Diurese aggressively. Will see if this improves GI symptoms.  [name] 12:42 PM

## 2012-09-17 NOTE — Progress Notes (Signed)
  Patient seen and examined. Notes reviewed. He has iCM EF 25%. Has had persistent, chronic ab pain for months with nearly 20 pound weight loss. The question has been raised if this is due to HF or intestinal anginal. Recent ab CT showed atherosclerosis of mesenteric vessels but pain is not related to eating and no blood in stools. Other GI work-up has been negative.  On exam, no S3 or significant JVD but he does have 2-3+ edema and NYHA III-IIIB HF symptoms. SBP 110-120.   Will proceed with RHC today to assess volume status and cardiac output. If CO low will startinotropes to see if they improve his symptoms.   Risks/inidcations of RHC via RIJ approach explained. He agrees to proceed.  Haylin Camilli,MD 10:58 AM

## 2012-09-18 DIAGNOSIS — N179 Acute kidney failure, unspecified: Secondary | ICD-10-CM

## 2012-09-18 DIAGNOSIS — I5023 Acute on chronic systolic (congestive) heart failure: Secondary | ICD-10-CM

## 2012-09-18 LAB — CARBOXYHEMOGLOBIN
Carboxyhemoglobin: 1.4 % (ref 0.5–1.5)
Carboxyhemoglobin: 1.4 % (ref 0.5–1.5)
Methemoglobin: 0.7 % (ref 0.0–1.5)
O2 Saturation: 60.3 %

## 2012-09-18 LAB — BASIC METABOLIC PANEL
Chloride: 99 mEq/L (ref 96–112)
GFR calc Af Amer: 63 mL/min — ABNORMAL LOW (ref >90)
Potassium: 3.5 mEq/L (ref 3.5–5.1)
Sodium: 138 mEq/L (ref 135–145)

## 2012-09-18 MED ORDER — POTASSIUM CHLORIDE CRYS ER 20 MEQ PO TBCR
20.0000 meq | EXTENDED_RELEASE_TABLET | Freq: Two times a day (BID) | ORAL | Status: DC
  Administered 2012-09-18 – 2012-09-21 (×7): 20 meq via ORAL
  Filled 2012-09-18 (×8): qty 1

## 2012-09-18 MED ORDER — FUROSEMIDE 10 MG/ML IJ SOLN
80.0000 mg | Freq: Once | INTRAMUSCULAR | Status: AC
  Administered 2012-09-18: 80 mg via INTRAVENOUS

## 2012-09-18 MED ORDER — FUROSEMIDE 10 MG/ML IJ SOLN
80.0000 mg | INTRAMUSCULAR | Status: AC
  Administered 2012-09-18: 80 mg via INTRAVENOUS

## 2012-09-18 MED ORDER — POLYETHYLENE GLYCOL 3350 17 G PO PACK
17.0000 g | PACK | Freq: Every day | ORAL | Status: DC
  Administered 2012-09-18 – 2012-09-22 (×5): 17 g via ORAL
  Filled 2012-09-18 (×6): qty 1

## 2012-09-18 MED ORDER — WARFARIN SODIUM 4 MG PO TABS
8.0000 mg | ORAL_TABLET | Freq: Once | ORAL | Status: AC
  Administered 2012-09-18: 8 mg via ORAL
  Filled 2012-09-18: qty 2

## 2012-09-18 MED ORDER — LISINOPRIL 20 MG PO TABS
20.0000 mg | ORAL_TABLET | Freq: Two times a day (BID) | ORAL | Status: DC
  Administered 2012-09-18 – 2012-09-21 (×7): 20 mg via ORAL
  Filled 2012-09-18 (×11): qty 1

## 2012-09-18 MED ORDER — POTASSIUM CHLORIDE CRYS ER 20 MEQ PO TBCR
40.0000 meq | EXTENDED_RELEASE_TABLET | Freq: Once | ORAL | Status: AC
  Administered 2012-09-18: 40 meq via ORAL

## 2012-09-18 MED ORDER — CARVEDILOL PHOSPHATE ER 20 MG PO CP24
20.0000 mg | ORAL_CAPSULE | Freq: Every day | ORAL | Status: DC
  Administered 2012-09-18 – 2012-09-19 (×2): 20 mg via ORAL
  Filled 2012-09-18 (×2): qty 1

## 2012-09-18 MED ORDER — WARFARIN - PHARMACIST DOSING INPATIENT
Freq: Every day | Status: DC
  Administered 2012-09-20: 18:00:00

## 2012-09-18 MED ORDER — TORSEMIDE 20 MG PO TABS
20.0000 mg | ORAL_TABLET | Freq: Two times a day (BID) | ORAL | Status: DC
  Administered 2012-09-18: 20 mg via ORAL
  Filled 2012-09-18 (×3): qty 1

## 2012-09-18 MED ORDER — BISACODYL 5 MG PO TBEC: 5.0000 mg | DELAYED_RELEASE_TABLET | Freq: Every day | ORAL | Status: DC | PRN

## 2012-09-18 MED ORDER — FUROSEMIDE 10 MG/ML IJ SOLN
80.0000 mg | Freq: Two times a day (BID) | INTRAMUSCULAR | Status: DC
  Administered 2012-09-19: 80 mg via INTRAVENOUS
  Filled 2012-09-18 (×2): qty 8

## 2012-09-18 NOTE — Progress Notes (Signed)
   S: Called by nurse secondary to reduced UO since switch from IV lasix to torsemide.  Further, filling pressures higher and CO/CI down. O:   Filed Vitals:   09/18/12 1500  BP: 124/75  Pulse: 78  Temp: 98.4 F (36.9 C)  Resp: 16   Pleasant, nad, aaox3.  Lungs CTA.  Cor RRR, syst m, apical heave, abd soft, diffusely tender, ext no cce. PA 43/22 (32) CVP 5 PCWP 27 CO 3.2, CI 1.7 SVR 1911 PVR 176  A/P:  1.  Acute on chronic systolic CHF: Pt with reduced output and elevated filling pressures associated with abdominal discomfort - which was his presenting Ss.  Discussed case with Dr. Gala Romney.  Will switch him back to IV lasix and check Co-ox now.  Pt currently hemodynamically stable.  Will cont to follow closely.  Nicolasa Ducking, NP

## 2012-09-18 NOTE — Progress Notes (Addendum)
Patient Name: [name]      SUBJECTIVE: 66 y.o. male w/ PMHx significant for CAD (s/p DES to LCx '03, residual total occlusion of RCA w/ collaterals, normal myoview 2012), ICM (EF 25%, s/p Medtronic BiV ICD), Atrial Fib/Flutter (s/p DCCV, on coumadin), HTN, HLD, CKD, hyponatremia, and Seizure disorder who presented to [facility] on 09/16/2012 with complaints of abdominal discomfort. Has had extensive GI work-up which has been negative except for mesenteric atherosclerosis. Admitted to see if HF cause of sx.  RHC 11/1  RA = 22  RV = 67/17/22  PA = 65/41 (50)  PCW = 38 (v-waves to 45)  Fick cardiac output/index = 3.8/2.0  Thermo CO/CI = 2.3/1.2  PVR = 5.0 woods  FA sat = 92%  PA sat = 51%, 52%  Started on milrinone and lasix increased. I/Os -4L. Weight recorded as down 9 pounds. Renal function slightly improved 1.37 ->1.31. Co-ox up to 60%.   Feels much better. Ab pain resolved. Eager to get [place] out.   Last swan numbers (checked personally at bedside)  RA 4 PA 45/22 PCWP 18 CO/CI 4.3/2.3 SVR 1385  PHYSICAL EXAM Filed Vitals:   09/18/12 0300 09/18/12 0400 09/18/12 0500 09/18/12 0600  BP: 89/48 113/67 120/76 105/48  Pulse: 75 69 75 67  Temp: 97.9 F (36.6 C) 97.9 F (36.6 C) 97.9 F (36.6 C) 97.9 F (36.6 C)  TempSrc:  Core (Comment)    Resp: 15 16 25 14   Height:      Weight:  72.2 kg (159 lb 2.8 oz)    SpO2:  95%        Lying in bed in no acute distress HENT normal Neck supple RIJ swan in place Carotids brisk and full without bruits Clear Regular rate and rhythm, dyskinetic PMI with a midsystolic murmur Abd-soft with active BS without hepatomegaly No Clubbing cyanosis. No edema Skin-warm and dry A & Oriented  Grossly normal sensory and motor function  TELEMETRY:  AF 70-80s    Intake/Output Summary (Last 24 hours) at 09/18/12 0709 Last data filed at 09/18/12 0600  Gross per 24 hour  Intake 1059.8 ml  Output   3950 ml  Net -2890.2 ml     LABS: Basic Metabolic Panel:  Lab 09/18/12 1610 09/17/12 0153 09/16/12 0905 09/14/12 0430 09/13/12 0405 09/12/12 0410  NA 138 130* 131* 130* 127* 132*  K 3.5 3.4* 4.5 3.9 4.1 4.1  CL 99 92* 95* 97 95* 100  CO2 30 28 30 24 22 23   GLUCOSE 101* 99 106* 83 128* 88  BUN 14 17 18 15 15 16   CREATININE 1.31 1.37* 1.6* 1.24 1.10 1.18  CALCIUM 8.3* 8.6 -- -- -- --  MG -- -- -- -- -- --  PHOS -- -- -- -- -- --   Cardiac Enzymes: No results found for this basename: CKTOTAL:3,CKMB:3,CKMBINDEX:3,TROPONINI:3 in the last 72 hours CBC:  Lab 09/16/12 0905 09/14/12 0430  WBC 4.9 4.0  NEUTROABS 3.5 --  HGB 11.9* 11.5*  HCT 36.3* 33.4*  MCV 99.7 94.4  PLT 231.0 236   PROTIME:  Basename 09/16/12 1518  LABPROT 20.5*  INR 1.83*   Liver Function Tests:  Basename 09/17/12 0153  AST 28  ALT 42  ALKPHOS 85  BILITOT 1.2  PROT 6.7  ALBUMIN 3.1*    Basename 09/16/12 0905  LIPASE 22.0  AMYLASE 39   BNP: BNP (last 3 results)  Basename 09/17/12 0153  PROBNP 9832.0*      ASSESSMENT:  1. Chronic abdominal pain suspect due to low-output HF 2. Acute on chronic Systolic CHF with low output 3. Ischemic cardiomyopathy, EF 25% s/p BiV ICD  4. A.fib/flutter on chronic coumadin  5. Acute on Chronic Renal Failure  6. Hyponatremia  7. Coronary Artery Disease  8. Hypertension  9. Hyperlipidemia 10. Anxiety    PLAN:  Much improved on milrinone and aggresive diuresis. The challenge will be how to keep his hemodynamics this way. We will leave swan in at least another day. Change lasix to po demadex decrease carvedilol and titrate lisinopril.   I discussed advanced therapies with him and he is not interested in considering transplant or LVAD. He is not even sure if he would consider home milrinone.  I suspect he will need home inotropes sooner rather than later but we will see if we can wean them.   Restart coumadin for AF.   Arvilla Meres MD  09/18/2012

## 2012-09-18 NOTE — Progress Notes (Signed)
ANTICOAGULATION CONSULT NOTE - Initial Consult  Pharmacy Consult for Warfarin Indication: atrial fibrillation  Allergies  Allergen Reactions  . Ace Inhibitors   . Amlodipine Besylate     Numbness to lips; made legs give out  . Codeine Itching  . Corzide (Nadolol-Bendroflumethiazide)   . Diovan (Valsartan)   . Flagyl (Metronidazole)   . Norvasc (Amlodipine Besylate)     Numbness to lips; made legs give out  . Omeprazole     Shut my kidneys down  . Other     Perfumes smells; paint smells; formaldehyde smells  . Penicillins   . Shellfish Allergy   . Latex Itching and Rash    Patient Measurements: Height: 5\' 8"  (172.7 cm) Weight: 159 lb 2.8 oz (72.2 kg) IBW/kg (Calculated) : 68.4   Vital Signs: Temp: 97.9 F (36.6 C) (11/02 0600) Temp src: Core (Comment) (11/02 0400) BP: 105/48 mmHg (11/02 0600) Pulse Rate: 67  (11/02 0600)  Labs:  Basename 09/18/12 0446 09/17/12 0153 09/16/12 1518 09/16/12 0905  HGB -- -- -- 11.9*  HCT -- -- -- 36.3*  PLT -- -- -- 231.0  APTT -- -- 38* --  LABPROT -- -- 20.5* --  INR -- -- 1.83* --  HEPARINUNFRC -- -- -- --  CREATININE 1.31 1.37* -- 1.6*  CKTOTAL -- -- -- --  CKMB -- -- -- --  TROPONINI -- -- -- --    Estimated Creatinine Clearance: 52.9 ml/min (by C-G formula based on Cr of 1.31).   Medical History: Past Medical History  Diagnosis Date  . Ischemic cardiomyopathy     Myoview 02/2011  EF 28%  Coronary artery disease  (total occlusion of the right coronary artery, left-to-right )  . Atrial flutter     s/p TEE guided cardioversion  . Nonsustained ventricular tachycardia   . LBBB (left bundle branch block)   . Chronic drug-induced interstitial lung disorders   . Acute on chronic renal insufficiency   . Coronary artery disease     total occlusion of the right coronary artery, left to right collaterals.  He had Cypher stenting to the circumflex in  July 2003  . Seizure disorder   . AICD (automatic  cardioverter/defibrillator) present     Medtronic CRT  . Hyponatremia   . 6949-lead     replaced 02/2011  . Hyperlipemia   . Hypertension   . Fatty liver     CT  . Family hx of colon cancer     2 SISTERS  . CHF (congestive heart failure)   . Asthma   . COPD (chronic obstructive pulmonary disease)     " very MILD "  . Headache     Medications:  Prescriptions prior to admission  Medication Sig Dispense Refill  . Calcium-Magnesium 500-250 MG TABS Take 1 tablet by mouth daily.       . carvedilol (COREG CR) 80 MG 24 hr capsule Take 1 capsule (80 mg total) by mouth daily.  30 capsule  9  . co-enzyme Q-10 30 MG capsule Take 30 mg by mouth 3 (three) times daily.        . colchicine 0.6 MG tablet Take 0.6 mg by mouth daily as needed. For gout flareups.      . digoxin (LANOXIN) 0.125 MG tablet Take 0.125 mg by mouth every other day.      . diphenhydrAMINE (BENADRYL) 25 mg capsule Take 25 mg by mouth every 6 (six) hours as needed. For allergies.      Marland Kitchen  famotidine (PEPCID) 40 MG tablet Take 1 tablet (40 mg total) by mouth 2 (two) times daily.  60 tablet  1  . fish oil-omega-3 fatty acids 1000 MG capsule Take 2 g by mouth daily.      . furosemide (LASIX) 40 MG tablet Take 40 mg by mouth daily.      Marland Kitchen lisinopril (PRINIVIL,ZESTRIL) 20 MG tablet Take 20 mg by mouth daily.      . Multiple Vitamin (MULTIVITAMIN WITH MINERALS) TABS Take 1 tablet by mouth daily.      . nitroGLYCERIN (NITROSTAT) 0.4 MG SL tablet Place 0.4 mg under the tongue every 5 (five) minutes as needed. For chest pain      . ondansetron (ZOFRAN-ODT) 4 MG disintegrating tablet Take 4 mg by mouth every 4 (four) hours as needed. For nausea      . oxyCODONE-acetaminophen (PERCOCET/ROXICET) 5-325 MG per tablet Take 1 tablet by mouth every 6 (six) hours as needed. For pain      . potassium chloride SA (K-DUR,KLOR-CON) 20 MEQ tablet Take 20 mEq by mouth daily.      . traMADol (ULTRAM) 50 MG tablet Take 50 mg by mouth every 6 (six) hours  as needed. For pain.      . vitamin C (ASCORBIC ACID) 500 MG tablet Take 500 mg by mouth daily.      Marland Kitchen warfarin (COUMADIN) 5 MG tablet Take 5-7.5 mg by mouth daily. Take 5mg  Sunday, Tuesday, Thursday. And take 7.5mg  Monday, Wednesday, friday      . DISCONTD: nitroGLYCERIN (NITROSTAT) 0.4 MG SL tablet Place 1 tablet (0.4 mg total) under the tongue every 5 (five) minutes as needed.  25 tablet  3  . DISCONTD: ondansetron (ZOFRAN ODT) 4 MG disintegrating tablet 4mg  ODT q4 hours prn nausea/vomit  10 tablet  0  . DISCONTD: oxyCODONE-acetaminophen (PERCOCET) 5-325 MG per tablet Take 1 tablet by mouth every 6 (six) hours as needed for pain.  40 tablet  0    Admit Complaint:  66 y.o. male admitted 09/16/2012 with weight gain, SOB and chronic undefined abdominal pain. Pharmacy following with HF service and peripherally for warf PTA  Overnight Events: 11/2 out 4L in last 24h  Assessment: Anticoagulation: Afib, warfarin to resume, SQ heparin till INR at goal.  Will resume with 8 mg today  Cardiovascular: HF EF 30% coreg, dig 0.125 QOD (10/23 0.5), demadex 20 bid, lisinopril 20 BID, K 20 BID, lovaza Dry Weight: TBD Admit Weight: 168 lb Current Weight:  159 lb 2.8 oz (72.2 kg) : HR 60-90s, 105/48 1059/5250 (-4290)  Gastrointestinal / Nutrition: Chronic abd painL famotidine, Ca  Nephrology/Urology/Electrolytes: Hyponatremia, resolved), SCr 1.31, trend down  PTA Medication Issues: Home Meds Not Ordered: CoQ10, colchicine,   Best Practices: DVT Prophylaxis: SQ heparin, and warfarin   Goal of Therapy:  INR 2-3 Monitor platelets by anticoagulation protocol: Yes   Plan:  1. Warfarin 8 mg PO x 1 today 2. Daily INR, periodic CBC  Thank you for allowing pharmacy to be a part of this patients care team.  Lovenia Kim Pharm.D., BCPS Clinical Pharmacist 09/18/2012 7:44 AM Pager: [phone] Phone: [phone]

## 2012-09-19 DIAGNOSIS — I472 Ventricular tachycardia: Secondary | ICD-10-CM

## 2012-09-19 DIAGNOSIS — I5023 Acute on chronic systolic (congestive) heart failure: Secondary | ICD-10-CM

## 2012-09-19 DIAGNOSIS — N179 Acute kidney failure, unspecified: Secondary | ICD-10-CM

## 2012-09-19 DIAGNOSIS — N189 Chronic kidney disease, unspecified: Secondary | ICD-10-CM

## 2012-09-19 LAB — BASIC METABOLIC PANEL
BUN: 15 mg/dL (ref 6–23)
CO2: 33 mEq/L — ABNORMAL HIGH (ref 19–32)
Calcium: 9.1 mg/dL (ref 8.4–10.5)
GFR calc non Af Amer: 60 mL/min — ABNORMAL LOW (ref >90)
Glucose, Bld: 106 mg/dL — ABNORMAL HIGH (ref 70–99)
Potassium: 3.6 mEq/L (ref 3.5–5.1)

## 2012-09-19 LAB — MAGNESIUM: Magnesium: 1.9 mg/dL (ref 1.5–2.5)

## 2012-09-19 LAB — CARBOXYHEMOGLOBIN
O2 Saturation: 67.4 %
Total hemoglobin: 12.7 g/dL — ABNORMAL LOW (ref 13.5–18.0)

## 2012-09-19 MED ORDER — MAGNESIUM SULFATE 40 MG/ML IJ SOLN
2.0000 g | Freq: Once | INTRAMUSCULAR | Status: AC
  Administered 2012-09-19: 2 g via INTRAVENOUS
  Filled 2012-09-19: qty 50

## 2012-09-19 MED ORDER — FUROSEMIDE 80 MG PO TABS
80.0000 mg | ORAL_TABLET | Freq: Two times a day (BID) | ORAL | Status: DC
  Administered 2012-09-19 – 2012-09-20 (×3): 80 mg via ORAL
  Filled 2012-09-19 (×6): qty 1

## 2012-09-19 MED ORDER — HYDRALAZINE HCL 25 MG PO TABS
25.0000 mg | ORAL_TABLET | Freq: Three times a day (TID) | ORAL | Status: DC
  Administered 2012-09-19 – 2012-09-20 (×3): 25 mg via ORAL
  Filled 2012-09-19 (×6): qty 1

## 2012-09-19 MED ORDER — CARVEDILOL PHOSPHATE ER 10 MG PO CP24
10.0000 mg | ORAL_CAPSULE | Freq: Every day | ORAL | Status: DC
  Administered 2012-09-20 – 2012-09-23 (×4): 10 mg via ORAL
  Filled 2012-09-19 (×4): qty 1

## 2012-09-19 MED ORDER — WARFARIN SODIUM 7.5 MG PO TABS
7.5000 mg | ORAL_TABLET | Freq: Once | ORAL | Status: AC
  Administered 2012-09-19: 7.5 mg via ORAL
  Filled 2012-09-19: qty 1

## 2012-09-19 NOTE — Progress Notes (Signed)
Ward Givens, NP notified of decreasing urine output and significant change in swan measurements.  Will continue to monitor patient closely.

## 2012-09-19 NOTE — Progress Notes (Signed)
Patient Name: [name]      SUBJECTIVE: 66 y.o. male w/ PMHx significant for CAD (s/p DES to LCx '03, residual total occlusion of RCA w/ collaterals, normal myoview 2012), ICM (EF 25%, s/p Medtronic BiV ICD), Atrial Fib/Flutter (s/p DCCV, on coumadin), HTN, HLD, CKD, hyponatremia, and Seizure disorder who presented to [facility] on 09/16/2012 with complaints of abdominal discomfort. Has had extensive GI work-up which has been negative except for mesenteric atherosclerosis. Admitted to see if HF cause of sx.  RHC 11/1  RA = 22  RV = 67/17/22  PA = 65/41 (50)  PCW = 38 (v-waves to 45)  Fick cardiac output/index = 3.8/2.0  Thermo CO/CI = 2.3/1.2  PVR = 5.0 woods  FA sat = 92%  PA sat = 51%, 52%  Started on milrinone and lasix increased.  PWCP was 18 yesterday am and felt better. However, throughout day PCWP increased and got to 27. Felt bad. Lasix increased and hydralazine added. Had brief run NSVT.  Co-ox up to 67% this am but thermo outputs remain low (2.8/1.5) Renal function improving. INR 1.6    PHYSICAL EXAM Filed Vitals:   09/19/12 0500 09/19/12 0540 09/19/12 0600 09/19/12 0700  BP: 106/60 111/71 105/63 102/70  Pulse: 76 69 71 75  Temp: 97.5 F (36.4 C) 97.3 F (36.3 C) 97.5 F (36.4 C) 97.5 F (36.4 C)  TempSrc: Core (Comment) Core (Comment) Core (Comment) Core (Comment)  Resp: 17 13 19 14   Height:      Weight: 70 kg (154 lb 5.2 oz)     SpO2:  100%        Lying in bed in no acute distress HENT normal Neck supple RIJ swan in place Carotids brisk and full without bruits Clear Regular rate and rhythm, dyskinetic PMI with a midsystolic murmur Abd-soft with active BS without hepatomegaly No Clubbing cyanosis. No edema Skin-warm and dry A & Oriented  Grossly normal sensory and motor function  TELEMETRY:  AF 70-80s    Intake/Output Summary (Last 24 hours) at 09/19/12 0844 Last data filed at 09/19/12 0700  Gross per 24 hour  Intake 1644.8 ml    Output   3700 ml  Net -2055.2 ml    LABS: Basic Metabolic Panel:  Lab 09/19/12 1610 09/18/12 0446 09/17/12 0153 09/16/12 0905 09/14/12 0430 09/13/12 0405  NA 134* 138 130* 131* 130* 127*  K 3.6 3.5 3.4* 4.5 3.9 4.1  CL 95* 99 92* 95* 97 95*  CO2 33* 30 28 30 24 22   GLUCOSE 106* 101* 99 106* 83 128*  BUN 15 14 17 18 15 15   CREATININE 1.22 1.31 1.37* 1.6* 1.24 1.10  CALCIUM 9.1 8.3* -- -- -- --  MG 1.9 -- -- -- -- --  PHOS -- -- -- -- -- --   Cardiac Enzymes: No results found for this basename: CKTOTAL:3,CKMB:3,CKMBINDEX:3,TROPONINI:3 in the last 72 hours CBC:  Lab 09/16/12 0905 09/14/12 0430  WBC 4.9 4.0  NEUTROABS 3.5 --  HGB 11.9* 11.5*  HCT 36.3* 33.4*  MCV 99.7 94.4  PLT 231.0 236   PROTIME:  Basename 09/19/12 0439 09/16/12 1518  LABPROT 18.5* 20.5*  INR 1.59* 1.83*   Liver Function Tests:  Basename 09/17/12 0153  AST 28  ALT 42  ALKPHOS 85  BILITOT 1.2  PROT 6.7  ALBUMIN 3.1*    Basename 09/16/12 0905  LIPASE 22.0  AMYLASE 39   BNP: BNP (last 3 results)  Basename 09/17/12 0153  PROBNP 9832.0*  ASSESSMENT:  1. Chronic abdominal pain suspect due to low-output HF 2. Acute on chronic Systolic CHF with low output 3. Ischemic cardiomyopathy, EF 25% s/p BiV ICD  4. A.fib/flutter on chronic coumadin  5. Acute on Chronic Renal Failure  6. Hyponatremia  7. Coronary Artery Disease  8. Hypertension  9. Hyperlipidemia 10. Anxiety 11. NSVT 12. Hypomagnesemia   PLAN:  He remains tenuous but again improved on milrinone and aggresive diuresis. The challenge will be how to keep his hemodynamics this way. We will leave swan in.  Continue to titrate hydralazine to help with high SVR. Cut carvedilol further.   Long discussion with him and his wife about stage D heart failure. I discussed advanced therapies with him as well as home inotropes and Hospice.  He is not sure he is interested in advanced therapies but now at least willing to  consider.  I suspect he will need home inotropes sooner rather than later but we will see if we can wean them over the next few days.  Continue coumadin for AF. Supp magnesium.   Total time spent 1 hour. 80% of that time in discussions regarding advanced HF options.    Arvilla Meres MD  09/19/2012

## 2012-09-19 NOTE — Progress Notes (Signed)
ANTICOAGULATION CONSULT NOTE - Follow Up Consult  Pharmacy Consult for Warfarin Indication: atrial fibrillation  Allergies  Allergen Reactions  . Ace Inhibitors   . Amlodipine Besylate     Numbness to lips; made legs give out  . Codeine Itching  . Corzide (Nadolol-Bendroflumethiazide)   . Diovan (Valsartan)   . Flagyl (Metronidazole)   . Norvasc (Amlodipine Besylate)     Numbness to lips; made legs give out  . Omeprazole     Shut my kidneys down  . Other     Perfumes smells; paint smells; formaldehyde smells  . Penicillins   . Shellfish Allergy   . Latex Itching and Rash    Patient Measurements: Height: 5\' 8"  (172.7 cm) Weight: 154 lb 5.2 oz (70 kg) IBW/kg (Calculated) : 68.4   Vital Signs: Temp: 97.5 F (36.4 C) (11/03 0700) Temp src: Core (Comment) (11/03 0700) BP: 102/70 mmHg (11/03 0700) Pulse Rate: 75  (11/03 0700)  Labs:  Alvira Philips 09/19/12 0439 09/18/12 0446 09/17/12 0153 09/16/12 1518 09/16/12 0905  HGB -- -- -- -- 11.9*  HCT -- -- -- -- 36.3*  PLT -- -- -- -- 231.0  APTT -- -- -- 38* --  LABPROT 18.5* -- -- 20.5* --  INR 1.59* -- -- 1.83* --  HEPARINUNFRC -- -- -- -- --  CREATININE 1.22 1.31 1.37* -- --  CKTOTAL -- -- -- -- --  CKMB -- -- -- -- --  TROPONINI -- -- -- -- --    Estimated Creatinine Clearance: 56.8 ml/min (by C-G formula based on Cr of 1.22).   Medical History: Past Medical History  Diagnosis Date  . Ischemic cardiomyopathy     Myoview 02/2011  EF 28%  Coronary artery disease  (total occlusion of the right coronary artery, left-to-right )  . Atrial flutter     s/p TEE guided cardioversion  . Nonsustained ventricular tachycardia   . LBBB (left bundle branch block)   . Chronic drug-induced interstitial lung disorders   . Acute on chronic renal insufficiency   . Coronary artery disease     total occlusion of the right coronary artery, left to right collaterals.  He had Cypher stenting to the circumflex in  July 2003  . Seizure  disorder   . AICD (automatic cardioverter/defibrillator) present     Medtronic CRT  . Hyponatremia   . 6949-lead     replaced 02/2011  . Hyperlipemia   . Hypertension   . Fatty liver     CT  . Family hx of colon cancer     2 SISTERS  . CHF (congestive heart failure)   . Asthma   . COPD (chronic obstructive pulmonary disease)     " very MILD "  . Headache     Medications:  Prescriptions prior to admission  Medication Sig Dispense Refill  . Calcium-Magnesium 500-250 MG TABS Take 1 tablet by mouth daily.       . carvedilol (COREG CR) 80 MG 24 hr capsule Take 1 capsule (80 mg total) by mouth daily.  30 capsule  9  . co-enzyme Q-10 30 MG capsule Take 30 mg by mouth 3 (three) times daily.        . colchicine 0.6 MG tablet Take 0.6 mg by mouth daily as needed. For gout flareups.      . digoxin (LANOXIN) 0.125 MG tablet Take 0.125 mg by mouth every other day.      . diphenhydrAMINE (BENADRYL) 25 mg capsule Take 25 mg by  mouth every 6 (six) hours as needed. For allergies.      . famotidine (PEPCID) 40 MG tablet Take 1 tablet (40 mg total) by mouth 2 (two) times daily.  60 tablet  1  . fish oil-omega-3 fatty acids 1000 MG capsule Take 2 g by mouth daily.      . furosemide (LASIX) 40 MG tablet Take 40 mg by mouth daily.      Marland Kitchen lisinopril (PRINIVIL,ZESTRIL) 20 MG tablet Take 20 mg by mouth daily.      . Multiple Vitamin (MULTIVITAMIN WITH MINERALS) TABS Take 1 tablet by mouth daily.      . nitroGLYCERIN (NITROSTAT) 0.4 MG SL tablet Place 0.4 mg under the tongue every 5 (five) minutes as needed. For chest pain      . ondansetron (ZOFRAN-ODT) 4 MG disintegrating tablet Take 4 mg by mouth every 4 (four) hours as needed. For nausea      . oxyCODONE-acetaminophen (PERCOCET/ROXICET) 5-325 MG per tablet Take 1 tablet by mouth every 6 (six) hours as needed. For pain      . potassium chloride SA (K-DUR,KLOR-CON) 20 MEQ tablet Take 20 mEq by mouth daily.      . traMADol (ULTRAM) 50 MG tablet Take 50 mg  by mouth every 6 (six) hours as needed. For pain.      . vitamin C (ASCORBIC ACID) 500 MG tablet Take 500 mg by mouth daily.      Marland Kitchen warfarin (COUMADIN) 5 MG tablet Take 5-7.5 mg by mouth daily. Take 5mg  Sunday, Tuesday, Thursday. And take 7.5mg  Monday, Wednesday, friday      . [DISCONTINUED] nitroGLYCERIN (NITROSTAT) 0.4 MG SL tablet Place 1 tablet (0.4 mg total) under the tongue every 5 (five) minutes as needed.  25 tablet  3  . [DISCONTINUED] ondansetron (ZOFRAN ODT) 4 MG disintegrating tablet 4mg  ODT q4 hours prn nausea/vomit  10 tablet  0  . [DISCONTINUED] oxyCODONE-acetaminophen (PERCOCET) 5-325 MG per tablet Take 1 tablet by mouth every 6 (six) hours as needed for pain.  40 tablet  0    Admit Complaint:  66 y.o. male admitted 09/16/2012 with weight gain, SOB and chronic undefined abdominal pain. Pharmacy following with HF service and peripherally for warf PTA  Overnight Events: 11/3 continues to diurese, nausea associated with taking multiple doses of medications at same time  Assessment: Anticoagulation: Afib, warfarin, SQ heparin till INR at goal.  INR at 1.59,   Cardiovascular: HF EF 30% coreg, dig 0.125 QOD (10/23 0.5), demadex 20 bid, lisinopril 20 BID, K 20 BID, lovaza Dry Weight: TBD Admit Weight: 168 lb Current Weight:   Filed Weights   09/17/12 0545 09/18/12 0400 09/19/12 0500  Weight: 168 lb (76.204 kg) 159 lb 2.8 oz (72.2 kg) 154 lb 5.2 oz (70 kg)   HR 60-90s, 105/48 1910/3700(-1789)  Gastrointestinal / Nutrition: Chronic abd painL famotidine, Ca, notes significant nausea when taking all am meds at same time.  Nephrology/Urology/Electrolytes: Hyponatremia, resolved), SCr 1.22, trend down  PTA Medication Issues: Home Meds Not Ordered: CoQ10, colchicine,   Best Practices: DVT Prophylaxis: SQ heparin, and warfarin   Goal of Therapy:  INR 2-3 Monitor platelets by anticoagulation protocol: Yes   Plan:  1. Warfarin 7.5 mg PO x 1 today 2. Daily INR, periodic CBC 3.  Will space out am meds giving 3-4 every few hours to improve nausea  Thank you for allowing pharmacy to be a part of this patients care team.  Lovenia Kim Pharm.D., BCPS Clinical Pharmacist 09/19/2012  7:49 AM Pager: [phone] Phone: [phone]

## 2012-09-20 ENCOUNTER — Telehealth: Payer: Self-pay | Admitting: Cardiology

## 2012-09-20 LAB — BASIC METABOLIC PANEL
BUN: 15 mg/dL (ref 6–23)
Chloride: 94 mEq/L — ABNORMAL LOW (ref 96–112)
Creatinine, Ser: 1.14 mg/dL (ref 0.50–1.35)
Glucose, Bld: 93 mg/dL (ref 70–99)
Potassium: 3.9 mEq/L (ref 3.5–5.1)

## 2012-09-20 LAB — POCT I-STAT 3, VENOUS BLOOD GAS (G3P V)
Bicarbonate: 31.2 mEq/L — ABNORMAL HIGH (ref 20.0–24.0)
TCO2: 33 mmol/L (ref 0–100)
pCO2, Ven: 49.8 mmHg (ref 45.0–50.0)
pH, Ven: 7.405 — ABNORMAL HIGH (ref 7.250–7.300)
pO2, Ven: 28 mmHg — CL (ref 30.0–45.0)

## 2012-09-20 MED ORDER — HYDRALAZINE HCL 25 MG PO TABS
37.5000 mg | ORAL_TABLET | Freq: Three times a day (TID) | ORAL | Status: DC
  Administered 2012-09-20 – 2012-09-21 (×3): 37.5 mg via ORAL
  Filled 2012-09-20 (×6): qty 1.5

## 2012-09-20 MED ORDER — ENSURE COMPLETE PO LIQD
237.0000 mL | Freq: Two times a day (BID) | ORAL | Status: DC
  Administered 2012-09-20 – 2012-09-23 (×6): 237 mL via ORAL

## 2012-09-20 MED ORDER — WARFARIN SODIUM 7.5 MG PO TABS
7.5000 mg | ORAL_TABLET | Freq: Once | ORAL | Status: AC
  Administered 2012-09-20: 7.5 mg via ORAL
  Filled 2012-09-20: qty 1

## 2012-09-20 NOTE — Telephone Encounter (Signed)
F/u  MD calling back to speak with nurse

## 2012-09-20 NOTE — Telephone Encounter (Signed)
This MD only wants to discuss pts care with Dr Gala Romney.  Will forward to him and his nurse.

## 2012-09-20 NOTE — Progress Notes (Signed)
   SUBJECTIVE:  Weak.  Mild lower abdominal pain.  No acute SOB.   PHYSICAL EXAM Filed Vitals:   09/20/12 0552 09/20/12 0600 09/20/12 0700 09/20/12 0718  BP:  112/74 126/82   Pulse:  71 65   Temp:  97.3 F (36.3 C) 97.3 F (36.3 C) 97.5 F (36.4 C)  TempSrc:    Oral  Resp:  13 14   Height:      Weight: 154 lb 1.6 oz (69.9 kg)     SpO2:  97% 91%    General:  Chronically ill appearing Lungs:  Clear Heart:  RRR Abdomen:  Positive bowel sounds, no rebound no guarding Extremities:  No edema Neuro:  Nonfocal   CVP (mmHg) 3; PAP 40/20; PCWP (mmHg) 21; CO (L/min) 2.6;  CI (L/min/m2) 1.3;  PVR (dyne*sec)/cm5 31; SVR (dyne*sec)/cm5 254   LABS:  Results for orders placed during the hospital encounter of 09/16/12 (from the past 24 hour(s))  BASIC METABOLIC PANEL     Status: Abnormal   Collection Time   09/20/12  5:00 AM      Component Value Range   Sodium 134 (*) 135 - 145 mEq/L   Potassium 3.9  3.5 - 5.1 mEq/L   Chloride 94 (*) 96 - 112 mEq/L   CO2 32  19 - 32 mEq/L   Glucose, Bld 93  70 - 99 mg/dL   BUN 15  6 - 23 mg/dL   Creatinine, Ser 1.61  0.50 - 1.35 mg/dL   Calcium 9.0  8.4 - 09.6 mg/dL   GFR calc non Af Amer 65 (*) >90 mL/min   GFR calc Af Amer 75 (*) >90 mL/min  PROTIME-INR     Status: Abnormal   Collection Time   09/20/12  5:00 AM      Component Value Range   Prothrombin Time 19.9 (*) 11.6 - 15.2 seconds   INR 1.76 (*) 0.00 - 1.49  CARBOXYHEMOGLOBIN     Status: Normal   Collection Time   09/20/12  5:25 AM      Component Value Range   Total hemoglobin 15.7  13.5 - 18.0 g/dL   O2 Saturation 04.5     Carboxyhemoglobin 1.3  0.5 - 1.5 %   Methemoglobin 0.8  0.0 - 1.5 %    Intake/Output Summary (Last 24 hours) at 09/20/12 0744 Last data filed at 09/20/12 0700  Gross per 24 hour  Intake 2226.8 ml  Output   2900 ml  Net -673.2 ml   EKG:  Atrial fibrillation, ventricular pacing rate 70, ventricular ectopy.  ASSESSMENT AND PLAN:  Acute on chronic systolic  heart failure:  I will discuss with the HF team.  I would like to wean the milrinone and continue to titrate meds at home and in the HF clinic.  I know this patient very well and any discussions about advanced therapies are very difficult for him and his wife.  Today I will try to increase the hydralazine today.    Renal insufficiency:  Creat is stable at 1.14    Atrial fibrillation:  Continue warfarin per pharmacy  ICD BiV Medtronic  Acute on chronic renal failure   [name] 09/20/2012 7:44 AM

## 2012-09-20 NOTE — Progress Notes (Signed)
ANTICOAGULATION CONSULT NOTE - Follow Up Consult  Pharmacy Consult for Coumadin Indication: atrial fibrillation  Allergies  Allergen Reactions  . Ace Inhibitors   . Amlodipine Besylate     Numbness to lips; made legs give out  . Codeine Itching  . Corzide (Nadolol-Bendroflumethiazide)   . Diovan (Valsartan)   . Flagyl (Metronidazole)   . Norvasc (Amlodipine Besylate)     Numbness to lips; made legs give out  . Omeprazole     Shut my kidneys down  . Other     Perfumes smells; paint smells; formaldehyde smells  . Penicillins   . Shellfish Allergy   . Latex Itching and Rash    Vital Signs: Temp: 97.5 F (36.4 C) (11/04 1118) Temp src: Oral (11/04 1118) BP: 99/84 mmHg (11/04 0900) Pulse Rate: 61  (11/04 1000)  Labs:  Basename 09/20/12 0500 09/19/12 0439 09/18/12 0446  HGB -- -- --  HCT -- -- --  PLT -- -- --  APTT -- -- --  LABPROT 19.9* 18.5* --  INR 1.76* 1.59* --  HEPARINUNFRC -- -- --  CREATININE 1.14 1.22 1.31  CKTOTAL -- -- --  CKMB -- -- --  TROPONINI -- -- --    Estimated Creatinine Clearance: 60.8 ml/min (by C-G formula based on Cr of 1.14).  Assessment: 65yom continues on coumadin for afib. INR is subtherapeutic but trending up. No CBC today. No bleeding.   Goal of Therapy:  INR 2-3 Heparin level 0.3-0.7 units/ml Monitor platelets by anticoagulation protocol: Yes   Plan:  1) Repeat coumadin 7.5mg  x 1 2) Follow up INR in AM  [name] 09/20/2012,11:27 AM

## 2012-09-20 NOTE — Telephone Encounter (Signed)
New problem:  Patient admit to [facility] - by Dr. Gala Romney .  MD need to discuss his stay while he's at [facility].

## 2012-09-20 NOTE — Care Management Note (Signed)
    Page 1 of 1   09/22/2012     8:20:36 AM   CARE MANAGEMENT NOTE 09/22/2012  Patient:  [name], [name]   Account Number:  [id number]  Date Initiated:  09/20/2012  Documentation initiated by:  Avie Arenas  Subjective/Objective Assessment:   CHF - abd pain.     Action/Plan:   Anticipated DC Date:  09/24/2012   Anticipated DC Plan:  HOME W HOME HEALTH SERVICES  In-house referral  Clinical Social Worker      DC Planning Services  CM consult      Choice offered to / List presented to:             Status of service:  In process, will continue to follow Medicare Important Message given?   (If response is "NO", the following Medicare IM given date fields will be blank) Date Medicare IM given:   Date Additional Medicare IM given:    Discharge Disposition:    Per UR Regulation:  Reviewed for med. necessity/level of care/duration of stay  If discussed at Long Length of Stay Meetings, dates discussed:    Comments:  ContactRashawd, [name] [phone]   [phone]  09-21-12 8:15am Avie Arenas, RNBSN [phone] Talked with wife and patient a couple of times through day. Plan is still for dc home with [facility] - not sure of agency yet. Working with family.  may or may not go home on milrinone.  09-20-12 9:10am Avie Arenas, RNBSN [phone] Talked with wife and patient at bedside. Patient sitting up in bed - awake,alert, looks good.  Talked about discharge options. Patient and wife in agreement they would like to go home with [facility].  Aware he may be on Milrinone drip on discharge.  Aware of that and willing to care for this with the help of HH.  Live in [place] - know some HH agencies but would like to talk with family/friends about options.  Will wait till wife gets back with [place] on [facility] agency prior to starting process.  Would also like hospital bed at home - as cannot lay flat and uncomfortable on pillows.  May also need HH PT depending on strength when starts to move. CM  will continue to follow.

## 2012-09-21 LAB — BASIC METABOLIC PANEL
Chloride: 93 mEq/L — ABNORMAL LOW (ref 96–112)
Creatinine, Ser: 1.06 mg/dL (ref 0.50–1.35)
GFR calc Af Amer: 82 mL/min — ABNORMAL LOW (ref >90)
Potassium: 3.8 mEq/L (ref 3.5–5.1)
Sodium: 131 mEq/L — ABNORMAL LOW (ref 135–145)

## 2012-09-21 LAB — PROTIME-INR
INR: 1.88 — ABNORMAL HIGH (ref 0.00–1.49)
Prothrombin Time: 20.9 seconds — ABNORMAL HIGH (ref 11.6–15.2)

## 2012-09-21 LAB — CARBOXYHEMOGLOBIN
Carboxyhemoglobin: 1.5 % (ref 0.5–1.5)
Methemoglobin: 0.9 % (ref 0.0–1.5)
O2 Saturation: 79.8 %
O2 Saturation: 70.5 %
Total hemoglobin: 13.2 g/dL — ABNORMAL LOW (ref 13.5–18.0)

## 2012-09-21 MED ORDER — WARFARIN SODIUM 7.5 MG PO TABS
7.5000 mg | ORAL_TABLET | Freq: Once | ORAL | Status: AC
  Administered 2012-09-21: 7.5 mg via ORAL
  Filled 2012-09-21: qty 1

## 2012-09-21 MED ORDER — SALINE SPRAY 0.65 % NA SOLN
2.0000 | Freq: Every day | NASAL | Status: DC
  Administered 2012-09-21 – 2012-09-23 (×2): 2 via NASAL
  Filled 2012-09-21: qty 44

## 2012-09-21 MED ORDER — SPIRONOLACTONE 12.5 MG HALF TABLET
12.5000 mg | ORAL_TABLET | Freq: Every day | ORAL | Status: DC
  Administered 2012-09-21 – 2012-09-23 (×3): 12.5 mg via ORAL
  Filled 2012-09-21 (×3): qty 1

## 2012-09-21 MED ORDER — HYDRALAZINE HCL 50 MG PO TABS
50.0000 mg | ORAL_TABLET | Freq: Three times a day (TID) | ORAL | Status: DC
  Administered 2012-09-21 – 2012-09-23 (×6): 50 mg via ORAL
  Filled 2012-09-21 (×9): qty 1

## 2012-09-21 MED ORDER — FUROSEMIDE 80 MG PO TABS
80.0000 mg | ORAL_TABLET | Freq: Two times a day (BID) | ORAL | Status: DC
  Administered 2012-09-21 (×2): 80 mg via ORAL
  Filled 2012-09-21 (×5): qty 1

## 2012-09-21 NOTE — Progress Notes (Signed)
[facility]    Briefly reviewing the chart, it is evident to me that this patient has significant cardiac issues.  Once more stable and renal function improved, consider CTA abd/pelvis to evaluate for mesenteric atherosclerosis.  We'll be happy to see the patient again once the CTA is completed.  Leonides Sake, MD [facility] [place] Office: [phone] Pager: [phone]  09/21/2012, 10:17 AM

## 2012-09-21 NOTE — Progress Notes (Signed)
ANTICOAGULATION CONSULT NOTE - Follow Up Consult  Pharmacy Consult for Coumadin Indication: atrial fibrillation  Allergies  Allergen Reactions  . Ace Inhibitors   . Amlodipine Besylate     Numbness to lips; made legs give out  . Codeine Itching  . Corzide (Nadolol-Bendroflumethiazide)   . Diovan (Valsartan)   . Flagyl (Metronidazole)   . Norvasc (Amlodipine Besylate)     Numbness to lips; made legs give out  . Omeprazole     Shut my kidneys down  . Other     Perfumes smells; paint smells; formaldehyde smells  . Penicillins   . Shellfish Allergy   . Latex Itching and Rash    Vital Signs: Temp: 97.9 F (36.6 C) (11/05 0900) Temp src: Oral (11/05 0800) BP: 113/68 mmHg (11/05 0900) Pulse Rate: 88  (11/05 0900)  Labs:  Basename 09/21/12 0500 09/20/12 0500 09/19/12 0439  HGB -- -- --  HCT -- -- --  PLT -- -- --  APTT -- -- --  LABPROT 20.9* 19.9* 18.5*  INR 1.88* 1.76* 1.59*  HEPARINUNFRC -- -- --  CREATININE 1.06 1.14 1.22  CKTOTAL -- -- --  CKMB -- -- --  TROPONINI -- -- --    Estimated Creatinine Clearance: 65.4 ml/min (by C-G formula based on Cr of 1.06).  Assessment: 65yom continues on coumadin for afib with subq heparin until INR therapeutic. INR remains subtherapeutic but trending up to 1.88 today. No CBC today. No bleeding noted.   Goal of Therapy:  INR 2-3 Heparin level 0.3-0.7 units/ml Monitor platelets by anticoagulation protocol: Yes   Plan:  1) Repeat coumadin 7.5mg  x 1 2) Follow up INR in AM    Doris Cheadle, PharmD Clinical Pharmacist Pager: [phone] Phone: [phone] 09/21/2012 9:30 AM

## 2012-09-21 NOTE — Progress Notes (Signed)
eLink Physician-Brief Progress Note Patient Name: [name] DOB: [birth year] MRN: [id number]  Date of Service  09/21/2012   HPI/Events of Note   Dry nose  eICU Interventions  Saline nasal spray   Intervention Category Minor Interventions: Other:  [name] 09/21/2012, 2:20 AM

## 2012-09-21 NOTE — Progress Notes (Signed)
Patient Name: [name]      SUBJECTIVE: 66 y.o. male w/ PMHx significant for CAD (s/p DES to LCx '03, residual total occlusion of RCA w/ collaterals, normal myoview 2012), ICM (EF 25%, s/p Medtronic BiV ICD), Atrial Fib/Flutter (s/p DCCV, on coumadin), HTN, HLD, CKD, hyponatremia, and Seizure disorder who presented to [facility] on 09/16/2012 with complaints of abdominal discomfort. Has had extensive GI work-up which has been negative except for mesenteric atherosclerosis. Admitted to see if HF cause of sx.  RHC 11/1  RA = 22  RV = 67/17/22  PA = 65/41 (50)  PCW = 38 (v-waves to 45)  Fick cardiac output/index = 3.8/2.0  Thermo CO/CI = 2.3/1.2  PVR = 5.0 woods  FA sat = 92%  PA sat = 51%, 52%  09/17/12 Started on milrinone and lasix increased. Yesterday Milrinone cut back to 0.125 mcg and hydralazine titrated up to 37.5 mg tid. Overall weight down 15 pounds.   Creatinine continues to trend down. 1.14>1.06 CO-OX 60.3>80  0500 am Swan Numbers SVR 1610  PAP 36/17 PCWP 11 CO/CI  2.9/1.5  SBP 113/78 (86) PA 49/27 (36) PCWP 19 CVP 4 Co-ox 80% -> CO/CI by fick 6.8/3.7 (? Accurate) SVR 960  Denies SOB/PND/Orthopnea.   PHYSICAL EXAM Filed Vitals:   09/21/12 0600 09/21/12 0700 09/21/12 0800 09/21/12 0900  BP: 118/77 97/54 111/73 113/68  Pulse: 76 70 77 88  Temp: 97.3 F (36.3 C) 97.3 F (36.3 C) 97.3 F (36.3 C) 97.9 F (36.6 C)  TempSrc:   Oral   Resp: 15 15 10 13   Height:      Weight:      SpO2: 98% 97% 98% 99%   CVP: 8-9   Lying in bed in no acute distress HEENT :normal Neck :supple RIJ swan in place Carotids brisk and full without bruits. JVP 5-6 Lungs: Clear Cor:  rate and rhythm, dyskinetic PMI with a midsystolic murmur Abd-soft with active BS without hepatomegaly Extremities:  Clubbing cyanosis. No edema Skin-warm and dry A & Oriented  Grossly normal sensory and motor function  TELEMETRY:  AF 70-80s     Intake/Output Summary (Last 24  hours) at 09/21/12 1059 Last data filed at 09/21/12 0900  Gross per 24 hour  Intake 1288.23 ml  Output   2025 ml  Net -736.77 ml    LABS: Basic Metabolic Panel:  Lab 09/21/12 2956 09/20/12 0500 09/19/12 0439 09/18/12 0446 09/17/12 0153 09/16/12 0905  NA 131* 134* 134* 138 130* 131*  K 3.8 3.9 3.6 3.5 3.4* 4.5  CL 93* 94* 95* 99 92* 95*  CO2 30 32 33* 30 28 30   GLUCOSE 100* 93 106* 101* 99 106*  BUN 14 15 15 14 17 18   CREATININE 1.06 1.14 1.22 1.31 1.37* 1.6*  CALCIUM 8.9 9.0 -- -- -- --  MG -- -- 1.9 -- -- --  PHOS -- -- -- -- -- --   Cardiac Enzymes: No results found for this basename: CKTOTAL:3,CKMB:3,CKMBINDEX:3,TROPONINI:3 in the last 72 hours CBC:  Lab 09/16/12 0905  WBC 4.9  NEUTROABS 3.5  HGB 11.9*  HCT 36.3*  MCV 99.7  PLT 231.0   PROTIME:  Basename 09/21/12 0500 09/20/12 0500 09/19/12 0439  LABPROT 20.9* 19.9* 18.5*  INR 1.88* 1.76* 1.59*   Liver Function Tests: No results found for this basename: AST:2,ALT:2,ALKPHOS:2,BILITOT:2,PROT:2,ALBUMIN:2 in the last 72 hours No results found for this basename: LIPASE:2,AMYLASE:2 in the last 72 hours BNP: BNP (last 3 results)  Basename 09/17/12  0153  PROBNP 9832.0*      ASSESSMENT:  1. Chronic abdominal pain suspect due to low-output HF 2. Acute on chronic Systolic CHF with low output 3. Ischemic cardiomyopathy, EF 25% s/p BiV ICD  4. A.fib/flutter on chronic coumadin  5. Acute on Chronic Renal Failure  6. Hyponatremia  7. Coronary Artery Disease  8. Hypertension  9. Hyperlipidemia 10. Anxiety 11. NSVT 12. Hypomagnesemia   PLAN:  Volume status, renal function and CO stable/improved. Will stop milrinone today. Titrate hydralazine to 50 tid. Change diuretics to po.   Will keep swan in today and follow numbers closely off inotropes. If numbers stable will pull Swan tomorrow and plan d/c soon. If deteriorating will need to reconsider need for advanced therapies.   I have discussed the case with Dr.  Antoine Poche and will continue with aggressive medical therapy as long as possible before considering advanced therapies as patient has been reluctant to pursue more advanced therapies in past.  Continue coumadin for AF.  Honestii Marton,MD 11:15 AM

## 2012-09-22 LAB — BASIC METABOLIC PANEL
Calcium: 9.1 mg/dL (ref 8.4–10.5)
GFR calc non Af Amer: 63 mL/min — ABNORMAL LOW (ref >90)
Glucose, Bld: 92 mg/dL (ref 70–99)
Sodium: 134 mEq/L — ABNORMAL LOW (ref 135–145)

## 2012-09-22 LAB — CARBOXYHEMOGLOBIN
O2 Saturation: 74.3 %
O2 Saturation: 69.7 %
Total hemoglobin: 13.5 g/dL (ref 13.5–18.0)

## 2012-09-22 LAB — PROTIME-INR: INR: 1.88 — ABNORMAL HIGH (ref 0.00–1.49)

## 2012-09-22 MED ORDER — SODIUM CHLORIDE 0.9 % IJ SOLN
10.0000 mL | INTRAMUSCULAR | Status: DC | PRN
  Administered 2012-09-22: 20 mL

## 2012-09-22 MED ORDER — FUROSEMIDE 40 MG PO TABS
40.0000 mg | ORAL_TABLET | Freq: Every day | ORAL | Status: DC
  Administered 2012-09-22: 40 mg via ORAL
  Filled 2012-09-22: qty 1

## 2012-09-22 MED ORDER — LISINOPRIL 20 MG PO TABS
20.0000 mg | ORAL_TABLET | Freq: Two times a day (BID) | ORAL | Status: DC
  Administered 2012-09-22 – 2012-09-23 (×2): 20 mg via ORAL
  Filled 2012-09-22 (×4): qty 1

## 2012-09-22 MED ORDER — SODIUM CHLORIDE 0.9 % IJ SOLN: 10.0000 mL | Freq: Two times a day (BID) | INTRAMUSCULAR | Status: DC

## 2012-09-22 MED ORDER — ALTEPLASE 2 MG IJ SOLR
2.0000 mg | Freq: Once | INTRAMUSCULAR | Status: AC
  Administered 2012-09-22: 2 mg
  Filled 2012-09-22: qty 2

## 2012-09-22 MED ORDER — WARFARIN SODIUM 7.5 MG PO TABS
7.5000 mg | ORAL_TABLET | Freq: Once | ORAL | Status: AC
  Administered 2012-09-22: 7.5 mg via ORAL
  Filled 2012-09-22: qty 1

## 2012-09-22 NOTE — Progress Notes (Signed)
Nutrition Follow-up  Intervention:   1. Continue Ensure BID 2. RD will continue to follow    Assessment:   Pt s/p right heart cath 11/1.   States he is eating well. When he is not able to eat a good meal, drinks an Ensure   Diet Order:  Heart Supplements: Ensure Complete BID  PO intake: 50-100%  Meds: Scheduled Meds:   . calcium carbonate  1 tablet Oral Daily   And  . magnesium oxide  400 mg Oral Daily  . carvedilol  10 mg Oral Daily  . digoxin  0.125 mg Oral QODAY  . famotidine  40 mg Oral BID  . feeding supplement  237 mL Oral BID BM  . furosemide  40 mg Oral Daily  . heparin  5,000 Units Subcutaneous Q8H  . hydrALAZINE  50 mg Oral Q8H  . lisinopril  20 mg Oral BID  . multivitamin with minerals  1 tablet Oral Daily  . omega-3 acid ethyl esters  2 g Oral Daily  . polyethylene glycol  17 g Oral Daily  . sodium chloride  2 spray Each Nare Daily  . sodium chloride  3 mL Intravenous Q12H  . sodium chloride  3 mL Intravenous Q12H  . spironolactone  12.5 mg Oral Daily  . vitamin C  500 mg Oral Daily  . [COMPLETED] warfarin  7.5 mg Oral ONCE-1800  . Warfarin - Pharmacist Dosing Inpatient   Does not apply q1800  . [DISCONTINUED] furosemide  80 mg Oral BID  . [DISCONTINUED] potassium chloride  20 mEq Oral BID   Continuous Infusions:  PRN Meds:.sodium chloride, sodium chloride, acetaminophen, bisacodyl, nitroGLYCERIN, ondansetron (ZOFRAN) IV, ondansetron, oxyCODONE-acetaminophen, sodium chloride, sodium chloride, traMADol   CMP     Component Value Date/Time   NA 134* 09/22/2012 0600   K 3.9 09/22/2012 0600   CL 96 09/22/2012 0600   CO2 30 09/22/2012 0600   GLUCOSE 92 09/22/2012 0600   BUN 16 09/22/2012 0600   CREATININE 1.16 09/22/2012 0600   CALCIUM 9.1 09/22/2012 0600   PROT 6.7 09/17/2012 0153   ALBUMIN 3.1* 09/17/2012 0153   AST 28 09/17/2012 0153   ALT 42 09/17/2012 0153   ALKPHOS 85 09/17/2012 0153   BILITOT 1.2 09/17/2012 0153   GFRNONAA 63* 09/22/2012 0600   GFRAA 73*  09/22/2012 0600    CBG (last 3)  No results found for this basename: GLUCAP:3 in the last 72 hours   Intake/Output Summary (Last 24 hours) at 09/22/12 0910 Last data filed at 09/22/12 0600  Gross per 24 hour  Intake   1004 ml  Output   3475 ml  Net  -2471 ml    Weight Status:  151 lbs, trending down with fluid status  Re-estimated needs:  1800-2000 kcal, 70-80 gm protein   Nutrition Dx:  Malnutrition r/t abdominal pain and nausea AEB weight loss of 6.7% in 3 months and meeting </=50% estimated nutrition needs for >/=1 month   Goal: Diet advance to meet >/=90% estimated nutrition needs.  --met New Goal: Meal completion >50% most meals   Monitor:  PO intake, weight, labs   Clarene Duke RD, LDN Pager [phone] After Hours pager [phone]

## 2012-09-22 NOTE — Progress Notes (Signed)
Patient Name: [name]      SUBJECTIVE: 66 y.o. male w/ PMHx significant for CAD (s/p DES to LCx '03, residual total occlusion of RCA w/ collaterals, normal myoview 2012), ICM (EF 25%, s/p Medtronic BiV ICD), Atrial Fib/Flutter (s/p DCCV, on coumadin), HTN, HLD, CKD, hyponatremia, and Seizure disorder who presented to [facility] on 09/16/2012 with complaints of abdominal discomfort. Has had extensive GI work-up which has been negative except for mesenteric atherosclerosis. Admitted to see if HF cause of sx.  RHC 11/1  RA = 22  RV = 67/17/22  PA = 65/41 (50)  PCW = 38 (v-waves to 45)  Fick cardiac output/index = 3.8/2.0  Thermo CO/CI = 2.3/1.2  PVR = 5.0 woods  FA sat = 92%  PA sat = 51%, 52%  09/17/12 Started on milrinone and lasix increased. 09/21/12 Milrinone stopped,  Hydralazine titrated up to 50 mg tid and he was switched to po lasix. Last night swan out additional 4 cm and orders received to stop measuring swan number.  Overall weight down 18 pounds.   Creatinine continues to trend down. 1.14>1.06>1.16 CO-OX 74.3 FICK CO/CI 4.6/ 2.54   Denies SOB/PND/Orthopnea/dizziness  PHYSICAL EXAM Filed Vitals:   09/22/12 0700 09/22/12 0800 09/22/12 0900 09/22/12 0936  BP: 89/49 93/64  94/53  Pulse: 74 69 79 82  Temp: 98.2 F (36.8 C) 98.2 F (36.8 C)    TempSrc:      Resp: 19 18 16 18   Height:      Weight:      SpO2: 97% 98% 100% 98%    Lying in bed in no acute distress HEENT:  normal Neck :supple RIJ swan in place Carotids brisk and full without bruits. JVP 5-6 Lungs: Clear Cor:  rate and rhythm, dyskinetic PMI with a midsystolic murmur Abd-soft with active BS without hepatomegaly Extremities:  Clubbing cyanosis. No edema Skin-warm and dry A & Oriented  Grossly normal sensory and motor function  TELEMETRY:  AF 70-80s     Intake/Output Summary (Last 24 hours) at 09/22/12 1037 Last data filed at 09/22/12 0600  Gross per 24 hour  Intake    864 ml    Output   3475 ml  Net  -2611 ml    LABS: Basic Metabolic Panel:  Lab 09/22/12 5284 09/21/12 0500 09/20/12 0500 09/19/12 0439 09/18/12 0446 09/17/12 0153 09/16/12 0905  NA 134* 131* 134* 134* 138 130* 131*  K 3.9 3.8 3.9 3.6 3.5 3.4* 4.5  CL 96 93* 94* 95* 99 92* 95*  CO2 30 30 32 33* 30 28 30   GLUCOSE 92 100* 93 106* 101* 99 106*  BUN 16 14 15 15 14 17 18   CREATININE 1.16 1.06 1.14 1.22 1.31 1.37* 1.6*  CALCIUM 9.1 8.9 -- -- -- -- --  MG -- -- -- 1.9 -- -- --  PHOS -- -- -- -- -- -- --   Cardiac Enzymes: No results found for this basename: CKTOTAL:3,CKMB:3,CKMBINDEX:3,TROPONINI:3 in the last 72 hours CBC:  Lab 09/16/12 0905  WBC 4.9  NEUTROABS 3.5  HGB 11.9*  HCT 36.3*  MCV 99.7  PLT 231.0   PROTIME:  Basename 09/21/12 0500 09/20/12 0500  LABPROT 20.9* 19.9*  INR 1.88* 1.76*   Liver Function Tests: No results found for this basename: AST:2,ALT:2,ALKPHOS:2,BILITOT:2,PROT:2,ALBUMIN:2 in the last 72 hours No results found for this basename: LIPASE:2,AMYLASE:2 in the last 72 hours BNP: BNP (last 3 results)  Basename 09/17/12 0153  PROBNP 9832.0*  ASSESSMENT:  1. Chronic abdominal pain suspect due to low-output HF 2. Acute on chronic Systolic CHF with low output 3. Ischemic cardiomyopathy, EF 25% s/p BiV ICD  4. A.fib/flutter on chronic coumadin  5. Acute on Chronic Renal Failure  6. Hyponatremia  7. Coronary Artery Disease  8. Hypertension  9. Hyperlipidemia 10. Anxiety 11. NSVT 12. Hypomagnesemia   PLAN:  Much improved. Appears euvolemic/dry. Overall weight down 18 pounds. Cardiac output looks good by Fick but remains low by thermodilution. Unsure why the difference. No shunting when checking SVC sat and PA sat. Fick more reliable  Given soft BP.  Will cut back lasix to 40 mg daily. Continue Spironolactone. May have to cut back hydralazine a bit. Transfer to 4700 today.  Possibly home tomorrow. Would consider CPX as outpatient to further assess  need for advanced therapies.  Continue coumadin for AF.  Eliott Amparan,MD 10:50 AM

## 2012-09-22 NOTE — Progress Notes (Signed)
CARDIAC REHAB PHASE I   PRE:  Rate/Rhythm: 75 pacing with PVCs    BP: sitting 103/66    SaO2:   MODE:  Ambulation: 700 ft   POST:  Rate/Rhythm: 108 pacing    BP: sitting 110/75     SaO2: 99 RA  Upon arrival pt ambulating in room back and forth numerous times independently. Able to walk 700 ft in hall. No c/o, no SOB. Sts he has some abdominal discomfort. VSS. Gave HF booklet.  [id number]  Harriet Masson CES, ACSM

## 2012-09-22 NOTE — Progress Notes (Signed)
Removed swan line.    Removed venous sheath right neck without difficulty.  Held light pressure for 10 minutes.   Pt tolerated well .  Vss.

## 2012-09-22 NOTE — Progress Notes (Signed)
Nursing note - Bedside report off going shift noted that [employer] at 54cm but had extreme difficulty wedging and zeroing line.  MD aware.  During PM assessment Swan Line found at 58cm and verified by 2 other RN's at beside including Land.  Attempted to zero CVP and Swan lines - poor wave forms with both.  Dr. Mayford Knife on floor with new admission.  Asked for guidance.  Dr. Mayford Knife noted to not pull back line and not to wedge line and hold until the am for further assessment.  Line secured with tape.  No ectopy noted on monitor and patient c/o of no symptoms.  Patient resting without distress or discomfort. VSS

## 2012-09-22 NOTE — Progress Notes (Signed)
ANTICOAGULATION CONSULT NOTE - Follow Up Consult  Pharmacy Consult for Coumadin Indication: atrial fibrillation  Allergies  Allergen Reactions  . Ace Inhibitors   . Amlodipine Besylate     Numbness to lips; made legs give out  . Codeine Itching  . Corzide (Nadolol-Bendroflumethiazide)   . Diovan (Valsartan)   . Flagyl (Metronidazole)   . Norvasc (Amlodipine Besylate)     Numbness to lips; made legs give out  . Omeprazole     Shut my kidneys down  . Other     Perfumes smells; paint smells; formaldehyde smells  . Penicillins   . Shellfish Allergy   . Latex Itching and Rash    Vital Signs: Temp: 98.2 F (36.8 C) (11/06 0800) Temp src: Core (Comment) (11/06 0600) BP: 96/53 mmHg (11/06 1235) Pulse Rate: 97  (11/06 1100)  Labs:  Basename 09/22/12 0915 09/22/12 0600 09/21/12 0500 09/20/12 0500  HGB -- -- -- --  HCT -- -- -- --  PLT -- -- -- --  APTT -- -- -- --  LABPROT 20.9* -- 20.9* 19.9*  INR 1.88* -- 1.88* 1.76*  HEPARINUNFRC -- -- -- --  CREATININE -- 1.16 1.06 1.14  CKTOTAL -- -- -- --  CKMB -- -- -- --  TROPONINI -- -- -- --    Estimated Creatinine Clearance: 59.8 ml/min (by C-G formula based on Cr of 1.16).  Assessment: 65yom continues on coumadin for afib with subq heparin until INR therapeutic. INR 1.88 again today after the third attempt to collect blood sample.  No bleeding noted. Hgd stable.  Home dose warfarin 7.5gm WMF, 5mg  TTSS.  I am hesitant to increase warfarin dose to much as it may take a big jump, and not sure I believe PT/INR is the exact same today.    Goal of Therapy:   Monitor platelets by anticoagulation protocol: Yes   Plan:  1) Repeat coumadin 7.5mg  x 1 2) Follow up INR in AM    Leota Sauers Pharm.D. CPP, BCPS Clinical Pharmacist [phone] 09/22/2012 3:26 PM

## 2012-09-23 LAB — CARBOXYHEMOGLOBIN
Carboxyhemoglobin: 1.5 % (ref 0.5–1.5)
Methemoglobin: 0.8 % (ref 0.0–1.5)
Total hemoglobin: 13.1 g/dL — ABNORMAL LOW (ref 13.5–18.0)

## 2012-09-23 LAB — BASIC METABOLIC PANEL
BUN: 21 mg/dL (ref 6–23)
Calcium: 8.9 mg/dL (ref 8.4–10.5)
Creatinine, Ser: 1.23 mg/dL (ref 0.50–1.35)
GFR calc non Af Amer: 59 mL/min — ABNORMAL LOW (ref >90)
Glucose, Bld: 92 mg/dL (ref 70–99)
Sodium: 132 mEq/L — ABNORMAL LOW (ref 135–145)

## 2012-09-23 MED ORDER — FUROSEMIDE 40 MG PO TABS
40.0000 mg | ORAL_TABLET | Freq: Two times a day (BID) | ORAL | Status: DC
  Administered 2012-09-23: 40 mg via ORAL
  Filled 2012-09-23 (×3): qty 1

## 2012-09-23 MED ORDER — FUROSEMIDE 40 MG PO TABS: 40.0000 mg | ORAL_TABLET | Freq: Two times a day (BID) | ORAL | Status: DC

## 2012-09-23 MED ORDER — CARVEDILOL PHOSPHATE ER 10 MG PO CP24: 10.0000 mg | ORAL_CAPSULE | Freq: Every day | ORAL | Status: DC

## 2012-09-23 MED ORDER — LISINOPRIL 20 MG PO TABS: 20.0000 mg | ORAL_TABLET | Freq: Two times a day (BID) | ORAL | Status: DC

## 2012-09-23 MED ORDER — SPIRONOLACTONE 12.5 MG HALF TABLET: 12.5000 mg | ORAL_TABLET | Freq: Every day | ORAL | Status: DC

## 2012-09-23 MED ORDER — HYDRALAZINE HCL 50 MG PO TABS: 50.0000 mg | ORAL_TABLET | Freq: Three times a day (TID) | ORAL | Status: DC

## 2012-09-23 NOTE — Progress Notes (Signed)
Patient Name: [name]      SUBJECTIVE: 66 y.o. male w/ PMHx significant for CAD (s/p DES to LCx '03, residual total occlusion of RCA w/ collaterals, normal myoview 2012), ICM (EF 25%, s/p Medtronic BiV ICD), Atrial Fib/Flutter (s/p DCCV, on coumadin), HTN, HLD, CKD, hyponatremia, and Seizure disorder who presented to [facility] on 09/16/2012 with complaints of abdominal discomfort. Has had extensive GI work-up which has been negative except for mesenteric atherosclerosis. Admitted to see if HF cause of sx.  RHC 11/1  RA = 22  RV = 67/17/22  PA = 65/41 (50)  PCW = 38 (v-waves to 45)  Fick cardiac output/index = 3.8/2.0  Thermo CO/CI = 2.3/1.2  PVR = 5.0 woods  FA sat = 92%  PA sat = 51%, 52%  09/17/12 Started on milrinone and lasix increased. 09/21/12 Milrinone stopped,  Hydralazine titrated up to 50 mg tid and he was switched to po lasix. Last night swan out additional 4 cm and orders received to stop measuring swan number.  Overall weight down 18 pounds.   Much improved. Feels good. Eager to go home weight down 14 pounds.  PHYSICAL EXAM Filed Vitals:   09/22/12 2006 09/22/12 2110 09/23/12 0442 09/23/12 0808  BP: 128/78 125/84 107/64 104/61  Pulse: 77 69 75 79  Temp: 97.5 F (36.4 C)  98.5 F (36.9 C)   TempSrc: Axillary  Oral   Resp: 20  20   Height:      Weight:   70.5 kg (155 lb 6.8 oz)   SpO2: 100%  98%     Lying in bed in no acute distress HEENT:  normal Neck :supple RIJ swan in place Carotids brisk and full without bruits. JVP 5-6 Lungs: Clear Cor:  rate and rhythm, dyskinetic PMI with a midsystolic murmur Abd-soft with active BS without hepatomegaly Extremities:  Clubbing cyanosis. No edema Skin-warm and dry A & Oriented  Grossly normal sensory and motor function  TELEMETRY:  AF 70-80s     Intake/Output Summary (Last 24 hours) at 09/23/12 0957 Last data filed at 09/23/12 0900  Gross per 24 hour  Intake    620 ml  Output    750 ml  Net    -130 ml    LABS: Basic Metabolic Panel:  Lab 09/23/12 5784 09/22/12 0600 09/21/12 0500 09/20/12 0500 09/19/12 0439 09/18/12 0446 09/17/12 0153  NA 132* 134* 131* 134* 134* 138 130*  K 4.1 3.9 3.8 3.9 3.6 3.5 3.4*  CL 95* 96 93* 94* 95* 99 92*  CO2 28 30 30  32 33* 30 28  GLUCOSE 92 92 100* 93 106* 101* 99  BUN 21 16 14 15 15 14 17   CREATININE 1.23 1.16 1.06 1.14 1.22 1.31 1.37*  CALCIUM 8.9 9.1 -- -- -- -- --  MG -- -- -- -- 1.9 -- --  PHOS -- -- -- -- -- -- --   Cardiac Enzymes: No results found for this basename: CKTOTAL:3,CKMB:3,CKMBINDEX:3,TROPONINI:3 in the last 72 hours CBC: No results found for this basename: WBC:7,NEUTROABS:7,HGB:7,HCT:7,MCV:7,PLT:7 in the last 168 hours PROTIME:  Basename 09/23/12 0548 09/22/12 0915 09/21/12 0500  LABPROT 22.4* 20.9* 20.9*  INR 2.06* 1.88* 1.88*   Liver Function Tests: No results found for this basename: AST:2,ALT:2,ALKPHOS:2,BILITOT:2,PROT:2,ALBUMIN:2 in the last 72 hours No results found for this basename: LIPASE:2,AMYLASE:2 in the last 72 hours BNP: BNP (last 3 results)  Basename 09/17/12 0153  PROBNP 9832.0*      ASSESSMENT:  1. Chronic abdominal pain  suspect due to low-output HF 2. Acute on chronic Systolic CHF with low output 3. Ischemic cardiomyopathy, EF 25% s/p BiV ICD  4. A.fib/flutter on chronic coumadin  5. Acute on Chronic Renal Failure  6. Hyponatremia  7. Coronary Artery Disease  8. Hypertension  9. Hyperlipidemia 10. Anxiety 11. NSVT 12. Hypomagnesemia   PLAN:  Much improved. Appears euvolemic/dry. Overall weight down 14 pounds.  Will send home today with close f/u with Dr. Antoine Poche as well as HF clinic.  Discussed med changes with him at length. He is somewhat hesitant to change but willing to try. See d/c summary for new med list.   Will see in HF clinic on Thursday and get CPX.   Andrez Lieurance,MD 9:57 AM

## 2012-09-23 NOTE — Progress Notes (Signed)
Pt d/c home w/son. DC instructions and medications review with Pt and son. Pt and son state understanding. All questions answered

## 2012-09-23 NOTE — Progress Notes (Signed)
ANTICOAGULATION CONSULT NOTE - Follow Up Consult  Pharmacy Consult for Coumadin Indication: atrial fibrillation  Allergies  Allergen Reactions  . Ace Inhibitors   . Amlodipine Besylate     Numbness to lips; made legs give out  . Codeine Itching  . Corzide (Nadolol-Bendroflumethiazide)   . Diovan (Valsartan)   . Flagyl (Metronidazole)   . Norvasc (Amlodipine Besylate)     Numbness to lips; made legs give out  . Omeprazole     Shut my kidneys down  . Other     Perfumes smells; paint smells; formaldehyde smells  . Penicillins   . Shellfish Allergy   . Latex Itching and Rash    Vital Signs: Temp: 98.5 F (36.9 C) (11/07 0442) Temp src: Oral (11/07 0442) BP: 104/61 mmHg (11/07 0808) Pulse Rate: 79  (11/07 0808)  Labs:  Basename 09/23/12 0548 09/22/12 0915 09/22/12 0600 09/21/12 0500  HGB -- -- -- --  HCT -- -- -- --  PLT -- -- -- --  APTT -- -- -- --  LABPROT 22.4* 20.9* -- 20.9*  INR 2.06* 1.88* -- 1.88*  HEPARINUNFRC -- -- -- --  CREATININE 1.23 -- 1.16 1.06  CKTOTAL -- -- -- --  CKMB -- -- -- --  TROPONINI -- -- -- --    Estimated Creatinine Clearance: 56.4 ml/min (by C-G formula based on Cr of 1.23).  Assessment: 65yom continues on coumadin for afib with subq heparin until INR therapeutic. INR now at goal.  No bleeding noted. Hgd stable.  Home dose warfarin 7.5gm WMF, 5mg  TTSS. Will continue with this regimen.  Patient denies bleeding and bruising.  Goal of Therapy:   Monitor platelets by anticoagulation protocol: Yes   Plan:  1) Repeat coumadin 5mg  today 2) Follow up INR in AM    Leota Sauers Pharm.D. CPP, BCPS Clinical Pharmacist [phone] 09/23/2012 10:15 AM

## 2012-09-23 NOTE — Progress Notes (Signed)
PICC line removed per order.  No signs of infection.  Verbalizes via teachback method signs of infection and intervention for infection and bleeding.  No ADN.

## 2012-09-23 NOTE — Progress Notes (Signed)
09/23/12 1330 In to speak with pt. and step son about [facility] services and hospital bed.  Pt. stated he wanted a hospital bed due to his breathing.  Pt. has Congestive Heart Failure and requires a hospital bed to elevate his HOB at 30-40degrees as well as being able to elevate his legs as well.  This NCM spoke with pt. spouse over telephone as well and answered her questions of pt. care. Pt. chose [facility].  TC to [facility] [phone]) to give referral for [facility] RN, [facility] Aide, [facility] PT and faxed facesheet, history and physical, HH orders to ([phone]).  TC to [facility], with [facility] to make referral for DME hospital bed.  Pt. to dc home today. Tera Mater, RN, BSN NCM [phone]

## 2012-09-26 DIAGNOSIS — I509 Heart failure, unspecified: Secondary | ICD-10-CM | POA: Diagnosis not present

## 2012-09-26 DIAGNOSIS — I4891 Unspecified atrial fibrillation: Secondary | ICD-10-CM | POA: Diagnosis not present

## 2012-09-26 DIAGNOSIS — Z87440 Personal history of urinary (tract) infections: Secondary | ICD-10-CM | POA: Diagnosis not present

## 2012-09-26 DIAGNOSIS — I129 Hypertensive chronic kidney disease with stage 1 through stage 4 chronic kidney disease, or unspecified chronic kidney disease: Secondary | ICD-10-CM | POA: Diagnosis not present

## 2012-09-26 DIAGNOSIS — N189 Chronic kidney disease, unspecified: Secondary | ICD-10-CM | POA: Diagnosis not present

## 2012-09-27 DIAGNOSIS — I509 Heart failure, unspecified: Secondary | ICD-10-CM | POA: Diagnosis not present

## 2012-09-27 DIAGNOSIS — I129 Hypertensive chronic kidney disease with stage 1 through stage 4 chronic kidney disease, or unspecified chronic kidney disease: Secondary | ICD-10-CM | POA: Diagnosis not present

## 2012-09-27 DIAGNOSIS — I4891 Unspecified atrial fibrillation: Secondary | ICD-10-CM | POA: Diagnosis not present

## 2012-09-27 DIAGNOSIS — Z87440 Personal history of urinary (tract) infections: Secondary | ICD-10-CM | POA: Diagnosis not present

## 2012-09-27 DIAGNOSIS — N189 Chronic kidney disease, unspecified: Secondary | ICD-10-CM | POA: Diagnosis not present

## 2012-09-28 ENCOUNTER — Ambulatory Visit (INDEPENDENT_AMBULATORY_CARE_PROVIDER_SITE_OTHER): Payer: Medicare Other | Admitting: *Deleted

## 2012-09-28 ENCOUNTER — Other Ambulatory Visit: Payer: Self-pay

## 2012-09-28 DIAGNOSIS — I4891 Unspecified atrial fibrillation: Secondary | ICD-10-CM

## 2012-09-28 DIAGNOSIS — Z7901 Long term (current) use of anticoagulants: Secondary | ICD-10-CM

## 2012-09-28 DIAGNOSIS — I4892 Unspecified atrial flutter: Secondary | ICD-10-CM | POA: Diagnosis not present

## 2012-09-28 MED ORDER — DIGOXIN 125 MCG PO TABS: 0.1250 mg | ORAL_TABLET | ORAL | Status: DC

## 2012-09-28 NOTE — Telephone Encounter (Signed)
..   Requested Prescriptions   Pending Prescriptions Disp Refills  . digoxin (LANOXIN) 0.125 MG tablet 15 tablet 10    Sig: Take 1 tablet (0.125 mg total) by mouth every other day.

## 2012-09-29 DIAGNOSIS — I509 Heart failure, unspecified: Secondary | ICD-10-CM | POA: Diagnosis not present

## 2012-09-29 DIAGNOSIS — Z87440 Personal history of urinary (tract) infections: Secondary | ICD-10-CM | POA: Diagnosis not present

## 2012-09-29 DIAGNOSIS — N189 Chronic kidney disease, unspecified: Secondary | ICD-10-CM | POA: Diagnosis not present

## 2012-09-29 DIAGNOSIS — I4891 Unspecified atrial fibrillation: Secondary | ICD-10-CM | POA: Diagnosis not present

## 2012-09-29 DIAGNOSIS — I129 Hypertensive chronic kidney disease with stage 1 through stage 4 chronic kidney disease, or unspecified chronic kidney disease: Secondary | ICD-10-CM | POA: Diagnosis not present

## 2012-09-30 ENCOUNTER — Ambulatory Visit (HOSPITAL_COMMUNITY)
Admit: 2012-09-30 | Discharge: 2012-09-30 | Disposition: A | Discharge status: Home / Self Care | Payer: Medicare Other | Attending: Internal Medicine | Admitting: Internal Medicine

## 2012-09-30 ENCOUNTER — Encounter (HOSPITAL_COMMUNITY): Payer: Self-pay

## 2012-09-30 VITALS — BP 116/82 | HR 100 | Ht 68.0 in | Wt 154.8 lb

## 2012-09-30 DIAGNOSIS — N402 Nodular prostate without lower urinary tract symptoms: Secondary | ICD-10-CM | POA: Diagnosis not present

## 2012-09-30 DIAGNOSIS — I5022 Chronic systolic (congestive) heart failure: Secondary | ICD-10-CM

## 2012-09-30 DIAGNOSIS — R3129 Other microscopic hematuria: Secondary | ICD-10-CM | POA: Diagnosis not present

## 2012-09-30 LAB — BASIC METABOLIC PANEL
BUN: 30 mg/dL — ABNORMAL HIGH (ref 6–23)
Calcium: 9.6 mg/dL (ref 8.4–10.5)
Creatinine, Ser: 1.33 mg/dL (ref 0.50–1.35)
GFR calc non Af Amer: 54 mL/min — ABNORMAL LOW (ref >90)
Glucose, Bld: 109 mg/dL — ABNORMAL HIGH (ref 70–99)

## 2012-09-30 MED ORDER — FUROSEMIDE 40 MG PO TABS: 40.0000 mg | ORAL_TABLET | Freq: Every day | ORAL | Status: DC

## 2012-09-30 MED ORDER — FAMOTIDINE 40 MG PO TABS: 40.0000 mg | ORAL_TABLET | Freq: Two times a day (BID) | ORAL | Status: DC

## 2012-09-30 NOTE — Patient Instructions (Addendum)
Follow up in 2 weeks with Dr Gala Romney  Schedule CPX  Take lasix 40 mg daily.  If your weight is 155 pounds or greater take an extra Lasix 40 mg  Do the following things EVERYDAY: 1) Weigh yourself in the morning before breakfast. Write it down and keep it in a log. 2) Take your medicines as prescribed 3) Eat low salt foods-Limit salt (sodium) to 2000 mg per day.  4) Stay as active as you can everyday 5) Limit all fluids for the day to less than 2 liters

## 2012-09-30 NOTE — Assessment & Plan Note (Addendum)
He is much improved. Probably a little on the dry side. Weight at home trending down. Reinforced need for daily weights and reviewed use of sliding scale diuretics. Cut lasix to 40 mg daily and take an additional 40 mg of Lasix if his weight is 155 pounds or greater. Remains reluctant about advanced therapies. We had a long discussion about next steps. Will plan CPX to help decision making. Would not increase carvedilol at this point. Check BMET today. Follow up in 2 weeks.

## 2012-09-30 NOTE — Progress Notes (Signed)
Patient ID: [name], male   DOB: January 17, 1946, 66 y.o.   MRN: [id number]  Primary cardiologist: Dr. Antoine Poche  HPI: 66 y.o. male w/ PMHx significant for CAD (s/p DES to LCx '03, residual total occlusion of RCA w/ collaterals, normal myoview 2012), ICM (EF 25%, s/p Medtronic BiV ICD), Atrial Fib/Flutter (s/p DCCV, on coumadin), HTN, HLD, CKD, hyponatremia, and Seizure disorder  Admitted to [facility] 09/16/12 for ongoing ab pain thought to be related tho HF. GI w/u to date has been negative. CT scan showed diffuse mesenteric calcification but pain not felt to be intestinal claudication. D/C from [facility] 09/23/12. Presented with NYHA III-IV symptoms. Required Milrionone due to low output heart failure. Diuresed 14 pounds. Discharge weight 155 pounds. Advanced Heart Therapies discussed during admission which included LVAD, home inotropes, or hospice. He was somewhat reluctant to consider. RHC 09/17/2012  RA = 22  RV = 67/17/22  PA = 65/41 (50)  PCW = 38 (v-waves to 45)  Fick cardiac output/index = 3.8/2.0  Thermo CO/CI = 2.3/1.2  PVR = 5.0 woods  FA sat = 92%  PA sat = 51%, 52%   He presents for post hospital follow up. Overall feels good. Complains of ongoing stomach discomfort. Says he is not sure it is any better. But wife says it is much improved. Says he is not waking up with pain any more. No BRBPR or melena. Denies SOB/PND/Orthopnea. Does complain of dizziness. He is able to walk steps without dyspnea. Weight at home trending down from 154 to 149 pounds.    ROS: All systems negative except as listed in HPI, PMH and Problem List.  Past Medical History  Diagnosis Date  . Ischemic cardiomyopathy     Myoview 02/2011  EF 28%  Coronary artery disease  (total occlusion of the right coronary artery, left-to-right )  . Atrial flutter     s/p TEE guided cardioversion  . Nonsustained ventricular tachycardia   . LBBB (left bundle branch block)   . Chronic drug-induced interstitial lung disorders   .  Acute on chronic renal insufficiency   . Coronary artery disease     total occlusion of the right coronary artery, left to right collaterals.  He had Cypher stenting to the circumflex in  July 2003  . Seizure disorder   . AICD (automatic cardioverter/defibrillator) present     Medtronic CRT  . Hyponatremia   . 6949-lead     replaced 02/2011  . Hyperlipemia   . Hypertension   . Fatty liver     CT  . Family hx of colon cancer     2 SISTERS  . CHF (congestive heart failure)   . Asthma   . COPD (chronic obstructive pulmonary disease)     " very MILD "  . Headache     Current Outpatient Prescriptions  Medication Sig Dispense Refill  . Calcium-Magnesium 500-250 MG TABS Take 1 tablet by mouth daily.       Marland Kitchen co-enzyme Q-10 30 MG capsule Take 30 mg by mouth 3 (three) times daily.        . colchicine 0.6 MG tablet Take 0.6 mg by mouth daily as needed. For gout flareups.      . diphenhydrAMINE (BENADRYL) 25 mg capsule Take 25 mg by mouth every 6 (six) hours as needed. For allergies.      . famotidine (PEPCID) 40 MG tablet Take 1 tablet (40 mg total) by mouth 2 (two) times daily.  60 tablet  1  .  fish oil-omega-3 fatty acids 1000 MG capsule Take 2 g by mouth daily.      . furosemide (LASIX) 40 MG tablet Take 1 tablet (40 mg total) by mouth 2 (two) times daily.  60 tablet  6  . hydrALAZINE (APRESOLINE) 50 MG tablet Take 1 tablet (50 mg total) by mouth every 8 (eight) hours.  90 tablet  3  . lisinopril (PRINIVIL,ZESTRIL) 20 MG tablet Take 1 tablet (20 mg total) by mouth 2 (two) times daily.  60 tablet  6  . Multiple Vitamin (MULTIVITAMIN WITH MINERALS) TABS Take 1 tablet by mouth daily.      . nitroGLYCERIN (NITROSTAT) 0.4 MG SL tablet Place 0.4 mg under the tongue every 5 (five) minutes as needed. For chest pain      . ondansetron (ZOFRAN-ODT) 4 MG disintegrating tablet Take 4 mg by mouth every 4 (four) hours as needed. For nausea      . oxyCODONE-acetaminophen (PERCOCET/ROXICET) 5-325 MG per  tablet Take 1 tablet by mouth every 6 (six) hours as needed. For pain      . potassium chloride SA (K-DUR,KLOR-CON) 20 MEQ tablet Take 20 mEq by mouth daily.      Marland Kitchen spironolactone (ALDACTONE) 12.5 mg TABS Take 0.5 tablets (12.5 mg total) by mouth daily.  30 tablet  3  . traMADol (ULTRAM) 50 MG tablet Take 50 mg by mouth every 6 (six) hours as needed. For pain.      . vitamin C (ASCORBIC ACID) 500 MG tablet Take 500 mg by mouth daily.      Marland Kitchen warfarin (COUMADIN) 5 MG tablet Take 5-7.5 mg by mouth daily. Take 5mg  Sunday, Tuesday, Thursday. And take 7.5mg  Monday, Wednesday, friday      . carvedilol (COREG CR) 10 MG 24 hr capsule Take 1 capsule (10 mg total) by mouth daily.  30 capsule  3  . digoxin (LANOXIN) 0.125 MG tablet Take 1 tablet (0.125 mg total) by mouth every other day.  15 tablet  10     PHYSICAL EXAM: Filed Vitals:   09/30/12 1030  BP: 116/82  Pulse: 100   General:  Thin. Well appearing. No resp difficulty HEENT: normal Neck: supple. JVP flat. Carotids 2+ bilaterally; no bruits. No lymphadenopathy or thryomegaly appreciated. Cor: PMI normal. Regular rate & rhythm. No rubs, gallops or murmurs. Lungs: clear Abdomen: soft, nontender, nondistended. No hepatosplenomegaly. No bruits or masses. Good bowel sounds. Extremities: no cyanosis, clubbing, rash, edema Neuro: alert & orientedx3, cranial nerves grossly intact. Moves all 4 extremities w/o difficulty. Affect pleasant.     ASSESSMENT & PLAN:

## 2012-10-03 DIAGNOSIS — N189 Chronic kidney disease, unspecified: Secondary | ICD-10-CM | POA: Diagnosis not present

## 2012-10-03 DIAGNOSIS — Z87440 Personal history of urinary (tract) infections: Secondary | ICD-10-CM | POA: Diagnosis not present

## 2012-10-03 DIAGNOSIS — I509 Heart failure, unspecified: Secondary | ICD-10-CM | POA: Diagnosis not present

## 2012-10-03 DIAGNOSIS — I4891 Unspecified atrial fibrillation: Secondary | ICD-10-CM | POA: Diagnosis not present

## 2012-10-03 DIAGNOSIS — I129 Hypertensive chronic kidney disease with stage 1 through stage 4 chronic kidney disease, or unspecified chronic kidney disease: Secondary | ICD-10-CM | POA: Diagnosis not present

## 2012-10-04 ENCOUNTER — Ambulatory Visit: Payer: Medicare Other | Admitting: Cardiology

## 2012-10-04 NOTE — Discharge Planning (Signed)
Advanced Heart Failure Team  Discharge Summary   Patient ID: [name] MRN: [id number], DOB/AGE: 02/03/1946 66 y.o. Admit date: 09/16/2012 D/C date:     09/23/2012   Primary Discharge Diagnoses:  1. Chronic abdominal pain suspect due to low-output HF  2. Acute on chronic Systolic CHF with low output  3. Ischemic cardiomyopathy, EF 25% s/p BiV ICD  4. A.fib/flutter on chronic coumadin  5. Acute on Chronic Renal Failure  6. Hyponatremia  7. Coronary Artery Disease  8. Hypertension  9. Hyperlipidemia  10. Anxiety  11. NSVT  12. Hypomagnesemia     Hospital Course: 66 y.o. male w/ PMHx significant for CAD (s/p DES to LCx '03, residual total occlusion of RCA w/ collaterals, normal myoview 2012), ICM (EF 25%, s/p Medtronic BiV ICD), Atrial Fib/Flutter (s/p DCCV, on coumadin), HTN, HLD, CKD, hyponatremia, and Seizure disorder.   He presented to [facility] on 09/16/2012 with complaints of abdominal discomfort. Has had extensive GI work-up which has been negative except for mesenteric atherosclerosis. He was later admitted to see if HF cause of sx.  Pertinent admission labs included Pro BNP 9832, Potassium 4.5, and Creatinine 1.6. Initially he was diuresed with Lasix 40 mg IV bid but he had minimal improvement. At that point a RHC was performed which revealed low output HF. Post cath,  swan ganz catheter remained in place for ongoing hemodynamic monitoring. He was also started on Milrinone drip and IV lasix was increased.  He continued to diurese and his SVR started to trend down with up titration of afterload reduction medications.  On 09/21/12, Milrinone was weaned off.  CO-OX remained stable off Milrinone. He was diuresed 14 pounds and transitioned to po lasix. Offered to change to carvedilol 3.125 mg twice a day but he preferred to continue on carvedilol Cr 10 mg daily.  Swan ganz catheter removed 09/22/12. PICC removed 09/23/12. Discharge weight 155 pounds.   Lengthy discussions  occurred with with him and his wife about stage D heart failure. I discussed advanced therapies with him as well as home inotropes and Hospice. He is not sure he is interested in advanced therapies but now at least willing to consider. He will continue to be followed closely with Dr Antoine Poche and in the HF clinic at [facility]. He will be followed by [facility] for [facility] and HHPT.   At the time of discharge he was medically stable and ambulating without dyspnea.     RHC 09/17/2012 RA = 22  RV = 67/17/22  PA = 65/41 (50)  PCW = 38 (v-waves to 45)  Fick cardiac output/index = 3.8/2.0  Thermo CO/CI = 2.3/1.2  PVR = 5.0 woods  FA sat = 92%  PA sat = 51%, 52%         Discharge Weight Range:   Admit Weight: 169 pounds  Discharge Weight:  155 pounds Discharge Vitals: Blood pressure 104/61, pulse 70, temperature 97.1 F (36.2 C), temperature source Oral, resp. rate 20, height 5\' 8"  (1.727 m), weight 70.5 kg (155 lb 6.8 oz), SpO2 96.00%.  Labs: Lab Results  Component Value Date   WBC 4.9 09/16/2012   HGB 11.9* 09/16/2012   HCT 36.3* 09/16/2012   MCV 99.7 09/16/2012   PLT 231.0 09/16/2012     Lab 09/30/12 1128  NA 133*  K 4.5  CL 98  CO2 24  BUN 30*  CREATININE 1.33  CALCIUM 9.6  PROT --  BILITOT --  ALKPHOS --  ALT --  AST --  GLUCOSE 109*   Lab Results  Component Value Date   CHOL 169 09/13/2010   HDL 29* 09/13/2010   LDLCALC 125* 09/13/2010   TRIG 77 09/13/2010   BNP (last 3 results)  Basename 09/17/12 0153  PROBNP 9832.0*    Diagnostic Studies/Procedures   No results found.  Discharge Medications     Medication List     As of 10/04/2012  2:43 PM    STOP taking these medications         furosemide 40 MG tablet   Commonly known as: LASIX      TAKE these medications         Calcium-Magnesium 500-250 MG Tabs   Take 1 tablet by mouth daily.      carvedilol 10 MG 24 hr capsule   Commonly known as: COREG CR   Take 1 capsule  (10 mg total) by mouth daily.      co-enzyme Q-10 30 MG capsule   Take 30 mg by mouth 3 (three) times daily.      colchicine 0.6 MG tablet   Take 0.6 mg by mouth daily as needed. For gout flareups.      diphenhydrAMINE 25 mg capsule   Commonly known as: BENADRYL   Take 25 mg by mouth every 6 (six) hours as needed. For allergies.      famotidine 40 MG tablet   Commonly known as: PEPCID   Take 1 tablet (40 mg total) by mouth 2 (two) times daily.      fish oil-omega-3 fatty acids 1000 MG capsule   Take 2 g by mouth daily.      hydrALAZINE 50 MG tablet   Commonly known as: APRESOLINE   Take 1 tablet (50 mg total) by mouth every 8 (eight) hours.      lisinopril 20 MG tablet   Commonly known as: PRINIVIL,ZESTRIL   Take 1 tablet (20 mg total) by mouth 2 (two) times daily.      multivitamin with minerals Tabs   Take 1 tablet by mouth daily.      nitroGLYCERIN 0.4 MG SL tablet   Commonly known as: NITROSTAT   Place 0.4 mg under the tongue every 5 (five) minutes as needed. For chest pain      ondansetron 4 MG disintegrating tablet   Commonly known as: ZOFRAN-ODT   Take 4 mg by mouth every 4 (four) hours as needed. For nausea      oxyCODONE-acetaminophen 5-325 MG per tablet   Commonly known as: PERCOCET/ROXICET   Take 1 tablet by mouth every 6 (six) hours as needed. For pain      potassium chloride SA 20 MEQ tablet   Commonly known as: K-DUR,KLOR-CON   Take 20 mEq by mouth daily.      spironolactone 12.5 mg Tabs   Commonly known as: ALDACTONE   Take 0.5 tablets (12.5 mg total) by mouth daily.      traMADol 50 MG tablet   Commonly known as: ULTRAM   Take 50 mg by mouth every 6 (six) hours as needed. For pain.      vitamin C 500 MG tablet   Commonly known as: ASCORBIC ACID   Take 500 mg by mouth daily.      warfarin 5 MG tablet   Commonly known as: COUMADIN   Take 5-7.5 mg by mouth daily. Take 5mg  Sunday, Tuesday, Thursday. And take 7.5mg  Monday, Wednesday, friday  Disposition   The patient will be discharged in stable condition to home. Discharge Orders    Future Appointments: Provider: Department: Dept Phone: Center:   10/11/2012 9:50 AM Beatrice Lecher, PA [facility] Main [facility] [place]) [phone] [email or web address]   10/20/2012 11:30 AM [facility] [address] [place] [facility] [phone] [email or web address]   10/22/2012 10:00 AM [facility] [phone] None     Future Orders Please Complete By Expires   Diet - low sodium heart healthy      Increase activity slowly      (HEART FAILURE PATIENTS) Call MD:  Anytime you have any of the following symptoms: 1) 3 pound weight gain in 24 hours or 5 pounds in 1 week 2) shortness of breath, with or without a dry hacking cough 3) swelling in the hands, feet or stomach 4) if you have to sleep on extra pillows at night in order to breathe.      Heart Failure patients record your daily weight using the same scale at the same time of day      ACE Inhibitor / ARB already ordered        Follow-up Information    Follow up with Arvilla Meres, MD. On 09/30/2012. (10:30 Garage Code 8000)    Contact information:   [address] [place] [address] [phone]       Follow up with [facility]. On 09/28/2012. (at 12:00)    Contact information:   [address], [address] [place] [address] [phone]      Follow up with Tereso Newcomer, PA. On 10/11/2012. (9:50)    Contact information:   [address] [place] [address] [phone]            Duration of Discharge Encounter: Greater than 35 minutes   Signed, Arvilla Meres , MD 09/23/2012

## 2012-10-05 ENCOUNTER — Telehealth (HOSPITAL_COMMUNITY): Payer: Self-pay | Admitting: *Deleted

## 2012-10-05 ENCOUNTER — Ambulatory Visit: Payer: Medicare Other | Admitting: Internal Medicine

## 2012-10-05 ENCOUNTER — Ambulatory Visit: Payer: Self-pay | Admitting: Cardiovascular Disease

## 2012-10-05 DIAGNOSIS — I129 Hypertensive chronic kidney disease with stage 1 through stage 4 chronic kidney disease, or unspecified chronic kidney disease: Secondary | ICD-10-CM | POA: Diagnosis not present

## 2012-10-05 DIAGNOSIS — I4891 Unspecified atrial fibrillation: Secondary | ICD-10-CM | POA: Diagnosis not present

## 2012-10-05 DIAGNOSIS — N189 Chronic kidney disease, unspecified: Secondary | ICD-10-CM | POA: Diagnosis not present

## 2012-10-05 DIAGNOSIS — Z87440 Personal history of urinary (tract) infections: Secondary | ICD-10-CM | POA: Diagnosis not present

## 2012-10-05 DIAGNOSIS — I509 Heart failure, unspecified: Secondary | ICD-10-CM | POA: Diagnosis not present

## 2012-10-05 DIAGNOSIS — I5022 Chronic systolic (congestive) heart failure: Secondary | ICD-10-CM

## 2012-10-05 DIAGNOSIS — I4892 Unspecified atrial flutter: Secondary | ICD-10-CM

## 2012-10-05 NOTE — Telephone Encounter (Signed)
Message copied by Noralee Space on Tue Oct 05, 2012  4:31 PM ------      Message from: Arvilla Meres R      Created: Mon Oct 04, 2012 10:50 AM       Mildly pre-renal. Please recheck in 1 week.

## 2012-10-08 DIAGNOSIS — I509 Heart failure, unspecified: Secondary | ICD-10-CM | POA: Diagnosis not present

## 2012-10-08 DIAGNOSIS — N189 Chronic kidney disease, unspecified: Secondary | ICD-10-CM | POA: Diagnosis not present

## 2012-10-08 DIAGNOSIS — I4891 Unspecified atrial fibrillation: Secondary | ICD-10-CM | POA: Diagnosis not present

## 2012-10-08 DIAGNOSIS — Z87440 Personal history of urinary (tract) infections: Secondary | ICD-10-CM | POA: Diagnosis not present

## 2012-10-08 DIAGNOSIS — I129 Hypertensive chronic kidney disease with stage 1 through stage 4 chronic kidney disease, or unspecified chronic kidney disease: Secondary | ICD-10-CM | POA: Diagnosis not present

## 2012-10-11 ENCOUNTER — Encounter: Payer: Self-pay | Admitting: Physician Assistant

## 2012-10-11 ENCOUNTER — Ambulatory Visit (INDEPENDENT_AMBULATORY_CARE_PROVIDER_SITE_OTHER): Payer: Medicare Other | Admitting: Physician Assistant

## 2012-10-11 ENCOUNTER — Other Ambulatory Visit (INDEPENDENT_AMBULATORY_CARE_PROVIDER_SITE_OTHER): Payer: Medicare Other

## 2012-10-11 VITALS — BP 118/81 | HR 83 | Ht 68.0 in | Wt 158.4 lb

## 2012-10-11 DIAGNOSIS — I5022 Chronic systolic (congestive) heart failure: Secondary | ICD-10-CM

## 2012-10-11 DIAGNOSIS — I4892 Unspecified atrial flutter: Secondary | ICD-10-CM

## 2012-10-11 DIAGNOSIS — N259 Disorder resulting from impaired renal tubular function, unspecified: Secondary | ICD-10-CM

## 2012-10-11 DIAGNOSIS — I2510 Atherosclerotic heart disease of native coronary artery without angina pectoris: Secondary | ICD-10-CM | POA: Diagnosis not present

## 2012-10-11 LAB — BASIC METABOLIC PANEL
BUN: 25 mg/dL — ABNORMAL HIGH (ref 6–23)
Creatinine, Ser: 1.3 mg/dL (ref 0.4–1.5)
GFR: 59.95 mL/min — ABNORMAL LOW (ref >60.00)

## 2012-10-11 NOTE — Patient Instructions (Addendum)
**Note De-identified Cebastian Neis Obfuscation** Your physician recommends that you return for lab work in: today  Your physician recommends that you schedule a follow-up appointment in: 3 months  

## 2012-10-11 NOTE — Progress Notes (Signed)
[address], [address] [place], [place]  [address] Phone: [phone], Fax:  [phone]  Date:  10/11/2012   Name:  [name]   DOB:  Jun 22, 1946   MRN:  [id number]  PCP:  Crist Fat, MD  Primary Cardiologist:  Dr. [name]  Primary Electrophysiologist:  Dr. Sherryl Manges CHF:   Dr. Arvilla Meres    History of Present Illness: [name] is a 66 y.o. male who returns for follow up after recent admission to the hospital for a/c systolic CHF in the setting of low output.  He has a hx of CAD (s/p DES to LCx '03, residual total occlusion of RCA w/ collaterals, normal myoview 2012), chronic systolic CHF, ICM (EF 25%, s/p Medtronic BiV ICD), Atrial Fib/Flutter (s/p DCCV, on coumadin), HTN, HLD, CKD, hyponatremia, and Seizure disorder.    He was admitted 10/31-11/7 with a/c systolic CHF with low output. He initially presented with abdominal discomfort and had previously had an extensive GI workup. This was largely negative except for mesenteric atherosclerosis. Symptoms were concerning for low output heart failure and he was admitted to the hospital. He was initially diuresed with minimal improvement. RHC demonstrated low output with PCWP 38, CO 3.8, CI 2.0. He was followed by Dr. Gala Romney with the CHF service. He was placed on milrinone and IV Lasix. He diuresed 14 pounds and his discharge weight was 155. He has followed up in the heart failure clinic since discharge. His Lasix was reduced 2/2 prerenal azotemia on labwork. He has a CPX test pending and followup with Dr. Gala Romney thereafter. Since discharge, he is doing well. He reports class II dyspnea. He denies significant orthopnea or PND. Denies pedal edema. He denies chest discomfort. He denies syncope. He denies any ICD shocks.  Labs (11/13):    K 4.5, creatinine 1.33, Hgb 11.9  Wt Readings from Last 3 Encounters:  10/11/12 158 lb 6.4 oz (71.85 kg)  09/30/12 154 lb 12.8 oz (70.217 kg)  09/23/12 155  lb 6.8 oz (70.5 kg)     Past Medical History  Diagnosis Date  . Ischemic cardiomyopathy     Myoview 02/2011  EF 28%  Coronary artery disease  (total occlusion of the right coronary artery, left-to-right )  . Atrial flutter     s/p TEE guided cardioversion  . Nonsustained ventricular tachycardia   . LBBB (left bundle branch block)   . Chronic drug-induced interstitial lung disorders   . Acute on chronic renal insufficiency   . Coronary artery disease     total occlusion of the right coronary artery, left to right collaterals.  He had Cypher stenting to the circumflex in  July 2003  . Seizure disorder   . AICD (automatic cardioverter/defibrillator) present     Medtronic CRT  . Hyponatremia   . 6949-lead     replaced 02/2011  . Hyperlipemia   . Hypertension   . Fatty liver     CT  . Family hx of colon cancer     2 SISTERS  . CHF (congestive heart failure)   . Asthma   . COPD (chronic obstructive pulmonary disease)     " very MILD "  . Headache     Current Outpatient Prescriptions  Medication Sig Dispense Refill  . Calcium-Magnesium 500-250 MG TABS Take 1 tablet by mouth daily.       . carvedilol (COREG CR) 10 MG 24 hr capsule Take 1 capsule (10 mg total) by mouth daily.  30 capsule  3  . co-enzyme Q-10 30 MG capsule Take 30 mg by mouth 3 (three) times daily.        . colchicine 0.6 MG tablet Take 0.6 mg by mouth daily as needed. For gout flareups.      . digoxin (LANOXIN) 0.125 MG tablet Take 1 tablet (0.125 mg total) by mouth every other day.  15 tablet  10  . diphenhydrAMINE (BENADRYL) 25 mg capsule Take 25 mg by mouth every 6 (six) hours as needed. For allergies.      . famotidine (PEPCID) 40 MG tablet Take 1 tablet (40 mg total) by mouth 2 (two) times daily.  60 tablet  1  . famotidine (PEPCID) 40 MG tablet Take 1 tablet (40 mg total) by mouth 2 (two) times daily.  60 tablet  6  . fish oil-omega-3 fatty acids 1000 MG capsule Take 2 g by mouth daily.      . furosemide  (LASIX) 40 MG tablet Take 1 tablet (40 mg total) by mouth daily.  60 tablet  6  . hydrALAZINE (APRESOLINE) 50 MG tablet Take 1 tablet (50 mg total) by mouth every 8 (eight) hours.  90 tablet  3  . lisinopril (PRINIVIL,ZESTRIL) 20 MG tablet Take 1 tablet (20 mg total) by mouth 2 (two) times daily.  60 tablet  6  . Multiple Vitamin (MULTIVITAMIN WITH MINERALS) TABS Take 1 tablet by mouth daily.      . nitroGLYCERIN (NITROSTAT) 0.4 MG SL tablet Place 0.4 mg under the tongue every 5 (five) minutes as needed. For chest pain      . ondansetron (ZOFRAN-ODT) 4 MG disintegrating tablet Take 4 mg by mouth every 4 (four) hours as needed. For nausea      . oxyCODONE-acetaminophen (PERCOCET/ROXICET) 5-325 MG per tablet Take 1 tablet by mouth every 6 (six) hours as needed. For pain      . potassium chloride SA (K-DUR,KLOR-CON) 20 MEQ tablet Take 20 mEq by mouth daily.      Marland Kitchen spironolactone (ALDACTONE) 12.5 mg TABS Take 0.5 tablets (12.5 mg total) by mouth daily.  30 tablet  3  . traMADol (ULTRAM) 50 MG tablet Take 50 mg by mouth every 6 (six) hours as needed. For pain.      . vitamin C (ASCORBIC ACID) 500 MG tablet Take 500 mg by mouth daily.      Marland Kitchen warfarin (COUMADIN) 5 MG tablet Take 5-7.5 mg by mouth daily. Take 5mg  Sunday, Tuesday, Thursday. And take 7.5mg  Monday, Wednesday, Friday,        Allergies: Allergies  Allergen Reactions  . Ace Inhibitors   . Amlodipine Besylate     Numbness to lips; made legs give out  . Codeine Itching  . Corzide (Nadolol-Bendroflumethiazide)   . Diovan (Valsartan)   . Flagyl (Metronidazole)   . Norvasc (Amlodipine Besylate)     Numbness to lips; made legs give out  . Omeprazole     Shut my kidneys down  . Other     Perfumes smells; paint smells; formaldehyde smells  . Penicillins   . Shellfish Allergy   . Latex Itching and Rash    Social History:  The patient  reports that he has never smoked. He has never used smokeless tobacco. He reports that he drinks  alcohol. He reports that he does not use illicit drugs.   ROS:  Please see the history of present illness.    All other systems reviewed and negative.   PHYSICAL EXAM:  VS:  BP 118/81  Pulse 83  Ht 5\' 8"  (1.727 m)  Wt 158 lb 6.4 oz (71.85 kg)  BMI 24.08 kg/m2 Well nourished, well developed, in no acute distress HEENT: normal Neck: no JVD Cardiac:  normal S1, S2; RRR; no murmur Lungs:  clear to auscultation bilaterally, no wheezing, rhonchi or rales Abd: soft, nontender, no hepatomegaly Ext: no edema Skin: warm and dry Neuro:  CNs 2-12 intact, no focal abnormalities noted  EKG:  AV paced, HR 83, PVCs     ASSESSMENT AND PLAN:  1. Chronic Systolic CHF:   Volume appears stable.  He is on a good medical regimen.  He has close follow up with Dr. Arvilla Meres in the CHF clinic.  CPX test is pending.  He will follow up with Dr. Gala Romney as planned.  Get BMET today to follow up on renal function and K+.    2. Coronary Artery Disease:   Stable.  No angina.  Continue current Rx.  3. Hypertension:   Controlled.  Continue current therapy.   4. Atrial Flutter:   Maintaining NSR.  Coumadin managed by our coumadin clininc.  5. Chronic Kidney Disease:   Repeat BMET pending today.  6. S/p BiV-ICD:   Follow up with Dr. Sherryl Manges as planned.   7. Disposition:   Follow up with Dr. [name] in 3 mos.   Signed, Tereso Newcomer, [name]  10:40 AM 10/11/2012

## 2012-10-12 ENCOUNTER — Ambulatory Visit: Payer: Self-pay | Admitting: Internal Medicine

## 2012-10-12 DIAGNOSIS — N189 Chronic kidney disease, unspecified: Secondary | ICD-10-CM | POA: Diagnosis not present

## 2012-10-12 DIAGNOSIS — I4891 Unspecified atrial fibrillation: Secondary | ICD-10-CM | POA: Diagnosis not present

## 2012-10-12 DIAGNOSIS — I4892 Unspecified atrial flutter: Secondary | ICD-10-CM

## 2012-10-12 DIAGNOSIS — Z7901 Long term (current) use of anticoagulants: Secondary | ICD-10-CM

## 2012-10-12 DIAGNOSIS — I509 Heart failure, unspecified: Secondary | ICD-10-CM | POA: Diagnosis not present

## 2012-10-12 DIAGNOSIS — Z87440 Personal history of urinary (tract) infections: Secondary | ICD-10-CM | POA: Diagnosis not present

## 2012-10-12 DIAGNOSIS — I129 Hypertensive chronic kidney disease with stage 1 through stage 4 chronic kidney disease, or unspecified chronic kidney disease: Secondary | ICD-10-CM | POA: Diagnosis not present

## 2012-10-12 LAB — POCT INR: INR: 1.8

## 2012-10-13 DIAGNOSIS — Z87440 Personal history of urinary (tract) infections: Secondary | ICD-10-CM | POA: Diagnosis not present

## 2012-10-13 DIAGNOSIS — N189 Chronic kidney disease, unspecified: Secondary | ICD-10-CM | POA: Diagnosis not present

## 2012-10-13 DIAGNOSIS — I509 Heart failure, unspecified: Secondary | ICD-10-CM | POA: Diagnosis not present

## 2012-10-13 DIAGNOSIS — I4891 Unspecified atrial fibrillation: Secondary | ICD-10-CM | POA: Diagnosis not present

## 2012-10-13 DIAGNOSIS — I129 Hypertensive chronic kidney disease with stage 1 through stage 4 chronic kidney disease, or unspecified chronic kidney disease: Secondary | ICD-10-CM | POA: Diagnosis not present

## 2012-10-15 DIAGNOSIS — Z87440 Personal history of urinary (tract) infections: Secondary | ICD-10-CM | POA: Diagnosis not present

## 2012-10-15 DIAGNOSIS — I509 Heart failure, unspecified: Secondary | ICD-10-CM | POA: Diagnosis not present

## 2012-10-15 DIAGNOSIS — I129 Hypertensive chronic kidney disease with stage 1 through stage 4 chronic kidney disease, or unspecified chronic kidney disease: Secondary | ICD-10-CM | POA: Diagnosis not present

## 2012-10-15 DIAGNOSIS — I4891 Unspecified atrial fibrillation: Secondary | ICD-10-CM | POA: Diagnosis not present

## 2012-10-15 DIAGNOSIS — N189 Chronic kidney disease, unspecified: Secondary | ICD-10-CM | POA: Diagnosis not present

## 2012-10-20 ENCOUNTER — Ambulatory Visit (HOSPITAL_COMMUNITY): Payer: Medicare Other | Attending: Internal Medicine

## 2012-10-20 DIAGNOSIS — Z7901 Long term (current) use of anticoagulants: Secondary | ICD-10-CM | POA: Diagnosis not present

## 2012-10-20 DIAGNOSIS — I5022 Chronic systolic (congestive) heart failure: Secondary | ICD-10-CM | POA: Diagnosis not present

## 2012-10-20 DIAGNOSIS — R0602 Shortness of breath: Secondary | ICD-10-CM

## 2012-10-20 DIAGNOSIS — I4891 Unspecified atrial fibrillation: Secondary | ICD-10-CM | POA: Diagnosis not present

## 2012-10-21 DIAGNOSIS — I129 Hypertensive chronic kidney disease with stage 1 through stage 4 chronic kidney disease, or unspecified chronic kidney disease: Secondary | ICD-10-CM | POA: Diagnosis not present

## 2012-10-21 DIAGNOSIS — I4891 Unspecified atrial fibrillation: Secondary | ICD-10-CM | POA: Diagnosis not present

## 2012-10-21 DIAGNOSIS — N189 Chronic kidney disease, unspecified: Secondary | ICD-10-CM | POA: Diagnosis not present

## 2012-10-21 DIAGNOSIS — I509 Heart failure, unspecified: Secondary | ICD-10-CM | POA: Diagnosis not present

## 2012-10-21 DIAGNOSIS — Z87440 Personal history of urinary (tract) infections: Secondary | ICD-10-CM | POA: Diagnosis not present

## 2012-10-22 ENCOUNTER — Ambulatory Visit (HOSPITAL_COMMUNITY)
Admission: RE | Admit: 2012-10-22 | Discharge: 2012-10-22 | Disposition: A | Discharge status: Home / Self Care | Payer: Medicare Other | Source: Ambulatory Visit | Attending: Internal Medicine | Admitting: Internal Medicine

## 2012-10-22 VITALS — BP 110/68 | HR 67 | Wt 158.5 lb

## 2012-10-22 DIAGNOSIS — I5022 Chronic systolic (congestive) heart failure: Secondary | ICD-10-CM

## 2012-10-22 MED ORDER — CARVEDILOL PHOSPHATE ER 20 MG PO CP24: 20.0000 mg | ORAL_CAPSULE | Freq: Every day | ORAL | Status: DC

## 2012-10-22 NOTE — Assessment & Plan Note (Addendum)
He is markedly improved from a functional standpoint. Wife reports he is sleeping better than he has in a long time with a marked reduction in abdomina symptoms. CPX results reviewed with them at length and show only a mild HF limitation. Thus we will continue with aggressive medical therapy at this point and defer any further work-up for advanced HF therapies at this point. Will titrate Coreg CR to 20 mg daily. Need to review ectopy on CPX test with Dr. Graciela Husbands though I suspect increasing Coreg is the right way to proceed for this. I discussed with Dr. Antoine Poche, F/u in 1 month with HF clinic and then return to Dr. Antoine Poche for alternating visits.

## 2012-10-22 NOTE — Patient Instructions (Addendum)
Follow up in one month  Take an extra Lasix if your weight is > 155 pounds  Take carvedilol CR 20 mg daily  Do the following things EVERYDAY: 1) Weigh yourself in the morning before breakfast. Write it down and keep it in a log. 2) Take your medicines as prescribed 3) Eat low salt foods-Limit salt (sodium) to 2000 mg per day.  4) Stay as active as you can everyday 5) Limit all fluids for the day to less than 2 liters

## 2012-10-23 NOTE — Progress Notes (Signed)
Patient ID: TEL HEVIA, male   DOB: [birth year], 66 y.o.   MRN: [id number]  Primary cardiologist: Dr. Antoine Poche  HPI: 66 y.o. male w/ PMHx significant for CAD (s/p DES to LCx '03, residual total occlusion of RCA w/ collaterals, normal myoview 2012), ICM (EF 25%, s/p Medtronic BiV ICD), Atrial Fib/Flutter (s/p DCCV, on coumadin), HTN, HLD, CKD, hyponatremia, and Seizure disorder  Admitted to [facility] 09/16/12 for ongoing ab pain thought to be related tho HF. GI w/u to date has been negative. CT scan showed diffuse mesenteric calcification but pain not felt to be intestinal claudication. D/C from [facility] 09/23/12. Presented with NYHA III-IV symptoms. Required Milrionone due to low output heart failure. Diuresed 14 pounds. Discharge weight 155 pounds. Advanced Heart Therapies discussed during admission which included LVAD, home inotropes, or hospice. He was somewhat reluctant to consider. RHC 09/17/2012  RA = 22  RV = 67/17/22  PA = 65/41 (50)  PCW = 38 (v-waves to 45)  Fick cardiac output/index = 3.8/2.0  Thermo CO/CI = 2.3/1.2  PVR = 5.0 woods  FA sat = 92%  PA sat = 51%, 52%   CPX 12/4 /13 Peak VO2: 17.1 % predicted peak VO2: 59.7% VE/VCO2 slope: 25.7 OUES: 2.13 Peak RER: 1.02 Ventilatory Threshold: 15 % predicted peak VO2: 52.4% Test was stopped early due excessive ventricular ectopy, afib with RVR and significant fall in BP.  He returns for follow up. Last visit lasix was reduced to 40 mg daily. Denies SOB/PND/Orthopnea. GI symptoms improved. Weight at home 151-155 pounds. He has had one additional lasix for weight 155.8 pounds. SBP at home 118-140. Complaint with all medications. Denies bleeding problems.   ROS: All systems negative except as listed in HPI, PMH and Problem List.  Past Medical History  Diagnosis Date  . Ischemic cardiomyopathy     Myoview 02/2011  EF 28%  Coronary artery disease  (total occlusion of the right coronary artery, left-to-right )  . Atrial flutter     s/p TEE  guided cardioversion  . Nonsustained ventricular tachycardia   . LBBB (left bundle branch block)   . Chronic drug-induced interstitial lung disorders   . Acute on chronic renal insufficiency   . Coronary artery disease     total occlusion of the right coronary artery, left to right collaterals.  He had Cypher stenting to the circumflex in  July 2003  . Seizure disorder   . AICD (automatic cardioverter/defibrillator) present     Medtronic CRT  . Hyponatremia   . 6949-lead     replaced 02/2011  . Hyperlipemia   . Hypertension   . Fatty liver     CT  . Family hx of colon cancer     2 SISTERS  . CHF (congestive heart failure)   . Asthma   . COPD (chronic obstructive pulmonary disease)     " very MILD "  . Headache     Current Outpatient Prescriptions  Medication Sig Dispense Refill  . Calcium-Magnesium 500-250 MG TABS Take 1 tablet by mouth daily.       . carvedilol (COREG CR) 20 MG 24 hr capsule Take 1 capsule (20 mg total) by mouth daily.  30 capsule  3  . co-enzyme Q-10 30 MG capsule Take 30 mg by mouth 3 (three) times daily.        . colchicine 0.6 MG tablet Take 0.6 mg by mouth daily as needed. For gout flareups.      . digoxin (LANOXIN) 0.125 MG  tablet Take 1 tablet (0.125 mg total) by mouth every other day.  15 tablet  10  . diphenhydrAMINE (BENADRYL) 25 mg capsule Take 25 mg by mouth every 6 (six) hours as needed. For allergies.      . famotidine (PEPCID) 40 MG tablet Take 1 tablet (40 mg total) by mouth 2 (two) times daily.  60 tablet  1  . fish oil-omega-3 fatty acids 1000 MG capsule Take 2 g by mouth daily.      . furosemide (LASIX) 40 MG tablet Take 1 tablet (40 mg total) by mouth daily.  60 tablet  6  . hydrALAZINE (APRESOLINE) 50 MG tablet Take 1 tablet (50 mg total) by mouth every 8 (eight) hours.  90 tablet  3  . lisinopril (PRINIVIL,ZESTRIL) 20 MG tablet Take 1 tablet (20 mg total) by mouth 2 (two) times daily.  60 tablet  6  . Multiple Vitamin (MULTIVITAMIN WITH  MINERALS) TABS Take 1 tablet by mouth daily.      . nitroGLYCERIN (NITROSTAT) 0.4 MG SL tablet Place 0.4 mg under the tongue every 5 (five) minutes as needed. For chest pain      . ondansetron (ZOFRAN-ODT) 4 MG disintegrating tablet Take 4 mg by mouth every 4 (four) hours as needed. For nausea      . oxyCODONE-acetaminophen (PERCOCET/ROXICET) 5-325 MG per tablet Take 1 tablet by mouth every 6 (six) hours as needed. For pain      . potassium chloride SA (K-DUR,KLOR-CON) 20 MEQ tablet Take 20 mEq by mouth daily.      Marland Kitchen spironolactone (ALDACTONE) 12.5 mg TABS Take 0.5 tablets (12.5 mg total) by mouth daily.  30 tablet  3  . traMADol (ULTRAM) 50 MG tablet Take 50 mg by mouth every 6 (six) hours as needed. For pain.      . vitamin C (ASCORBIC ACID) 500 MG tablet Take 500 mg by mouth daily.      Marland Kitchen warfarin (COUMADIN) 5 MG tablet Take 5-5.5 mg by mouth daily. Take 5mg  Sunday, Tuesday, Thursday. And take 7.5mg  Monday, Wednesday, Friday,         PHYSICAL EXAM: Filed Vitals:   10/22/12 1001  BP: 110/68  Pulse: 67   General:  Thin. Well appearing. No resp difficulty HEENT: normal Neck: supple. JVP flat. Carotids 2+ bilaterally; no bruits. No lymphadenopathy or thryomegaly appreciated. Cor: PMI normal. Regular rate & rhythm. No rubs, gallops or murmurs. Lungs: clear Abdomen: soft, nontender, nondistended. No hepatosplenomegaly. No bruits or masses. Good bowel sounds. Extremities: no cyanosis, clubbing, rash, edema Neuro: alert & orientedx3, cranial nerves grossly intact. Moves all 4 extremities w/o difficulty. Affect pleasant.     ASSESSMENT & PLAN:

## 2012-10-26 ENCOUNTER — Ambulatory Visit: Payer: Self-pay | Admitting: Cardiovascular Disease

## 2012-10-26 DIAGNOSIS — I4891 Unspecified atrial fibrillation: Secondary | ICD-10-CM | POA: Diagnosis not present

## 2012-10-26 DIAGNOSIS — I509 Heart failure, unspecified: Secondary | ICD-10-CM | POA: Diagnosis not present

## 2012-10-26 DIAGNOSIS — I129 Hypertensive chronic kidney disease with stage 1 through stage 4 chronic kidney disease, or unspecified chronic kidney disease: Secondary | ICD-10-CM | POA: Diagnosis not present

## 2012-10-26 DIAGNOSIS — N189 Chronic kidney disease, unspecified: Secondary | ICD-10-CM | POA: Diagnosis not present

## 2012-10-26 DIAGNOSIS — Z87440 Personal history of urinary (tract) infections: Secondary | ICD-10-CM | POA: Diagnosis not present

## 2012-10-26 DIAGNOSIS — Z7901 Long term (current) use of anticoagulants: Secondary | ICD-10-CM

## 2012-10-26 DIAGNOSIS — I4892 Unspecified atrial flutter: Secondary | ICD-10-CM

## 2012-11-04 ENCOUNTER — Telehealth: Payer: Self-pay | Admitting: Internal Medicine

## 2012-11-04 NOTE — Telephone Encounter (Signed)
11-04-12 LMM @ 333pm, pt needs past due fu appt with klein/mt

## 2012-11-05 ENCOUNTER — Telehealth (HOSPITAL_COMMUNITY): Payer: Self-pay | Admitting: Cardiology

## 2012-11-05 NOTE — Telephone Encounter (Signed)
Since increasing coreg from 10 mg to 20 mg weight has slowly been increasing. Yesterday 155.5 Today       157   Per VO Nicki Bradley,PA Double lasix 40 mg to bid and call office on Monday with weight   Pt aware and voiced understanding

## 2012-11-09 ENCOUNTER — Ambulatory Visit: Payer: Self-pay | Admitting: Cardiology

## 2012-11-09 DIAGNOSIS — I4892 Unspecified atrial flutter: Secondary | ICD-10-CM

## 2012-11-09 DIAGNOSIS — I509 Heart failure, unspecified: Secondary | ICD-10-CM | POA: Diagnosis not present

## 2012-11-09 DIAGNOSIS — Z87440 Personal history of urinary (tract) infections: Secondary | ICD-10-CM | POA: Diagnosis not present

## 2012-11-09 DIAGNOSIS — I4891 Unspecified atrial fibrillation: Secondary | ICD-10-CM

## 2012-11-09 DIAGNOSIS — N189 Chronic kidney disease, unspecified: Secondary | ICD-10-CM | POA: Diagnosis not present

## 2012-11-09 DIAGNOSIS — I129 Hypertensive chronic kidney disease with stage 1 through stage 4 chronic kidney disease, or unspecified chronic kidney disease: Secondary | ICD-10-CM | POA: Diagnosis not present

## 2012-11-09 DIAGNOSIS — Z7901 Long term (current) use of anticoagulants: Secondary | ICD-10-CM

## 2012-11-09 NOTE — Discharge Summary (Signed)
Dolores Patty, MD Physician Signed Heart Failure Discharge Planning 09/23/2012 2:43 PM  Advanced Heart Failure Team    Discharge Summary       Patient ID: [name]  MRN: [id number], DOB/AGE: [birth year] 68 y.o.  Admit date: 09/16/2012  D/C date:     09/23/2012      Primary Discharge Diagnoses:  1. Chronic abdominal pain suspect due to low-output HF   2. Acute on chronic Systolic CHF with low output   3. Ischemic cardiomyopathy, EF 25% s/p BiV ICD   4. A.fib/flutter on chronic coumadin   5. Acute on Chronic Renal Failure   6. Hyponatremia   7. Coronary Artery Disease   8. Hypertension   9. Hyperlipidemia   10. Anxiety   11. NSVT   12. Hypomagnesemia         Hospital Course: 67 y.o. male w/ PMHx significant for CAD (s/p DES to LCx '03, residual total occlusion of RCA w/ collaterals, normal myoview 2012), ICM (EF 25%, s/p Medtronic BiV ICD), Atrial Fib/Flutter (s/p DCCV, on coumadin), HTN, HLD, CKD, hyponatremia, and Seizure disorder.    He presented to [facility] on 09/16/2012 with complaints of abdominal discomfort. Has had extensive GI work-up which has been negative except for mesenteric atherosclerosis. He was later admitted to see if HF cause of sx.  Pertinent admission labs included Pro BNP 9832, Potassium 4.5, and Creatinine 1.6. Initially he was diuresed with Lasix 40 mg IV bid but he had minimal improvement. At that point a RHC was performed which revealed low output HF. Post cath,  swan ganz catheter remained in place for ongoing hemodynamic monitoring. He was also started on Milrinone drip and IV lasix was increased.  He continued to diurese and his SVR started to trend down with up titration of afterload reduction medications.  On 09/21/12, Milrinone was weaned off.  CO-OX remained stable off Milrinone. He was diuresed 14 pounds and transitioned to po lasix. Offered to change to carvedilol 3.125 mg twice a day but he preferred to continue on carvedilol  Cr 10 mg daily.  Swan ganz catheter removed 09/22/12. PICC removed 09/23/12. Discharge weight 155 pounds.    Lengthy discussions occurred with with him and his wife about stage D heart failure. I discussed advanced therapies with him as well as home inotropes and Hospice. He is not sure he is interested in advanced therapies but now at least willing to consider. He will continue to be followed closely with Dr Antoine Poche and in the HF clinic at [facility]. He will be followed by [facility] for [facility] and HHPT.    At the time of discharge he was medically stable and ambulating without dyspnea.        RHC 09/17/2012 RA = 22   RV = 67/17/22   PA = 65/41 (50)   PCW = 38 (v-waves to 45)   Fick cardiac output/index = 3.8/2.0   Thermo CO/CI = 2.3/1.2   PVR = 5.0 woods   FA sat = 92%   PA sat = 51%, 52%              Discharge Weight Range:   Admit Weight: 169 pounds                       Discharge Weight:  155 pounds Discharge Vitals: Blood pressure 104/61, pulse 70, temperature 97.1 F (36.2 C), temperature source Oral, resp. rate 20, height 5\' 8"  (1.727  m), weight 70.5 kg (155 lb 6.8 oz), SpO2 96.00%.   Labs: Lab Results   Component  Value  Date     WBC  4.9  09/16/2012     HGB  11.9*  09/16/2012     HCT  36.3*  09/16/2012     MCV  99.7  09/16/2012     PLT  231.0  09/16/2012    Lab  09/30/12 1128   NA  133*   K  4.5   CL  98   CO2  24   BUN  30*   CREATININE  1.33   CALCIUM  9.6   PROT  --   BILITOT  --   ALKPHOS  --   ALT  --   AST  --   GLUCOSE  109*    Lab Results   Component  Value  Date     CHOL  169  09/13/2010     HDL  29*  09/13/2010     LDLCALC  125*  09/13/2010     TRIG  77  09/13/2010    BNP (last 3 results) Basename  09/17/12 0153   PROBNP  9832.0*       Diagnostic Studies/Procedures      No results found.    Discharge Medications          Medication List        As of 10/04/2012  2:43 PM      STOP taking these  medications              furosemide 40 MG tablet     Commonly known as: LASIX          TAKE these medications              Calcium-Magnesium 500-250 MG Tabs     Take 1 tablet by mouth daily.           carvedilol 10 MG 24 hr capsule     Commonly known as: COREG CR     Take 1 capsule (10 mg total) by mouth daily.           co-enzyme Q-10 30 MG capsule     Take 30 mg by mouth 3 (three) times daily.           colchicine 0.6 MG tablet     Take 0.6 mg by mouth daily as needed. For gout flareups.           diphenhydrAMINE 25 mg capsule     Commonly known as: BENADRYL     Take 25 mg by mouth every 6 (six) hours as needed. For allergies.           famotidine 40 MG tablet     Commonly known as: PEPCID     Take 1 tablet (40 mg total) by mouth 2 (two) times daily.           fish oil-omega-3 fatty acids 1000 MG capsule     Take 2 g by mouth daily.           hydrALAZINE 50 MG tablet     Commonly known as: APRESOLINE     Take 1 tablet (50 mg total) by mouth every 8 (eight) hours.           lisinopril 20 MG tablet     Commonly known as: PRINIVIL,ZESTRIL     Take 1 tablet (20 mg total) by mouth 2 (two) times daily.  multivitamin with minerals Tabs     Take 1 tablet by mouth daily.           nitroGLYCERIN 0.4 MG SL tablet     Commonly known as: NITROSTAT     Place 0.4 mg under the tongue every 5 (five) minutes as needed. For chest pain           ondansetron 4 MG disintegrating tablet     Commonly known as: ZOFRAN-ODT     Take 4 mg by mouth every 4 (four) hours as needed. For nausea           oxyCODONE-acetaminophen 5-325 MG per tablet     Commonly known as: PERCOCET/ROXICET     Take 1 tablet by mouth every 6 (six) hours as needed. For pain           potassium chloride SA 20 MEQ tablet     Commonly known as: K-DUR,KLOR-CON     Take 20 mEq by mouth daily.           spironolactone 12.5 mg Tabs     Commonly known as: ALDACTONE     Take 0.5 tablets (12.5  mg total) by mouth daily.           traMADol 50 MG tablet     Commonly known as: ULTRAM     Take 50 mg by mouth every 6 (six) hours as needed. For pain.           vitamin C 500 MG tablet     Commonly known as: ASCORBIC ACID     Take 500 mg by mouth daily.           warfarin 5 MG tablet     Commonly known as: COUMADIN     Take 5-7.5 mg by mouth daily. Take 5mg  Sunday, Tuesday, Thursday. And take 7.5mg Monday, Wednesday, friday                Disposition       The patient will be discharged in stable condition to home. Discharge Orders      Future Appointments:  Provider:  Department:  Dept Phone:  Center:     10/11/2012 9:50 AM  Scott T Weaver, PA  [facility] ([place])  [phone]  [email or web address]     10/20/2012 11:30 AM  [facility] [address]  [facility]  [phone]  [email or web address]     12 /04/2012 10:00 AM  [facility]  [facility]  [phone]  None        Future Orders  Please Complete By  Expires     Diet - low sodium heart healthy          Increase activity slowly          (HEART FAILURE PATIENTS) Call MD:  Anytime you have any of the following symptoms: 1) 3 pound weight gain in 24 hours or 5 pounds in 1 week 2) shortness of breath, with or without a dry hacking cough 3) swelling in the hands, feet or stomach 4) if you have to sleep on extra pillows at night in order to breathe.          Heart Failure patients record your daily weight using the same scale at the same time of day          ACE Inhibitor / ARB already ordered            Follow-up Information  Follow up with Arvilla Meres, MD. On 09/30/2012. (10:30 Garage Code 8000)      Contact information:     [address]  [address] [place] [address] [phone]           Follow up with [facility]. On 09/28/2012. (at 12:00)      Contact information:     [address], [address]  [place]  [place] [address] [phone]          Follow up with Tereso Newcomer, PA. On 10/11/2012. (9:50)      Contact information:     [address]  [address] [place] [address] [phone]                     Duration of Discharge Encounter: Greater than 35 minutes    Signed, Arvilla Meres , MD 09/23/2012

## 2012-11-18 ENCOUNTER — Ambulatory Visit (INDEPENDENT_AMBULATORY_CARE_PROVIDER_SITE_OTHER): Payer: Medicare Other | Admitting: *Deleted

## 2012-11-18 ENCOUNTER — Encounter: Payer: Self-pay | Admitting: Internal Medicine

## 2012-11-18 DIAGNOSIS — Z95810 Presence of automatic (implantable) cardiac defibrillator: Secondary | ICD-10-CM

## 2012-11-18 DIAGNOSIS — I5023 Acute on chronic systolic (congestive) heart failure: Secondary | ICD-10-CM | POA: Diagnosis not present

## 2012-11-18 DIAGNOSIS — I472 Ventricular tachycardia: Secondary | ICD-10-CM | POA: Diagnosis not present

## 2012-11-18 DIAGNOSIS — I4891 Unspecified atrial fibrillation: Secondary | ICD-10-CM

## 2012-11-18 LAB — ICD DEVICE OBSERVATION
AL IMPEDENCE ICD: 456 Ohm
BAMS-0001: 170 {beats}/min
BATTERY VOLTAGE: 3.1014 V
CHARGE TIME: 9.589 s
FVT: 0
PACEART VT: 0
RV LEAD THRESHOLD: 0.75 V
TOT-0002: 0
TZAT-0001ATACH: 3
TZAT-0002ATACH: NEGATIVE
TZAT-0002FASTVT: NEGATIVE
TZAT-0004SLOWVT: 8
TZAT-0004SLOWVT: 8
TZAT-0005SLOWVT: 84 pct
TZAT-0005SLOWVT: 91 pct
TZAT-0011SLOWVT: 10 ms
TZAT-0011SLOWVT: 10 ms
TZAT-0012ATACH: 150 ms
TZAT-0012ATACH: 150 ms
TZAT-0012SLOWVT: 200 ms
TZAT-0013SLOWVT: 2
TZAT-0013SLOWVT: 2
TZAT-0018SLOWVT: NEGATIVE
TZAT-0018SLOWVT: NEGATIVE
TZAT-0019ATACH: 6 V
TZAT-0019FASTVT: 8 V
TZAT-0020ATACH: 1.5 ms
TZAT-0020ATACH: 1.5 ms
TZAT-0020FASTVT: 1.5 ms
TZON-0003SLOWVT: 370 ms
TZON-0003VSLOWVT: 450 ms
TZON-0004SLOWVT: 32
TZON-0005SLOWVT: 12
TZST-0001ATACH: 6
TZST-0001FASTVT: 2
TZST-0001FASTVT: 3
TZST-0001FASTVT: 6
TZST-0001SLOWVT: 4
TZST-0002ATACH: NEGATIVE
TZST-0002FASTVT: NEGATIVE
TZST-0002FASTVT: NEGATIVE
TZST-0002FASTVT: NEGATIVE
TZST-0003SLOWVT: 35 J
TZST-0003SLOWVT: 35 J
TZST-0003SLOWVT: 35 J
VENTRICULAR PACING ICD: 87.25 pct

## 2012-11-18 NOTE — Progress Notes (Signed)
defib check in clinic  

## 2012-11-23 ENCOUNTER — Ambulatory Visit: Payer: Self-pay | Admitting: Cardiology

## 2012-11-23 DIAGNOSIS — I509 Heart failure, unspecified: Secondary | ICD-10-CM | POA: Diagnosis not present

## 2012-11-23 DIAGNOSIS — I4891 Unspecified atrial fibrillation: Secondary | ICD-10-CM | POA: Diagnosis not present

## 2012-11-23 DIAGNOSIS — I129 Hypertensive chronic kidney disease with stage 1 through stage 4 chronic kidney disease, or unspecified chronic kidney disease: Secondary | ICD-10-CM | POA: Diagnosis not present

## 2012-11-23 DIAGNOSIS — Z7901 Long term (current) use of anticoagulants: Secondary | ICD-10-CM

## 2012-11-23 DIAGNOSIS — Z87440 Personal history of urinary (tract) infections: Secondary | ICD-10-CM | POA: Diagnosis not present

## 2012-11-23 DIAGNOSIS — N189 Chronic kidney disease, unspecified: Secondary | ICD-10-CM | POA: Diagnosis not present

## 2012-11-23 DIAGNOSIS — I4892 Unspecified atrial flutter: Secondary | ICD-10-CM

## 2012-11-23 LAB — POCT INR: INR: 3.7

## 2012-11-25 ENCOUNTER — Encounter (HOSPITAL_COMMUNITY): Payer: Self-pay

## 2012-11-25 ENCOUNTER — Ambulatory Visit (HOSPITAL_COMMUNITY)
Admission: RE | Admit: 2012-11-25 | Discharge: 2012-11-25 | Disposition: A | Discharge status: Home / Self Care | Payer: Medicare Other | Source: Ambulatory Visit | Attending: Internal Medicine | Admitting: Internal Medicine

## 2012-11-25 VITALS — BP 112/76 | HR 83 | Wt 162.0 lb

## 2012-11-25 DIAGNOSIS — I4891 Unspecified atrial fibrillation: Secondary | ICD-10-CM | POA: Diagnosis not present

## 2012-11-25 DIAGNOSIS — N189 Chronic kidney disease, unspecified: Secondary | ICD-10-CM | POA: Diagnosis not present

## 2012-11-25 DIAGNOSIS — I5022 Chronic systolic (congestive) heart failure: Secondary | ICD-10-CM | POA: Insufficient documentation

## 2012-11-25 DIAGNOSIS — I509 Heart failure, unspecified: Secondary | ICD-10-CM | POA: Diagnosis not present

## 2012-11-25 DIAGNOSIS — Z87440 Personal history of urinary (tract) infections: Secondary | ICD-10-CM | POA: Diagnosis not present

## 2012-11-25 DIAGNOSIS — I129 Hypertensive chronic kidney disease with stage 1 through stage 4 chronic kidney disease, or unspecified chronic kidney disease: Secondary | ICD-10-CM | POA: Diagnosis not present

## 2012-11-25 MED ORDER — LISINOPRIL 20 MG PO TABS: 20.0000 mg | ORAL_TABLET | Freq: Every day | ORAL | Status: DC

## 2012-11-25 NOTE — Patient Instructions (Addendum)
Follow up in 2 weeks  Take  Lasix 40 mg twice a day for the next 2 days. Then back to lasix 40 mg daily.   Cut lisinopril to 20 mg daily  Do the following things EVERYDAY: 1) Weigh yourself in the morning before breakfast. Write it down and keep it in a log. 2) Take your medicines as prescribed 3) Eat low salt foods-Limit salt (sodium) to 2000 mg per day.  4) Stay as active as you can everyday 5) Limit all fluids for the day to less than 2 liters

## 2012-11-25 NOTE — Assessment & Plan Note (Addendum)
Overall much improved. NYHA II-early III by history. Weight is up about 4-6 pounds from usual range but does not have obvious fluid overload and says his BP drops when he takes extra lasix. Thus I am not sure if he does have mild volume overload or if has gained muscle/fat mass. We will double lasix x 2 days and have him track BPs and weights very closely. Also cut lisinopril back to 20 daily. IF BP drops with extra lasix may need to increase dry weight to 152-156. Follow up in 2 weeks then he will make a visit back to Dr Caprice Red. Will need labs today and next week.

## 2012-11-25 NOTE — Progress Notes (Signed)
Patient ID: [name], male   DOB: 05/05/1946, 68 y.o.   MRN: [id number]  Primary cardiologist: Dr. Antoine Poche  HPI: 67 y.o. male w/ PMHx significant for CAD (s/p DES to LCx '03, residual total occlusion of RCA w/ collaterals, normal myoview 2012), ICM (EF 25%, s/p Medtronic BiV ICD), Atrial Fib/Flutter (s/p DCCV, on coumadin), HTN, HLD, CKD, hyponatremia, and Seizure disorder  Admitted to [facility] 09/16/12 for ongoing ab pain thought to be related to HF. GI w/u to date has been negative. CT scan showed diffuse mesenteric calcification but pain not felt to be intestinal claudication. D/C from [facility] 09/23/12. Presented with NYHA III-IV symptoms. Required Milrionone due to low output heart failure. Diuresed 14 pounds. Discharge weight 155 pounds. Advanced Heart Therapies discussed during admission which included LVAD, home inotropes, or hospice. He was somewhat reluctant to consider. RHC 09/17/2012  RA = 22  RV = 67/17/22  PA = 65/41 (50)  PCW = 38 (v-waves to 45)  Fick cardiac output/index = 3.8/2.0  Thermo CO/CI = 2.3/1.2  PVR = 5.0 woods  FA sat = 92%  PA sat = 51%, 52%   CPX 12/4 /13 Peak VO2: 17.1 % predicted peak VO2: 59.7% VE/VCO2 slope: 25.7 OUES: 2.13 Peak RER: 1.02 Ventilatory Threshold: 15 % predicted peak VO2: 52.4% Test was stopped early due excessive ventricular ectopy, afib with RVR and significant fall in BP.  He returns for follow up. Last visit carvedilol XL increased to 20 mg daily. Denies SOB/PND/Orthopnea. Complains of dizziness occasionally when he exercises and afterwards this is worse on days he takes extra lasix.  BP has dropped to the 80s. Weight at home 155-159 pounds. (previous baseline 150-154) He has required extra Lasix 3-4 times over the last month. Denies orthopnea, PND, LE edema or significant ab symptoms. He is focused on low BP and wants to cut lisinopril back.   ROS: All systems negative except as listed in HPI, PMH and Problem List.  Past Medical History    Diagnosis Date  . Ischemic cardiomyopathy     Myoview 02/2011  EF 28%  Coronary artery disease  (total occlusion of the right coronary artery, left-to-right )  . Atrial flutter     s/p TEE guided cardioversion  . Nonsustained ventricular tachycardia   . LBBB (left bundle branch block)   . Chronic drug-induced interstitial lung disorders   . Acute on chronic renal insufficiency   . Coronary artery disease     total occlusion of the right coronary artery, left to right collaterals.  He had Cypher stenting to the circumflex in  July 2003  . Seizure disorder   . AICD (automatic cardioverter/defibrillator) present     Medtronic CRT  . Hyponatremia   . 6949-lead     replaced 02/2011  . Hyperlipemia   . Hypertension   . Fatty liver     CT  . Family hx of colon cancer     2 SISTERS  . CHF (congestive heart failure)   . Asthma   . COPD (chronic obstructive pulmonary disease)     " very MILD "  . Headache     Current Outpatient Prescriptions  Medication Sig Dispense Refill  . Calcium-Magnesium 500-250 MG TABS Take 1 tablet by mouth daily.       . carvedilol (COREG CR) 20 MG 24 hr capsule Take 1 capsule (20 mg total) by mouth daily.  30 capsule  3  . co-enzyme Q-10 30 MG capsule Take 30 mg by mouth  3 (three) times daily.        . colchicine 0.6 MG tablet Take 0.6 mg by mouth daily as needed. For gout flareups.      . digoxin (LANOXIN) 0.125 MG tablet Take 1 tablet (0.125 mg total) by mouth every other day.  15 tablet  10  . diphenhydrAMINE (BENADRYL) 25 mg capsule Take 25 mg by mouth every 6 (six) hours as needed. For allergies.      . famotidine (PEPCID) 40 MG tablet Take 1 tablet (40 mg total) by mouth 2 (two) times daily.  60 tablet  1  . fish oil-omega-3 fatty acids 1000 MG capsule Take 2 g by mouth daily.      . furosemide (LASIX) 40 MG tablet Take 1 tablet (40 mg total) by mouth daily.  60 tablet  6  . hydrALAZINE (APRESOLINE) 50 MG tablet Take 1 tablet (50 mg total) by mouth  every 8 (eight) hours.  90 tablet  3  . lisinopril (PRINIVIL,ZESTRIL) 20 MG tablet Take 1 tablet (20 mg total) by mouth daily.  30 tablet  6  . Multiple Vitamin (MULTIVITAMIN WITH MINERALS) TABS Take 1 tablet by mouth daily.      . nitroGLYCERIN (NITROSTAT) 0.4 MG SL tablet Place 0.4 mg under the tongue every 5 (five) minutes as needed. For chest pain      . ondansetron (ZOFRAN-ODT) 4 MG disintegrating tablet Take 4 mg by mouth every 4 (four) hours as needed. For nausea      . oxyCODONE-acetaminophen (PERCOCET/ROXICET) 5-325 MG per tablet Take 1 tablet by mouth every 6 (six) hours as needed. For pain      . potassium chloride SA (K-DUR,KLOR-CON) 20 MEQ tablet Take 20 mEq by mouth daily.      Marland Kitchen spironolactone (ALDACTONE) 12.5 mg TABS Take 0.5 tablets (12.5 mg total) by mouth daily.  30 tablet  3  . vitamin C (ASCORBIC ACID) 500 MG tablet Take 500 mg by mouth daily.      Marland Kitchen warfarin (COUMADIN) 5 MG tablet Take 5-5.5 mg by mouth daily. Take 5mg  Sunday, Tuesday, Thursday. And take 7.5mg  Monday, Wednesday, Friday,      . traMADol (ULTRAM) 50 MG tablet Take 50 mg by mouth every 6 (six) hours as needed. For pain.         PHYSICAL EXAM: Filed Vitals:   11/25/12 1210  BP: 112/76  Pulse: 83   General:  Thin. Well appearing. No resp difficulty wife present HEENT: normal Neck: supple. JVP 6-7 Carotids 2+ bilaterally; no bruits. No lymphadenopathy or thryomegaly appreciated. Cor: PMI normal. Regular rate & rhythm. No rubs, gallops or murmurs. Lungs: clear Abdomen: soft, nontender, nondistended. No hepatosplenomegaly. No bruits or masses. Good bowel sounds. Extremities: no cyanosis, clubbing, rash, edema Neuro: alert & orientedx3, cranial nerves grossly intact. Moves all 4 extremities w/o difficulty. Affect pleasant.     ASSESSMENT & PLAN:

## 2012-12-03 ENCOUNTER — Telehealth: Payer: Self-pay | Admitting: Cardiology

## 2012-12-03 ENCOUNTER — Ambulatory Visit: Payer: Self-pay | Admitting: Cardiovascular Disease

## 2012-12-03 DIAGNOSIS — I509 Heart failure, unspecified: Secondary | ICD-10-CM | POA: Diagnosis not present

## 2012-12-03 DIAGNOSIS — Z7901 Long term (current) use of anticoagulants: Secondary | ICD-10-CM

## 2012-12-03 DIAGNOSIS — I4892 Unspecified atrial flutter: Secondary | ICD-10-CM

## 2012-12-03 DIAGNOSIS — I4891 Unspecified atrial fibrillation: Secondary | ICD-10-CM | POA: Diagnosis not present

## 2012-12-03 DIAGNOSIS — I129 Hypertensive chronic kidney disease with stage 1 through stage 4 chronic kidney disease, or unspecified chronic kidney disease: Secondary | ICD-10-CM | POA: Diagnosis not present

## 2012-12-03 DIAGNOSIS — N189 Chronic kidney disease, unspecified: Secondary | ICD-10-CM | POA: Diagnosis not present

## 2012-12-03 DIAGNOSIS — Z87440 Personal history of urinary (tract) infections: Secondary | ICD-10-CM | POA: Diagnosis not present

## 2012-12-03 LAB — POCT INR: INR: 2.2

## 2012-12-03 MED ORDER — WARFARIN SODIUM 5 MG PO TABS: 5.0000 mg | ORAL_TABLET | ORAL | Status: DC

## 2012-12-03 NOTE — Telephone Encounter (Signed)
New Problem:    Patient has switched his pharmacy to the [facility] in [place] phone#- [phone].  This is the patient new permanent pharmacy.  Patient needs a refill of his warfarin (COUMADIN) 5 MG tablet.

## 2012-12-06 NOTE — Addendum Note (Signed)
Addended by: Reine Just on: 12/06/2012 02:19 PM   Modules accepted: Orders

## 2012-12-09 ENCOUNTER — Telehealth (HOSPITAL_COMMUNITY): Payer: Self-pay | Admitting: Adult Health

## 2012-12-09 ENCOUNTER — Ambulatory Visit (HOSPITAL_COMMUNITY)
Admission: RE | Admit: 2012-12-09 | Discharge: 2012-12-09 | Disposition: A | Discharge status: Home / Self Care | Payer: Medicare Other | Source: Ambulatory Visit | Attending: Internal Medicine | Admitting: Internal Medicine

## 2012-12-09 ENCOUNTER — Encounter (HOSPITAL_COMMUNITY): Payer: Self-pay

## 2012-12-09 VITALS — BP 110/72 | HR 79 | Wt 165.1 lb

## 2012-12-09 DIAGNOSIS — I5022 Chronic systolic (congestive) heart failure: Secondary | ICD-10-CM

## 2012-12-09 LAB — BASIC METABOLIC PANEL
Calcium: 9.4 mg/dL (ref 8.4–10.5)
Creatinine, Ser: 1.29 mg/dL (ref 0.50–1.35)
GFR calc Af Amer: 64 mL/min — ABNORMAL LOW (ref >90)

## 2012-12-09 NOTE — Telephone Encounter (Signed)
Discussed Lab results with Dr Gala Romney. Repeat BMET Monday at [facility].  We will fax an order.   Stormie Ventola NP-C 4:32 PM

## 2012-12-09 NOTE — Telephone Encounter (Signed)
Please get BMET Monday between 10-11:00 am at [facility].   Tasnim Balentine NP-C4:50 PM

## 2012-12-09 NOTE — Patient Instructions (Addendum)
Follow up in 3-4 months  Take an extra Lasix if your weight is 162 pounds (dry weight weight 159-162)  Do the following things EVERYDAY: 1) Weigh yourself in the morning before breakfast. Write it down and keep it in a log. 2) Take your medicines as prescribed 3) Eat low salt foods-Limit salt (sodium) to 2000 mg per day.  4) Stay as active as you can everyday 5) Limit all fluids for the day to less than 2 liters

## 2012-12-09 NOTE — Assessment & Plan Note (Addendum)
He is much improved. NYHA II. Weight and BP charts reviewed with him and his wife closely.Given ongoing dizziness during exercise he seems dry. Adjust dry weight to 159 pounds. Instructed to take an additional lasix if weight > 162 pounds. Will not titrate any other meds at this time. Follow up with Dr Antoine Poche next month. Check BMET today. Follow up in 3-4 months. Will consider repeat CPX in 6-12 months.

## 2012-12-10 NOTE — Telephone Encounter (Signed)
Pt was aware and order faxed to [facility]i at [phone]

## 2012-12-10 NOTE — Progress Notes (Signed)
Patient ID: [name], male   DOB: 19-Nov-1945, 67 y.o.   MRN: [id number]  Primary cardiologist: Dr. Antoine Poche PCP: Dr Eligah East  HPI: 67 y.o. male w/ PMHx significant for CAD (s/p DES to LCx '03, residual total occlusion of RCA w/ collaterals, normal myoview 2012), ICM (EF 25%, s/p Medtronic BiV ICD), Atrial Fib/Flutter (s/p DCCV, on coumadin), HTN, HLD, CKD, hyponatremia, and Seizure disorder  Admitted to [facility] 09/16/12 for ongoing ab pain thought to be related to HF. CT scan showed diffuse mesenteric calcification but pain not felt to be intestinal claudication. D/C from [facility] 09/23/12. Presented with NYHA III-IV symptoms. Required Milrionone due to low output heart failure. Diuresed 14 pounds. Discharge weight 155 pounds.   RHC 09/17/2012  RA = 22  RV = 67/17/22  PA = 65/41 (50)  PCW = 38 (v-waves to 45)  Fick cardiac output/index = 3.8/2.0  Thermo CO/CI = 2.3/1.2  PVR = 5.0 woods  FA sat = 92%  PA sat = 51%, 52%   CPX 12/4 /13 Peak VO2: 17.1 % predicted peak VO2: 59.7% VE/VCO2 slope: 25.7 OUES: 2.13 Peak RER: 1.02 Ventilatory Threshold: 15 % predicted peak VO2: 52.4% Test was stopped early due excessive ventricular ectopy, afib with RVR and significant fall in BP.  He returns for follow up. Last visit lisinopril cut back to 20 mg daily.  Denies SOB/PND/Orthopnea. Complains of dizziness every time after he exercises.  Complains of constipation. SBP 100-120. He has had one drop in blood pressure to 86/49. Weight at home 156-159 pounds. He has had 2 extra lasix. Follows low salt diet and limits fluid intake.    ROS: All systems negative except as listed in HPI, PMH and Problem List.  Past Medical History  Diagnosis Date  . Ischemic cardiomyopathy     Myoview 02/2011  EF 28%  Coronary artery disease  (total occlusion of the right coronary artery, left-to-right )  . Atrial flutter     s/p TEE guided cardioversion  . Nonsustained ventricular tachycardia   . LBBB (left bundle branch  block)   . Chronic drug-induced interstitial lung disorders   . Acute on chronic renal insufficiency   . Coronary artery disease     total occlusion of the right coronary artery, left to right collaterals.  He had Cypher stenting to the circumflex in  July 2003  . Seizure disorder   . AICD (automatic cardioverter/defibrillator) present     Medtronic CRT  . Hyponatremia   . 6949-lead     replaced 02/2011  . Hyperlipemia   . Hypertension   . Fatty liver     CT  . Family hx of colon cancer     2 SISTERS  . CHF (congestive heart failure)   . Asthma   . COPD (chronic obstructive pulmonary disease)     " very MILD "  . Headache     Current Outpatient Prescriptions  Medication Sig Dispense Refill  . Calcium-Magnesium 500-250 MG TABS Take 1 tablet by mouth daily.       . carvedilol (COREG CR) 20 MG 24 hr capsule Take 1 capsule (20 mg total) by mouth daily.  30 capsule  3  . co-enzyme Q-10 30 MG capsule Take 30 mg by mouth 3 (three) times daily.        . colchicine 0.6 MG tablet Take 0.6 mg by mouth daily as needed. For gout flareups.      . digoxin (LANOXIN) 0.125 MG tablet Take 1 tablet (0.125  mg total) by mouth every other day.  15 tablet  10  . diphenhydrAMINE (BENADRYL) 25 mg capsule Take 25 mg by mouth every 6 (six) hours as needed. For allergies.      . famotidine (PEPCID) 40 MG tablet Take 1 tablet (40 mg total) by mouth 2 (two) times daily.  60 tablet  1  . fish oil-omega-3 fatty acids 1000 MG capsule Take 2 g by mouth daily.      . furosemide (LASIX) 40 MG tablet Take 1 tablet (40 mg total) by mouth daily.  60 tablet  6  . hydrALAZINE (APRESOLINE) 50 MG tablet Take 1 tablet (50 mg total) by mouth every 8 (eight) hours.  90 tablet  3  . lisinopril (PRINIVIL,ZESTRIL) 20 MG tablet Take 1 tablet (20 mg total) by mouth daily.  30 tablet  6  . Multiple Vitamin (MULTIVITAMIN WITH MINERALS) TABS Take 1 tablet by mouth daily.      . nitroGLYCERIN (NITROSTAT) 0.4 MG SL tablet Place 0.4 mg  under the tongue every 5 (five) minutes as needed. For chest pain      . ondansetron (ZOFRAN-ODT) 4 MG disintegrating tablet Take 4 mg by mouth every 4 (four) hours as needed. For nausea      . oxyCODONE-acetaminophen (PERCOCET/ROXICET) 5-325 MG per tablet Take 1 tablet by mouth every 6 (six) hours as needed. For pain      . potassium chloride SA (K-DUR,KLOR-CON) 20 MEQ tablet Take 20 mEq by mouth daily.      Marland Kitchen spironolactone (ALDACTONE) 12.5 mg TABS Take 0.5 tablets (12.5 mg total) by mouth daily.  30 tablet  3  . traMADol (ULTRAM) 50 MG tablet Take 50 mg by mouth every 6 (six) hours as needed. For pain.      . vitamin C (ASCORBIC ACID) 500 MG tablet Take 500 mg by mouth daily.      Marland Kitchen warfarin (COUMADIN) 5 MG tablet Take 1 tablet (5 mg total) by mouth as directed.  45 tablet  3     PHYSICAL EXAM: Filed Vitals:   12/09/12 1345  BP: 110/72  Pulse: 79   General:  Thin. Well appearing. No resp difficulty wife present HEENT: normal Neck: supple. JVP 6-7 Carotids 2+ bilaterally; no bruits. No lymphadenopathy or thryomegaly appreciated. Cor: PMI normal. Regular rate & rhythm. No rubs, gallops or murmurs. Lungs: clear Abdomen: soft, nontender, nondistended. No hepatosplenomegaly. No bruits or masses. Good bowel sounds. Extremities: no cyanosis, clubbing, rash, edema Neuro: alert & orientedx3, cranial nerves grossly intact. Moves all 4 extremities w/o difficulty. Affect pleasant.     ASSESSMENT & PLAN:

## 2012-12-13 DIAGNOSIS — I5022 Chronic systolic (congestive) heart failure: Secondary | ICD-10-CM | POA: Diagnosis not present

## 2012-12-14 ENCOUNTER — Telehealth (HOSPITAL_COMMUNITY): Payer: Self-pay | Admitting: Cardiology

## 2012-12-14 NOTE — Telephone Encounter (Signed)
PT WAS CALLING TO GER LAB RESULTS. PLEASE CALL

## 2012-12-15 ENCOUNTER — Other Ambulatory Visit (HOSPITAL_COMMUNITY): Payer: Self-pay | Admitting: Adult Health

## 2012-12-16 ENCOUNTER — Ambulatory Visit (INDEPENDENT_AMBULATORY_CARE_PROVIDER_SITE_OTHER): Payer: Medicare Other | Admitting: General Practice

## 2012-12-16 DIAGNOSIS — I129 Hypertensive chronic kidney disease with stage 1 through stage 4 chronic kidney disease, or unspecified chronic kidney disease: Secondary | ICD-10-CM | POA: Diagnosis not present

## 2012-12-16 DIAGNOSIS — Z7901 Long term (current) use of anticoagulants: Secondary | ICD-10-CM

## 2012-12-16 DIAGNOSIS — I4891 Unspecified atrial fibrillation: Secondary | ICD-10-CM | POA: Diagnosis not present

## 2012-12-16 DIAGNOSIS — I509 Heart failure, unspecified: Secondary | ICD-10-CM | POA: Diagnosis not present

## 2012-12-16 DIAGNOSIS — Z87440 Personal history of urinary (tract) infections: Secondary | ICD-10-CM | POA: Diagnosis not present

## 2012-12-16 DIAGNOSIS — N189 Chronic kidney disease, unspecified: Secondary | ICD-10-CM | POA: Diagnosis not present

## 2012-12-16 DIAGNOSIS — I4892 Unspecified atrial flutter: Secondary | ICD-10-CM

## 2012-12-16 LAB — POCT INR: INR: 1.8

## 2012-12-24 NOTE — Telephone Encounter (Signed)
Pt called again regarding lab results. Told pt that we have not received lab results from primary office however I would personally call for results.   [phone] Labs were done. BUN 24 Creatinine 1.33 Sodium 128 Asked office staff if labs could be re faxed.  Per VO [employer] labs were ok. More than welcome to have labs rechecked x 2 weeks   Pt aware

## 2013-01-06 DIAGNOSIS — M999 Biomechanical lesion, unspecified: Secondary | ICD-10-CM | POA: Diagnosis not present

## 2013-01-11 ENCOUNTER — Encounter: Payer: Self-pay | Admitting: Cardiology

## 2013-01-11 ENCOUNTER — Ambulatory Visit (INDEPENDENT_AMBULATORY_CARE_PROVIDER_SITE_OTHER): Payer: Medicare Other | Admitting: Cardiology

## 2013-01-11 ENCOUNTER — Ambulatory Visit (INDEPENDENT_AMBULATORY_CARE_PROVIDER_SITE_OTHER): Payer: Medicare Other | Admitting: *Deleted

## 2013-01-11 VITALS — BP 114/66 | HR 67 | Ht 68.0 in | Wt 167.0 lb

## 2013-01-11 DIAGNOSIS — I472 Ventricular tachycardia, unspecified: Secondary | ICD-10-CM | POA: Diagnosis not present

## 2013-01-11 DIAGNOSIS — I4892 Unspecified atrial flutter: Secondary | ICD-10-CM

## 2013-01-11 DIAGNOSIS — I2589 Other forms of chronic ischemic heart disease: Secondary | ICD-10-CM | POA: Diagnosis not present

## 2013-01-11 DIAGNOSIS — Z7901 Long term (current) use of anticoagulants: Secondary | ICD-10-CM | POA: Diagnosis not present

## 2013-01-11 DIAGNOSIS — I255 Ischemic cardiomyopathy: Secondary | ICD-10-CM

## 2013-01-11 DIAGNOSIS — I4891 Unspecified atrial fibrillation: Secondary | ICD-10-CM | POA: Diagnosis not present

## 2013-01-11 LAB — BASIC METABOLIC PANEL
BUN: 19 mg/dL (ref 6–23)
Chloride: 97 mEq/L (ref 96–112)
Creatinine, Ser: 1.3 mg/dL (ref 0.4–1.5)
GFR: 59.36 mL/min — ABNORMAL LOW (ref >60.00)
Glucose, Bld: 89 mg/dL (ref 70–99)

## 2013-01-11 NOTE — Patient Instructions (Addendum)
Your physician recommends that you schedule a follow-up appointment in: 2 MONTHS WITH DR [facility]  Your physician recommends that you HAVE LAB WORK TODAY

## 2013-01-11 NOTE — Progress Notes (Signed)
HPI   The patient presents for followup of his cardiomyopathy.  He is now being followed by the [facility].  His CPX was only mildly abnormal.  He is not being considered for a advanced therapies. He actually feels pretty good. He has had some low blood pressures with exercise but he has had no presyncope or syncope.  The patient denies any new symptoms such as chest discomfort, neck or arm discomfort. There has been no new shortness of breath, PND or orthopnea. There have been no reported palpitations, presyncope or syncope.  Allergies  Allergen Reactions  . Ace Inhibitors   . Amlodipine Besylate     Numbness to lips; made legs give out  . Codeine Itching  . Corzide (Nadolol-Bendroflumethiazide)   . Diovan (Valsartan)   . Flagyl (Metronidazole)   . Norvasc (Amlodipine Besylate)     Numbness to lips; made legs give out  . Omeprazole     Shut my kidneys down  . Other     Perfumes smells; paint smells; formaldehyde smells  . Penicillins   . Shellfish Allergy   . Latex Itching and Rash    Current Outpatient Prescriptions  Medication Sig Dispense Refill  . Calcium-Magnesium 500-250 MG TABS Take 1 tablet by mouth daily.       . carvedilol (COREG CR) 20 MG 24 hr capsule Take 1 capsule (20 mg total) by mouth daily.  30 capsule  3  . co-enzyme Q-10 30 MG capsule Take 30 mg by mouth 3 (three) times daily.        . colchicine 0.6 MG tablet Take 0.6 mg by mouth daily as needed. For gout flareups.      . digoxin (LANOXIN) 0.125 MG tablet Take 1 tablet (0.125 mg total) by mouth every other day.  15 tablet  10  . diphenhydrAMINE (BENADRYL) 25 mg capsule Take 25 mg by mouth every 6 (six) hours as needed. For allergies.      . famotidine (PEPCID) 40 MG tablet Take 1 tablet (40 mg total) by mouth 2 (two) times daily.  60 tablet  1  . fish oil-omega-3 fatty acids 1000 MG capsule Take 2 g by mouth daily.      . furosemide (LASIX) 40 MG tablet Take 1 tablet (40 mg total) by mouth daily.   60 tablet  6  . hydrALAZINE (APRESOLINE) 50 MG tablet TAKE 1 TABLET BY MOUTH EVERY 8 HOURS  90 tablet  6  . lisinopril (PRINIVIL,ZESTRIL) 20 MG tablet Take 1 tablet (20 mg total) by mouth daily.  30 tablet  6  . Multiple Vitamin (MULTIVITAMIN WITH MINERALS) TABS Take 1 tablet by mouth daily.      . nitroGLYCERIN (NITROSTAT) 0.4 MG SL tablet Place 0.4 mg under the tongue every 5 (five) minutes as needed. For chest pain      . ondansetron (ZOFRAN-ODT) 4 MG disintegrating tablet Take 4 mg by mouth every 4 (four) hours as needed. For nausea      . oxyCODONE-acetaminophen (PERCOCET/ROXICET) 5-325 MG per tablet Take 1 tablet by mouth every 6 (six) hours as needed. For pain      . potassium chloride SA (K-DUR,KLOR-CON) 20 MEQ tablet TAKE 1 TABLET BY MOUTH EVERY DAY  30 tablet  6  . spironolactone (ALDACTONE) 12.5 mg TABS Take 0.5 tablets (12.5 mg total) by mouth daily.  30 tablet  3  . traMADol (ULTRAM) 50 MG tablet Take 50 mg by mouth every 6 (six) hours as needed. For  pain.      . vitamin C (ASCORBIC ACID) 500 MG tablet Take 500 mg by mouth daily.      Marland Kitchen warfarin (COUMADIN) 5 MG tablet Take 1 tablet (5 mg total) by mouth as directed.  45 tablet  3   No current facility-administered medications for this visit.    Past Medical History  Diagnosis Date  . Ischemic cardiomyopathy     Myoview 02/2011  EF 28%  Coronary artery disease  (total occlusion of the right coronary artery, left-to-right )  . Atrial flutter     s/p TEE guided cardioversion  . Nonsustained ventricular tachycardia   . LBBB (left bundle branch block)   . Chronic drug-induced interstitial lung disorders   . Acute on chronic renal insufficiency   . Coronary artery disease     total occlusion of the right coronary artery, left to right collaterals.  He had Cypher stenting to the circumflex in  July 2003  . Seizure disorder   . AICD (automatic cardioverter/defibrillator) present     Medtronic CRT  . Hyponatremia   . 6949-lead      replaced 02/2011  . Hyperlipemia   . Hypertension   . Fatty liver     CT  . Family hx of colon cancer     2 SISTERS  . CHF (congestive heart failure)   . Asthma   . COPD (chronic obstructive pulmonary disease)     " very MILD "  . Headache     Past Surgical History  Procedure Laterality Date  . Insert / replace / remove pacemaker      AICD medtronic  . Hernia repair    . Esophagogastroduodenoscopy  09/13/2012    Procedure: ESOPHAGOGASTRODUODENOSCOPY (EGD);  Surgeon: Meryl Dare, MD,FACG;  Location: [facility];  Service: Endoscopy;  Laterality: N/A;  . Coronary angioplasty      ROS:  Right knee pain.  Otherwise as stated in the HPI and negative for all other systems.  PHYSICAL EXAM BP 114/66  Pulse 67  Ht 5\' 8"  (1.727 m)  Wt 167 lb (75.751 kg)  BMI 25.4 kg/m2  SpO2 97% GENERAL:  Well appearing HEENT:  Pupils equal round and reactive, fundi not visualized, oral mucosa unremarkable NECK:  No jugular venous distention, waveform within normal limits, carotid upstroke brisk and symmetric, no bruits, no thyromegaly LYMPHATICS:  No cervical, inguinal adenopathy LUNGS:  Clear to auscultation bilaterally BACK:  No CVA tenderness CHEST:  ICD pocket  HEART:  PMI not displaced or sustained,S1 and S2 within normal limits, no S3, no S4, no clicks, no rubs, no murmurs ABD:  Flat, positive bowel sounds normal in frequency in pitch, no bruits, no rebound, no guarding, no midline pulsatile mass, no hepatomegaly, no splenomegaly EXT:  2 plus pulses throughout, no edema, no cyanosis no clubbing SKIN:  No rashes no nodules NEURO:  Cranial nerves II through XII grossly intact, motor grossly intact throughout PSYCH:  Cognitively intact, oriented to person place and time  EKG:  Atrial fibrillation with ventricular pacing, rate 67. 11/28/2011  ASSESSMENT AND PLAN  Chronic Systolic CHF:  He seems to be euvolemic today. I will not change his medications. Because his sodium was recently  only 123 he will get a basic metabolic profile today.  Coronary Artery Disease: The patient has no new sypmtoms.  No further cardiovascular testing is indicated.  We will continue with aggressive risk reduction and meds as listed.  Hypertension: This is being managed in the context  of treating his CHF  Atrial Flutter: He is in fibrillation.  However his had good rate control. He tolerates anticoagulation. No change in therapy is planned.  Chronic Kidney Disease: Repeat BMET pending today.   S/p BiV-ICD: Follow up with Dr. Sherryl Manges as planned.

## 2013-01-18 ENCOUNTER — Other Ambulatory Visit (HOSPITAL_COMMUNITY): Payer: Self-pay | Admitting: Adult Health

## 2013-01-26 ENCOUNTER — Ambulatory Visit (INDEPENDENT_AMBULATORY_CARE_PROVIDER_SITE_OTHER): Payer: Medicare Other | Admitting: *Deleted

## 2013-01-26 DIAGNOSIS — Z7901 Long term (current) use of anticoagulants: Secondary | ICD-10-CM | POA: Diagnosis not present

## 2013-01-26 LAB — POCT INR: INR: 2.5

## 2013-02-09 ENCOUNTER — Encounter: Payer: Medicare Other | Admitting: Internal Medicine

## 2013-02-16 DIAGNOSIS — H43819 Vitreous degeneration, unspecified eye: Secondary | ICD-10-CM | POA: Diagnosis not present

## 2013-02-16 DIAGNOSIS — H43399 Other vitreous opacities, unspecified eye: Secondary | ICD-10-CM | POA: Diagnosis not present

## 2013-02-16 DIAGNOSIS — H2510 Age-related nuclear cataract, unspecified eye: Secondary | ICD-10-CM | POA: Diagnosis not present

## 2013-02-22 ENCOUNTER — Other Ambulatory Visit (HOSPITAL_COMMUNITY): Payer: Self-pay | Admitting: Adult Health

## 2013-02-23 ENCOUNTER — Ambulatory Visit (INDEPENDENT_AMBULATORY_CARE_PROVIDER_SITE_OTHER): Payer: Medicare Other | Admitting: *Deleted

## 2013-02-23 DIAGNOSIS — I4891 Unspecified atrial fibrillation: Secondary | ICD-10-CM | POA: Diagnosis not present

## 2013-02-23 DIAGNOSIS — Z7901 Long term (current) use of anticoagulants: Secondary | ICD-10-CM | POA: Diagnosis not present

## 2013-02-23 DIAGNOSIS — I4892 Unspecified atrial flutter: Secondary | ICD-10-CM | POA: Diagnosis not present

## 2013-03-01 ENCOUNTER — Telehealth (HOSPITAL_COMMUNITY): Payer: Self-pay | Admitting: Cardiology

## 2013-03-01 ENCOUNTER — Encounter (HOSPITAL_COMMUNITY): Payer: Self-pay | Admitting: Cardiology

## 2013-03-01 NOTE — Telephone Encounter (Signed)
Attempting to contact pt regarding follow up. (Due 3-4 months from last visit on 12/09/12) I have been unable to reach this patient by phone.  A letter is being sent to the last known home address.

## 2013-03-02 ENCOUNTER — Encounter: Payer: Self-pay | Admitting: Internal Medicine

## 2013-03-02 ENCOUNTER — Encounter: Payer: Self-pay | Admitting: *Deleted

## 2013-03-02 ENCOUNTER — Ambulatory Visit (INDEPENDENT_AMBULATORY_CARE_PROVIDER_SITE_OTHER): Payer: Medicare Other | Admitting: Internal Medicine

## 2013-03-02 ENCOUNTER — Telehealth: Payer: Self-pay | Admitting: Internal Medicine

## 2013-03-02 VITALS — BP 131/80 | HR 80 | Ht 68.0 in | Wt 171.6 lb

## 2013-03-02 DIAGNOSIS — I472 Ventricular tachycardia, unspecified: Secondary | ICD-10-CM

## 2013-03-02 DIAGNOSIS — I255 Ischemic cardiomyopathy: Secondary | ICD-10-CM

## 2013-03-02 DIAGNOSIS — Z95810 Presence of automatic (implantable) cardiac defibrillator: Secondary | ICD-10-CM

## 2013-03-02 DIAGNOSIS — I5022 Chronic systolic (congestive) heart failure: Secondary | ICD-10-CM

## 2013-03-02 DIAGNOSIS — I4891 Unspecified atrial fibrillation: Secondary | ICD-10-CM | POA: Diagnosis not present

## 2013-03-02 DIAGNOSIS — I2589 Other forms of chronic ischemic heart disease: Secondary | ICD-10-CM

## 2013-03-02 DIAGNOSIS — I5023 Acute on chronic systolic (congestive) heart failure: Secondary | ICD-10-CM

## 2013-03-02 LAB — ICD DEVICE OBSERVATION
ATRIAL PACING ICD: 0.18 pct
BAMS-0001: 170 {beats}/min
LV LEAD IMPEDENCE ICD: 608 Ohm
LV LEAD THRESHOLD: 0.75 V
PACEART VT: 0
RV LEAD THRESHOLD: 0.75 V
TOT-0006: 20120419000000
TZAT-0001ATACH: 1
TZAT-0002ATACH: NEGATIVE
TZAT-0002ATACH: NEGATIVE
TZAT-0005SLOWVT: 84 pct
TZAT-0005SLOWVT: 91 pct
TZAT-0011SLOWVT: 10 ms
TZAT-0011SLOWVT: 10 ms
TZAT-0012ATACH: 150 ms
TZAT-0012SLOWVT: 200 ms
TZAT-0012SLOWVT: 200 ms
TZAT-0018ATACH: NEGATIVE
TZAT-0018SLOWVT: NEGATIVE
TZAT-0018SLOWVT: NEGATIVE
TZAT-0019ATACH: 6 V
TZAT-0019ATACH: 6 V
TZAT-0019FASTVT: 8 V
TZAT-0019SLOWVT: 8 V
TZAT-0019SLOWVT: 8 V
TZAT-0020ATACH: 1.5 ms
TZAT-0020ATACH: 1.5 ms
TZAT-0020FASTVT: 1.5 ms
TZON-0003ATACH: 350 ms
TZON-0003SLOWVT: 370 ms
TZON-0005SLOWVT: 12
TZST-0001ATACH: 4
TZST-0001ATACH: 5
TZST-0001FASTVT: 2
TZST-0001FASTVT: 3
TZST-0001SLOWVT: 4
TZST-0001SLOWVT: 6
TZST-0002FASTVT: NEGATIVE
TZST-0002FASTVT: NEGATIVE
TZST-0002FASTVT: NEGATIVE
TZST-0003SLOWVT: 25 J
TZST-0003SLOWVT: 35 J
TZST-0003SLOWVT: 35 J
VENTRICULAR PACING ICD: 83.31 pct
VF: 0

## 2013-03-02 MED ORDER — BISOPROLOL FUMARATE 5 MG PO TABS: 2.5000 mg | ORAL_TABLET | Freq: Every day | ORAL | Status: DC

## 2013-03-02 MED ORDER — WARFARIN SODIUM 5 MG PO TABS: 5.0000 mg | ORAL_TABLET | ORAL | Status: DC

## 2013-03-02 NOTE — Telephone Encounter (Signed)
Spoke with pt wife, dr Graciela Husbands aware of repeat bp. Pt instructed to start the bisoprolol, it is not for his bp, it is for his heart. Pt voiced understanding.

## 2013-03-02 NOTE — Patient Instructions (Addendum)
Your physician wants you to follow-up in: ONE YEAR WITH DR Logan Bores will receive a reminder letter in the mail two months in advance. If you don't receive a letter, please call our office to schedule the follow-up appointment.   Remote monitoring is used to monitor your Pacemaker of ICD from home. This monitoring reduces the number of office visits required to check your device to one time per year. It allows [place] to keep an eye on the functioning of your device to ensure it is working properly. You are scheduled for a device check from home on 06/06/13. You may send your transmission at any time that day. If you have a wireless device, the transmission will be sent automatically. After your physician reviews your transmission, you will receive a postcard with your next transmission date.   START BISOPROLOL 5 MG TAKE ONE HALF TABLET ONCE DAILY

## 2013-03-02 NOTE — Assessment & Plan Note (Signed)
The patient has persistent cardiomyopathy stable class 2-3 heart failure.  Notably she is a not sure of. The first is he is taking hydralazine without nitrates. The second is that his blood presently quite elevated. He apparently has had problems with hypotension with exercise. I note that he has loss of resynchronization with platelets. He is ventricularly paced only 85% of the time suggesting that exercise is associated with rapid ventricular rates over running his ventricular pacing. Options would include AV junction ablation to which she is adamantly opposed. Further alternative would be to up titrate his beta blocker. Initially I will add a little but bisoprolol 2.5 mg of carvedilol. This will potentially allow [place] to further control his heart rate and maintain resynchronization. Treadmill testing may help [place] further elucidate why it is that his blood pressure falls with exercise.

## 2013-03-02 NOTE — Telephone Encounter (Signed)
Pt back in the lobby after appt requesting blood pressure to be rechecked.

## 2013-03-02 NOTE — Telephone Encounter (Signed)
New Prob    Pt has some questions regarding BP, would like to speak to nurse.

## 2013-03-02 NOTE — Assessment & Plan Note (Signed)
Atrial fibrillation permanent. Unfortunately he is only ventricularly pacing 85% of the time. He underwent CRT would suggest improved outcomes with a greater degree of ventricular pacing. It is adamant about AV junction ablation. We'll add a low dose of bisoprolol.

## 2013-03-02 NOTE — Progress Notes (Signed)
Patient Care Team: Crist Fat as PCP - General (Specialist)   HPI  [name] is a 67 y.o. male Seen in followup for CRT-D implanted for congestive heart failure in the setting of ischemic heart disease. He underwent generator replacement April 2012 with replacement of his 6949 ICD lead.   .  Myoview April 2012 demonstrated no ischemia, inferolateral scar and an ejection fraction of 28%.  He has permanent atrial fibrillation. Rate control is adequate. He has been followed by the heart care clinic. He was hospitalized 11/13 for heart failure requiring milrinone.  He has been doing quite well. He continues to exercise.  Past Medical History  Diagnosis Date  . Ischemic cardiomyopathy     Myoview 02/2011  EF 28%  Coronary artery disease  (total occlusion of the right coronary artery, left-to-right )  . Atrial flutter     s/p TEE guided cardioversion  . Nonsustained ventricular tachycardia   . LBBB (left bundle branch block)   . Chronic drug-induced interstitial lung disorders   . Acute on chronic renal insufficiency   . Coronary artery disease     total occlusion of the right coronary artery, left to right collaterals.  He had Cypher stenting to the circumflex in  July 2003  . Seizure disorder   . AICD (automatic cardioverter/defibrillator) present     Medtronic CRT  . Hyponatremia   . 6949-lead     replaced 02/2011  . Hyperlipemia   . Hypertension   . Fatty liver     CT  . Family hx of colon cancer     2 SISTERS  . CHF (congestive heart failure)   . Asthma   . COPD (chronic obstructive pulmonary disease)     " very MILD "  . Headache     Past Surgical History  Procedure Laterality Date  . Insert / replace / remove pacemaker      AICD medtronic  . Hernia repair    . Esophagogastroduodenoscopy  09/13/2012    Procedure: ESOPHAGOGASTRODUODENOSCOPY (EGD);  Surgeon: Meryl Dare, MD,FACG;  Location: [facility];  Service: Endoscopy;  Laterality: N/A;  . Coronary  angioplasty      Current Outpatient Prescriptions  Medication Sig Dispense Refill  . Calcium-Magnesium 500-250 MG TABS Take 1 tablet by mouth daily.       Marland Kitchen co-enzyme Q-10 30 MG capsule Take 30 mg by mouth 3 (three) times daily.        . colchicine 0.6 MG tablet Take 0.6 mg by mouth daily as needed. For gout flareups.      . COREG CR 20 MG 24 hr capsule TAKE 1 CAPSULE BY MOUTH EVERY DAY  30 capsule  6  . digoxin (LANOXIN) 0.125 MG tablet Take 1 tablet (0.125 mg total) by mouth every other day.  15 tablet  10  . diphenhydrAMINE (BENADRYL) 25 mg capsule Take 25 mg by mouth every 6 (six) hours as needed. For allergies.      . famotidine (PEPCID) 40 MG tablet Take 1 tablet (40 mg total) by mouth 2 (two) times daily.  60 tablet  1  . fish oil-omega-3 fatty acids 1000 MG capsule Take 2 g by mouth daily.      . furosemide (LASIX) 40 MG tablet Take 1 tablet (40 mg total) by mouth daily.  60 tablet  6  . hydrALAZINE (APRESOLINE) 50 MG tablet TAKE 1 TABLET BY MOUTH EVERY 8 HOURS  90 tablet  6  . lisinopril (PRINIVIL,ZESTRIL)  20 MG tablet Take 1 tablet (20 mg total) by mouth daily.  30 tablet  6  . Multiple Vitamin (MULTIVITAMIN WITH MINERALS) TABS Take 1 tablet by mouth daily.      . nitroGLYCERIN (NITROSTAT) 0.4 MG SL tablet Place 0.4 mg under the tongue every 5 (five) minutes as needed. For chest pain      . ondansetron (ZOFRAN-ODT) 4 MG disintegrating tablet Take 4 mg by mouth every 4 (four) hours as needed. For nausea      . oxyCODONE-acetaminophen (PERCOCET/ROXICET) 5-325 MG per tablet Take 1 tablet by mouth every 6 (six) hours as needed. For pain      . potassium chloride SA (K-DUR,KLOR-CON) 20 MEQ tablet TAKE 1 TABLET BY MOUTH EVERY DAY  30 tablet  6  . spironolactone (ALDACTONE) 25 MG tablet TAKE 1/2 TABLET BY MOUTH EVERY DAY  15 tablet  6  . traMADol (ULTRAM) 50 MG tablet Take 50 mg by mouth every 6 (six) hours as needed. For pain.      . vitamin C (ASCORBIC ACID) 500 MG tablet Take 500 mg by  mouth daily.      Marland Kitchen warfarin (COUMADIN) 5 MG tablet Take 1 tablet (5 mg total) by mouth as directed.  45 tablet  3   No current facility-administered medications for this visit.    Allergies  Allergen Reactions  . Ace Inhibitors   . Amlodipine Besylate     Numbness to lips; made legs give out  . Codeine Itching  . Corzide (Nadolol-Bendroflumethiazide)   . Diovan (Valsartan)   . Flagyl (Metronidazole)   . Norvasc (Amlodipine Besylate)     Numbness to lips; made legs give out  . Omeprazole     Shut my kidneys down  . Other     Perfumes smells; paint smells; formaldehyde smells  . Penicillins   . Shellfish Allergy   . Latex Itching and Rash    Review of Systems negative except from HPI and PMH  Physical Exam BP 131/80  Pulse 80  Ht 5\' 8"  (1.727 m)  Wt 171 lb 9.6 oz (77.837 kg)  BMI 26.1 kg/m2 Well developed and nourished in no acute distress HENT normal Neck supple with JVP-flat Carotids brisk and full without bruits Clear Irregularly irregular rate and rhythm with controlled  ventricular response, no murmurs or gallops Abd-soft with active BS without hepatomegaly No Clubbing cyanosis edema Skin-warm and dry A & Oriented  Grossly normal sensory and motor function Leg raises was associated with an increase in the ventricular response   Assessment and  Plan

## 2013-03-02 NOTE — Assessment & Plan Note (Signed)
.  dfbn The patient's device was interrogated.  The information was reviewed. No changes were made in the programming.   As noted he is ventricularly paced only 85% of the time.

## 2013-03-02 NOTE — Assessment & Plan Note (Signed)
As above Spent > 25 minutes talking about the above

## 2013-03-02 NOTE — Telephone Encounter (Signed)
BP RECHECK WAS 134/80 will discuss with dr Graciela Husbands

## 2013-03-10 ENCOUNTER — Ambulatory Visit (INDEPENDENT_AMBULATORY_CARE_PROVIDER_SITE_OTHER): Payer: Medicare Other | Admitting: Cardiology

## 2013-03-10 ENCOUNTER — Encounter: Payer: Self-pay | Admitting: Cardiology

## 2013-03-10 VITALS — BP 122/74 | HR 79 | Ht 68.0 in | Wt 169.4 lb

## 2013-03-10 DIAGNOSIS — I2589 Other forms of chronic ischemic heart disease: Secondary | ICD-10-CM | POA: Diagnosis not present

## 2013-03-10 DIAGNOSIS — I472 Ventricular tachycardia: Secondary | ICD-10-CM

## 2013-03-10 DIAGNOSIS — I4891 Unspecified atrial fibrillation: Secondary | ICD-10-CM | POA: Diagnosis not present

## 2013-03-10 DIAGNOSIS — I255 Ischemic cardiomyopathy: Secondary | ICD-10-CM

## 2013-03-10 DIAGNOSIS — I5022 Chronic systolic (congestive) heart failure: Secondary | ICD-10-CM

## 2013-03-10 DIAGNOSIS — I2510 Atherosclerotic heart disease of native coronary artery without angina pectoris: Secondary | ICD-10-CM | POA: Diagnosis not present

## 2013-03-10 LAB — BASIC METABOLIC PANEL
BUN: 22 mg/dL (ref 6–23)
CO2: 28 mEq/L (ref 19–32)
Calcium: 8.9 mg/dL (ref 8.4–10.5)
Glucose, Bld: 87 mg/dL (ref 70–99)
Sodium: 130 mEq/L — ABNORMAL LOW (ref 135–145)

## 2013-03-10 NOTE — Progress Notes (Signed)
HPI   The patient presents for followup of his cardiomyopathy.  Since I last saw him he did see Dr. Graciela Husbands. He did note that Mr. Macgregor heart rate sometimes goes up particularly he thinks with exercise and that his only pacing 85% of the time. He added bisoprolol and discuss further med titration and even AV node ablation.  The patient tolerated the addition of bisoprolol. He does get occasional lightheadedness with exercise but he feels well exercising. He's not describing tachycardia palpitations. He's not having chest pressure, neck or arm discomfort. He thinks his breathing is much improved compared with earlier when he was hospitalized. He is very anxious about further medications for up titration and would not want to consider further invasive procedures.  Allergies  Allergen Reactions  . Ace Inhibitors   . Amlodipine Besylate     Numbness to lips; made legs give out  . Codeine Itching  . Corzide (Nadolol-Bendroflumethiazide)   . Diovan (Valsartan)   . Flagyl (Metronidazole)   . Norvasc (Amlodipine Besylate)     Numbness to lips; made legs give out  . Omeprazole     Shut my kidneys down  . Other     Perfumes smells; paint smells; formaldehyde smells  . Penicillins   . Shellfish Allergy   . Latex Itching and Rash    Current Outpatient Prescriptions  Medication Sig Dispense Refill  . Ascorbic Acid (VITAMIN C) POWD Takes 1/8 of a teaspoon 1-2 times a day      . bisoprolol (ZEBETA) 5 MG tablet Take 0.5 tablets (2.5 mg total) by mouth daily.  30 tablet  12  . Calcium-Magnesium 500-250 MG TABS Take 1 tablet by mouth daily.       Marland Kitchen co-enzyme Q-10 30 MG capsule Take 30 mg by mouth 3 (three) times daily.        . colchicine 0.6 MG tablet Take 0.6 mg by mouth daily as needed. For gout flareups.      . COREG CR 20 MG 24 hr capsule TAKE 1 CAPSULE BY MOUTH EVERY DAY  30 capsule  6  . digoxin (LANOXIN) 0.125 MG tablet Take 1 tablet (0.125 mg total) by mouth every other day.  15 tablet  10    . diphenhydrAMINE (BENADRYL) 25 mg capsule Take 25 mg by mouth every 6 (six) hours as needed. For allergies.      . famotidine (PEPCID) 40 MG tablet Take 1 tablet (40 mg total) by mouth 2 (two) times daily.  60 tablet  1  . fish oil-omega-3 fatty acids 1000 MG capsule Take 2 g by mouth daily.      . furosemide (LASIX) 40 MG tablet Take 1 tablet (40 mg total) by mouth daily.  60 tablet  6  . hydrALAZINE (APRESOLINE) 50 MG tablet TAKE 1 TABLET BY MOUTH EVERY 8 HOURS  90 tablet  6  . lisinopril (PRINIVIL,ZESTRIL) 20 MG tablet Take 1 tablet (20 mg total) by mouth daily.  30 tablet  6  . Multiple Vitamin (MULTIVITAMIN WITH MINERALS) TABS Take 1 tablet by mouth daily.      . nitroGLYCERIN (NITROSTAT) 0.4 MG SL tablet Place 0.4 mg under the tongue every 5 (five) minutes as needed. For chest pain      . ondansetron (ZOFRAN-ODT) 4 MG disintegrating tablet Take 4 mg by mouth every 4 (four) hours as needed. For nausea      . oxyCODONE-acetaminophen (PERCOCET/ROXICET) 5-325 MG per tablet Take 1 tablet by mouth every 6 (six)  hours as needed. For pain      . potassium chloride SA (K-DUR,KLOR-CON) 20 MEQ tablet TAKE 1 TABLET BY MOUTH EVERY DAY  30 tablet  6  . spironolactone (ALDACTONE) 25 MG tablet TAKE 1/2 TABLET BY MOUTH EVERY DAY  15 tablet  6  . traMADol (ULTRAM) 50 MG tablet Take 50 mg by mouth every 6 (six) hours as needed. For pain.      Marland Kitchen warfarin (JANTOVEN) 5 MG tablet Take 1 tablet (5 mg total) by mouth as directed. Take as directed by coumadin clinic no substitute  Use jantoven  45 tablet  3   No current facility-administered medications for this visit.    Past Medical History  Diagnosis Date  . Ischemic cardiomyopathy     Myoview 02/2011  EF 28%  Coronary artery disease  (total occlusion of the right coronary artery, left-to-right )  . Atrial flutter     s/p TEE guided cardioversion  . Nonsustained ventricular tachycardia   . LBBB (left bundle branch block)   . Chronic drug-induced  interstitial lung disorders   . Acute on chronic renal insufficiency   . Coronary artery disease     total occlusion of the right coronary artery, left to right collaterals.  He had Cypher stenting to the circumflex in  July 2003  . Seizure disorder   . AICD (automatic cardioverter/defibrillator) present     Medtronic CRT  . Hyponatremia   . 6949-lead     replaced 02/2011  . Hyperlipemia   . Hypertension   . Fatty liver     CT  . Family hx of colon cancer     2 SISTERS  . CHF (congestive heart failure)   . Asthma   . COPD (chronic obstructive pulmonary disease)     " very MILD "  . Headache     Past Surgical History  Procedure Laterality Date  . Insert / replace / remove pacemaker      AICD medtronic  . Hernia repair    . Esophagogastroduodenoscopy  09/13/2012    Procedure: ESOPHAGOGASTRODUODENOSCOPY (EGD);  Surgeon: Meryl Dare, MD,FACG;  Location: [facility];  Service: Endoscopy;  Laterality: N/A;  . Coronary angioplasty      ROS:  Right knee pain.  Otherwise as stated in the HPI and negative for all other systems.  PHYSICAL EXAM BP 122/74  Pulse 79  Ht 5\' 8"  (1.727 m)  Wt 169 lb 6.4 oz (76.839 kg)  BMI 25.76 kg/m2 GENERAL:  Well appearing HEENT:  Pupils equal round and reactive, fundi not visualized, oral mucosa unremarkable NECK:  No jugular venous distention, waveform within normal limits, carotid upstroke brisk and symmetric, no bruits, no thyromegaly LUNGS:  Clear to auscultation bilaterally CHEST:  ICD pocket well healed. HEART:  PMI not displaced or sustained,S1 and S2 within normal limits, no S3, no S4, no clicks, no rubs, no murmurs ABD:  Flat, positive bowel sounds normal in frequency in pitch, no bruits, no rebound, no guarding, no midline pulsatile mass, no hepatomegaly, no splenomegaly EXT:  2 plus pulses throughout, no edema, no cyanosis no clubbing   ASSESSMENT AND PLAN  Chronic Systolic CHF:  He is feeling well. He has been very sensitive to  med titration past and really does not want this or other procedures as long as he is feeling well and I cannot argue with this. At this point he will remain on the meds as listed  Coronary Artery Disease: The patient has no new  sypmtoms.  No further cardiovascular testing is indicated.  We will continue with aggressive risk reduction and meds as listed.  Hypertension: This is being managed in the context of treating his CHF  Atrial Flutter: As above request no further med titration or invasive procedures. He tolerates anticoagulation. We can reassess his rate in the future possibly with treadmill testing or monitoring or when he has his device re\re interrogated  Chronic Kidney Disease: Repeat BMET pending today.

## 2013-03-10 NOTE — Patient Instructions (Addendum)
The current medical regimen is effective;  continue present plan and medications.  Please have blood work today (BMP)  Follow up in 4 months with Dr Antoine Poche.

## 2013-03-22 ENCOUNTER — Ambulatory Visit (INDEPENDENT_AMBULATORY_CARE_PROVIDER_SITE_OTHER): Payer: Medicare Other | Admitting: Pharmacist

## 2013-03-22 DIAGNOSIS — Z7901 Long term (current) use of anticoagulants: Secondary | ICD-10-CM

## 2013-03-22 DIAGNOSIS — I4891 Unspecified atrial fibrillation: Secondary | ICD-10-CM | POA: Diagnosis not present

## 2013-03-22 DIAGNOSIS — I4892 Unspecified atrial flutter: Secondary | ICD-10-CM

## 2013-03-24 DIAGNOSIS — IMO0002 Reserved for concepts with insufficient information to code with codable children: Secondary | ICD-10-CM | POA: Diagnosis not present

## 2013-03-24 DIAGNOSIS — M9981 Other biomechanical lesions of cervical region: Secondary | ICD-10-CM | POA: Diagnosis not present

## 2013-03-24 DIAGNOSIS — M999 Biomechanical lesion, unspecified: Secondary | ICD-10-CM | POA: Diagnosis not present

## 2013-04-12 ENCOUNTER — Encounter (HOSPITAL_COMMUNITY): Payer: Self-pay

## 2013-04-12 ENCOUNTER — Ambulatory Visit (HOSPITAL_COMMUNITY)
Admission: RE | Admit: 2013-04-12 | Discharge: 2013-04-12 | Disposition: A | Discharge status: Home / Self Care | Payer: Medicare Other | Source: Ambulatory Visit | Attending: Internal Medicine | Admitting: Internal Medicine

## 2013-04-12 VITALS — BP 90/62 | HR 84 | Wt 166.4 lb

## 2013-04-12 DIAGNOSIS — I509 Heart failure, unspecified: Secondary | ICD-10-CM | POA: Insufficient documentation

## 2013-04-12 DIAGNOSIS — I5022 Chronic systolic (congestive) heart failure: Secondary | ICD-10-CM | POA: Diagnosis not present

## 2013-04-12 NOTE — Patient Instructions (Addendum)
Follow up in 6 months  Do the following things EVERYDAY: 1) Weigh yourself in the morning before breakfast. Write it down and keep it in a log. 2) Take your medicines as prescribed 3) Eat low salt foods-Limit salt (sodium) to 2000 mg per day.  4) Stay as active as you can everyday 5) Limit all fluids for the day to less than 2 liters 

## 2013-04-17 NOTE — Progress Notes (Signed)
Patient ID: [name], male   DOB: 07/11/1946, 67 y.o.   MRN: [id number]  Primary cardiologist: Dr. Antoine Poche PCP: Dr Eligah East  HPI: 67 y.o. male w/ PMHx significant for CAD (s/p DES to LCx '03, residual total occlusion of RCA w/ collaterals, normal myoview 2012), ICM (EF 25%, s/p Medtronic BiV ICD), Atrial Fib/Flutter (s/p DCCV, on coumadin), HTN, HLD, CKD, hyponatremia, and seizure disorder  Admitted to [facility] 09/16/12 for ongoing ab pain thought to be related to HF. CT scan showed diffuse mesenteric calcification but pain not felt to be intestinal claudication. D/C from [facility] 09/23/12. Presented with NYHA III-IV symptoms. Required Milrionone due to low output heart failure. Diuresed 14 pounds. Discharge weight 155 pounds.   RHC 09/17/2012  RA = 22  RV = 67/17/22  PA = 65/41 (50)  PCW = 38 (v-waves to 45)  Fick cardiac output/index = 3.8/2.0  Thermo CO/CI = 2.3/1.2  PVR = 5.0 woods  FA sat = 92%  PA sat = 51%, 52%   CPX 12/4 /13 Peak VO2: 17.1 % predicted peak VO2: 59.7% VE/VCO2 slope: 25.7 OUES: 2.13 Peak RER: 1.02 Ventilatory Threshold: 15 % predicted peak VO2: 52.4% Test was stopped early due excessive ventricular ectopy, afib with RVR and significant fall in BP.  He returns for follow up with his wife.  Dr Graciela Husbands added bisoprolol at last office visit. Denies SOB/PND/Orthopnea. Weight at home 162-164 pounds. Compliant with medications. BP drops during exercise.    ROS: All systems negative except as listed in HPI, PMH and Problem List.  Past Medical History  Diagnosis Date  . Ischemic cardiomyopathy     Myoview 02/2011  EF 28%  Coronary artery disease  (total occlusion of the right coronary artery, left-to-right )  . Atrial flutter     s/p TEE guided cardioversion  . Nonsustained ventricular tachycardia   . LBBB (left bundle branch block)   . Chronic drug-induced interstitial lung disorders   . Acute on chronic renal insufficiency   . Coronary artery disease     total occlusion  of the right coronary artery, left to right collaterals.  He had Cypher stenting to the circumflex in  July 2003  . Seizure disorder   . AICD (automatic cardioverter/defibrillator) present     Medtronic CRT  . Hyponatremia   . 6949-lead     replaced 02/2011  . Hyperlipemia   . Hypertension   . Fatty liver     CT  . Family hx of colon cancer     2 SISTERS  . CHF (congestive heart failure)   . Asthma   . COPD (chronic obstructive pulmonary disease)     " very MILD "  . AOZHYQMV(784.6)     Current Outpatient Prescriptions  Medication Sig Dispense Refill  . Ascorbic Acid (VITAMIN C) POWD Takes 1/8 of a teaspoon 1-2 times a day      . bisoprolol (ZEBETA) 5 MG tablet Take 0.5 tablets (2.5 mg total) by mouth daily.  30 tablet  12  . Calcium-Magnesium 500-250 MG TABS Take 1 tablet by mouth daily.       Marland Kitchen co-enzyme Q-10 30 MG capsule Take 30 mg by mouth 3 (three) times daily.        . colchicine 0.6 MG tablet Take 0.6 mg by mouth daily as needed. For gout flareups.      . COREG CR 20 MG 24 hr capsule TAKE 1 CAPSULE BY MOUTH EVERY DAY  30 capsule  6  .  digoxin (LANOXIN) 0.125 MG tablet Take 1 tablet (0.125 mg total) by mouth every other day.  15 tablet  10  . diphenhydrAMINE (BENADRYL) 25 mg capsule Take 25 mg by mouth every 6 (six) hours as needed. For allergies.      . famotidine (PEPCID) 40 MG tablet Take 1 tablet (40 mg total) by mouth 2 (two) times daily.  60 tablet  1  . fish oil-omega-3 fatty acids 1000 MG capsule Take 2 g by mouth daily.      . furosemide (LASIX) 40 MG tablet Take 1 tablet (40 mg total) by mouth daily.  60 tablet  6  . hydrALAZINE (APRESOLINE) 50 MG tablet TAKE 1 TABLET BY MOUTH EVERY 8 HOURS  90 tablet  6  . lisinopril (PRINIVIL,ZESTRIL) 20 MG tablet Take 1 tablet (20 mg total) by mouth daily.  30 tablet  6  . Multiple Vitamin (MULTIVITAMIN WITH MINERALS) TABS Take 1 tablet by mouth daily.      . nitroGLYCERIN (NITROSTAT) 0.4 MG SL tablet Place 0.4 mg under the  tongue every 5 (five) minutes as needed. For chest pain      . ondansetron (ZOFRAN-ODT) 4 MG disintegrating tablet Take 4 mg by mouth every 4 (four) hours as needed. For nausea      . oxyCODONE-acetaminophen (PERCOCET/ROXICET) 5-325 MG per tablet Take 1 tablet by mouth every 6 (six) hours as needed. For pain      . potassium chloride SA (K-DUR,KLOR-CON) 20 MEQ tablet TAKE 1 TABLET BY MOUTH EVERY DAY  30 tablet  6  . spironolactone (ALDACTONE) 25 MG tablet TAKE 1/2 TABLET BY MOUTH EVERY DAY  15 tablet  6  . traMADol (ULTRAM) 50 MG tablet Take 50 mg by mouth every 6 (six) hours as needed. For pain.      Marland Kitchen warfarin (JANTOVEN) 5 MG tablet Take 1 tablet (5 mg total) by mouth as directed. Take as directed by coumadin clinic no substitute  Use jantoven  45 tablet  3   No current facility-administered medications for this encounter.     PHYSICAL EXAM: Filed Vitals:   04/12/13 1113  BP: 90/62  Pulse: 84   General:  Thin. Well appearing. No resp difficulty wife present HEENT: normal Neck: supple. JVP 6-7 Carotids 2+ bilaterally; no bruits. No lymphadenopathy or thryomegaly appreciated. Cor: PMI normal. Regular rate & rhythm. No rubs, gallops or murmurs. Lungs: clear Abdomen: soft, nontender, nondistended. No hepatosplenomegaly. No bruits or masses. Good bowel sounds. Extremities: no cyanosis, clubbing, rash, edema Neuro: alert & orientedx3, cranial nerves grossly intact. Moves all 4 extremities w/o difficulty. Affect pleasant.     ASSESSMENT & PLAN:

## 2013-04-17 NOTE — Assessment & Plan Note (Signed)
Continues to do very well. NYHA II-early III. Volume status looks good. Reinforced need for daily weights and reviewed use of sliding scale diuretics. We will continue current regimen. He will f/u with Dr. Antoine Poche on a regular basis. We will see him 2x/year to check in. He will call us if symptoms progressing.

## 2013-04-20 ENCOUNTER — Ambulatory Visit (INDEPENDENT_AMBULATORY_CARE_PROVIDER_SITE_OTHER): Payer: Medicare Other | Admitting: Pharmacist

## 2013-04-20 DIAGNOSIS — I4891 Unspecified atrial fibrillation: Secondary | ICD-10-CM

## 2013-04-20 DIAGNOSIS — I4892 Unspecified atrial flutter: Secondary | ICD-10-CM

## 2013-04-20 DIAGNOSIS — Z7901 Long term (current) use of anticoagulants: Secondary | ICD-10-CM

## 2013-06-01 ENCOUNTER — Other Ambulatory Visit: Payer: Self-pay | Admitting: Pharmacist

## 2013-06-01 ENCOUNTER — Ambulatory Visit (INDEPENDENT_AMBULATORY_CARE_PROVIDER_SITE_OTHER): Payer: Medicare Other | Admitting: *Deleted

## 2013-06-01 DIAGNOSIS — I4891 Unspecified atrial fibrillation: Secondary | ICD-10-CM

## 2013-06-01 DIAGNOSIS — I4892 Unspecified atrial flutter: Secondary | ICD-10-CM | POA: Diagnosis not present

## 2013-06-01 DIAGNOSIS — Z7901 Long term (current) use of anticoagulants: Secondary | ICD-10-CM | POA: Diagnosis not present

## 2013-06-01 LAB — POCT INR: INR: 2.8

## 2013-06-01 MED ORDER — WARFARIN SODIUM 5 MG PO TABS: 5.0000 mg | ORAL_TABLET | ORAL | Status: DC

## 2013-06-06 ENCOUNTER — Ambulatory Visit (INDEPENDENT_AMBULATORY_CARE_PROVIDER_SITE_OTHER): Payer: Medicare Other | Admitting: *Deleted

## 2013-06-06 ENCOUNTER — Encounter: Payer: Self-pay | Admitting: Internal Medicine

## 2013-06-06 DIAGNOSIS — I2589 Other forms of chronic ischemic heart disease: Secondary | ICD-10-CM

## 2013-06-06 DIAGNOSIS — Z95810 Presence of automatic (implantable) cardiac defibrillator: Secondary | ICD-10-CM

## 2013-06-06 DIAGNOSIS — I255 Ischemic cardiomyopathy: Secondary | ICD-10-CM

## 2013-06-07 ENCOUNTER — Telehealth: Payer: Self-pay | Admitting: Cardiology

## 2013-06-07 NOTE — Telephone Encounter (Signed)
Transmission received, patient aware. 

## 2013-06-07 NOTE — Telephone Encounter (Signed)
Follow Up     Pt returning call regarding his device.

## 2013-06-08 LAB — REMOTE ICD DEVICE
AL AMPLITUDE: 1.1 mv
AL THRESHOLD: 0.625 V
BATTERY VOLTAGE: 3.0741 V
LV LEAD IMPEDENCE ICD: 589 Ohm
RV LEAD IMPEDENCE ICD: 513 Ohm
RV LEAD THRESHOLD: 0.625 V
TOT-0006: 20120419000000
TZAT-0001ATACH: 2
TZAT-0001ATACH: 3
TZAT-0001SLOWVT: 1
TZAT-0001SLOWVT: 2
TZAT-0002ATACH: NEGATIVE
TZAT-0002ATACH: NEGATIVE
TZAT-0002FASTVT: NEGATIVE
TZAT-0005SLOWVT: 84 pct
TZAT-0005SLOWVT: 91 pct
TZAT-0012ATACH: 150 ms
TZAT-0012FASTVT: 200 ms
TZAT-0018ATACH: NEGATIVE
TZAT-0018ATACH: NEGATIVE
TZAT-0018FASTVT: NEGATIVE
TZAT-0018SLOWVT: NEGATIVE
TZAT-0018SLOWVT: NEGATIVE
TZAT-0019ATACH: 6 V
TZAT-0019ATACH: 6 V
TZAT-0019FASTVT: 8 V
TZAT-0019SLOWVT: 8 V
TZAT-0020SLOWVT: 1.5 ms
TZON-0003VSLOWVT: 450 ms
TZON-0004VSLOWVT: 32
TZON-0005SLOWVT: 12
TZST-0001ATACH: 5
TZST-0001ATACH: 6
TZST-0001FASTVT: 5
TZST-0001FASTVT: 6
TZST-0001SLOWVT: 3
TZST-0001SLOWVT: 5
TZST-0002ATACH: NEGATIVE
TZST-0002FASTVT: NEGATIVE
TZST-0002FASTVT: NEGATIVE
TZST-0003SLOWVT: 35 J
VF: 0

## 2013-06-21 ENCOUNTER — Other Ambulatory Visit (HOSPITAL_COMMUNITY): Payer: Self-pay | Admitting: Adult Health

## 2013-07-05 NOTE — Telephone Encounter (Signed)
This encounter was created in error - please disregard.

## 2013-07-12 ENCOUNTER — Encounter: Payer: Self-pay | Admitting: Cardiology

## 2013-07-12 ENCOUNTER — Ambulatory Visit (INDEPENDENT_AMBULATORY_CARE_PROVIDER_SITE_OTHER): Payer: Medicare Other | Admitting: Cardiology

## 2013-07-12 ENCOUNTER — Ambulatory Visit (INDEPENDENT_AMBULATORY_CARE_PROVIDER_SITE_OTHER): Payer: Medicare Other | Admitting: Pharmacist

## 2013-07-12 VITALS — BP 125/70 | HR 63 | Ht 68.0 in

## 2013-07-12 DIAGNOSIS — Z7901 Long term (current) use of anticoagulants: Secondary | ICD-10-CM | POA: Diagnosis not present

## 2013-07-12 DIAGNOSIS — I2589 Other forms of chronic ischemic heart disease: Secondary | ICD-10-CM | POA: Diagnosis not present

## 2013-07-12 DIAGNOSIS — I4892 Unspecified atrial flutter: Secondary | ICD-10-CM | POA: Diagnosis not present

## 2013-07-12 DIAGNOSIS — I4891 Unspecified atrial fibrillation: Secondary | ICD-10-CM | POA: Diagnosis not present

## 2013-07-12 DIAGNOSIS — N259 Disorder resulting from impaired renal tubular function, unspecified: Secondary | ICD-10-CM

## 2013-07-12 DIAGNOSIS — I5022 Chronic systolic (congestive) heart failure: Secondary | ICD-10-CM

## 2013-07-12 LAB — BASIC METABOLIC PANEL
Chloride: 100 mEq/L (ref 96–112)
Creatinine, Ser: 1.7 mg/dL — ABNORMAL HIGH (ref 0.4–1.5)
GFR: 43.31 mL/min — ABNORMAL LOW (ref >60.00)
Potassium: 4.7 mEq/L (ref 3.5–5.1)

## 2013-07-12 LAB — POCT INR: INR: 2.6

## 2013-07-12 NOTE — Patient Instructions (Addendum)
The current medical regimen is effective;  continue present plan and medications.  Please have blood work today (BMP)  Your physician has requested that you have an echocardiogram. Echocardiography is a painless test that uses sound waves to create images of your heart. It provides your doctor with information about the size and shape of your heart and how well your heart's chambers and valves are working. This procedure takes approximately one hour. There are no restrictions for this procedure.  Follow up in 6 months with Dr Antoine Poche.  You will receive a letter in the mail 2 months before you are due.  Please call us when you receive this letter to schedule your follow up appointment.

## 2013-07-12 NOTE — Progress Notes (Signed)
HPI   The patient presents for followup of his cardiomyopathy.  Since I last saw him he has had no acute cardiovascular complaints. He said some sinus trouble. He has had gout he thinks. He denies any acute cardiovascular symptoms. He's not having any new shortness of breath, PND or orthopnea. He's not having any new palpitations, presyncope or syncope. He has had no weight gain or edema. He has had no chest pressure, neck or arm discomfort.  Allergies  Allergen Reactions  . Ace Inhibitors   . Amlodipine Besylate     Numbness to lips; made legs give out  . Codeine Itching  . Corzide [Nadolol-Bendroflumethiazide]   . Diovan [Valsartan]   . Flagyl [Metronidazole]   . Norvasc [Amlodipine Besylate]     Numbness to lips; made legs give out  . Omeprazole     Shut my kidneys down  . Other     Perfumes smells; paint smells; formaldehyde smells  . Penicillins   . Shellfish Allergy   . Latex Itching and Rash    Current Outpatient Prescriptions  Medication Sig Dispense Refill  . Ascorbic Acid (VITAMIN C) POWD Takes 1/8 of a teaspoon 1-2 times a day      . bisoprolol (ZEBETA) 5 MG tablet Take 0.5 tablets (2.5 mg total) by mouth daily.  30 tablet  12  . Calcium-Magnesium 500-250 MG TABS Take 1 tablet by mouth daily.       Marland Kitchen co-enzyme Q-10 30 MG capsule Take 30 mg by mouth 3 (three) times daily.        . colchicine 0.6 MG tablet Take 0.6 mg by mouth daily as needed. For gout flareups.      . COREG CR 20 MG 24 hr capsule TAKE 1 CAPSULE BY MOUTH EVERY DAY  30 capsule  6  . digoxin (LANOXIN) 0.125 MG tablet Take 1 tablet (0.125 mg total) by mouth every other day.  15 tablet  10  . diphenhydrAMINE (BENADRYL) 25 mg capsule Take 25 mg by mouth every 6 (six) hours as needed. For allergies.      . famotidine (PEPCID) 40 MG tablet Take 1 tablet (40 mg total) by mouth 2 (two) times daily.  60 tablet  1  . fish oil-omega-3 fatty acids 1000 MG capsule Take 2 g by mouth daily.      . furosemide (LASIX)  40 MG tablet TAKE 1 TABLET BY MOUTH TWICE DAILY  60 tablet  3  . hydrALAZINE (APRESOLINE) 50 MG tablet TAKE 1 TABLET BY MOUTH EVERY 8 HOURS  90 tablet  6  . lisinopril (PRINIVIL,ZESTRIL) 20 MG tablet Take 1 tablet (20 mg total) by mouth daily.  30 tablet  6  . Multiple Vitamin (MULTIVITAMIN WITH MINERALS) TABS Take 1 tablet by mouth daily.      . nitroGLYCERIN (NITROSTAT) 0.4 MG SL tablet Place 0.4 mg under the tongue every 5 (five) minutes as needed. For chest pain      . ondansetron (ZOFRAN-ODT) 4 MG disintegrating tablet Take 4 mg by mouth every 4 (four) hours as needed. For nausea      . oxyCODONE-acetaminophen (PERCOCET/ROXICET) 5-325 MG per tablet Take 1 tablet by mouth every 6 (six) hours as needed. For pain      . potassium chloride SA (K-DUR,KLOR-CON) 20 MEQ tablet TAKE 1 TABLET BY MOUTH EVERY DAY  30 tablet  6  . spironolactone (ALDACTONE) 25 MG tablet TAKE 1/2 TABLET BY MOUTH EVERY DAY  15 tablet  6  .  traMADol (ULTRAM) 50 MG tablet Take 50 mg by mouth every 6 (six) hours as needed. For pain.      Marland Kitchen warfarin (JANTOVEN) 5 MG tablet Take 1 tablet (5 mg total) by mouth as directed. Take as directed by coumadin clinic no substitute  Use jantoven  45 tablet  3   No current facility-administered medications for this visit.    Past Medical History  Diagnosis Date  . Ischemic cardiomyopathy     Myoview 02/2011  EF 28%  Coronary artery disease  (total occlusion of the right coronary artery, left-to-right )  . Atrial flutter     s/p TEE guided cardioversion  . Nonsustained ventricular tachycardia   . LBBB (left bundle branch block)   . Chronic drug-induced interstitial lung disorders   . Acute on chronic renal insufficiency   . Coronary artery disease     total occlusion of the right coronary artery, left to right collaterals.  He had Cypher stenting to the circumflex in  July 2003  . Seizure disorder   . AICD (automatic cardioverter/defibrillator) present     Medtronic CRT  .  Hyponatremia   . 6949-lead     replaced 02/2011  . Hyperlipemia   . Hypertension   . Fatty liver     CT  . Family hx of colon cancer     2 SISTERS  . CHF (congestive heart failure)   . Asthma   . COPD (chronic obstructive pulmonary disease)     " very MILD "  . ZOXWRUEA(540.9)     Past Surgical History  Procedure Laterality Date  . Insert / replace / remove pacemaker      AICD medtronic  . Hernia repair    . Esophagogastroduodenoscopy  09/13/2012    Procedure: ESOPHAGOGASTRODUODENOSCOPY (EGD);  Surgeon: Meryl Dare, MD,FACG;  Location: [facility];  Service: Endoscopy;  Laterality: N/A;  . Coronary angioplasty      ROS:  Right knee pain.  Otherwise as stated in the HPI and negative for all other systems.  PHYSICAL EXAM BP 125/70  Pulse 63  Ht 5\' 8"  (1.727 m)  SpO2 98% GENERAL:  Well appearing HEENT:  Pupils equal round and reactive, fundi not visualized, oral mucosa unremarkable NECK:  No jugular venous distention, waveform within normal limits, carotid upstroke brisk and symmetric, no bruits, no thyromegaly LUNGS:  Clear to auscultation bilaterally CHEST:  ICD pocket well healed. HEART:  PMI not displaced or sustained,S1 and S2 within normal limits, no S3, no S4, no clicks, no rubs, no murmurs ABD:  Flat, positive bowel sounds normal in frequency in pitch, no bruits, no rebound, no guarding, no midline pulsatile mass, no hepatomegaly, no splenomegaly EXT:  2 plus pulses throughout, no edema, no cyanosis no clubbing  EKG:  Atrial fibrillation with ventricular pacing rate 63.  07/12/2013  ASSESSMENT AND PLAN  Chronic Systolic CHF:  He is on an unusual regimen with 2 beta blockers but this was prescribed by Dr. Graciela Husbands for rate management and he seems to be doing well with this. I will check an echocardiogram and a basic metabolic profile. However, I will not change his medications at this point. He does have occasional lightheadedness that seems to tolerate  this.  Coronary Artery Disease: The patient has no new sypmtoms.  No further cardiovascular testing is indicated.  We will continue with aggressive risk reduction and meds as listed.  Hypertension:   As above the pressure has been low rather than high. He will  continue the meds as listed.  Atrial Flutter: As above request no further med titration or invasive procedures. He tolerates anticoagulation.   Chronic Kidney Disease: Repeat BMET pending today.

## 2013-07-13 ENCOUNTER — Encounter: Payer: Self-pay | Admitting: *Deleted

## 2013-07-14 ENCOUNTER — Other Ambulatory Visit: Payer: Self-pay | Admitting: *Deleted

## 2013-07-14 ENCOUNTER — Telehealth: Payer: Self-pay | Admitting: Cardiology

## 2013-07-14 DIAGNOSIS — M999 Biomechanical lesion, unspecified: Secondary | ICD-10-CM | POA: Diagnosis not present

## 2013-07-14 DIAGNOSIS — N289 Disorder of kidney and ureter, unspecified: Secondary | ICD-10-CM

## 2013-07-14 DIAGNOSIS — E871 Hypo-osmolality and hyponatremia: Secondary | ICD-10-CM

## 2013-07-14 DIAGNOSIS — M9981 Other biomechanical lesions of cervical region: Secondary | ICD-10-CM | POA: Diagnosis not present

## 2013-07-14 DIAGNOSIS — E876 Hypokalemia: Secondary | ICD-10-CM

## 2013-07-14 DIAGNOSIS — IMO0002 Reserved for concepts with insufficient information to code with codable children: Secondary | ICD-10-CM | POA: Diagnosis not present

## 2013-07-14 NOTE — Telephone Encounter (Signed)
Follow Up ° ° ° ° ° °Pt calling returning call. Please call back. °

## 2013-07-14 NOTE — Telephone Encounter (Signed)
Pt aware of results and to repeat lab in 1 month

## 2013-08-02 ENCOUNTER — Telehealth: Payer: Self-pay

## 2013-08-02 NOTE — Telephone Encounter (Signed)
pharm called out of coreg Cr 20 mg per DR Antoine Poche  can change it to coreg 6.25 mg bid

## 2013-08-03 DIAGNOSIS — R51 Headache: Secondary | ICD-10-CM | POA: Diagnosis not present

## 2013-08-16 ENCOUNTER — Other Ambulatory Visit (HOSPITAL_COMMUNITY): Payer: Self-pay | Admitting: Adult Health

## 2013-08-24 ENCOUNTER — Encounter (INDEPENDENT_AMBULATORY_CARE_PROVIDER_SITE_OTHER): Payer: Self-pay

## 2013-08-24 ENCOUNTER — Ambulatory Visit (INDEPENDENT_AMBULATORY_CARE_PROVIDER_SITE_OTHER): Payer: Medicare Other | Admitting: Pharmacist

## 2013-08-24 ENCOUNTER — Other Ambulatory Visit (HOSPITAL_COMMUNITY): Payer: Self-pay | Admitting: Cardiology

## 2013-08-24 ENCOUNTER — Ambulatory Visit (INDEPENDENT_AMBULATORY_CARE_PROVIDER_SITE_OTHER): Payer: Medicare Other | Admitting: *Deleted

## 2013-08-24 ENCOUNTER — Ambulatory Visit (HOSPITAL_COMMUNITY): Payer: Medicare Other | Attending: Cardiology | Admitting: Cardiology

## 2013-08-24 DIAGNOSIS — I4892 Unspecified atrial flutter: Secondary | ICD-10-CM

## 2013-08-24 DIAGNOSIS — I4891 Unspecified atrial fibrillation: Secondary | ICD-10-CM

## 2013-08-24 DIAGNOSIS — I447 Left bundle-branch block, unspecified: Secondary | ICD-10-CM | POA: Diagnosis not present

## 2013-08-24 DIAGNOSIS — I5021 Acute systolic (congestive) heart failure: Secondary | ICD-10-CM

## 2013-08-24 DIAGNOSIS — I2589 Other forms of chronic ischemic heart disease: Secondary | ICD-10-CM | POA: Insufficient documentation

## 2013-08-24 DIAGNOSIS — I501 Left ventricular failure: Secondary | ICD-10-CM | POA: Insufficient documentation

## 2013-08-24 DIAGNOSIS — E876 Hypokalemia: Secondary | ICD-10-CM | POA: Diagnosis not present

## 2013-08-24 DIAGNOSIS — I5022 Chronic systolic (congestive) heart failure: Secondary | ICD-10-CM

## 2013-08-24 DIAGNOSIS — Z7901 Long term (current) use of anticoagulants: Secondary | ICD-10-CM

## 2013-08-24 DIAGNOSIS — I379 Nonrheumatic pulmonary valve disorder, unspecified: Secondary | ICD-10-CM | POA: Insufficient documentation

## 2013-08-24 DIAGNOSIS — I10 Essential (primary) hypertension: Secondary | ICD-10-CM | POA: Diagnosis not present

## 2013-08-24 DIAGNOSIS — E785 Hyperlipidemia, unspecified: Secondary | ICD-10-CM | POA: Diagnosis not present

## 2013-08-24 DIAGNOSIS — I079 Rheumatic tricuspid valve disease, unspecified: Secondary | ICD-10-CM | POA: Diagnosis not present

## 2013-08-24 DIAGNOSIS — N289 Disorder of kidney and ureter, unspecified: Secondary | ICD-10-CM | POA: Diagnosis not present

## 2013-08-24 DIAGNOSIS — E871 Hypo-osmolality and hyponatremia: Secondary | ICD-10-CM

## 2013-08-24 DIAGNOSIS — I509 Heart failure, unspecified: Secondary | ICD-10-CM | POA: Diagnosis not present

## 2013-08-24 LAB — BASIC METABOLIC PANEL
BUN: 25 mg/dL — ABNORMAL HIGH (ref 6–23)
CO2: 26 mEq/L (ref 19–32)
Chloride: 103 mEq/L (ref 96–112)
Creatinine, Ser: 1.5 mg/dL (ref 0.4–1.5)
Potassium: 4.5 mEq/L (ref 3.5–5.1)

## 2013-08-24 NOTE — Progress Notes (Signed)
Echo performed. 

## 2013-09-12 ENCOUNTER — Ambulatory Visit (INDEPENDENT_AMBULATORY_CARE_PROVIDER_SITE_OTHER): Payer: Medicare Other | Admitting: *Deleted

## 2013-09-12 ENCOUNTER — Encounter: Payer: Self-pay | Admitting: Internal Medicine

## 2013-09-12 DIAGNOSIS — I5022 Chronic systolic (congestive) heart failure: Secondary | ICD-10-CM | POA: Diagnosis not present

## 2013-09-12 DIAGNOSIS — I2589 Other forms of chronic ischemic heart disease: Secondary | ICD-10-CM

## 2013-09-12 DIAGNOSIS — Z95810 Presence of automatic (implantable) cardiac defibrillator: Secondary | ICD-10-CM

## 2013-09-12 DIAGNOSIS — I255 Ischemic cardiomyopathy: Secondary | ICD-10-CM

## 2013-09-14 ENCOUNTER — Other Ambulatory Visit: Payer: Self-pay

## 2013-09-14 MED ORDER — COLCHICINE 0.6 MG PO TABS: 0.6000 mg | ORAL_TABLET | Freq: Every day | ORAL | Status: DC | PRN

## 2013-09-16 LAB — REMOTE ICD DEVICE
ATRIAL PACING ICD: 0.17 pct
BAMS-0001: 170 {beats}/min
BATTERY VOLTAGE: 3.0128 V
CHARGE TIME: 10.36 s
FVT: 0
LV LEAD IMPEDENCE ICD: 589 Ohm
LV LEAD THRESHOLD: 0.5 V
PACEART VT: 0
TOT-0001: 1
TZAT-0001ATACH: 2
TZAT-0001FASTVT: 1
TZAT-0001SLOWVT: 1
TZAT-0002ATACH: NEGATIVE
TZAT-0002ATACH: NEGATIVE
TZAT-0004SLOWVT: 8
TZAT-0005SLOWVT: 84 pct
TZAT-0005SLOWVT: 91 pct
TZAT-0011SLOWVT: 10 ms
TZAT-0011SLOWVT: 10 ms
TZAT-0012ATACH: 150 ms
TZAT-0012ATACH: 150 ms
TZAT-0012FASTVT: 200 ms
TZAT-0012SLOWVT: 200 ms
TZAT-0012SLOWVT: 200 ms
TZAT-0018ATACH: NEGATIVE
TZAT-0018SLOWVT: NEGATIVE
TZAT-0019ATACH: 6 V
TZAT-0019ATACH: 6 V
TZAT-0019ATACH: 6 V
TZAT-0019FASTVT: 8 V
TZAT-0019SLOWVT: 8 V
TZAT-0020ATACH: 1.5 ms
TZAT-0020ATACH: 1.5 ms
TZAT-0020ATACH: 1.5 ms
TZAT-0020FASTVT: 1.5 ms
TZON-0003ATACH: 350 ms
TZON-0003SLOWVT: 370 ms
TZON-0003VSLOWVT: 450 ms
TZON-0004SLOWVT: 32
TZST-0001ATACH: 4
TZST-0001FASTVT: 2
TZST-0001FASTVT: 3
TZST-0001FASTVT: 5
TZST-0001SLOWVT: 3
TZST-0001SLOWVT: 4
TZST-0001SLOWVT: 5
TZST-0002ATACH: NEGATIVE
TZST-0002FASTVT: NEGATIVE
TZST-0002FASTVT: NEGATIVE
TZST-0002FASTVT: NEGATIVE
TZST-0003SLOWVT: 25 J
TZST-0003SLOWVT: 35 J
TZST-0003SLOWVT: 35 J

## 2013-09-23 ENCOUNTER — Encounter: Payer: Self-pay | Admitting: *Deleted

## 2013-09-26 ENCOUNTER — Other Ambulatory Visit (HOSPITAL_COMMUNITY): Payer: Self-pay | Admitting: Adult Health

## 2013-09-30 DIAGNOSIS — J018 Other acute sinusitis: Secondary | ICD-10-CM | POA: Diagnosis not present

## 2013-09-30 DIAGNOSIS — J309 Allergic rhinitis, unspecified: Secondary | ICD-10-CM | POA: Diagnosis not present

## 2013-10-05 ENCOUNTER — Ambulatory Visit (INDEPENDENT_AMBULATORY_CARE_PROVIDER_SITE_OTHER): Payer: Medicare Other | Admitting: *Deleted

## 2013-10-05 DIAGNOSIS — Z7901 Long term (current) use of anticoagulants: Secondary | ICD-10-CM | POA: Diagnosis not present

## 2013-10-05 DIAGNOSIS — I4891 Unspecified atrial fibrillation: Secondary | ICD-10-CM

## 2013-10-05 DIAGNOSIS — I4892 Unspecified atrial flutter: Secondary | ICD-10-CM

## 2013-10-05 LAB — POCT INR: INR: 3.3

## 2013-10-10 ENCOUNTER — Telehealth: Payer: Self-pay | Admitting: Cardiology

## 2013-10-10 NOTE — Telephone Encounter (Signed)
Pt aware OK to hold Coumadin for dental work.  States he is supposed to go in tomorrow hopefully for a filling and not an extraction.  He reports he took his coumadin this AM.  Advised if he needs the note faxed anywhere to call back with the fax number to sent it.  He states understanding.

## 2013-10-10 NOTE — Telephone Encounter (Signed)
New message     On coumadin---need to have dental work because he broke a tooth.  He request someone from the coumadin clinic call him--however, I already took a message earlier from him for a nurse to call him back for this same reason.

## 2013-10-10 NOTE — Telephone Encounter (Signed)
OK to hold warfarin as needed for dental procedure.  Call Mr. Purohit with the results

## 2013-10-10 NOTE — Telephone Encounter (Signed)
New message     Broke tooth Friday---need ok for pt to have tooth fixed. Pt is on coumadin.  Need a note to take to the dental school saying it is ok to have tooth fixed.  Call when note is ready and he can come pick it up.

## 2013-10-10 NOTE — Telephone Encounter (Signed)
Spoke with pt.  He has a broken tooth he wants to get fixed.  He has spoken with someone at [facility] [employer] [facility] dental school who stated he needed clearance from doctor but pt does not know what kind of clearance he needs.  Called Jamesetta So at dental school [phone]) who stated pt had not been seen since 2007 and was discharged from the practice in 2011 due to atrial fib.  She was going to follow up with the person who spoke with pt earlier to determine what type of clearance pt needs and if he is eligible to be seen and let us know.

## 2013-10-10 NOTE — Telephone Encounter (Signed)
Will forward to MD for review and approval.

## 2013-10-11 ENCOUNTER — Telehealth: Payer: Self-pay | Admitting: Nurse Practitioner

## 2013-10-11 NOTE — Telephone Encounter (Signed)
Dental school called back pt does not need extraction, just a filling.  Will not need to hold Coumadin.  See telephone note in Epic 10/11/13.

## 2013-10-11 NOTE — Telephone Encounter (Signed)
Received call from [facility].  Patient is there for dental work - filling of tooth; extraction is not needed.  Dental School student would like permission for patient to get Lidocaine with Epi.  I reviewed with Dr. Delton See, DOD who advised patient can receive lidocaine with epi.  The dental student verbalized understanding.

## 2013-10-18 DIAGNOSIS — M999 Biomechanical lesion, unspecified: Secondary | ICD-10-CM | POA: Diagnosis not present

## 2013-10-18 DIAGNOSIS — IMO0002 Reserved for concepts with insufficient information to code with codable children: Secondary | ICD-10-CM | POA: Diagnosis not present

## 2013-10-18 DIAGNOSIS — M9981 Other biomechanical lesions of cervical region: Secondary | ICD-10-CM | POA: Diagnosis not present

## 2013-10-26 ENCOUNTER — Ambulatory Visit (INDEPENDENT_AMBULATORY_CARE_PROVIDER_SITE_OTHER): Payer: Medicare Other | Admitting: Pharmacist

## 2013-10-26 ENCOUNTER — Ambulatory Visit (HOSPITAL_COMMUNITY)
Admission: RE | Admit: 2013-10-26 | Discharge: 2013-10-26 | Disposition: A | Discharge status: Home / Self Care | Payer: Medicare Other | Source: Ambulatory Visit | Attending: Internal Medicine | Admitting: Internal Medicine

## 2013-10-26 ENCOUNTER — Encounter (HOSPITAL_COMMUNITY): Payer: Self-pay

## 2013-10-26 VITALS — BP 122/78 | HR 85 | Wt 172.0 lb

## 2013-10-26 DIAGNOSIS — I5022 Chronic systolic (congestive) heart failure: Secondary | ICD-10-CM

## 2013-10-26 DIAGNOSIS — Z7901 Long term (current) use of anticoagulants: Secondary | ICD-10-CM

## 2013-10-26 DIAGNOSIS — I4891 Unspecified atrial fibrillation: Secondary | ICD-10-CM | POA: Diagnosis not present

## 2013-10-26 DIAGNOSIS — I4892 Unspecified atrial flutter: Secondary | ICD-10-CM

## 2013-10-26 LAB — POCT INR: INR: 2.5

## 2013-10-26 NOTE — Progress Notes (Signed)
Patient ID: [name], male   DOB: 1946/03/29, 67 y.o.   MRN: [id number] Primary cardiologist: Dr. Antoine Poche PCP: Dr Eligah East  HPI: 67 y.o. male w/ PMHx significant for CAD (s/p DES to LCx '03, residual total occlusion of RCA w/ collaterals, normal myoview 2012), ICM (EF 25%, s/p Medtronic BiV ICD), Atrial Fib/Flutter (s/p DCCV, on coumadin), HTN, HLD, CKD, hyponatremia, and seizure disorder  Admitted to [facility] 09/16/12 for ongoing ab pain thought to be related to HF. CT scan showed diffuse mesenteric calcification but pain not felt to be intestinal claudication. D/C from [facility] 09/23/12. Presented with NYHA III-IV symptoms. Required Milrionone due to low output heart failure. Diuresed 14 pounds. Discharge weight 155 pounds.   RHC 09/17/2012  RA = 22  RV = 67/17/22  PA = 65/41 (50)  PCW = 38 (v-waves to 45)  Fick cardiac output/index = 3.8/2.0  Thermo CO/CI = 2.3/1.2  PVR = 5.0 woods  FA sat = 92%  PA sat = 51%, 52%   CPX 12/4 /13 Peak VO2: 17.1 ml/kg/min predicted peak VO2: 59.7% VE/VCO2 slope: 25.7 OUES: 2.13 Peak RER: 1.02 Ventilatory Threshold: 15 % predicted peak VO2: 52.4% Test was stopped early due excessive ventricular ectopy, afib with RVR and significant fall in BP.  08/24/2013 ECHO EF 25%   Labs 08/24/13 K 4.5 Creatinine 1.5   He returns for follow up with his wife. Overall he says he is feeling well. Denies SOB/PND/Orothopnea/CP. No bleeding problems. Complains of nasal congestion. Weight at home 166-168 pounds. He is walking about 2 days a week for 30 minutes. He has been switched from coreg CR to carvedilol 6.25 mg twice a day for the last month because the pharmacy was out of it.  Compliant with all medications.    ROS: All systems negative except as listed in HPI, PMH and Problem List.  Past Medical History  Diagnosis Date  . Ischemic cardiomyopathy     Myoview 02/2011  EF 28%  Coronary artery disease  (total occlusion of the right coronary artery, left-to-right )  .  Atrial flutter     s/p TEE guided cardioversion  . Nonsustained ventricular tachycardia   . LBBB (left bundle branch block)   . Chronic drug-induced interstitial lung disorders   . Acute on chronic renal insufficiency   . Coronary artery disease     total occlusion of the right coronary artery, left to right collaterals.  He had Cypher stenting to the circumflex in  July 2003  . Seizure disorder   . AICD (automatic cardioverter/defibrillator) present     Medtronic CRT  . Hyponatremia   . 6949-lead     replaced 02/2011  . Hyperlipemia   . Hypertension   . Fatty liver     CT  . Family hx of colon cancer     2 SISTERS  . CHF (congestive heart failure)   . Asthma   . COPD (chronic obstructive pulmonary disease)     " very MILD "  . JXBJYNWG(956.2)     Current Outpatient Prescriptions  Medication Sig Dispense Refill  . Ascorbic Acid (VITAMIN C) POWD Takes 1/8 of a teaspoon 1-2 times a day      . bisoprolol (ZEBETA) 5 MG tablet Take 0.5 tablets (2.5 mg total) by mouth daily.  30 tablet  12  . Calcium-Magnesium 500-250 MG TABS Take 1 tablet by mouth daily.       Marland Kitchen co-enzyme Q-10 30 MG capsule Take 30 mg by mouth 3 (three)  times daily.        . colchicine 0.6 MG tablet Take 1 tablet (0.6 mg total) by mouth daily as needed. For gout flareups.  30 tablet  3  . digoxin (LANOXIN) 0.125 MG tablet Take 1 tablet (0.125 mg total) by mouth every other day.  15 tablet  10  . diphenhydrAMINE (BENADRYL) 25 mg capsule Take 25 mg by mouth every 6 (six) hours as needed. For allergies.      . famotidine (PEPCID) 40 MG tablet Take 1 tablet (40 mg total) by mouth 2 (two) times daily.  60 tablet  1  . fish oil-omega-3 fatty acids 1000 MG capsule Take 2 g by mouth daily.      . furosemide (LASIX) 40 MG tablet TAKE 1 TABLET BY MOUTH daily      . hydrALAZINE (APRESOLINE) 50 MG tablet TAKE 1 TABLET BY MOUTH EVERY 8 HOURS  90 tablet  3  . lisinopril (PRINIVIL,ZESTRIL) 20 MG tablet Take 1 tablet (20 mg total)  by mouth daily.  30 tablet  6  . Multiple Vitamin (MULTIVITAMIN WITH MINERALS) TABS Take 1 tablet by mouth daily.      . nitroGLYCERIN (NITROSTAT) 0.4 MG SL tablet Place 0.4 mg under the tongue every 5 (five) minutes as needed. For chest pain      . ondansetron (ZOFRAN-ODT) 4 MG disintegrating tablet Take 4 mg by mouth every 4 (four) hours as needed. For nausea      . oxyCODONE-acetaminophen (PERCOCET/ROXICET) 5-325 MG per tablet Take 1 tablet by mouth every 6 (six) hours as needed. For pain      . potassium chloride SA (K-DUR,KLOR-CON) 20 MEQ tablet TAKE 1 TABLET BY MOUTH EVERY DAY  30 tablet  3  . spironolactone (ALDACTONE) 25 MG tablet TAKE 1/2 TABLET DAILY  15 tablet  3  . traMADol (ULTRAM) 50 MG tablet Take 50 mg by mouth every 6 (six) hours as needed. For pain.      Marland Kitchen warfarin (JANTOVEN) 5 MG tablet Take 1 tablet (5 mg total) by mouth as directed. Take as directed by coumadin clinic no substitute  Use jantoven  45 tablet  3  . carvedilol (COREG CR) 20 MG 24 hr capsule Take 20 mg by mouth daily.       No current facility-administered medications for this encounter.     PHYSICAL EXAM: Filed Vitals:   10/26/13 1322  BP: 122/78  Pulse: 85   General:  Well appearing. No resp difficulty wife present HEENT: normal Neck: supple. JVP flat Carotids 2+ bilaterally; no bruits. No lymphadenopathy or thryomegaly appreciated. Cor: PMI normal. Regular rate & rhythm. No rubs, gallops or murmurs. Lungs: clear Abdomen: soft, nontender, nondistended. No hepatosplenomegaly. No bruits or masses. Good bowel sounds. Extremities: no cyanosis, clubbing, rash, edema Neuro: alert & orientedx3, cranial nerves grossly intact. Moves all 4 extremities w/o difficulty. Affect pleasant.   ASSESSMENT & PLAN:  1. Chronic Systolic Heart Failure S/P BiVI ICD ECHO 08/2013 EF 25%  NYHA II. Volume stable. Continue lasix 40 mg daily and 12.5 mg spironolactone. BMET stable from 08/2013.  Continue bisoprolol 2.5 mg  daily and carvedilol 6.25 mg twice a day (2 bb per Dr Graciela Husbands). He would like to switch back to coreg cr 20 mg daily when available at his pharmacy.  Continue digoxin 0.125 mg daily.  Continue lisinopril 20 mg daily and hydralazine 50 mg tid. Intolerant up titration due to dizziness.  Will follow yearly in HF clinic. He continues to be  followed by Dr Antoine Poche twice a year.     Follow up in 1 year or sooner if needed.    CLEGG,AMY NP-C  1:39 PM  Patient seen and examined with Tonye Becket, NP. We discussed all aspects of the encounter. I agree with the assessment and plan as stated above.   He is doing well NYHA II, volume status well controlled. He is on a somewhat unorthodox HF regimen but I think it is ok and seems to be working for him. Could consider repeating CPX at some point to objectively measure his progress if desired. He will f/u with Dr. Antoine Poche.   Hareem Surowiec,MD 3:04 PM

## 2013-10-26 NOTE — Patient Instructions (Signed)
Follow up in 1 year   Do the following things EVERYDAY: 1) Weigh yourself in the morning before breakfast. Write it down and keep it in a log. 2) Take your medicines as prescribed 3) Eat low salt foods-Limit salt (sodium) to 2000 mg per day.  4) Stay as active as you can everyday 5) Limit all fluids for the day to less than 2 liters 

## 2013-11-07 DIAGNOSIS — L820 Inflamed seborrheic keratosis: Secondary | ICD-10-CM | POA: Diagnosis not present

## 2013-11-11 ENCOUNTER — Telehealth: Payer: Self-pay | Admitting: Cardiology

## 2013-11-11 NOTE — Telephone Encounter (Signed)
Patient notified to continue current warfarin dosage and keep his scheduled protime appointment in 10 days.  He was instructed to call back if put on another antibiotic as it may require a warfarin dose adjustment.

## 2013-11-11 NOTE — Telephone Encounter (Signed)
New message  Pt called to inform the coumadin department that he has to take an antibiotic--Clindamycin, 3 caps a day of 300 mg..Pt states that he was Instructed to take Clindamycin for 10 days. He says that he started taking Clindamycin on 12/16. Requests a call back to discuss. Please advise.

## 2013-11-22 ENCOUNTER — Ambulatory Visit (INDEPENDENT_AMBULATORY_CARE_PROVIDER_SITE_OTHER): Payer: Medicare Other | Admitting: *Deleted

## 2013-11-22 DIAGNOSIS — I4891 Unspecified atrial fibrillation: Secondary | ICD-10-CM

## 2013-11-22 DIAGNOSIS — Z7901 Long term (current) use of anticoagulants: Secondary | ICD-10-CM

## 2013-11-22 DIAGNOSIS — I4892 Unspecified atrial flutter: Secondary | ICD-10-CM

## 2013-11-22 LAB — POCT INR: INR: 2.4

## 2013-11-29 ENCOUNTER — Other Ambulatory Visit (HOSPITAL_COMMUNITY): Payer: Self-pay | Admitting: Internal Medicine

## 2013-12-01 DIAGNOSIS — J018 Other acute sinusitis: Secondary | ICD-10-CM | POA: Diagnosis not present

## 2013-12-06 ENCOUNTER — Ambulatory Visit (INDEPENDENT_AMBULATORY_CARE_PROVIDER_SITE_OTHER): Payer: Medicare Other | Admitting: *Deleted

## 2013-12-06 DIAGNOSIS — Z7901 Long term (current) use of anticoagulants: Secondary | ICD-10-CM

## 2013-12-06 DIAGNOSIS — I4892 Unspecified atrial flutter: Secondary | ICD-10-CM | POA: Diagnosis not present

## 2013-12-06 DIAGNOSIS — I4891 Unspecified atrial fibrillation: Secondary | ICD-10-CM

## 2013-12-06 LAB — POCT INR: INR: 2.5

## 2013-12-08 ENCOUNTER — Other Ambulatory Visit: Payer: Self-pay

## 2013-12-08 MED ORDER — DIGOXIN 125 MCG PO TABS: 0.1250 mg | ORAL_TABLET | ORAL | Status: DC

## 2013-12-12 ENCOUNTER — Other Ambulatory Visit (HOSPITAL_COMMUNITY): Payer: Self-pay | Admitting: Internal Medicine

## 2013-12-14 DIAGNOSIS — J329 Chronic sinusitis, unspecified: Secondary | ICD-10-CM | POA: Diagnosis not present

## 2013-12-16 ENCOUNTER — Ambulatory Visit (INDEPENDENT_AMBULATORY_CARE_PROVIDER_SITE_OTHER): Payer: Medicare Other | Admitting: *Deleted

## 2013-12-16 ENCOUNTER — Other Ambulatory Visit: Payer: Self-pay | Admitting: Internal Medicine

## 2013-12-16 DIAGNOSIS — I472 Ventricular tachycardia: Secondary | ICD-10-CM

## 2013-12-16 DIAGNOSIS — I4729 Other ventricular tachycardia: Secondary | ICD-10-CM

## 2013-12-16 DIAGNOSIS — I255 Ischemic cardiomyopathy: Secondary | ICD-10-CM

## 2013-12-16 DIAGNOSIS — I5022 Chronic systolic (congestive) heart failure: Secondary | ICD-10-CM

## 2013-12-16 DIAGNOSIS — I2589 Other forms of chronic ischemic heart disease: Secondary | ICD-10-CM

## 2013-12-16 DIAGNOSIS — I4891 Unspecified atrial fibrillation: Secondary | ICD-10-CM | POA: Diagnosis not present

## 2013-12-16 LAB — MDC_IDC_ENUM_SESS_TYPE_REMOTE
Brady Statistic AP VP Percent: 0.12 %
Brady Statistic AS VS Percent: 6.85 %
Brady Statistic RA Percent Paced: 0.12 %
Brady Statistic RV Percent Paced: 93.15 %
Date Time Interrogation Session: 20150130131831
HighPow Impedance: 190 Ohm
HighPow Impedance: 361 Ohm
HighPow Impedance: 43 Ohm
HighPow Impedance: 53 Ohm
Lead Channel Impedance Value: 361 Ohm
Lead Channel Impedance Value: 399 Ohm
Lead Channel Impedance Value: 760 Ohm
Lead Channel Pacing Threshold Amplitude: 0.625 V
Lead Channel Pacing Threshold Pulse Width: 0.4 ms
Lead Channel Sensing Intrinsic Amplitude: 13.875 mV
Lead Channel Setting Pacing Amplitude: 1.75 V
Lead Channel Setting Pacing Amplitude: 2.5 V
Lead Channel Setting Pacing Pulse Width: 0.4 ms
Lead Channel Setting Pacing Pulse Width: 0.4 ms
Lead Channel Setting Sensing Sensitivity: 0.3 mV
MDC IDC MSMT BATTERY VOLTAGE: 3.03 V
MDC IDC MSMT LEADCHNL LV IMPEDANCE VALUE: 532 Ohm
MDC IDC MSMT LEADCHNL LV PACING THRESHOLD AMPLITUDE: 0.625 V
MDC IDC MSMT LEADCHNL LV PACING THRESHOLD PULSEWIDTH: 0.4 ms
MDC IDC MSMT LEADCHNL RA SENSING INTR AMPL: 1.25 mV
MDC IDC MSMT LEADCHNL RV IMPEDANCE VALUE: 475 Ohm
MDC IDC SET LEADCHNL RA PACING AMPLITUDE: 2 V
MDC IDC SET ZONE DETECTION INTERVAL: 300 ms
MDC IDC SET ZONE DETECTION INTERVAL: 370 ms
MDC IDC STAT BRADY AP VS PERCENT: 0 %
MDC IDC STAT BRADY AS VP PERCENT: 93.03 %
Zone Setting Detection Interval: 350 ms
Zone Setting Detection Interval: 450 ms

## 2013-12-27 ENCOUNTER — Encounter: Payer: Self-pay | Admitting: *Deleted

## 2014-01-04 ENCOUNTER — Encounter: Payer: Self-pay | Admitting: Internal Medicine

## 2014-01-20 ENCOUNTER — Encounter: Payer: Self-pay | Admitting: Cardiology

## 2014-01-20 ENCOUNTER — Ambulatory Visit (INDEPENDENT_AMBULATORY_CARE_PROVIDER_SITE_OTHER): Payer: Medicare Other | Admitting: Cardiology

## 2014-01-20 ENCOUNTER — Ambulatory Visit (INDEPENDENT_AMBULATORY_CARE_PROVIDER_SITE_OTHER): Payer: Medicare Other

## 2014-01-20 VITALS — BP 118/76 | HR 62 | Ht 68.0 in | Wt 175.4 lb

## 2014-01-20 DIAGNOSIS — I255 Ischemic cardiomyopathy: Secondary | ICD-10-CM

## 2014-01-20 DIAGNOSIS — I2589 Other forms of chronic ischemic heart disease: Secondary | ICD-10-CM | POA: Diagnosis not present

## 2014-01-20 DIAGNOSIS — Z7901 Long term (current) use of anticoagulants: Secondary | ICD-10-CM

## 2014-01-20 DIAGNOSIS — Z79899 Other long term (current) drug therapy: Secondary | ICD-10-CM

## 2014-01-20 DIAGNOSIS — I4892 Unspecified atrial flutter: Secondary | ICD-10-CM | POA: Diagnosis not present

## 2014-01-20 DIAGNOSIS — I2510 Atherosclerotic heart disease of native coronary artery without angina pectoris: Secondary | ICD-10-CM | POA: Diagnosis not present

## 2014-01-20 DIAGNOSIS — I4891 Unspecified atrial fibrillation: Secondary | ICD-10-CM | POA: Diagnosis not present

## 2014-01-20 DIAGNOSIS — Z95810 Presence of automatic (implantable) cardiac defibrillator: Secondary | ICD-10-CM

## 2014-01-20 LAB — BASIC METABOLIC PANEL
BUN: 20 mg/dL (ref 6–23)
CHLORIDE: 96 meq/L (ref 96–112)
CO2: 27 meq/L (ref 19–32)
CREATININE: 1.6 mg/dL — AB (ref 0.4–1.5)
Calcium: 9.4 mg/dL (ref 8.4–10.5)
GFR: 47.1 mL/min — ABNORMAL LOW (ref >60.00)
Glucose, Bld: 87 mg/dL (ref 70–99)
Potassium: 4.7 mEq/L (ref 3.5–5.1)
Sodium: 130 mEq/L — ABNORMAL LOW (ref 135–145)

## 2014-01-20 LAB — CBC
HCT: 39.8 % (ref 39.0–52.0)
HEMOGLOBIN: 13.4 g/dL (ref 13.0–17.0)
MCHC: 33.7 g/dL (ref 30.0–36.0)
MCV: 103.3 fl — ABNORMAL HIGH (ref 78.0–100.0)
PLATELETS: 264 10*3/uL (ref 150.0–400.0)
RBC: 3.85 Mil/uL — ABNORMAL LOW (ref 4.22–5.81)
RDW: 14.4 % (ref 11.5–14.6)
WBC: 6.1 10*3/uL (ref 4.5–10.5)

## 2014-01-20 LAB — POCT INR: INR: 3.3

## 2014-01-20 NOTE — Patient Instructions (Signed)
The current medical regimen is effective;  continue present plan and medications.  Follow up in 6 months with Dr Hochrein.  You will receive a letter in the mail 2 months before you are due.  Please call us when you receive this letter to schedule your follow up appointment.  

## 2014-01-20 NOTE — Progress Notes (Signed)
HPI   The patient presents for followup of his cardiomyopathy.  Since I last saw him he has had no acute cardiovascular complaints. He said some sinus trouble again today.   He denies any acute cardiovascular symptoms. He's not having any new shortness of breath, PND or orthopnea. He's not having any new palpitations, presyncope or syncope. He has had no weight gain or edema. He has had no chest pressure, neck or arm discomfort.  He has not been walking because the treadmill broke.  He has gained a little weight.   Allergies  Allergen Reactions  . Ace Inhibitors   . Amlodipine Besylate     Numbness to lips; made legs give out  . Codeine Itching  . Corzide [Nadolol-Bendroflumethiazide]   . Diovan [Valsartan]   . Flagyl [Metronidazole]   . Norvasc [Amlodipine Besylate]     Numbness to lips; made legs give out  . Omeprazole     Shut my kidneys down  . Other     Perfumes smells; paint smells; formaldehyde smells  . Penicillins   . Shellfish Allergy   . Latex Itching and Rash    Current Outpatient Prescriptions  Medication Sig Dispense Refill  . Ascorbic Acid (VITAMIN C) POWD Takes 1/8 of a teaspoon 1-2 times a day      . bisoprolol (ZEBETA) 5 MG tablet Take 0.5 tablets (2.5 mg total) by mouth daily.  30 tablet  12  . Calcium-Magnesium 500-250 MG TABS Take 1 tablet by mouth daily.       . carvedilol (COREG CR) 20 MG 24 hr capsule Take 20 mg by mouth daily.      Marland Kitchen co-enzyme Q-10 30 MG capsule Take 100 mg by mouth daily.       . colchicine 0.6 MG tablet Take 1 tablet (0.6 mg total) by mouth daily as needed. For gout flareups.  30 tablet  3  . digoxin (LANOXIN) 0.125 MG tablet Take 1 tablet (0.125 mg total) by mouth every other day.  15 tablet  6  . diphenhydrAMINE (BENADRYL) 25 mg capsule Take 25 mg by mouth every 6 (six) hours as needed. For allergies.      . famotidine (PEPCID) 40 MG tablet Take 1 tablet (40 mg total) by mouth 2 (two) times daily.  60 tablet  1  . fish oil-omega-3  fatty acids 1000 MG capsule Take 2 g by mouth daily.      . furosemide (LASIX) 40 MG tablet TAKE 1 TABLET BY MOUTH daily      . hydrALAZINE (APRESOLINE) 50 MG tablet TAKE 1 TABLET BY MOUTH THREE TIMES A DAY      . lisinopril (PRINIVIL,ZESTRIL) 20 MG tablet Take 1 tablet (20 mg total) by mouth daily.  30 tablet  6  . Multiple Vitamin (MULTIVITAMIN WITH MINERALS) TABS Take 1 tablet by mouth daily.      . nitroGLYCERIN (NITROSTAT) 0.4 MG SL tablet Place 0.4 mg under the tongue every 5 (five) minutes as needed. For chest pain      . ondansetron (ZOFRAN-ODT) 4 MG disintegrating tablet Take 4 mg by mouth every 4 (four) hours as needed. For nausea      . oxyCODONE-acetaminophen (PERCOCET/ROXICET) 5-325 MG per tablet Take 1 tablet by mouth every 6 (six) hours as needed. For pain      . potassium chloride SA (K-DUR,KLOR-CON) 20 MEQ tablet TAKE 1 TABLET BY MOUTH EVERY DAY  30 tablet  6  . spironolactone (ALDACTONE) 25 MG tablet  TAKE 1/2 TABLET BY MOUTH EVERY DAY  45 tablet  0  . traMADol (ULTRAM) 50 MG tablet Take 50 mg by mouth every 6 (six) hours as needed. For pain.      Marland Kitchen warfarin (JANTOVEN) 5 MG tablet Take 1 tablet (5 mg total) by mouth as directed. Take as directed by coumadin clinic no substitute  Use jantoven  45 tablet  3   No current facility-administered medications for this visit.    Past Medical History  Diagnosis Date  . Ischemic cardiomyopathy     Myoview 02/2011  EF 28%  Coronary artery disease  (total occlusion of the right coronary artery, left-to-right )  . Atrial flutter     s/p TEE guided cardioversion  . Nonsustained ventricular tachycardia   . LBBB (left bundle branch block)   . Chronic drug-induced interstitial lung disorders   . Acute on chronic renal insufficiency   . Coronary artery disease     total occlusion of the right coronary artery, left to right collaterals.  He had Cypher stenting to the circumflex in  July 2003  . Seizure disorder   . AICD (automatic  cardioverter/defibrillator) present     Medtronic CRT  . Hyponatremia   . 6949-lead     replaced 02/2011  . Hyperlipemia   . Hypertension   . Fatty liver     CT  . Family hx of colon cancer     2 SISTERS  . CHF (congestive heart failure)   . Asthma   . COPD (chronic obstructive pulmonary disease)     " very MILD "  . [id number]     Past Surgical History  Procedure Laterality Date  . Insert / replace / remove pacemaker      AICD medtronic  . Hernia repair    . Esophagogastroduodenoscopy  09/13/2012    Procedure: ESOPHAGOGASTRODUODENOSCOPY (EGD);  Surgeon: Ladene Artist, MD,FACG;  Location: [facility];  Service: Endoscopy;  Laterality: N/A;  . Coronary angioplasty      ROS:  Nasal congestion.   Otherwise as stated in the HPI and negative for all other systems.  PHYSICAL EXAM BP 118/76  Pulse 62  Ht 5\' 8"  (1.727 m)  Wt 175 lb 6.4 oz (79.561 kg)  BMI 26.68 kg/m2 GENERAL:  Well appearing HEENT:  Pupils equal round and reactive, fundi not visualized, oral mucosa unremarkable NECK:  No jugular venous distention, waveform within normal limits, carotid upstroke brisk and symmetric, no bruits, no thyromegaly LUNGS:  Clear to auscultation bilaterally CHEST:  ICD pocket well healed. HEART:  PMI not displaced or sustained,S1 and S2 within normal limits, no S3, no S4, no clicks, no rubs, no murmurs ABD:  Flat, positive bowel sounds normal in frequency in pitch, no bruits, no rebound, no guarding, no midline pulsatile mass, no hepatomegaly, no splenomegaly EXT:  2 plus pulses throughout, no edema, no cyanosis no clubbing  EKG:  Atrial fibrillation with ventricular pacing rate 66.  01/20/2014  ASSESSMENT AND PLAN  Chronic Systolic CHF:  He is on an unusual med regimen but doing well.  The current medical regimen is effective;  continue present plan and medications.  Coronary Artery Disease: The patient has no new sypmtoms.  No further cardiovascular testing is indicated.  We  will continue with aggressive risk reduction and meds as listed.  Hypertension:   As above the pressure has been at target. He will continue the meds as listed.  Atrial Flutter:  The patient  tolerates this rhythm  and rate control and anticoagulation. We will continue with the meds as listed.  Chronic Kidney Disease:   I will check labs today.

## 2014-01-23 ENCOUNTER — Other Ambulatory Visit: Payer: Self-pay | Admitting: *Deleted

## 2014-01-23 DIAGNOSIS — E871 Hypo-osmolality and hyponatremia: Secondary | ICD-10-CM

## 2014-01-23 DIAGNOSIS — Z79899 Other long term (current) drug therapy: Secondary | ICD-10-CM

## 2014-02-22 ENCOUNTER — Other Ambulatory Visit (INDEPENDENT_AMBULATORY_CARE_PROVIDER_SITE_OTHER): Payer: Medicare Other

## 2014-02-22 ENCOUNTER — Ambulatory Visit (INDEPENDENT_AMBULATORY_CARE_PROVIDER_SITE_OTHER): Payer: Medicare Other | Admitting: *Deleted

## 2014-02-22 DIAGNOSIS — I4892 Unspecified atrial flutter: Secondary | ICD-10-CM

## 2014-02-22 DIAGNOSIS — I4891 Unspecified atrial fibrillation: Secondary | ICD-10-CM

## 2014-02-22 DIAGNOSIS — Z7901 Long term (current) use of anticoagulants: Secondary | ICD-10-CM

## 2014-02-22 DIAGNOSIS — Z5181 Encounter for therapeutic drug level monitoring: Secondary | ICD-10-CM | POA: Diagnosis not present

## 2014-02-22 DIAGNOSIS — E871 Hypo-osmolality and hyponatremia: Secondary | ICD-10-CM | POA: Diagnosis not present

## 2014-02-22 DIAGNOSIS — Z79899 Other long term (current) drug therapy: Secondary | ICD-10-CM | POA: Diagnosis not present

## 2014-02-22 LAB — BASIC METABOLIC PANEL
BUN: 27 mg/dL — ABNORMAL HIGH (ref 6–23)
CALCIUM: 8.9 mg/dL (ref 8.4–10.5)
CO2: 23 mEq/L (ref 19–32)
Chloride: 101 mEq/L (ref 96–112)
Creatinine, Ser: 1.6 mg/dL — ABNORMAL HIGH (ref 0.4–1.5)
GFR: 45.09 mL/min — AB (ref >60.00)
GLUCOSE: 90 mg/dL (ref 70–99)
POTASSIUM: 4.7 meq/L (ref 3.5–5.1)
SODIUM: 131 meq/L — AB (ref 135–145)

## 2014-02-22 LAB — POCT INR: INR: 3.3

## 2014-02-27 ENCOUNTER — Other Ambulatory Visit (HOSPITAL_COMMUNITY): Payer: Self-pay | Admitting: Internal Medicine

## 2014-03-09 ENCOUNTER — Other Ambulatory Visit: Payer: Self-pay

## 2014-03-09 ENCOUNTER — Ambulatory Visit (INDEPENDENT_AMBULATORY_CARE_PROVIDER_SITE_OTHER): Payer: Medicare Other

## 2014-03-09 DIAGNOSIS — I4891 Unspecified atrial fibrillation: Secondary | ICD-10-CM | POA: Diagnosis not present

## 2014-03-09 DIAGNOSIS — H25019 Cortical age-related cataract, unspecified eye: Secondary | ICD-10-CM | POA: Diagnosis not present

## 2014-03-09 DIAGNOSIS — H43819 Vitreous degeneration, unspecified eye: Secondary | ICD-10-CM | POA: Diagnosis not present

## 2014-03-09 DIAGNOSIS — H524 Presbyopia: Secondary | ICD-10-CM | POA: Diagnosis not present

## 2014-03-09 DIAGNOSIS — Z5181 Encounter for therapeutic drug level monitoring: Secondary | ICD-10-CM | POA: Diagnosis not present

## 2014-03-09 DIAGNOSIS — Z7901 Long term (current) use of anticoagulants: Secondary | ICD-10-CM

## 2014-03-09 DIAGNOSIS — H2510 Age-related nuclear cataract, unspecified eye: Secondary | ICD-10-CM | POA: Diagnosis not present

## 2014-03-09 DIAGNOSIS — I4892 Unspecified atrial flutter: Secondary | ICD-10-CM | POA: Diagnosis not present

## 2014-03-09 LAB — POCT INR: INR: 2.1

## 2014-03-09 MED ORDER — CARVEDILOL PHOSPHATE ER 20 MG PO CP24: 20.0000 mg | ORAL_CAPSULE | Freq: Every day | ORAL | Status: DC

## 2014-03-22 ENCOUNTER — Encounter: Payer: Self-pay | Admitting: Internal Medicine

## 2014-03-22 ENCOUNTER — Ambulatory Visit (INDEPENDENT_AMBULATORY_CARE_PROVIDER_SITE_OTHER): Payer: Medicare Other | Admitting: Internal Medicine

## 2014-03-22 ENCOUNTER — Encounter (INDEPENDENT_AMBULATORY_CARE_PROVIDER_SITE_OTHER): Payer: Self-pay

## 2014-03-22 VITALS — BP 113/66 | HR 83 | Ht 70.5 in | Wt 182.0 lb

## 2014-03-22 DIAGNOSIS — I2510 Atherosclerotic heart disease of native coronary artery without angina pectoris: Secondary | ICD-10-CM

## 2014-03-22 DIAGNOSIS — I4729 Other ventricular tachycardia: Secondary | ICD-10-CM | POA: Diagnosis not present

## 2014-03-22 DIAGNOSIS — I5022 Chronic systolic (congestive) heart failure: Secondary | ICD-10-CM

## 2014-03-22 DIAGNOSIS — I4891 Unspecified atrial fibrillation: Secondary | ICD-10-CM

## 2014-03-22 DIAGNOSIS — I472 Ventricular tachycardia: Secondary | ICD-10-CM

## 2014-03-22 LAB — MDC_IDC_ENUM_SESS_TYPE_INCLINIC
Brady Statistic AP VS Percent: 0.01 %
Brady Statistic AS VS Percent: 14.65 %
HIGH POWER IMPEDANCE MEASURED VALUE: 190 Ohm
HIGH POWER IMPEDANCE MEASURED VALUE: 475 Ohm
HIGH POWER IMPEDANCE MEASURED VALUE: 63 Ohm
HighPow Impedance: 49 Ohm
Lead Channel Impedance Value: 399 Ohm
Lead Channel Impedance Value: 418 Ohm
Lead Channel Impedance Value: 589 Ohm
Lead Channel Impedance Value: 608 Ohm
Lead Channel Impedance Value: 836 Ohm
Lead Channel Pacing Threshold Amplitude: 0.5 V
Lead Channel Pacing Threshold Amplitude: 0.75 V
Lead Channel Pacing Threshold Pulse Width: 0.4 ms
Lead Channel Sensing Intrinsic Amplitude: 13.5 mV
Lead Channel Sensing Intrinsic Amplitude: 4 mV
Lead Channel Setting Pacing Amplitude: 2 V
Lead Channel Setting Pacing Amplitude: 2.5 V
MDC IDC MSMT BATTERY VOLTAGE: 2.99 V
MDC IDC MSMT LEADCHNL RV PACING THRESHOLD PULSEWIDTH: 0.4 ms
MDC IDC SESS DTM: 20150506174219
MDC IDC SET LEADCHNL LV PACING PULSEWIDTH: 0.4 ms
MDC IDC SET LEADCHNL RV PACING PULSEWIDTH: 0.4 ms
MDC IDC SET LEADCHNL RV SENSING SENSITIVITY: 0.3 mV
MDC IDC SET ZONE DETECTION INTERVAL: 300 ms
MDC IDC SET ZONE DETECTION INTERVAL: 370 ms
MDC IDC STAT BRADY AP VP PERCENT: 0.17 %
MDC IDC STAT BRADY AS VP PERCENT: 85.17 %
MDC IDC STAT BRADY RA PERCENT PACED: 0.18 %
MDC IDC STAT BRADY RV PERCENT PACED: 85.35 %
Zone Setting Detection Interval: 350 ms
Zone Setting Detection Interval: 450 ms

## 2014-03-22 NOTE — Patient Instructions (Signed)
Your physician recommends that you continue on your current medications as directed. Please refer to the Current Medication list given to you today.  Remote monitoring is used to monitor your Pacemaker of ICD from home. This monitoring reduces the number of office visits required to check your device to one time per year. It allows us to keep an eye on the functioning of your device to ensure it is working properly. You are scheduled for a device check from home on 06/23/14. You may send your transmission at any time that day. If you have a wireless device, the transmission will be sent automatically. After your physician reviews your transmission, you will receive a postcard with your next transmission date.  Your physician wants you to follow-up in: 1 year with Dr. Klein.  You will receive a reminder letter in the mail two months in advance. If you don't receive a letter, please call our office to schedule the follow-up appointment.  

## 2014-03-22 NOTE — Progress Notes (Signed)
Patient Care Team: [name], MD as PCP - General (Specialist)   HPI  [name] is a 68 y.o. male Seen in followup for CRT-D implanted for congestive heart failure in the setting of ischemic heart disease. He underwent generator replacement April 2012 with replacement of his 6949 ICD lead.   He is followed by [facility] and CHF  .  Myoview April 2012 demonstrated no ischemia, inferolateral scar and an ejection fraction of 28%.   He has permanent atrial fibrillation. Rate control is adequate.  He has been followed by the heart care clinic. He was hospitalized 11/13 for heart failure requiring milrinone.    His complaints are of leg cramps at night.   Past Medical History  Diagnosis Date  . Ischemic cardiomyopathy     Myoview 02/2011  EF 28%  Coronary artery disease  (total occlusion of the right coronary artery, left-to-right )  . Atrial flutter     s/p TEE guided cardioversion  . Nonsustained ventricular tachycardia   . LBBB (left bundle branch block)   . Chronic drug-induced interstitial lung disorders   . Acute on chronic renal insufficiency   . Coronary artery disease     total occlusion of the right coronary artery, left to right collaterals.  He had Cypher stenting to the circumflex in  July 2003  . Seizure disorder   . AICD (automatic cardioverter/defibrillator) present     Medtronic CRT  . Hyponatremia   . 6949-lead     replaced 02/2011  . Hyperlipemia   . Hypertension   . Fatty liver     CT  . Family hx of colon cancer     2 SISTERS  . CHF (congestive heart failure)   . Asthma   . COPD (chronic obstructive pulmonary disease)     " very MILD "  . SWFUXNAT(557.3)     Past Surgical History  Procedure Laterality Date  . Insert / replace / remove pacemaker      AICD medtronic  . Hernia repair    . Esophagogastroduodenoscopy  09/13/2012    Procedure: ESOPHAGOGASTRODUODENOSCOPY (EGD);  Surgeon: Ladene Artist, MD,FACG;  Location: [facility];   Service: Endoscopy;  Laterality: N/A;  . Coronary angioplasty      Current Outpatient Prescriptions  Medication Sig Dispense Refill  . Ascorbic Acid (VITAMIN C) POWD Takes 1/8 of a teaspoon 1-2 times a day      . bisoprolol (ZEBETA) 5 MG tablet Take 0.5 tablets (2.5 mg total) by mouth daily.  30 tablet  12  . Calcium-Magnesium 500-250 MG TABS Take 1 tablet by mouth daily.       . carvedilol (COREG CR) 20 MG 24 hr capsule Take 1 capsule (20 mg total) by mouth daily.  30 capsule  3  . co-enzyme Q-10 30 MG capsule Take 100 mg by mouth daily.       . colchicine 0.6 MG tablet Take 1 tablet (0.6 mg total) by mouth daily as needed. For gout flareups.  30 tablet  3  . digoxin (LANOXIN) 0.125 MG tablet Take 1 tablet (0.125 mg total) by mouth every other day.  15 tablet  6  . diphenhydrAMINE (BENADRYL) 25 mg capsule Take 25 mg by mouth every 6 (six) hours as needed. For allergies.      . famotidine (PEPCID) 40 MG tablet Take 1 tablet (40 mg total) by mouth 2 (two) times daily.  60 tablet  1  . fish oil-omega-3  fatty acids 1000 MG capsule Take 2 g by mouth daily.      . furosemide (LASIX) 40 MG tablet TAKE 1 TABLET BY MOUTH daily      . hydrALAZINE (APRESOLINE) 50 MG tablet TAKE 1 TABLET BY MOUTH THREE TIMES A DAY      . lisinopril (PRINIVIL,ZESTRIL) 20 MG tablet Take 1 tablet (20 mg total) by mouth daily.  30 tablet  6  . Multiple Vitamin (MULTIVITAMIN WITH MINERALS) TABS Take 1 tablet by mouth daily.      . nitroGLYCERIN (NITROSTAT) 0.4 MG SL tablet Place 0.4 mg under the tongue every 5 (five) minutes as needed. For chest pain      . ondansetron (ZOFRAN-ODT) 4 MG disintegrating tablet Take 4 mg by mouth every 4 (four) hours as needed. For nausea      . oxyCODONE-acetaminophen (PERCOCET/ROXICET) 5-325 MG per tablet Take 1 tablet by mouth every 6 (six) hours as needed. For pain      . potassium chloride SA (K-DUR,KLOR-CON) 20 MEQ tablet TAKE 1 TABLET BY MOUTH EVERY DAY  30 tablet  6  . spironolactone  (ALDACTONE) 25 MG tablet TAKE 1/2 TABLET BY MOUTH EVERY DAY  45 tablet  3  . traMADol (ULTRAM) 50 MG tablet Take 50 mg by mouth every 6 (six) hours as needed. For pain.      Marland Kitchen warfarin (JANTOVEN) 5 MG tablet Take 1 tablet (5 mg total) by mouth as directed. Take as directed by coumadin clinic no substitute  Use jantoven  45 tablet  3   No current facility-administered medications for this visit.    Allergies  Allergen Reactions  . Ace Inhibitors   . Amlodipine Besylate     Numbness to lips; made legs give out  . Codeine Itching  . Corzide [Nadolol-Bendroflumethiazide]   . Diovan [Valsartan]   . Flagyl [Metronidazole]   . Norvasc [Amlodipine Besylate]     Numbness to lips; made legs give out  . Omeprazole     Shut my kidneys down  . Other     Perfumes smells; paint smells; formaldehyde smells  . Penicillins   . Shellfish Allergy   . Latex Itching and Rash    Review of Systems negative except from HPI and PMH  Physical Exam BP 113/66  Pulse 83  Ht 5' 10.5" (1.791 m)  Wt 182 lb (82.555 kg)  BMI 25.74 kg/m2 Well developed and well nourished in no acute distress HENT normal E scleral and icterus clear Neck Supple JVP flat; carotids brisk and full Clear to ausculation  Regular rate and rhythm, no murmurs gallops or rub Soft with active bowel sounds No clubbing cyanosis  Edema Alert and oriented, grossly normal motor and sensory function Skin Warm and little bit damp      Assessment and  Plan  Atrial fibrillation-permanent  Ischemic heart disease  Implantable defibrillator-CRT-Medtronic  Congestive heart failure-chronic-systolic  Leg cramps   Overall he is euvolemic and stable. We have suggested that we undertake a trial of a limitation of his Pepcid to see whether it is intubated all to his nocturnal cramps.  We will reprogram his device VVIR as he is in permanent atrial fibrillation heart rate excursion is relatively broad but the vast majority of his heart  beats are  adequate as his average runs only about 60 or 70 minute

## 2014-04-05 ENCOUNTER — Ambulatory Visit (INDEPENDENT_AMBULATORY_CARE_PROVIDER_SITE_OTHER): Payer: Medicare Other | Admitting: Pharmacist

## 2014-04-05 DIAGNOSIS — Z7901 Long term (current) use of anticoagulants: Secondary | ICD-10-CM | POA: Diagnosis not present

## 2014-04-05 DIAGNOSIS — I4891 Unspecified atrial fibrillation: Secondary | ICD-10-CM | POA: Diagnosis not present

## 2014-04-05 DIAGNOSIS — I4892 Unspecified atrial flutter: Secondary | ICD-10-CM | POA: Diagnosis not present

## 2014-04-05 DIAGNOSIS — Z5181 Encounter for therapeutic drug level monitoring: Secondary | ICD-10-CM

## 2014-04-05 LAB — POCT INR: INR: 2

## 2014-04-27 IMAGING — NM NM HEPATO W/GB/PHARM/[PERSON_NAME]
2 series · 12 of 12 positions shown · non-contrast
Comparison: Ultrasound 09/08/2012

CLINICAL DATA: Right upper quadrant pain.

NUCLEAR MEDICINE HEPATOBILIARY IMAGING WITH GALLBLADDER EF
TECHNIQUE: Sequential images of the abdomen were obtained [DATE] minutes following intravenous administration of
radiopharmaceutical. After oral ingestion of Ensure, gallbladder
ejection fraction was determined.
Radiopharmaceutical:  5.5 mCi 1c-SSm Choletec

[Series 1: gb hepatobiliary scan · 4.75mm/px · 6 of 60 frames shown (1 of 2)]
[frame 6/60]
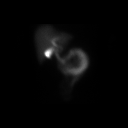
[frame 16/60]
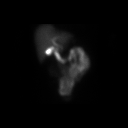
[frame 26/60]
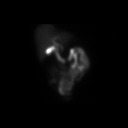
[frame 36/60]
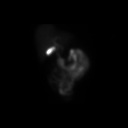
[frame 46/60]
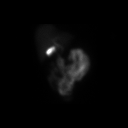
[frame 56/60]
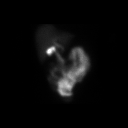

[Series 1: gb hepatobiliary scan · 4.75mm/px · 6 of 60 frames shown (2 of 2)]
[frame 6/60]
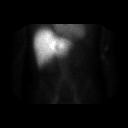
[frame 16/60]
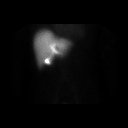
[frame 26/60]
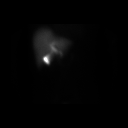
[frame 36/60]
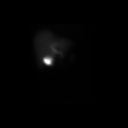
[frame 46/60]
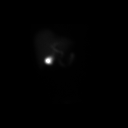
[frame 56/60]
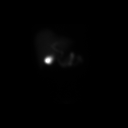

[12 of 12 positions shown; findings below may reference images not displayed]

FINDINGS: There is uniform uptake radiotracer within the liver.
Counts are excreted into the bowel by 35 minutes.  The gallbladder
is visualized by 30 minutes.  Upon administration of the fatty
meal, the gallbladder contracts appropriately with a calculated
ejection fraction equal 59%.

Gallbladder ejection fraction:  59%. Normal gallbladder ejection
fraction with Ensure is greater than 33%.
IMPRESSION: 1..  Normal gallbladder ejection fraction equals 59%.
2.  Patent cystic duct and common bile duct.

## 2014-04-30 ENCOUNTER — Other Ambulatory Visit (HOSPITAL_COMMUNITY): Payer: Self-pay | Admitting: Internal Medicine

## 2014-05-03 ENCOUNTER — Ambulatory Visit (INDEPENDENT_AMBULATORY_CARE_PROVIDER_SITE_OTHER): Payer: Medicare Other | Admitting: Pharmacist

## 2014-05-03 DIAGNOSIS — I4891 Unspecified atrial fibrillation: Secondary | ICD-10-CM | POA: Diagnosis not present

## 2014-05-03 DIAGNOSIS — Z5181 Encounter for therapeutic drug level monitoring: Secondary | ICD-10-CM

## 2014-05-03 DIAGNOSIS — I4892 Unspecified atrial flutter: Secondary | ICD-10-CM

## 2014-05-03 DIAGNOSIS — Z7901 Long term (current) use of anticoagulants: Secondary | ICD-10-CM | POA: Diagnosis not present

## 2014-05-03 LAB — POCT INR: INR: 2.5

## 2014-05-03 IMAGING — CR DG CHEST 1V PORT
1 series · 1 of 1 positions shown · non-contrast
Comparison: 09/16/2012

CLINICAL DATA: Confirm line placement.

PORTABLE CHEST - 1 VIEW

[AP]
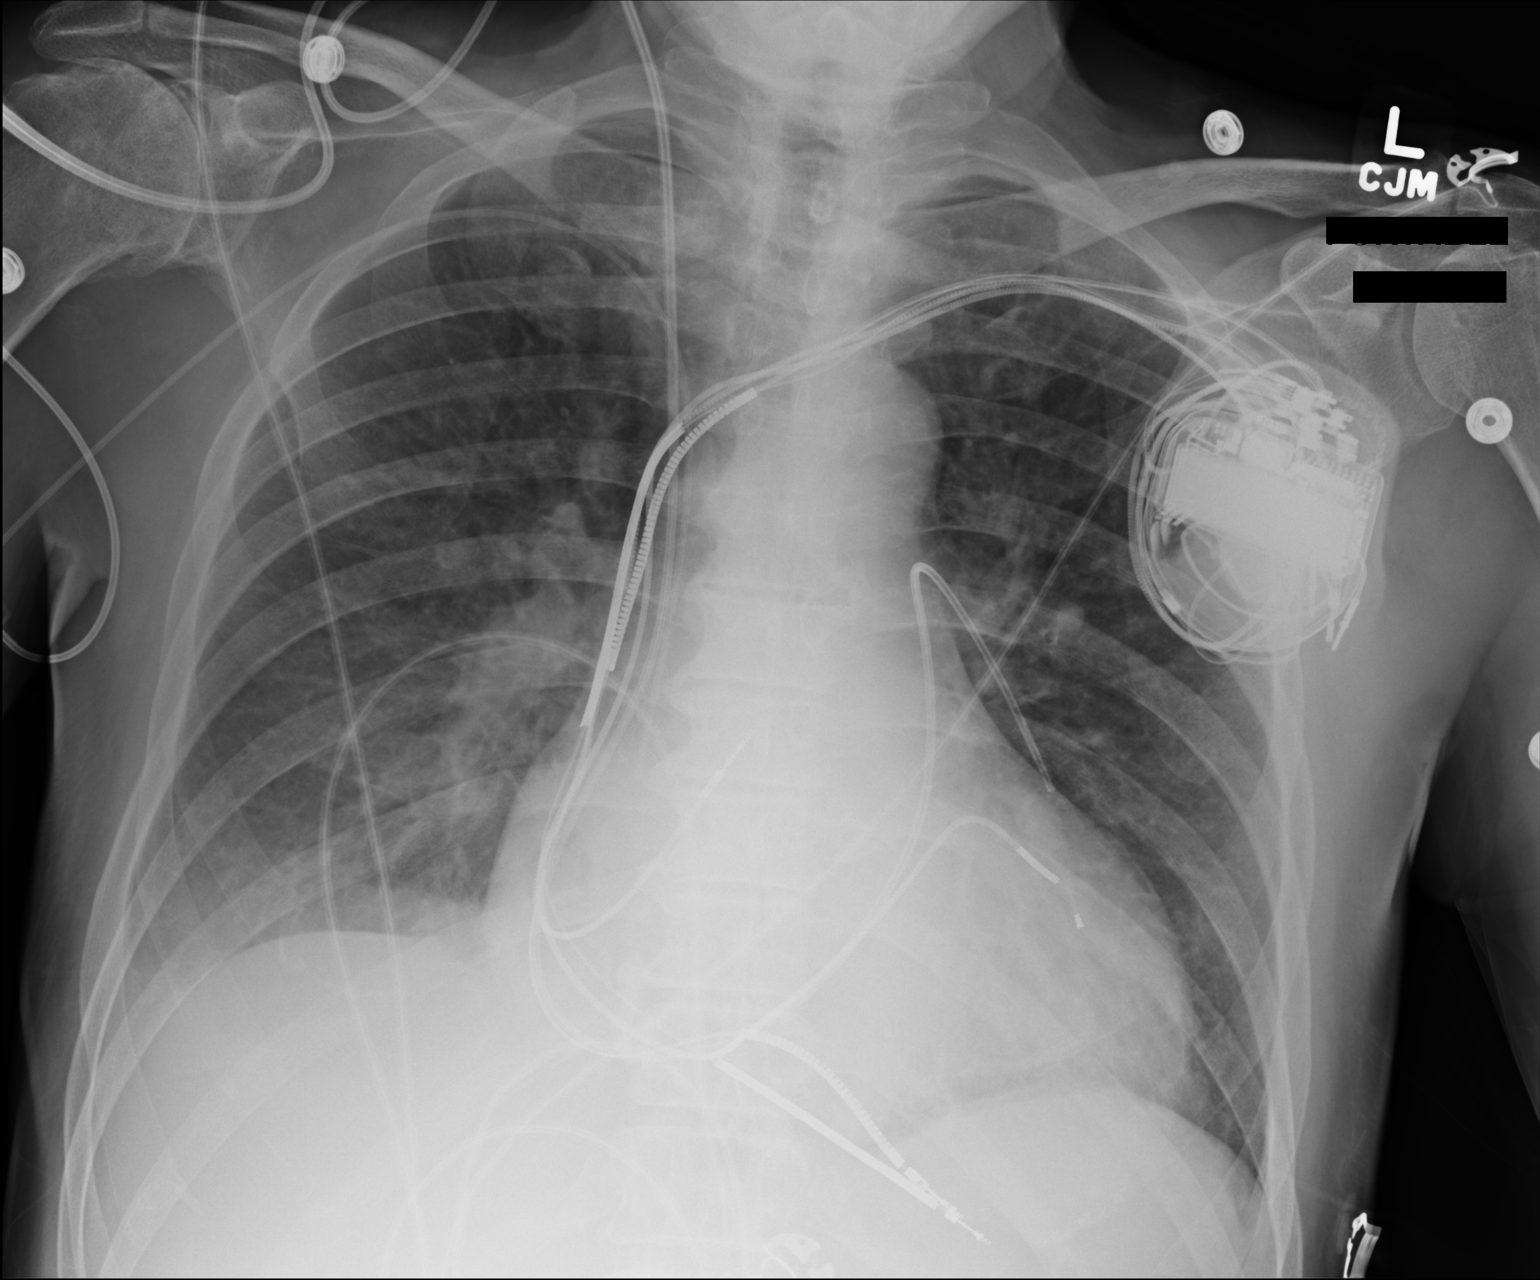

[1 of 1 positions shown; findings below may reference images not displayed]

FINDINGS: Interval placement of Swan-Ganz catheter.  The tip is in
a left lower lobe pulmonary arterial branch several centimeters
beyond the central left pulmonary artery.  No pneumothorax.  Right
PICC line has been placed with the tip at the cavoatrial junction.
Left pacer is unchanged.  There is cardiomegaly.  Bilateral lower
lobe opacities.  Suspect small right effusion.
IMPRESSION: Swan-Ganz catheter tip in the left lower lobe pulmonary arterial
branch.  Right PICC line tip at the cavoatrial junction.  No
pneumothorax.

## 2014-05-21 ENCOUNTER — Other Ambulatory Visit: Payer: Self-pay | Admitting: Internal Medicine

## 2014-05-23 ENCOUNTER — Other Ambulatory Visit: Payer: Self-pay | Admitting: Internal Medicine

## 2014-06-14 ENCOUNTER — Ambulatory Visit (INDEPENDENT_AMBULATORY_CARE_PROVIDER_SITE_OTHER): Payer: Medicare Other | Admitting: Surgery

## 2014-06-14 DIAGNOSIS — Z7901 Long term (current) use of anticoagulants: Secondary | ICD-10-CM

## 2014-06-14 DIAGNOSIS — Z5181 Encounter for therapeutic drug level monitoring: Secondary | ICD-10-CM

## 2014-06-14 DIAGNOSIS — I4892 Unspecified atrial flutter: Secondary | ICD-10-CM

## 2014-06-14 DIAGNOSIS — I4891 Unspecified atrial fibrillation: Secondary | ICD-10-CM | POA: Diagnosis not present

## 2014-06-14 LAB — POCT INR: INR: 3.6

## 2014-06-20 ENCOUNTER — Other Ambulatory Visit: Payer: Self-pay

## 2014-06-20 MED ORDER — CARVEDILOL PHOSPHATE ER 20 MG PO CP24: 20.0000 mg | ORAL_CAPSULE | Freq: Every day | ORAL | Status: DC

## 2014-06-23 ENCOUNTER — Encounter: Payer: Medicare Other | Admitting: *Deleted

## 2014-06-23 ENCOUNTER — Telehealth: Payer: Self-pay | Admitting: Cardiology

## 2014-06-23 NOTE — Telephone Encounter (Signed)
LMOVM reminding pt to send remote transmission.   

## 2014-06-27 ENCOUNTER — Encounter: Payer: Self-pay | Admitting: Cardiology

## 2014-06-30 ENCOUNTER — Telehealth: Payer: Self-pay | Admitting: Internal Medicine

## 2014-06-30 ENCOUNTER — Ambulatory Visit (INDEPENDENT_AMBULATORY_CARE_PROVIDER_SITE_OTHER): Payer: Medicare Other | Admitting: *Deleted

## 2014-06-30 DIAGNOSIS — I5022 Chronic systolic (congestive) heart failure: Secondary | ICD-10-CM | POA: Diagnosis not present

## 2014-06-30 DIAGNOSIS — I2589 Other forms of chronic ischemic heart disease: Secondary | ICD-10-CM

## 2014-06-30 LAB — MDC_IDC_ENUM_SESS_TYPE_REMOTE
Battery Voltage: 2.98 V
Brady Statistic AP VP Percent: 0 %
Brady Statistic AP VS Percent: 0 %
Brady Statistic AS VS Percent: 25.85 %
Brady Statistic RV Percent Paced: 74.15 %
HIGH POWER IMPEDANCE MEASURED VALUE: 456 Ohm
HIGH POWER IMPEDANCE MEASURED VALUE: 61 Ohm
HighPow Impedance: 190 Ohm
HighPow Impedance: 49 Ohm
Lead Channel Impedance Value: 418 Ohm
Lead Channel Impedance Value: 418 Ohm
Lead Channel Impedance Value: 532 Ohm
Lead Channel Impedance Value: 589 Ohm
Lead Channel Impedance Value: 779 Ohm
Lead Channel Pacing Threshold Amplitude: 0.625 V
Lead Channel Pacing Threshold Amplitude: 0.625 V
Lead Channel Pacing Threshold Amplitude: 0.625 V
Lead Channel Pacing Threshold Pulse Width: 0.4 ms
Lead Channel Sensing Intrinsic Amplitude: 1.375 mV
Lead Channel Sensing Intrinsic Amplitude: 12.375 mV
Lead Channel Sensing Intrinsic Amplitude: 12.375 mV
Lead Channel Setting Pacing Amplitude: 2.5 V
Lead Channel Setting Pacing Pulse Width: 0.4 ms
Lead Channel Setting Sensing Sensitivity: 0.3 mV
MDC IDC MSMT LEADCHNL LV PACING THRESHOLD PULSEWIDTH: 0.4 ms
MDC IDC MSMT LEADCHNL RA SENSING INTR AMPL: 1.375 mV
MDC IDC MSMT LEADCHNL RV PACING THRESHOLD PULSEWIDTH: 0.4 ms
MDC IDC SESS DTM: 20150814211220
MDC IDC SET LEADCHNL LV PACING AMPLITUDE: 1.75 V
MDC IDC SET LEADCHNL RV PACING PULSEWIDTH: 0.4 ms
MDC IDC SET ZONE DETECTION INTERVAL: 300 ms
MDC IDC SET ZONE DETECTION INTERVAL: 350 ms
MDC IDC STAT BRADY AS VP PERCENT: 74.15 %
MDC IDC STAT BRADY RA PERCENT PACED: 0 %
Zone Setting Detection Interval: 370 ms
Zone Setting Detection Interval: 450 ms

## 2014-06-30 NOTE — Telephone Encounter (Signed)
LMOVM for pt to return call 

## 2014-06-30 NOTE — Telephone Encounter (Signed)
New Prob   Pt has some questions regarding most recent transmission. States he has had some trouble with his machine. Please call.

## 2014-06-30 NOTE — Telephone Encounter (Signed)
Spoke with pt and attempted to help pt trouble shoot monitor but pt said he the lights would go no further than the second light. I gave pt tech support number to call to help him trouble shoot monitor.

## 2014-07-03 ENCOUNTER — Other Ambulatory Visit (HOSPITAL_COMMUNITY): Payer: Self-pay | Admitting: Internal Medicine

## 2014-07-05 ENCOUNTER — Other Ambulatory Visit: Payer: Self-pay | Admitting: *Deleted

## 2014-07-05 ENCOUNTER — Other Ambulatory Visit (HOSPITAL_COMMUNITY): Payer: Self-pay | Admitting: Internal Medicine

## 2014-07-05 MED ORDER — WARFARIN SODIUM 5 MG PO TABS: ORAL_TABLET | ORAL | Status: DC

## 2014-07-05 NOTE — Telephone Encounter (Signed)
Refill done as requested 

## 2014-07-06 ENCOUNTER — Other Ambulatory Visit: Payer: Self-pay

## 2014-07-06 MED ORDER — COLCHICINE 0.6 MG PO TABS: 0.6000 mg | ORAL_TABLET | Freq: Every day | ORAL | Status: DC | PRN

## 2014-07-11 ENCOUNTER — Encounter: Payer: Self-pay | Admitting: *Deleted

## 2014-07-12 ENCOUNTER — Ambulatory Visit (INDEPENDENT_AMBULATORY_CARE_PROVIDER_SITE_OTHER): Payer: Medicare Other | Admitting: *Deleted

## 2014-07-12 DIAGNOSIS — Z7901 Long term (current) use of anticoagulants: Secondary | ICD-10-CM

## 2014-07-12 DIAGNOSIS — Z5181 Encounter for therapeutic drug level monitoring: Secondary | ICD-10-CM

## 2014-07-12 DIAGNOSIS — I4891 Unspecified atrial fibrillation: Secondary | ICD-10-CM

## 2014-07-12 DIAGNOSIS — I4892 Unspecified atrial flutter: Secondary | ICD-10-CM

## 2014-07-12 LAB — POCT INR: INR: 3.9

## 2014-07-17 ENCOUNTER — Telehealth: Payer: Self-pay | Admitting: Internal Medicine

## 2014-07-17 NOTE — Telephone Encounter (Signed)
New message      Pt got a letter saying we have not received his transmission.  He thought his device was wireless.  Please call

## 2014-07-17 NOTE — Progress Notes (Signed)
Remote ICD transmission.   

## 2014-07-17 NOTE — Telephone Encounter (Signed)
Spoke with pt and informed pt that we received transmission on 06-30-14 pt verbalized understanding.

## 2014-07-20 ENCOUNTER — Encounter: Payer: Self-pay | Admitting: Cardiology

## 2014-07-20 ENCOUNTER — Other Ambulatory Visit (HOSPITAL_COMMUNITY): Payer: Self-pay | Admitting: Internal Medicine

## 2014-07-26 ENCOUNTER — Ambulatory Visit (INDEPENDENT_AMBULATORY_CARE_PROVIDER_SITE_OTHER): Payer: Medicare Other | Admitting: *Deleted

## 2014-07-26 DIAGNOSIS — I4892 Unspecified atrial flutter: Secondary | ICD-10-CM | POA: Diagnosis not present

## 2014-07-26 DIAGNOSIS — I4891 Unspecified atrial fibrillation: Secondary | ICD-10-CM | POA: Diagnosis not present

## 2014-07-26 DIAGNOSIS — Z7901 Long term (current) use of anticoagulants: Secondary | ICD-10-CM | POA: Diagnosis not present

## 2014-07-26 DIAGNOSIS — Z5181 Encounter for therapeutic drug level monitoring: Secondary | ICD-10-CM | POA: Diagnosis not present

## 2014-07-26 LAB — POCT INR: INR: 2

## 2014-07-27 ENCOUNTER — Encounter: Payer: Self-pay | Admitting: Cardiology

## 2014-08-04 ENCOUNTER — Other Ambulatory Visit: Payer: Self-pay | Admitting: *Deleted

## 2014-08-04 ENCOUNTER — Other Ambulatory Visit: Payer: Self-pay | Admitting: Cardiology

## 2014-08-04 ENCOUNTER — Encounter: Payer: Self-pay | Admitting: Internal Medicine

## 2014-08-04 MED ORDER — DIGOXIN 125 MCG PO TABS: 0.1250 mg | ORAL_TABLET | ORAL | Status: DC

## 2014-08-09 ENCOUNTER — Encounter: Payer: Self-pay | Admitting: Internal Medicine

## 2014-08-23 ENCOUNTER — Ambulatory Visit (INDEPENDENT_AMBULATORY_CARE_PROVIDER_SITE_OTHER): Payer: Medicare Other | Admitting: Cardiology

## 2014-08-23 ENCOUNTER — Encounter: Payer: Self-pay | Admitting: Cardiology

## 2014-08-23 ENCOUNTER — Ambulatory Visit (INDEPENDENT_AMBULATORY_CARE_PROVIDER_SITE_OTHER): Payer: Medicare Other | Admitting: Pharmacist Clinician (PhC)/ Clinical Pharmacy Specialist

## 2014-08-23 VITALS — BP 110/71 | HR 70 | Ht 68.0 in | Wt 181.4 lb

## 2014-08-23 DIAGNOSIS — Z7901 Long term (current) use of anticoagulants: Secondary | ICD-10-CM | POA: Diagnosis not present

## 2014-08-23 DIAGNOSIS — I4892 Unspecified atrial flutter: Secondary | ICD-10-CM | POA: Diagnosis not present

## 2014-08-23 DIAGNOSIS — I482 Chronic atrial fibrillation, unspecified: Secondary | ICD-10-CM

## 2014-08-23 DIAGNOSIS — I2510 Atherosclerotic heart disease of native coronary artery without angina pectoris: Secondary | ICD-10-CM | POA: Diagnosis not present

## 2014-08-23 DIAGNOSIS — I4891 Unspecified atrial fibrillation: Secondary | ICD-10-CM

## 2014-08-23 DIAGNOSIS — Z5181 Encounter for therapeutic drug level monitoring: Secondary | ICD-10-CM

## 2014-08-23 LAB — POCT INR: INR: 2.5

## 2014-08-23 NOTE — Patient Instructions (Signed)
Your physician recommends that you schedule a follow-up appointment in: 6 months with Dr. Percival Spanish  We are doing some Bloodwork today

## 2014-08-23 NOTE — Progress Notes (Signed)
HPI   The patient presents for followup of his cardiomyopathy.  Since I last saw him he has had no acute cardiovascular complaints. He said some sinus trouble again today.  Of note this has been worse in the past when he wasn't taking ACE inhibitors.   He denies any acute cardiovascular symptoms. He's not having any new shortness of breath, PND or orthopnea. He's not having any new palpitations, presyncope or syncope. He has had no weight gain or edema. He has had no chest pressure, neck or arm discomfort.  He has not been walking because of the rain.  His weights are stable.  Allergies  Allergen Reactions  . Ace Inhibitors   . Amlodipine Besylate     Numbness to lips; made legs give out  . Codeine Itching  . Corzide [Nadolol-Bendroflumethiazide]   . Diovan [Valsartan]   . Flagyl [Metronidazole]   . Norvasc [Amlodipine Besylate]     Numbness to lips; made legs give out  . Omeprazole     Shut my kidneys down  . Other     Perfumes smells; paint smells; formaldehyde smells  . Penicillins   . Shellfish Allergy   . Latex Itching and Rash    Current Outpatient Prescriptions  Medication Sig Dispense Refill  . Ascorbic Acid (VITAMIN C) POWD Takes 1/8 of a teaspoon 1-2 times a day      . bisoprolol (ZEBETA) 5 MG tablet TAKE 1/2 TABLET BY MOUTH DAILY  45 tablet  3  . Calcium-Magnesium 500-250 MG TABS Take 1 tablet by mouth daily.       . carvedilol (COREG CR) 20 MG 24 hr capsule Take 1 capsule (20 mg total) by mouth daily.  30 capsule  3  . co-enzyme Q-10 30 MG capsule Take 100 mg by mouth daily.       . colchicine 0.6 MG tablet Take 1 tablet (0.6 mg total) by mouth daily as needed. For gout flareups.  30 tablet  3  . DIGOX 125 MCG tablet       . digoxin (LANOXIN) 0.125 MG tablet Take 1 tablet (0.125 mg total) by mouth every other day.  15 tablet  0  . diphenhydrAMINE (BENADRYL) 25 mg capsule Take 25 mg by mouth every 6 (six) hours as needed. For allergies.      . famotidine (PEPCID) 40  MG tablet Take 1 tablet (40 mg total) by mouth 2 (two) times daily.  60 tablet  1  . fish oil-omega-3 fatty acids 1000 MG capsule Take 2 g by mouth daily.      . furosemide (LASIX) 40 MG tablet TAKE 1 TABLET BY MOUTH daily      . JANTOVEN 5 MG tablet       . lisinopril (PRINIVIL,ZESTRIL) 20 MG tablet Take 1 tablet (20 mg total) by mouth daily.  30 tablet  6  . Multiple Vitamin (MULTIVITAMIN WITH MINERALS) TABS Take 1 tablet by mouth daily.      . nitroGLYCERIN (NITROSTAT) 0.4 MG SL tablet Place 0.4 mg under the tongue every 5 (five) minutes as needed. For chest pain      . ondansetron (ZOFRAN-ODT) 4 MG disintegrating tablet Take 4 mg by mouth every 4 (four) hours as needed. For nausea      . Potassium Chloride ER 20 MEQ TBCR       . potassium chloride SA (K-DUR,KLOR-CON) 20 MEQ tablet TAKE 1 TABLET BY MOUTH EVERY DAY  30 tablet  6  . spironolactone (  ALDACTONE) 25 MG tablet TAKE 1/2 TABLET BY MOUTH EVERY DAY  45 tablet  3  . warfarin (JANTOVEN) 5 MG tablet Take as directed by coumadin clinic no substitute  Use jantoven  45 tablet  3   No current facility-administered medications for this visit.    Past Medical History  Diagnosis Date  . Ischemic cardiomyopathy     Myoview 02/2011  EF 28%  Coronary artery disease  (total occlusion of the right coronary artery, left-to-right )  . Atrial flutter     s/p TEE guided cardioversion  . Nonsustained ventricular tachycardia   . LBBB (left bundle branch block)   . Chronic drug-induced interstitial lung disorders   . Acute on chronic renal insufficiency   . Coronary artery disease     total occlusion of the right coronary artery, left to right collaterals.  He had Cypher stenting to the circumflex in  July 2003  . Seizure disorder   . AICD (automatic cardioverter/defibrillator) present     Medtronic CRT  . Hyponatremia   . 6949-lead     replaced 02/2011  . Hyperlipemia   . Hypertension   . Fatty liver     CT  . Family hx of colon cancer     2  SISTERS  . CHF (congestive heart failure)   . Asthma   . COPD (chronic obstructive pulmonary disease)     " very MILD "  . KZSWFUXN(235.5)     Past Surgical History  Procedure Laterality Date  . Insert / replace / remove pacemaker      AICD medtronic  . Hernia repair    . Esophagogastroduodenoscopy  09/13/2012    Procedure: ESOPHAGOGASTRODUODENOSCOPY (EGD);  Surgeon: Ladene Artist, MD,FACG;  Location: [facility];  Service: Endoscopy;  Laterality: N/A;  . Coronary angioplasty      ROS:  Nasal congestion.   Otherwise as stated in the HPI and negative for all other systems.  PHYSICAL EXAM BP 110/71  Pulse 70  Ht 5\' 8"  (1.727 m)  Wt 181 lb 6.4 oz (82.283 kg)  BMI 27.59 kg/m2  SpO2 95% GENERAL:  Well appearing NECK:  No jugular venous distention, waveform within normal limits, carotid upstroke brisk and symmetric, no bruits, no thyromegaly LUNGS:  Clear to auscultation bilaterally CHEST:  ICD pocket well healed. HEART:  PMI not displaced or sustained,S1 and S2 within normal limits, no S3, no S4, no clicks, no rubs, no murmurs ABD:  Flat, positive bowel sounds normal in frequency in pitch, no bruits, no rebound, no guarding, no midline pulsatile mass, no hepatomegaly, no splenomegaly EXT:  2 plus pulses throughout, no edema, no cyanosis no clubbing  EKG:  Atrial fibrillation with demand ventricular pacing rate 70.  08/23/2014  ASSESSMENT AND PLAN  Chronic Systolic CHF:  He is on an unusual med regimen but doing well.  The current medical regimen is effective;  continue present plan and medications.  Coronary Artery Disease: The patient has no new sypmtoms.  No further cardiovascular testing is indicated.  We will continue with aggressive risk reduction and meds as listed.  Hypertension:   As above the pressure has been at target. He will continue the meds as listed.  Atrial Flutter:  The patient  tolerates this rhythm and rate control and anticoagulation. We will continue  with the meds as listed.  Chronic Kidney Disease:   I will check a BMET today.

## 2014-08-24 ENCOUNTER — Other Ambulatory Visit: Payer: Self-pay | Admitting: *Deleted

## 2014-08-24 DIAGNOSIS — E878 Other disorders of electrolyte and fluid balance, not elsewhere classified: Secondary | ICD-10-CM

## 2014-08-24 LAB — BASIC METABOLIC PANEL
BUN: 27 mg/dL — ABNORMAL HIGH (ref 6–23)
CALCIUM: 9.3 mg/dL (ref 8.4–10.5)
CO2: 26 meq/L (ref 19–32)
CREATININE: 1.6 mg/dL — AB (ref 0.50–1.35)
Chloride: 99 mEq/L (ref 96–112)
Glucose, Bld: 89 mg/dL (ref 70–99)
Potassium: 5.4 mEq/L — ABNORMAL HIGH (ref 3.5–5.3)
Sodium: 134 mEq/L — ABNORMAL LOW (ref 135–145)

## 2014-09-03 ENCOUNTER — Other Ambulatory Visit (HOSPITAL_COMMUNITY): Payer: Self-pay | Admitting: Internal Medicine

## 2014-09-04 ENCOUNTER — Other Ambulatory Visit (HOSPITAL_COMMUNITY): Payer: Self-pay | Admitting: Internal Medicine

## 2014-09-04 DIAGNOSIS — I5022 Chronic systolic (congestive) heart failure: Secondary | ICD-10-CM

## 2014-09-06 ENCOUNTER — Telehealth: Payer: Self-pay | Admitting: Cardiology

## 2014-09-06 DIAGNOSIS — I2510 Atherosclerotic heart disease of native coronary artery without angina pectoris: Secondary | ICD-10-CM

## 2014-09-06 DIAGNOSIS — Z79899 Other long term (current) drug therapy: Secondary | ICD-10-CM

## 2014-09-06 DIAGNOSIS — E878 Other disorders of electrolyte and fluid balance, not elsewhere classified: Secondary | ICD-10-CM

## 2014-09-06 NOTE — Telephone Encounter (Signed)
New Message  Pt called states that he was advised to have lab visit within 2 weeks. No new orders on file and it was not in the last OV. Please assist

## 2014-09-06 NOTE — Telephone Encounter (Signed)
CALLED TO SPEAK TO PATIENT. HE IS NOT PRESENT  , WIFE STATES PATIENT WOULD LIKE TO GO TO THE [address] OFFICE TO HAVE LABS DRAWN. WIFE STATES HE WILL GO ON Friday 10 /23/15

## 2014-09-08 ENCOUNTER — Other Ambulatory Visit: Payer: Medicare Other

## 2014-09-11 ENCOUNTER — Other Ambulatory Visit: Payer: Self-pay | Admitting: Cardiology

## 2014-09-12 ENCOUNTER — Other Ambulatory Visit (INDEPENDENT_AMBULATORY_CARE_PROVIDER_SITE_OTHER): Payer: Medicare Other | Admitting: *Deleted

## 2014-09-12 DIAGNOSIS — I270 Primary pulmonary hypertension: Secondary | ICD-10-CM

## 2014-09-12 LAB — BASIC METABOLIC PANEL
BUN: 23 mg/dL (ref 6–23)
CHLORIDE: 99 meq/L (ref 96–112)
CO2: 24 mEq/L (ref 19–32)
CREATININE: 1.5 mg/dL (ref 0.4–1.5)
Calcium: 9.1 mg/dL (ref 8.4–10.5)
GFR: 49.19 mL/min — ABNORMAL LOW (ref >60.00)
Glucose, Bld: 86 mg/dL (ref 70–99)
POTASSIUM: 5 meq/L (ref 3.5–5.1)
SODIUM: 130 meq/L — AB (ref 135–145)

## 2014-09-19 ENCOUNTER — Other Ambulatory Visit: Payer: Medicare Other

## 2014-09-20 ENCOUNTER — Ambulatory Visit (INDEPENDENT_AMBULATORY_CARE_PROVIDER_SITE_OTHER): Payer: Medicare Other | Admitting: *Deleted

## 2014-09-20 DIAGNOSIS — Z5181 Encounter for therapeutic drug level monitoring: Secondary | ICD-10-CM

## 2014-09-20 DIAGNOSIS — I482 Chronic atrial fibrillation, unspecified: Secondary | ICD-10-CM

## 2014-09-20 DIAGNOSIS — Z7901 Long term (current) use of anticoagulants: Secondary | ICD-10-CM | POA: Diagnosis not present

## 2014-09-20 DIAGNOSIS — I4892 Unspecified atrial flutter: Secondary | ICD-10-CM | POA: Diagnosis not present

## 2014-09-20 DIAGNOSIS — I4891 Unspecified atrial fibrillation: Secondary | ICD-10-CM | POA: Diagnosis not present

## 2014-09-20 LAB — POCT INR: INR: 2.1

## 2014-10-02 ENCOUNTER — Telehealth: Payer: Self-pay | Admitting: Cardiology

## 2014-10-02 ENCOUNTER — Ambulatory Visit (INDEPENDENT_AMBULATORY_CARE_PROVIDER_SITE_OTHER): Payer: Medicare Other | Admitting: *Deleted

## 2014-10-02 NOTE — Telephone Encounter (Signed)
Spoke with pt and reminded pt of remote transmission that is due today. Pt verbalized understanding.   

## 2014-10-06 ENCOUNTER — Encounter: Payer: Self-pay | Admitting: Cardiology

## 2014-10-13 ENCOUNTER — Other Ambulatory Visit: Payer: Self-pay | Admitting: Internal Medicine

## 2014-10-13 ENCOUNTER — Telehealth: Payer: Self-pay | Admitting: Cardiology

## 2014-10-13 DIAGNOSIS — I255 Ischemic cardiomyopathy: Secondary | ICD-10-CM | POA: Diagnosis not present

## 2014-10-13 DIAGNOSIS — I5022 Chronic systolic (congestive) heart failure: Secondary | ICD-10-CM

## 2014-10-13 NOTE — Telephone Encounter (Signed)
New Message  Pt called requests a call back to determine if the transmission was received//sr

## 2014-10-13 NOTE — Telephone Encounter (Signed)
Transmission was not received.  Patient to resend later today.

## 2014-10-17 NOTE — Progress Notes (Signed)
Remote ICD transmission.   

## 2014-10-18 ENCOUNTER — Ambulatory Visit (INDEPENDENT_AMBULATORY_CARE_PROVIDER_SITE_OTHER): Payer: Medicare Other | Admitting: Pharmacist

## 2014-10-18 DIAGNOSIS — I482 Chronic atrial fibrillation, unspecified: Secondary | ICD-10-CM

## 2014-10-18 DIAGNOSIS — I4892 Unspecified atrial flutter: Secondary | ICD-10-CM | POA: Diagnosis not present

## 2014-10-18 DIAGNOSIS — Z5181 Encounter for therapeutic drug level monitoring: Secondary | ICD-10-CM | POA: Diagnosis not present

## 2014-10-18 DIAGNOSIS — I4891 Unspecified atrial fibrillation: Secondary | ICD-10-CM

## 2014-10-18 DIAGNOSIS — Z7901 Long term (current) use of anticoagulants: Secondary | ICD-10-CM | POA: Diagnosis not present

## 2014-10-18 LAB — MDC_IDC_ENUM_SESS_TYPE_REMOTE
Battery Voltage: 2.94 V
Brady Statistic AS VP Percent: 76.95 %
Brady Statistic AS VS Percent: 23.05 %
Brady Statistic RA Percent Paced: 0 %
Brady Statistic RV Percent Paced: 76.95 %
HIGH POWER IMPEDANCE MEASURED VALUE: 456 Ohm
HighPow Impedance: 49 Ohm
HighPow Impedance: 63 Ohm
Lead Channel Impedance Value: 399 Ohm
Lead Channel Impedance Value: 456 Ohm
Lead Channel Impedance Value: 551 Ohm
Lead Channel Impedance Value: 665 Ohm
Lead Channel Impedance Value: 893 Ohm
Lead Channel Pacing Threshold Amplitude: 0.625 V
Lead Channel Pacing Threshold Amplitude: 0.75 V
Lead Channel Pacing Threshold Pulse Width: 0.4 ms
Lead Channel Sensing Intrinsic Amplitude: 1 mV
Lead Channel Sensing Intrinsic Amplitude: 1 mV
Lead Channel Sensing Intrinsic Amplitude: 13.25 mV
Lead Channel Sensing Intrinsic Amplitude: 13.25 mV
Lead Channel Setting Pacing Amplitude: 1.75 V
Lead Channel Setting Pacing Pulse Width: 0.4 ms
Lead Channel Setting Sensing Sensitivity: 0.3 mV
MDC IDC MSMT LEADCHNL RV PACING THRESHOLD PULSEWIDTH: 0.4 ms
MDC IDC SESS DTM: 20151127202729
MDC IDC SET LEADCHNL LV PACING PULSEWIDTH: 0.4 ms
MDC IDC SET LEADCHNL RV PACING AMPLITUDE: 2.5 V
MDC IDC SET ZONE DETECTION INTERVAL: 350 ms
MDC IDC SET ZONE DETECTION INTERVAL: 450 ms
MDC IDC STAT BRADY AP VP PERCENT: 0 %
MDC IDC STAT BRADY AP VS PERCENT: 0 %
Zone Setting Detection Interval: 300 ms
Zone Setting Detection Interval: 370 ms

## 2014-10-18 LAB — POCT INR: INR: 3.5

## 2014-10-23 ENCOUNTER — Other Ambulatory Visit (HOSPITAL_COMMUNITY): Payer: Self-pay | Admitting: Internal Medicine

## 2014-10-23 DIAGNOSIS — I5022 Chronic systolic (congestive) heart failure: Secondary | ICD-10-CM

## 2014-10-26 ENCOUNTER — Encounter (HOSPITAL_COMMUNITY): Payer: Self-pay | Admitting: Internal Medicine

## 2014-10-26 DIAGNOSIS — M5416 Radiculopathy, lumbar region: Secondary | ICD-10-CM | POA: Diagnosis not present

## 2014-10-26 DIAGNOSIS — M9905 Segmental and somatic dysfunction of pelvic region: Secondary | ICD-10-CM | POA: Diagnosis not present

## 2014-10-26 DIAGNOSIS — M9903 Segmental and somatic dysfunction of lumbar region: Secondary | ICD-10-CM | POA: Diagnosis not present

## 2014-10-26 DIAGNOSIS — M9901 Segmental and somatic dysfunction of cervical region: Secondary | ICD-10-CM | POA: Diagnosis not present

## 2014-10-26 DIAGNOSIS — M542 Cervicalgia: Secondary | ICD-10-CM | POA: Diagnosis not present

## 2014-11-02 DIAGNOSIS — M9903 Segmental and somatic dysfunction of lumbar region: Secondary | ICD-10-CM | POA: Diagnosis not present

## 2014-11-02 DIAGNOSIS — M9901 Segmental and somatic dysfunction of cervical region: Secondary | ICD-10-CM | POA: Diagnosis not present

## 2014-11-02 DIAGNOSIS — M9905 Segmental and somatic dysfunction of pelvic region: Secondary | ICD-10-CM | POA: Diagnosis not present

## 2014-11-02 DIAGNOSIS — M542 Cervicalgia: Secondary | ICD-10-CM | POA: Diagnosis not present

## 2014-11-02 DIAGNOSIS — M5416 Radiculopathy, lumbar region: Secondary | ICD-10-CM | POA: Diagnosis not present

## 2014-11-03 ENCOUNTER — Ambulatory Visit (INDEPENDENT_AMBULATORY_CARE_PROVIDER_SITE_OTHER): Payer: Medicare Other | Admitting: Cardiology

## 2014-11-03 ENCOUNTER — Encounter: Payer: Self-pay | Admitting: Cardiology

## 2014-11-03 VITALS — BP 110/60 | HR 62 | Ht 68.0 in | Wt 183.0 lb

## 2014-11-03 DIAGNOSIS — I2510 Atherosclerotic heart disease of native coronary artery without angina pectoris: Secondary | ICD-10-CM

## 2014-11-03 DIAGNOSIS — I5022 Chronic systolic (congestive) heart failure: Secondary | ICD-10-CM | POA: Diagnosis not present

## 2014-11-03 MED ORDER — BISOPROLOL FUMARATE 5 MG PO TABS: 5.0000 mg | ORAL_TABLET | Freq: Every day | ORAL | Status: DC

## 2014-11-03 NOTE — Progress Notes (Signed)
HPI   The patient presents for followup of his cardiomyopathy. Since I last saw him he had a device check demonstrated some atrial fibrillation and he was pacing 77% of the time. His A. Fib was with rapid rate. He has had probably some increased fatigue and mild shortness of breath. He has not had any overt symptoms however such as PND or orthopnea. He's not having any new palpitations, presyncope or syncope. He has had no weight gain or edema. He has had no chest pressure, neck or arm discomfort.  He has not been walking because of the rain.  His weights are stable.    Allergies  Allergen Reactions  . Ace Inhibitors   . Amlodipine Besylate     Numbness to lips; made legs give out  . Codeine Itching  . Corzide [Nadolol-Bendroflumethiazide]   . Diovan [Valsartan]   . Flagyl [Metronidazole]   . Norvasc [Amlodipine Besylate]     Numbness to lips; made legs give out  . Omeprazole     Shut my kidneys down  . Other     Perfumes smells; paint smells; formaldehyde smells  . Penicillins   . Shellfish Allergy   . Latex Itching and Rash    Current Outpatient Prescriptions  Medication Sig Dispense Refill  . Ascorbic Acid (VITAMIN C) POWD Takes 1/8 of a teaspoon 1-2 times a day    . bisoprolol (ZEBETA) 5 MG tablet TAKE 1/2 TABLET BY MOUTH DAILY 45 tablet 3  . Calcium-Magnesium 500-250 MG TABS Take 1 tablet by mouth daily.     . carvedilol (COREG CR) 20 MG 24 hr capsule Take 1 capsule (20 mg total) by mouth daily. 30 capsule 3  . co-enzyme Q-10 30 MG capsule Take 100 mg by mouth daily.     . colchicine 0.6 MG tablet Take 1 tablet (0.6 mg total) by mouth daily as needed. For gout flareups. 30 tablet 3  . DIGOX 125 MCG tablet TAKE 1 TABLET BY MOUTH EVERY OTHER DAY 45 tablet 1  . diphenhydrAMINE (BENADRYL) 25 mg capsule Take 25 mg by mouth every 6 (six) hours as needed. For allergies.    . famotidine (PEPCID) 40 MG tablet Take 1 tablet (40 mg total) by mouth 2 (two) times daily. 60 tablet 1  .  fish oil-omega-3 fatty acids 1000 MG capsule Take 2 g by mouth daily.    . furosemide (LASIX) 40 MG tablet TAKE 1 TABLET BY MOUTH daily    . hydrALAZINE (APRESOLINE) 50 MG tablet TAKE 1 TABLET BY MOUTH EVERY 8 HOURS 90 tablet 6  . JANTOVEN 5 MG tablet     . lisinopril (PRINIVIL,ZESTRIL) 20 MG tablet TAKE 1 TABLET BY MOUTH DAILY 90 tablet 3  . Multiple Vitamin (MULTIVITAMIN WITH MINERALS) TABS Take 1 tablet by mouth daily.    . nitroGLYCERIN (NITROSTAT) 0.4 MG SL tablet Place 0.4 mg under the tongue every 5 (five) minutes as needed. For chest pain    . ondansetron (ZOFRAN-ODT) 4 MG disintegrating tablet Take 4 mg by mouth every 4 (four) hours as needed. For nausea    . Potassium Chloride ER 20 MEQ TBCR     . spironolactone (ALDACTONE) 25 MG tablet TAKE 1/2 TABLET BY MOUTH EVERY DAY 45 tablet 3  . warfarin (JANTOVEN) 5 MG tablet Take as directed by coumadin clinic no substitute  Use jantoven 45 tablet 3   No current facility-administered medications for this visit.    Past Medical History  Diagnosis Date  .  Ischemic cardiomyopathy     Myoview 02/2011  EF 28%  Coronary artery disease  (total occlusion of the right coronary artery, left-to-right )  . Atrial flutter     s/p TEE guided cardioversion  . Nonsustained ventricular tachycardia   . LBBB (left bundle branch block)   . Chronic drug-induced interstitial lung disorders   . Acute on chronic renal insufficiency   . Coronary artery disease     total occlusion of the right coronary artery, left to right collaterals.  He had Cypher stenting to the circumflex in  July 2003  . Seizure disorder   . AICD (automatic cardioverter/defibrillator) present     Medtronic CRT  . Hyponatremia   . 6949-lead     replaced 02/2011  . Hyperlipemia   . Hypertension   . Fatty liver     CT  . Family hx of colon cancer     2 SISTERS  . CHF (congestive heart failure)   . Asthma   . COPD (chronic obstructive pulmonary disease)     " very MILD "  .  LFYBOFBP(102.5)     Past Surgical History  Procedure Laterality Date  . Insert / replace / remove pacemaker      AICD medtronic  . Hernia repair    . Esophagogastroduodenoscopy  09/13/2012    Procedure: ESOPHAGOGASTRODUODENOSCOPY (EGD);  Surgeon: Ladene Artist, MD,FACG;  Location: [facility];  Service: Endoscopy;  Laterality: N/A;  . Coronary angioplasty    . Right heart catheterization N/A 09/17/2012    Procedure: RIGHT HEART CATH;  Surgeon: Jolaine Artist, MD;  Location: [facility] CATH LAB;  Service: Cardiovascular;  Laterality: N/A;    ROS:  Nasal congestion.   Otherwise as stated in the HPI and negative for all other systems.  PHYSICAL EXAM BP 110/60 mmHg  Pulse 62  Ht 5\' 8"  (1.727 m)  Wt 183 lb (83.008 kg)  BMI 27.83 kg/m2 GENERAL:  Well appearing NECK:  No jugular venous distention, waveform within normal limits, carotid upstroke brisk and symmetric, no bruits, no thyromegaly LUNGS:  Clear to auscultation bilaterally CHEST:  ICD pocket well healed. HEART:  PMI not displaced or sustained,S1 and S2 within normal limits, no S3, no S4, no clicks, no rubs, no murmurs ABD:  Flat, positive bowel sounds normal in frequency in pitch, no bruits, no rebound, no guarding, no midline pulsatile mass, no hepatomegaly, no splenomegaly EXT:  2 plus pulses throughout, no edema, no cyanosis no clubbing  EKG:  Atrial fibrillation with demand ventricular pacing rate 70.  11/03/2014  ASSESSMENT AND PLAN  Chronic Systolic CHF:  He's not dose in combination of beta blockers. He however, I am going to increase his bisoprolol to 5 mg daily. He might do better with 100% pacing and she were about to the A. Fib with rapid rate.  Coronary Artery Disease: The patient has no new sypmtoms.  No further cardiovascular testing is indicated.  We will continue with aggressive risk reduction and meds as listed.  Hypertension:   This is being managed in the context of treating his CHF  Atrial Flutter:  The  patient  tolerates anticoagulation. We will continue with the meds as listed.  Chronic Kidney Disease:   This has been stable.

## 2014-11-03 NOTE — Patient Instructions (Signed)
Your physician recommends that you schedule a follow-up appointment in: 4 months with Dr. Percival Spanish  Increase your bisoprolol to 5 mg daily

## 2014-11-08 ENCOUNTER — Ambulatory Visit (INDEPENDENT_AMBULATORY_CARE_PROVIDER_SITE_OTHER): Payer: Medicare Other | Admitting: *Deleted

## 2014-11-08 DIAGNOSIS — I4891 Unspecified atrial fibrillation: Secondary | ICD-10-CM | POA: Diagnosis not present

## 2014-11-08 DIAGNOSIS — I4892 Unspecified atrial flutter: Secondary | ICD-10-CM | POA: Diagnosis not present

## 2014-11-08 DIAGNOSIS — Z7901 Long term (current) use of anticoagulants: Secondary | ICD-10-CM

## 2014-11-08 DIAGNOSIS — I482 Chronic atrial fibrillation, unspecified: Secondary | ICD-10-CM

## 2014-11-08 DIAGNOSIS — Z5181 Encounter for therapeutic drug level monitoring: Secondary | ICD-10-CM | POA: Diagnosis not present

## 2014-11-08 LAB — POCT INR: INR: 2.6

## 2014-11-13 ENCOUNTER — Encounter: Payer: Self-pay | Admitting: Internal Medicine

## 2014-11-13 ENCOUNTER — Other Ambulatory Visit: Payer: Self-pay

## 2014-11-13 MED ORDER — CARVEDILOL PHOSPHATE ER 20 MG PO CP24: 20.0000 mg | ORAL_CAPSULE | Freq: Every day | ORAL | Status: DC

## 2014-12-03 ENCOUNTER — Other Ambulatory Visit: Payer: Self-pay | Admitting: Cardiology

## 2014-12-06 ENCOUNTER — Ambulatory Visit (INDEPENDENT_AMBULATORY_CARE_PROVIDER_SITE_OTHER): Payer: Medicare Other | Admitting: *Deleted

## 2014-12-06 DIAGNOSIS — I482 Chronic atrial fibrillation, unspecified: Secondary | ICD-10-CM

## 2014-12-06 DIAGNOSIS — I4892 Unspecified atrial flutter: Secondary | ICD-10-CM | POA: Diagnosis not present

## 2014-12-06 DIAGNOSIS — I4891 Unspecified atrial fibrillation: Secondary | ICD-10-CM

## 2014-12-06 DIAGNOSIS — Z5181 Encounter for therapeutic drug level monitoring: Secondary | ICD-10-CM | POA: Diagnosis not present

## 2014-12-06 DIAGNOSIS — Z7901 Long term (current) use of anticoagulants: Secondary | ICD-10-CM | POA: Diagnosis not present

## 2014-12-06 LAB — POCT INR: INR: 2.6

## 2014-12-14 ENCOUNTER — Telehealth: Payer: Self-pay | Admitting: Cardiology

## 2014-12-14 NOTE — Telephone Encounter (Signed)
New message      Pt needs prior authorization for coreg CR.  Please fax to [phone] medicare.  Have 2 weeks of medication left.

## 2014-12-14 NOTE — Telephone Encounter (Signed)
Message sent to Dr.Hochrein's nurse [place].

## 2014-12-22 NOTE — Telephone Encounter (Signed)
Patient has not received a response from this request.

## 2014-12-27 ENCOUNTER — Other Ambulatory Visit: Payer: Self-pay | Admitting: *Deleted

## 2014-12-27 MED ORDER — CARVEDILOL 6.25 MG PO TABS: 6.2500 mg | ORAL_TABLET | Freq: Two times a day (BID) | ORAL | Status: DC

## 2014-12-27 NOTE — Telephone Encounter (Signed)
Coreg cr discontinued and coreg 6.25 bid put in, pt informed

## 2014-12-29 DIAGNOSIS — Z85828 Personal history of other malignant neoplasm of skin: Secondary | ICD-10-CM | POA: Diagnosis not present

## 2014-12-29 DIAGNOSIS — L570 Actinic keratosis: Secondary | ICD-10-CM | POA: Diagnosis not present

## 2015-01-04 ENCOUNTER — Ambulatory Visit (INDEPENDENT_AMBULATORY_CARE_PROVIDER_SITE_OTHER): Payer: Medicare Other | Admitting: *Deleted

## 2015-01-04 DIAGNOSIS — I482 Chronic atrial fibrillation, unspecified: Secondary | ICD-10-CM

## 2015-01-04 DIAGNOSIS — I4891 Unspecified atrial fibrillation: Secondary | ICD-10-CM

## 2015-01-04 DIAGNOSIS — I4892 Unspecified atrial flutter: Secondary | ICD-10-CM

## 2015-01-04 DIAGNOSIS — Z7901 Long term (current) use of anticoagulants: Secondary | ICD-10-CM

## 2015-01-04 DIAGNOSIS — Z5181 Encounter for therapeutic drug level monitoring: Secondary | ICD-10-CM

## 2015-01-04 LAB — POCT INR: INR: 2.6

## 2015-01-15 ENCOUNTER — Ambulatory Visit (INDEPENDENT_AMBULATORY_CARE_PROVIDER_SITE_OTHER): Payer: Medicare Other | Admitting: *Deleted

## 2015-01-15 DIAGNOSIS — I255 Ischemic cardiomyopathy: Secondary | ICD-10-CM

## 2015-01-15 DIAGNOSIS — I5022 Chronic systolic (congestive) heart failure: Secondary | ICD-10-CM | POA: Diagnosis not present

## 2015-01-15 LAB — MDC_IDC_ENUM_SESS_TYPE_REMOTE
Battery Voltage: 2.9 V
Brady Statistic AP VS Percent: 0 %
Brady Statistic AS VS Percent: 18.22 %
Brady Statistic RA Percent Paced: 0 %
Brady Statistic RV Percent Paced: 81.78 %
HIGH POWER IMPEDANCE MEASURED VALUE: 45 Ohm
HIGH POWER IMPEDANCE MEASURED VALUE: 55 Ohm
HighPow Impedance: 418 Ohm
Lead Channel Impedance Value: 399 Ohm
Lead Channel Impedance Value: 513 Ohm
Lead Channel Impedance Value: 836 Ohm
Lead Channel Pacing Threshold Amplitude: 0.5 V
Lead Channel Pacing Threshold Amplitude: 0.75 V
Lead Channel Pacing Threshold Pulse Width: 0.4 ms
Lead Channel Pacing Threshold Pulse Width: 0.4 ms
Lead Channel Sensing Intrinsic Amplitude: 1 mV
Lead Channel Sensing Intrinsic Amplitude: 13.125 mV
Lead Channel Setting Pacing Amplitude: 1.75 V
Lead Channel Setting Pacing Amplitude: 2.5 V
Lead Channel Setting Pacing Pulse Width: 0.4 ms
Lead Channel Setting Sensing Sensitivity: 0.3 mV
MDC IDC MSMT LEADCHNL LV IMPEDANCE VALUE: 589 Ohm
MDC IDC MSMT LEADCHNL RA IMPEDANCE VALUE: 399 Ohm
MDC IDC SESS DTM: 20160229171006
MDC IDC SET LEADCHNL LV PACING PULSEWIDTH: 0.4 ms
MDC IDC SET ZONE DETECTION INTERVAL: 300 ms
MDC IDC SET ZONE DETECTION INTERVAL: 350 ms
MDC IDC STAT BRADY AP VP PERCENT: 0 %
MDC IDC STAT BRADY AS VP PERCENT: 81.78 %
Zone Setting Detection Interval: 370 ms
Zone Setting Detection Interval: 450 ms

## 2015-01-15 NOTE — Progress Notes (Signed)
Remote ICD transmission.   

## 2015-01-17 DIAGNOSIS — M5416 Radiculopathy, lumbar region: Secondary | ICD-10-CM | POA: Diagnosis not present

## 2015-01-17 DIAGNOSIS — M9903 Segmental and somatic dysfunction of lumbar region: Secondary | ICD-10-CM | POA: Diagnosis not present

## 2015-01-17 DIAGNOSIS — M9905 Segmental and somatic dysfunction of pelvic region: Secondary | ICD-10-CM | POA: Diagnosis not present

## 2015-01-17 DIAGNOSIS — M9901 Segmental and somatic dysfunction of cervical region: Secondary | ICD-10-CM | POA: Diagnosis not present

## 2015-01-17 DIAGNOSIS — M542 Cervicalgia: Secondary | ICD-10-CM | POA: Diagnosis not present

## 2015-01-19 ENCOUNTER — Telehealth: Payer: Self-pay

## 2015-01-19 NOTE — Telephone Encounter (Signed)
01/19/15 Disc Received from [facility] and filed on shelf / [facility]

## 2015-02-12 ENCOUNTER — Other Ambulatory Visit (HOSPITAL_COMMUNITY): Payer: Self-pay | Admitting: Cardiology

## 2015-02-14 ENCOUNTER — Ambulatory Visit (INDEPENDENT_AMBULATORY_CARE_PROVIDER_SITE_OTHER): Payer: Medicare Other | Admitting: *Deleted

## 2015-02-14 DIAGNOSIS — Z5181 Encounter for therapeutic drug level monitoring: Secondary | ICD-10-CM

## 2015-02-14 DIAGNOSIS — Z7901 Long term (current) use of anticoagulants: Secondary | ICD-10-CM

## 2015-02-14 DIAGNOSIS — I4892 Unspecified atrial flutter: Secondary | ICD-10-CM | POA: Diagnosis not present

## 2015-02-14 DIAGNOSIS — I482 Chronic atrial fibrillation, unspecified: Secondary | ICD-10-CM

## 2015-02-14 DIAGNOSIS — I4891 Unspecified atrial fibrillation: Secondary | ICD-10-CM

## 2015-02-14 LAB — POCT INR: INR: 2.6

## 2015-02-20 ENCOUNTER — Encounter: Payer: Self-pay | Admitting: Cardiology

## 2015-02-26 ENCOUNTER — Encounter: Payer: Self-pay | Admitting: Cardiology

## 2015-02-26 ENCOUNTER — Ambulatory Visit (INDEPENDENT_AMBULATORY_CARE_PROVIDER_SITE_OTHER): Payer: Medicare Other | Admitting: Cardiology

## 2015-02-26 VITALS — BP 90/58 | HR 78 | Ht 68.0 in | Wt 185.1 lb

## 2015-02-26 DIAGNOSIS — I4891 Unspecified atrial fibrillation: Secondary | ICD-10-CM

## 2015-02-26 DIAGNOSIS — I255 Ischemic cardiomyopathy: Secondary | ICD-10-CM

## 2015-02-26 MED ORDER — CARVEDILOL PHOSPHATE ER 20 MG PO CP24: 20.0000 mg | ORAL_CAPSULE | Freq: Every day | ORAL | Status: DC

## 2015-02-26 NOTE — Patient Instructions (Addendum)
Your physician recommends that you schedule a follow-up appointment in: 4 months.  COREG CR 20mg  daily

## 2015-02-26 NOTE — Progress Notes (Signed)
HPI   The patient presents for followup of his cardiomyopathy. Since I last saw him he has not had any overt symptoms however such as PND or orthopnea. He's not having any new palpitations, presyncope or syncope. He has had no weight gain or edema suddenly. However, he has had a slow weight gain probably from eating too much.. He has had no chest pressure, neck or arm discomfort.  He has doing his walking. He has felt little lightheaded and thought this was related to the change to immediate release carvedilol.  Allergies  Allergen Reactions  . Ace Inhibitors   . Amlodipine Besylate     Numbness to lips; made legs give out  . Codeine Itching  . Corzide [Nadolol-Bendroflumethiazide]   . Diovan [Valsartan]   . Flagyl [Metronidazole]   . Norvasc [Amlodipine Besylate]     Numbness to lips; made legs give out  . Omeprazole     Shut my kidneys down  . Other     Perfumes smells; paint smells; formaldehyde smells  . Penicillins   . Shellfish Allergy   . Latex Itching and Rash    Current Outpatient Prescriptions  Medication Sig Dispense Refill  . Ascorbic Acid (VITAMIN C) POWD Takes 1/8 of a teaspoon 1-2 times a day    . bisoprolol (ZEBETA) 5 MG tablet Take 1 tablet (5 mg total) by mouth daily. 90 tablet 3  . Calcium-Magnesium 500-250 MG TABS Take 1 tablet by mouth daily.     . carvedilol (COREG) 6.25 MG tablet Take 1 tablet (6.25 mg total) by mouth 2 (two) times daily. 180 tablet 3  . co-enzyme Q-10 30 MG capsule Take 100 mg by mouth daily.     . colchicine 0.6 MG tablet Take 1 tablet (0.6 mg total) by mouth daily as needed. For gout flareups. 30 tablet 3  . DIGOX 125 MCG tablet TAKE 1 TABLET BY MOUTH EVERY OTHER DAY 45 tablet 1  . diphenhydrAMINE (BENADRYL) 25 mg capsule Take 25 mg by mouth every 6 (six) hours as needed. For allergies.    . famotidine (PEPCID) 40 MG tablet Take 1 tablet (40 mg total) by mouth 2 (two) times daily. 60 tablet 1  . fish oil-omega-3 fatty acids 1000 MG  capsule Take 2 g by mouth daily.    . furosemide (LASIX) 40 MG tablet TAKE 1 TABLET BY MOUTH daily    . hydrALAZINE (APRESOLINE) 50 MG tablet TAKE 1 TABLET BY MOUTH EVERY 8 HOURS 90 tablet 6  . JANTOVEN 5 MG tablet     . JANTOVEN 5 MG tablet TAKE AS DIRECTED BY COUMADIN CLINIC 135 tablet 0  . lisinopril (PRINIVIL,ZESTRIL) 20 MG tablet TAKE 1 TABLET BY MOUTH DAILY 90 tablet 3  . Multiple Vitamin (MULTIVITAMIN WITH MINERALS) TABS Take 1 tablet by mouth daily.    . nitroGLYCERIN (NITROSTAT) 0.4 MG SL tablet Place 0.4 mg under the tongue every 5 (five) minutes as needed. For chest pain    . ondansetron (ZOFRAN-ODT) 4 MG disintegrating tablet Take 4 mg by mouth every 4 (four) hours as needed. For nausea    . potassium chloride SA (K-DUR,KLOR-CON) 20 MEQ tablet TAKE 1 TABLET BY MOUTH EVERY DAY 90 tablet 0  . spironolactone (ALDACTONE) 25 MG tablet TAKE 1/2 TABLET BY MOUTH EVERY DAY (Patient taking differently: take one tablet daily) 45 tablet 3   No current facility-administered medications for this visit.    Past Medical History  Diagnosis Date  . Ischemic cardiomyopathy  Myoview 02/2011  EF 28%  Coronary artery disease  (total occlusion of the right coronary artery, left-to-right )  . Atrial flutter     s/p TEE guided cardioversion  . Nonsustained ventricular tachycardia   . LBBB (left bundle branch block)   . Chronic drug-induced interstitial lung disorders   . Acute on chronic renal insufficiency   . Coronary artery disease     total occlusion of the right coronary artery, left to right collaterals.  He had Cypher stenting to the circumflex in  July 2003  . Seizure disorder   . AICD (automatic cardioverter/defibrillator) present     Medtronic CRT  . Hyponatremia   . 6949-lead     replaced 02/2011  . Hyperlipemia   . Hypertension   . Fatty liver     CT  . Family hx of colon cancer     2 SISTERS  . CHF (congestive heart failure)   . Asthma   . COPD (chronic obstructive pulmonary  disease)     " very MILD "  . IWPYKDXI(338.2)     Past Surgical History  Procedure Laterality Date  . Insert / replace / remove pacemaker      AICD medtronic  . Hernia repair    . Esophagogastroduodenoscopy  09/13/2012    Procedure: ESOPHAGOGASTRODUODENOSCOPY (EGD);  Surgeon: Ladene Artist, MD,FACG;  Location: [facility];  Service: Endoscopy;  Laterality: N/A;  . Coronary angioplasty    . Right heart catheterization N/A 09/17/2012    Procedure: RIGHT HEART CATH;  Surgeon: Jolaine Artist, MD;  Location: [facility] CATH LAB;  Service: Cardiovascular;  Laterality: N/A;    ROS:  Nasal congestion.   Otherwise as stated in the HPI and negative for all other systems.  PHYSICAL EXAM BP 90/58 mmHg  Pulse 78  Ht 5\' 8"  (1.727 m)  Wt 185 lb 1.6 oz (83.961 kg)  BMI 28.15 kg/m2 GENERAL:  Well appearing NECK:  No jugular venous distention, waveform within normal limits, carotid upstroke brisk and symmetric, no bruits, no thyromegaly LUNGS:  Clear to auscultation bilaterally CHEST:  ICD pocket well healed. HEART:  PMI not displaced or sustained,S1 and S2 within normal limits, no S3, no S4, no clicks, no rubs, no murmurs ABD:  Flat, positive bowel sounds normal in frequency in pitch, no bruits, no rebound, no guarding, no midline pulsatile mass, no hepatomegaly, no splenomegaly EXT:  2 plus pulses throughout, no edema, no cyanosis no clubbing  EKG:  Atrial fibrillation with demand ventricular pacing rate 78.  02/26/2015  ASSESSMENT AND PLAN  Chronic Systolic CHF:  He's on an odd dose of combination of beta blockers.   I did increase his bisoprolol at the last visit. I checked his device today on the 29th and he is pacing now 88% which is slightly higher than previous. He's on immediate release carvedilol and thought he felt better on sustained release. He would like to him back on Coreg CR and I will write this prescription. If he remains slightly lightheaded I will plan on reducing his  bisoprolol.  Coronary Artery Disease: The patient has no new sypmtoms.  No further cardiovascular testing is indicated.  We will continue with aggressive risk reduction and meds as listed.  Hypertension:   This is being managed in the context of treating his CHF  Atrial Flutter:  The patient  tolerates anticoagulation. We will continue with the meds as listed.  Chronic Kidney Disease:   This has been stable.

## 2015-02-28 ENCOUNTER — Encounter: Payer: Self-pay | Admitting: Internal Medicine

## 2015-03-01 DIAGNOSIS — M542 Cervicalgia: Secondary | ICD-10-CM | POA: Diagnosis not present

## 2015-03-01 DIAGNOSIS — M5416 Radiculopathy, lumbar region: Secondary | ICD-10-CM | POA: Diagnosis not present

## 2015-03-01 DIAGNOSIS — M9901 Segmental and somatic dysfunction of cervical region: Secondary | ICD-10-CM | POA: Diagnosis not present

## 2015-03-01 DIAGNOSIS — M9903 Segmental and somatic dysfunction of lumbar region: Secondary | ICD-10-CM | POA: Diagnosis not present

## 2015-03-01 DIAGNOSIS — M9905 Segmental and somatic dysfunction of pelvic region: Secondary | ICD-10-CM | POA: Diagnosis not present

## 2015-03-11 ENCOUNTER — Other Ambulatory Visit (HOSPITAL_COMMUNITY): Payer: Self-pay | Admitting: Internal Medicine

## 2015-03-13 ENCOUNTER — Other Ambulatory Visit (HOSPITAL_COMMUNITY): Payer: Self-pay | Admitting: Internal Medicine

## 2015-03-19 ENCOUNTER — Encounter: Payer: Self-pay | Admitting: Internal Medicine

## 2015-03-19 ENCOUNTER — Ambulatory Visit (INDEPENDENT_AMBULATORY_CARE_PROVIDER_SITE_OTHER): Payer: Medicare Other | Admitting: Internal Medicine

## 2015-03-19 ENCOUNTER — Ambulatory Visit (INDEPENDENT_AMBULATORY_CARE_PROVIDER_SITE_OTHER): Payer: Medicare Other | Admitting: *Deleted

## 2015-03-19 VITALS — BP 86/64 | HR 65 | Ht 68.0 in | Wt 184.0 lb

## 2015-03-19 DIAGNOSIS — I4892 Unspecified atrial flutter: Secondary | ICD-10-CM

## 2015-03-19 DIAGNOSIS — Z95810 Presence of automatic (implantable) cardiac defibrillator: Secondary | ICD-10-CM | POA: Diagnosis not present

## 2015-03-19 DIAGNOSIS — I5022 Chronic systolic (congestive) heart failure: Secondary | ICD-10-CM

## 2015-03-19 DIAGNOSIS — I255 Ischemic cardiomyopathy: Secondary | ICD-10-CM

## 2015-03-19 DIAGNOSIS — I482 Chronic atrial fibrillation, unspecified: Secondary | ICD-10-CM

## 2015-03-19 DIAGNOSIS — I4891 Unspecified atrial fibrillation: Secondary | ICD-10-CM | POA: Diagnosis not present

## 2015-03-19 DIAGNOSIS — Z7901 Long term (current) use of anticoagulants: Secondary | ICD-10-CM | POA: Diagnosis not present

## 2015-03-19 DIAGNOSIS — Z4502 Encounter for adjustment and management of automatic implantable cardiac defibrillator: Secondary | ICD-10-CM | POA: Diagnosis not present

## 2015-03-19 DIAGNOSIS — I4821 Permanent atrial fibrillation: Secondary | ICD-10-CM

## 2015-03-19 DIAGNOSIS — Z5181 Encounter for therapeutic drug level monitoring: Secondary | ICD-10-CM

## 2015-03-19 LAB — BASIC METABOLIC PANEL
BUN: 31 mg/dL — AB (ref 6–23)
CO2: 24 meq/L (ref 19–32)
CREATININE: 1.64 mg/dL — AB (ref 0.40–1.50)
Calcium: 9.5 mg/dL (ref 8.4–10.5)
Chloride: 100 mEq/L (ref 96–112)
GFR: 44.31 mL/min — ABNORMAL LOW (ref >60.00)
Glucose, Bld: 101 mg/dL — ABNORMAL HIGH (ref 70–99)
Potassium: 4.9 mEq/L (ref 3.5–5.1)
Sodium: 130 mEq/L — ABNORMAL LOW (ref 135–145)

## 2015-03-19 LAB — POCT INR: INR: 3.2

## 2015-03-19 MED ORDER — MAGNESIUM OXIDE 400 MG PO TABS: 400.0000 mg | ORAL_TABLET | Freq: Every day | ORAL | Status: DC

## 2015-03-19 MED ORDER — LISINOPRIL 20 MG PO TABS: 20.0000 mg | ORAL_TABLET | Freq: Every day | ORAL | Status: DC

## 2015-03-19 NOTE — Progress Notes (Signed)
Patient Care Team: [name], MD as PCP - General (Specialist)   HPI  [name] is a 69 y.o. male Seen in followup for CRT-D implanted for congestive heart failure in the setting of ischemic heart disease. He underwent generator replacement April 2012 with replacement of his 6949 ICD lead.   He is followed by [facility] and CHF  .  Myoview April 2012 demonstrated no ischemia, inferolateral scar and an ejection fraction of 28%.   He has permanent atrial fibrillation. Rate control is adequate.  He has been followed by the heart care clinic. He was hospitalized 11/13 for heart failure requiring milrinone.    His complaints are of leg cramps at night.   Past Medical History  Diagnosis Date  . Ischemic cardiomyopathy     Myoview 02/2011  EF 28%  Coronary artery disease  (total occlusion of the right coronary artery, left-to-right )  . Atrial flutter     s/p TEE guided cardioversion  . Nonsustained ventricular tachycardia   . LBBB (left bundle branch block)   . Chronic drug-induced interstitial lung disorders   . Acute on chronic renal insufficiency   . Coronary artery disease     total occlusion of the right coronary artery, left to right collaterals.  He had Cypher stenting to the circumflex in  July 2003  . Seizure disorder   . AICD (automatic cardioverter/defibrillator) present     Medtronic CRT  . Hyponatremia   . 6949-lead     replaced 02/2011  . Hyperlipemia   . Hypertension   . Fatty liver     CT  . Family hx of colon cancer     2 SISTERS  . CHF (congestive heart failure)   . Asthma   . COPD (chronic obstructive pulmonary disease)     " very MILD "  . KXFGHWEX(937.1)     Past Surgical History  Procedure Laterality Date  . Insert / replace / remove pacemaker      AICD medtronic  . Hernia repair    . Esophagogastroduodenoscopy  09/13/2012    Procedure: ESOPHAGOGASTRODUODENOSCOPY (EGD);  Surgeon: Ladene Artist, MD,FACG;  Location: [facility];   Service: Endoscopy;  Laterality: N/A;  . Coronary angioplasty    . Right heart catheterization N/A 09/17/2012    Procedure: RIGHT HEART CATH;  Surgeon: Jolaine Artist, MD;  Location: [facility] CATH LAB;  Service: Cardiovascular;  Laterality: N/A;    Current Outpatient Prescriptions  Medication Sig Dispense Refill  . Ascorbic Acid (VITAMIN C) POWD Takes 1/8 of a teaspoon 1-2 times a day    . bisoprolol (ZEBETA) 5 MG tablet Take 1 tablet (5 mg total) by mouth daily. 90 tablet 3  . Calcium-Magnesium 500-250 MG TABS Take 1 tablet by mouth daily.     . carvedilol (COREG) 6.25 MG tablet Take 1 tablet (6.25 mg total) by mouth 2 (two) times daily. 180 tablet 3  . co-enzyme Q-10 30 MG capsule Take 100 mg by mouth daily.     . colchicine 0.6 MG tablet Take 1 tablet (0.6 mg total) by mouth daily as needed. For gout flareups. 30 tablet 3  . DIGOX 125 MCG tablet TAKE 1 TABLET BY MOUTH EVERY OTHER DAY 45 tablet 1  . diphenhydrAMINE (BENADRYL) 25 mg capsule Take 25 mg by mouth every 6 (six) hours as needed. For allergies.    . famotidine (PEPCID) 40 MG tablet Take 1 tablet (40 mg total) by mouth 2 (two)  times daily. 60 tablet 1  . fish oil-omega-3 fatty acids 1000 MG capsule Take 2 g by mouth daily.    . furosemide (LASIX) 40 MG tablet TAKE 1 TABLET BY MOUTH daily    . hydrALAZINE (APRESOLINE) 50 MG tablet TAKE 1 TABLET BY MOUTH EVERY 8 HOURS (Patient taking differently: TAKE 1 TABLET BY MOUTH 3 TIMES A DAY) 90 tablet 6  . JANTOVEN 5 MG tablet TAKE AS DIRECTED BY COUMADIN CLINIC 135 tablet 0  . lisinopril (PRINIVIL,ZESTRIL) 20 MG tablet TAKE 1 TABLET BY MOUTH DAILY 90 tablet 3  . Multiple Vitamin (MULTIVITAMIN WITH MINERALS) TABS Take 1 tablet by mouth daily.    . nitroGLYCERIN (NITROSTAT) 0.4 MG SL tablet Place 0.4 mg under the tongue every 5 (five) minutes as needed. For chest pain    . potassium chloride SA (K-DUR,KLOR-CON) 20 MEQ tablet TAKE 1 TABLET BY MOUTH EVERY DAY 90 tablet 0  . spironolactone  (ALDACTONE) 25 MG tablet TAKE 1/2 TABLET BY MOUTH EVERY DAY 45 tablet 0  . carvedilol (COREG CR) 20 MG 24 hr capsule Take 1 capsule (20 mg total) by mouth daily. 90 capsule 3  . JANTOVEN 5 MG tablet     . ondansetron (ZOFRAN-ODT) 4 MG disintegrating tablet Take 4 mg by mouth every 4 (four) hours as needed. For nausea     No current facility-administered medications for this visit.    Allergies  Allergen Reactions  . Ace Inhibitors     Unknown  . Amlodipine Besylate     Numbness to lips; made legs give out  . Codeine Itching    Itching  . Corzide [Nadolol-Bendroflumethiazide]     Unknown  . Diovan [Valsartan]     Unknown  . Flagyl [Metronidazole]     Unknown  . Norvasc [Amlodipine Besylate]     Numbness to lips; made legs give out  . Omeprazole     Shut my kidneys down  . Other     Perfumes smells; paint smells; formaldehyde smells  . Penicillins     Unknown  . Shellfish Allergy   . Latex Itching and Rash    Review of Systems negative except from HPI and PMH  Physical Exam BP 86/64 mmHg  Pulse 65  Ht 5\' 8"  (1.727 m)  Wt 184 lb (83.462 kg)  BMI 27.98 kg/m2 Well developed and well nourished in no acute distress HENT normal E scleral and icterus clear Neck Supple JVP flat; carotids brisk and full Clear to ausculation  Regular rate and rhythm, no murmurs gallops or rub Soft with active bowel sounds No clubbing cyanosis  Edema Alert and oriented, grossly normal motor and sensory function Skin Warm and little bit damp      Assessment and  Plan  Atrial fibrillation-permanent  Ischemic heart disease   Implantable defibrillator-CRT-Medtronic The patient's device was interrogated.  The information was reviewed. No changes were made in the programming.     Congestive heart failure-chronic-systolic  Leg cramps    Atrial fibrillation is permanent. Continue on anticoagulation  We have discussed alternative treatments for his leg cramps. He will go every other  day with his diuretics and will also try him on a higher dose magnesium supplements.  No ischemia  Euvolemic

## 2015-03-19 NOTE — Patient Instructions (Signed)
Medication Instructions:  Your physician has recommended you make the following change in your medication:  1) START Magnesium Oxide 400 mg daily -- take one tablet twice daily for 7 days, then decrease to once daily 2) CHANGE Furosemide to taking every other day 3) TAKE Lisinopril at bedtime   Labwork: BMET & Digoxin level  Testing/Procedures: None  Follow-Up: Remote monitoring is used to monitor your Pacemaker of ICD from home. This monitoring reduces the number of office visits required to check your device to one time per year. It allows [place] to keep an eye on the functioning of your device to ensure it is working properly. You are scheduled for a device check from home on 06/18/2015. You may send your transmission at any time that day. If you have a wireless device, the transmission will be sent automatically. After your physician reviews your transmission, you will receive a postcard with your next transmission date.  Your physician wants you to follow-up in: 1 year with Dr. Caryl Comes.  You will receive a reminder letter in the mail two months in advance. If you don't receive a letter, please call our office to schedule the follow-up appointment.  Thank you for choosing [facility]!!

## 2015-03-20 LAB — CUP PACEART INCLINIC DEVICE CHECK
Battery Voltage: 2.82 V
Brady Statistic AP VP Percent: 0 %
Brady Statistic AP VS Percent: 0 %
Brady Statistic AS VP Percent: 78.53 %
Brady Statistic RA Percent Paced: 0 %
HIGH POWER IMPEDANCE MEASURED VALUE: 65 Ohm
HighPow Impedance: 228 Ohm
HighPow Impedance: 50 Ohm
HighPow Impedance: 551 Ohm
Lead Channel Impedance Value: 456 Ohm
Lead Channel Impedance Value: 551 Ohm
Lead Channel Impedance Value: 646 Ohm
Lead Channel Impedance Value: 874 Ohm
Lead Channel Pacing Threshold Amplitude: 0.625 V
Lead Channel Pacing Threshold Amplitude: 0.625 V
Lead Channel Pacing Threshold Pulse Width: 0.4 ms
Lead Channel Sensing Intrinsic Amplitude: 1.125 mV
Lead Channel Sensing Intrinsic Amplitude: 1.25 mV
Lead Channel Sensing Intrinsic Amplitude: 15.5 mV
Lead Channel Setting Pacing Pulse Width: 0.4 ms
Lead Channel Setting Sensing Sensitivity: 0.3 mV
MDC IDC MSMT LEADCHNL LV IMPEDANCE VALUE: 399 Ohm
MDC IDC MSMT LEADCHNL LV PACING THRESHOLD AMPLITUDE: 0.75 V
MDC IDC MSMT LEADCHNL LV PACING THRESHOLD PULSEWIDTH: 0.4 ms
MDC IDC MSMT LEADCHNL RA PACING THRESHOLD PULSEWIDTH: 0.4 ms
MDC IDC MSMT LEADCHNL RV SENSING INTR AMPL: 14 mV
MDC IDC SESS DTM: 20160502134223
MDC IDC SET LEADCHNL LV PACING AMPLITUDE: 1.75 V
MDC IDC SET LEADCHNL RV PACING AMPLITUDE: 2.5 V
MDC IDC SET LEADCHNL RV PACING PULSEWIDTH: 0.4 ms
MDC IDC SET ZONE DETECTION INTERVAL: 350 ms
MDC IDC SET ZONE DETECTION INTERVAL: 370 ms
MDC IDC SET ZONE DETECTION INTERVAL: 450 ms
MDC IDC STAT BRADY AS VS PERCENT: 21.47 %
MDC IDC STAT BRADY RV PERCENT PACED: 78.53 %
Zone Setting Detection Interval: 300 ms

## 2015-03-20 LAB — DIGOXIN LEVEL: Digoxin Level: 0.3 ng/mL — ABNORMAL LOW (ref 0.8–2.0)

## 2015-03-25 ENCOUNTER — Other Ambulatory Visit: Payer: Self-pay | Admitting: Cardiology

## 2015-03-27 NOTE — Telephone Encounter (Signed)
OK for [place] to refill.  Thanks.

## 2015-04-15 ENCOUNTER — Other Ambulatory Visit (HOSPITAL_COMMUNITY): Payer: Self-pay | Admitting: Internal Medicine

## 2015-04-17 ENCOUNTER — Telehealth: Payer: Self-pay | Admitting: Cardiology

## 2015-04-17 NOTE — Telephone Encounter (Signed)
°  1. Which medications need to be refilled? Coreg CR 20mg    2. Which pharmacy is medication to be sent to? [facility] in [place]  3. Do they need a 30 day or 90 day supply? 30  4. Would they like a call back once the medication has been sent to the pharmacy? Yes he has 2 days left

## 2015-04-17 NOTE — Telephone Encounter (Signed)
LM for patient to call back   Coreg Cr 20mg  refilled as denote below: 90 capsule 3 02/26/2015   Spoke with pharmacy staff - medication rejected by his plan  PA: [phone] ID: [id number]  Routed to [facility]

## 2015-04-18 ENCOUNTER — Ambulatory Visit (INDEPENDENT_AMBULATORY_CARE_PROVIDER_SITE_OTHER): Payer: Medicare Other | Admitting: *Deleted

## 2015-04-18 DIAGNOSIS — I4891 Unspecified atrial fibrillation: Secondary | ICD-10-CM | POA: Diagnosis not present

## 2015-04-18 DIAGNOSIS — Z5181 Encounter for therapeutic drug level monitoring: Secondary | ICD-10-CM

## 2015-04-18 DIAGNOSIS — Z7901 Long term (current) use of anticoagulants: Secondary | ICD-10-CM

## 2015-04-18 DIAGNOSIS — I482 Chronic atrial fibrillation, unspecified: Secondary | ICD-10-CM

## 2015-04-18 DIAGNOSIS — I4892 Unspecified atrial flutter: Secondary | ICD-10-CM | POA: Diagnosis not present

## 2015-04-18 LAB — POCT INR: INR: 2.6

## 2015-04-19 NOTE — Telephone Encounter (Signed)
I have gotten the Coreg CR 20 mg daily approved through the pt.s health plan

## 2015-04-19 NOTE — Telephone Encounter (Signed)
[name] is calling about trying to get Coreg Cr 20mg  filled . Please call

## 2015-04-20 ENCOUNTER — Other Ambulatory Visit: Payer: Self-pay | Admitting: *Deleted

## 2015-04-22 ENCOUNTER — Other Ambulatory Visit (HOSPITAL_COMMUNITY): Payer: Self-pay | Admitting: Cardiology

## 2015-05-03 DIAGNOSIS — M9903 Segmental and somatic dysfunction of lumbar region: Secondary | ICD-10-CM | POA: Diagnosis not present

## 2015-05-03 DIAGNOSIS — M9902 Segmental and somatic dysfunction of thoracic region: Secondary | ICD-10-CM | POA: Diagnosis not present

## 2015-05-03 DIAGNOSIS — M9905 Segmental and somatic dysfunction of pelvic region: Secondary | ICD-10-CM | POA: Diagnosis not present

## 2015-05-03 DIAGNOSIS — M5416 Radiculopathy, lumbar region: Secondary | ICD-10-CM | POA: Diagnosis not present

## 2015-05-03 DIAGNOSIS — M542 Cervicalgia: Secondary | ICD-10-CM | POA: Diagnosis not present

## 2015-05-29 DIAGNOSIS — M9903 Segmental and somatic dysfunction of lumbar region: Secondary | ICD-10-CM | POA: Diagnosis not present

## 2015-05-29 DIAGNOSIS — M5416 Radiculopathy, lumbar region: Secondary | ICD-10-CM | POA: Diagnosis not present

## 2015-05-29 DIAGNOSIS — M542 Cervicalgia: Secondary | ICD-10-CM | POA: Diagnosis not present

## 2015-05-29 DIAGNOSIS — M9905 Segmental and somatic dysfunction of pelvic region: Secondary | ICD-10-CM | POA: Diagnosis not present

## 2015-05-29 DIAGNOSIS — M9902 Segmental and somatic dysfunction of thoracic region: Secondary | ICD-10-CM | POA: Diagnosis not present

## 2015-05-30 ENCOUNTER — Ambulatory Visit (INDEPENDENT_AMBULATORY_CARE_PROVIDER_SITE_OTHER): Payer: Medicare Other | Admitting: *Deleted

## 2015-05-30 DIAGNOSIS — I4892 Unspecified atrial flutter: Secondary | ICD-10-CM

## 2015-05-30 DIAGNOSIS — Z7901 Long term (current) use of anticoagulants: Secondary | ICD-10-CM | POA: Diagnosis not present

## 2015-05-30 DIAGNOSIS — I482 Chronic atrial fibrillation, unspecified: Secondary | ICD-10-CM

## 2015-05-30 DIAGNOSIS — I4891 Unspecified atrial fibrillation: Secondary | ICD-10-CM | POA: Diagnosis not present

## 2015-05-30 DIAGNOSIS — Z5181 Encounter for therapeutic drug level monitoring: Secondary | ICD-10-CM

## 2015-05-30 LAB — POCT INR: INR: 2.7

## 2015-06-10 ENCOUNTER — Other Ambulatory Visit (HOSPITAL_COMMUNITY): Payer: Self-pay | Admitting: Internal Medicine

## 2015-06-11 NOTE — Telephone Encounter (Signed)
BENSIMHON REFILL. THANK YOU FOR YOUR TIME. 

## 2015-06-12 ENCOUNTER — Other Ambulatory Visit (HOSPITAL_COMMUNITY): Payer: Self-pay | Admitting: *Deleted

## 2015-06-12 DIAGNOSIS — I5022 Chronic systolic (congestive) heart failure: Secondary | ICD-10-CM

## 2015-06-12 MED ORDER — HYDRALAZINE HCL 50 MG PO TABS: 50.0000 mg | ORAL_TABLET | Freq: Three times a day (TID) | ORAL | Status: DC

## 2015-06-13 ENCOUNTER — Other Ambulatory Visit (HOSPITAL_COMMUNITY): Payer: Self-pay | Admitting: *Deleted

## 2015-06-13 DIAGNOSIS — I5022 Chronic systolic (congestive) heart failure: Secondary | ICD-10-CM

## 2015-06-13 MED ORDER — HYDRALAZINE HCL 50 MG PO TABS: 50.0000 mg | ORAL_TABLET | Freq: Three times a day (TID) | ORAL | Status: DC

## 2015-06-18 ENCOUNTER — Ambulatory Visit (INDEPENDENT_AMBULATORY_CARE_PROVIDER_SITE_OTHER): Payer: Medicare Other | Admitting: *Deleted

## 2015-06-18 DIAGNOSIS — I5022 Chronic systolic (congestive) heart failure: Secondary | ICD-10-CM

## 2015-06-18 DIAGNOSIS — I255 Ischemic cardiomyopathy: Secondary | ICD-10-CM | POA: Diagnosis not present

## 2015-06-20 NOTE — Progress Notes (Signed)
Remote ICD transmission.   

## 2015-06-30 LAB — CUP PACEART REMOTE DEVICE CHECK
Brady Statistic RA Percent Paced: 0 %
Brady Statistic RV Percent Paced: 81.13 %
Date Time Interrogation Session: 20160801062510
HIGH POWER IMPEDANCE MEASURED VALUE: 399 Ohm
HighPow Impedance: 47 Ohm
HighPow Impedance: 59 Ohm
Lead Channel Impedance Value: 418 Ohm
Lead Channel Impedance Value: 513 Ohm
Lead Channel Impedance Value: 722 Ohm
Lead Channel Pacing Threshold Amplitude: 0.625 V
Lead Channel Pacing Threshold Amplitude: 0.625 V
Lead Channel Pacing Threshold Amplitude: 0.875 V
Lead Channel Pacing Threshold Pulse Width: 0.4 ms
Lead Channel Pacing Threshold Pulse Width: 0.4 ms
Lead Channel Sensing Intrinsic Amplitude: 12.375 mV
Lead Channel Setting Pacing Amplitude: 2 V
Lead Channel Setting Pacing Amplitude: 2.5 V
Lead Channel Setting Pacing Pulse Width: 0.4 ms
MDC IDC MSMT BATTERY VOLTAGE: 2.74 V
MDC IDC MSMT LEADCHNL LV IMPEDANCE VALUE: 361 Ohm
MDC IDC MSMT LEADCHNL LV IMPEDANCE VALUE: 551 Ohm
MDC IDC MSMT LEADCHNL RA PACING THRESHOLD PULSEWIDTH: 0.4 ms
MDC IDC MSMT LEADCHNL RA SENSING INTR AMPL: 1 mV
MDC IDC MSMT LEADCHNL RA SENSING INTR AMPL: 1 mV
MDC IDC MSMT LEADCHNL RV SENSING INTR AMPL: 12.375 mV
MDC IDC SET LEADCHNL RV PACING PULSEWIDTH: 0.4 ms
MDC IDC SET LEADCHNL RV SENSING SENSITIVITY: 0.3 mV
MDC IDC SET ZONE DETECTION INTERVAL: 450 ms
MDC IDC STAT BRADY AP VP PERCENT: 0 %
MDC IDC STAT BRADY AP VS PERCENT: 0 %
MDC IDC STAT BRADY AS VP PERCENT: 81.13 %
MDC IDC STAT BRADY AS VS PERCENT: 18.87 %
Zone Setting Detection Interval: 300 ms
Zone Setting Detection Interval: 350 ms
Zone Setting Detection Interval: 370 ms

## 2015-07-03 DIAGNOSIS — H524 Presbyopia: Secondary | ICD-10-CM | POA: Diagnosis not present

## 2015-07-03 DIAGNOSIS — H25013 Cortical age-related cataract, bilateral: Secondary | ICD-10-CM | POA: Diagnosis not present

## 2015-07-03 DIAGNOSIS — H43813 Vitreous degeneration, bilateral: Secondary | ICD-10-CM | POA: Diagnosis not present

## 2015-07-03 DIAGNOSIS — H2513 Age-related nuclear cataract, bilateral: Secondary | ICD-10-CM | POA: Diagnosis not present

## 2015-07-04 ENCOUNTER — Other Ambulatory Visit: Payer: Self-pay | Admitting: Cardiology

## 2015-07-09 ENCOUNTER — Other Ambulatory Visit (HOSPITAL_COMMUNITY): Payer: Self-pay | Admitting: Internal Medicine

## 2015-07-10 ENCOUNTER — Encounter: Payer: Self-pay | Admitting: Cardiology

## 2015-07-10 ENCOUNTER — Ambulatory Visit (INDEPENDENT_AMBULATORY_CARE_PROVIDER_SITE_OTHER): Payer: Medicare Other | Admitting: Cardiology

## 2015-07-10 VITALS — BP 108/70 | HR 81 | Ht 68.0 in | Wt 184.5 lb

## 2015-07-10 DIAGNOSIS — Z79899 Other long term (current) drug therapy: Secondary | ICD-10-CM

## 2015-07-10 DIAGNOSIS — I255 Ischemic cardiomyopathy: Secondary | ICD-10-CM | POA: Diagnosis not present

## 2015-07-10 LAB — BASIC METABOLIC PANEL
BUN: 40 mg/dL — ABNORMAL HIGH (ref 7–25)
CO2: 23 mmol/L (ref 20–31)
Calcium: 9.5 mg/dL (ref 8.6–10.3)
Chloride: 98 mmol/L (ref 98–110)
Creat: 1.61 mg/dL — ABNORMAL HIGH (ref 0.70–1.18)
Glucose, Bld: 91 mg/dL (ref 65–99)
Potassium: 5.6 mmol/L — ABNORMAL HIGH (ref 3.5–5.3)
Sodium: 133 mmol/L — ABNORMAL LOW (ref 135–146)

## 2015-07-10 MED ORDER — SPIRONOLACTONE 25 MG PO TABS: 12.5000 mg | ORAL_TABLET | Freq: Every day | ORAL | Status: DC

## 2015-07-10 NOTE — Patient Instructions (Signed)
Your physician recommends that you schedule a follow-up appointment in: 4 Months  Your physician recommends that you return for lab work in: Today BMP

## 2015-07-10 NOTE — Progress Notes (Signed)
HPI   The patient presents for followup of his cardiomyopathy. Since I last saw him he has done well and he denies PND or orthopnea. He's not having any new palpitations, presyncope or syncope. He has had no weight gain or edema.   He has had no chest pressure, neck or arm discomfort.  He has doing his walking but is finding it difficult with the heat.    Allergies  Allergen Reactions  . Ace Inhibitors     Unknown  . Amlodipine Besylate     Numbness to lips; made legs give out  . Codeine Itching    Itching  . Corzide [Nadolol-Bendroflumethiazide]     Unknown  . Diovan [Valsartan]     Unknown  . Flagyl [Metronidazole]     Unknown  . Norvasc [Amlodipine Besylate]     Numbness to lips; made legs give out  . Omeprazole     Shut my kidneys down  . Other     Perfumes smells; paint smells; formaldehyde smells  . Penicillins     Unknown  . Shellfish Allergy   . Latex Itching and Rash    Current Outpatient Prescriptions  Medication Sig Dispense Refill  . Ascorbic Acid (VITAMIN C) POWD Takes 1/8 of a teaspoon 1-2 times a day    . bisoprolol (ZEBETA) 5 MG tablet Take 1 tablet (5 mg total) by mouth daily. 90 tablet 3  . Calcium-Magnesium 500-250 MG TABS Take 1 tablet by mouth daily.     . carvedilol (COREG CR) 20 MG 24 hr capsule Take 1 capsule (20 mg total) by mouth daily. 90 capsule 3  . co-enzyme Q-10 30 MG capsule Take 100 mg by mouth daily.     . colchicine 0.6 MG tablet Take 1 tablet (0.6 mg total) by mouth daily as needed. For gout flareups. 30 tablet 3  . DIGOX 125 MCG tablet TAKE 1 TABLET BY MOUTH EVERY OTHER DAY 45 tablet 1  . diphenhydrAMINE (BENADRYL) 25 mg capsule Take 25 mg by mouth every 6 (six) hours as needed. For allergies.    . famotidine (PEPCID) 40 MG tablet Take 1 tablet (40 mg total) by mouth 2 (two) times daily. 60 tablet 1  . fish oil-omega-3 fatty acids 1000 MG capsule Take 2 g by mouth daily.    . furosemide (LASIX) 40 MG tablet Take 40 mg by mouth every  other day.    . furosemide (LASIX) 40 MG tablet TAKE 1 TABLET BY MOUTH TWICE DAILY 60 tablet 0  . hydrALAZINE (APRESOLINE) 50 MG tablet Take 1 tablet (50 mg total) by mouth 3 (three) times daily. 270 tablet 2  . JANTOVEN 5 MG tablet     . JANTOVEN 5 MG tablet TAKE 1 TO 1 AND 1/2 TABLETS BY MOUTH DAILY AS DIRECTED BY COUMADIN CLINIC 135 tablet 0  . lisinopril (PRINIVIL,ZESTRIL) 20 MG tablet Take 1 tablet (20 mg total) by mouth daily. At bedtime 90 tablet 3  . magnesium oxide (MAG-OX) 400 MG tablet Take 1 tablet (400 mg total) by mouth daily. 90 tablet 3  . Multiple Vitamin (MULTIVITAMIN WITH MINERALS) TABS Take 1 tablet by mouth daily.    . nitroGLYCERIN (NITROSTAT) 0.4 MG SL tablet Place 0.4 mg under the tongue every 5 (five) minutes as needed. For chest pain    . ondansetron (ZOFRAN-ODT) 4 MG disintegrating tablet Take 4 mg by mouth every 4 (four) hours as needed. For nausea    . potassium chloride SA (K-DUR,KLOR-CON) 20  MEQ tablet TAKE 1 TABLET BY MOUTH EVERY DAY 90 tablet 0  . spironolactone (ALDACTONE) 25 MG tablet TAKE 1/2 TABLET BY MOUTH EVERY DAY 45 tablet 0   No current facility-administered medications for this visit.    Past Medical History  Diagnosis Date  . Ischemic cardiomyopathy     Myoview 02/2011  EF 28%  Coronary artery disease  (total occlusion of the right coronary artery, left-to-right )  . Atrial flutter     s/p TEE guided cardioversion  . Nonsustained ventricular tachycardia   . LBBB (left bundle branch block)   . Chronic drug-induced interstitial lung disorders   . Acute on chronic renal insufficiency   . Coronary artery disease     total occlusion of the right coronary artery, left to right collaterals.  He had Cypher stenting to the circumflex in  July 2003  . Seizure disorder   . AICD (automatic cardioverter/defibrillator) present     Medtronic CRT  . Hyponatremia   . 6949-lead     replaced 02/2011  . Hyperlipemia   . Hypertension   . Fatty liver     CT    . Family hx of colon cancer     2 SISTERS  . CHF (congestive heart failure)   . Asthma   . COPD (chronic obstructive pulmonary disease)     " very MILD "  . YIFOYDXA(128.7)     Past Surgical History  Procedure Laterality Date  . Insert / replace / remove pacemaker      AICD medtronic  . Hernia repair    . Esophagogastroduodenoscopy  09/13/2012    Procedure: ESOPHAGOGASTRODUODENOSCOPY (EGD);  Surgeon: Ladene Artist, MD,FACG;  Location: [facility];  Service: Endoscopy;  Laterality: N/A;  . Coronary angioplasty    . Right heart catheterization N/A 09/17/2012    Procedure: RIGHT HEART CATH;  Surgeon: Jolaine Artist, MD;  Location: [facility] CATH LAB;  Service: Cardiovascular;  Laterality: N/A;    ROS:  Nasal congestion.   Otherwise as stated in the HPI and negative for all other systems.  PHYSICAL EXAM BP 108/70 mmHg  Pulse 81  Ht 5\' 8"  (1.727 m)  Wt 184 lb 8 oz (83.689 kg)  BMI 28.06 kg/m2  SpO2 96% GENERAL:  Well appearing NECK:  No jugular venous distention, waveform within normal limits, carotid upstroke brisk and symmetric, no bruits, no thyromegaly LUNGS:  Clear to auscultation bilaterally CHEST:  ICD pocket well healed. HEART:  PMI not displaced or sustained,S1 and S2 within normal limits, no S3, no S4, no clicks, no rubs, no murmurs ABD:  Flat, positive bowel sounds normal in frequency in pitch, no bruits, no rebound, no guarding, no midline pulsatile mass, no hepatomegaly, no splenomegaly EXT:  2 plus pulses throughout, no edema, no cyanosis no clubbing  ASSESSMENT AND PLAN  Chronic Systolic CHF:  He's on an odd dose of combination of beta blockers.   However, this is what works for him.  He is euvolemic and he will remain on the meds as listed.   Coronary Artery Disease: The patient has no new sypmtoms.  No further cardiovascular testing is indicated.  We will continue with aggressive risk reduction and meds as listed.  Hypertension:   This is being managed in the  context of treating his CHF  Atrial Flutter:  The patient  tolerates anticoagulation. We will continue with the meds as listed.  Chronic Kidney Disease:   This has been stable.  I will check a BMET  today.

## 2015-07-12 ENCOUNTER — Other Ambulatory Visit: Payer: Self-pay | Admitting: *Deleted

## 2015-07-12 ENCOUNTER — Telehealth: Payer: Self-pay | Admitting: Cardiology

## 2015-07-12 DIAGNOSIS — D689 Coagulation defect, unspecified: Secondary | ICD-10-CM

## 2015-07-12 DIAGNOSIS — Z79899 Other long term (current) drug therapy: Secondary | ICD-10-CM

## 2015-07-12 NOTE — Telephone Encounter (Signed)
Pt says that he is returning a call to the nurse about some lab results. Please call  Thanks

## 2015-07-13 NOTE — Telephone Encounter (Signed)
Encounter handled separately as part of lab work follow-up. Refer to result notes for BMP on 07/10/15.

## 2015-07-17 ENCOUNTER — Ambulatory Visit (INDEPENDENT_AMBULATORY_CARE_PROVIDER_SITE_OTHER): Payer: Medicare Other

## 2015-07-17 ENCOUNTER — Other Ambulatory Visit (INDEPENDENT_AMBULATORY_CARE_PROVIDER_SITE_OTHER): Payer: Medicare Other | Admitting: *Deleted

## 2015-07-17 ENCOUNTER — Telehealth: Payer: Self-pay

## 2015-07-17 DIAGNOSIS — I482 Chronic atrial fibrillation, unspecified: Secondary | ICD-10-CM

## 2015-07-17 DIAGNOSIS — I4892 Unspecified atrial flutter: Secondary | ICD-10-CM

## 2015-07-17 DIAGNOSIS — N259 Disorder resulting from impaired renal tubular function, unspecified: Secondary | ICD-10-CM

## 2015-07-17 DIAGNOSIS — I4891 Unspecified atrial fibrillation: Secondary | ICD-10-CM

## 2015-07-17 DIAGNOSIS — Z7901 Long term (current) use of anticoagulants: Secondary | ICD-10-CM | POA: Diagnosis not present

## 2015-07-17 DIAGNOSIS — Z5181 Encounter for therapeutic drug level monitoring: Secondary | ICD-10-CM

## 2015-07-17 DIAGNOSIS — I2510 Atherosclerotic heart disease of native coronary artery without angina pectoris: Secondary | ICD-10-CM | POA: Diagnosis not present

## 2015-07-17 LAB — BASIC METABOLIC PANEL
BUN: 29 mg/dL — AB (ref 6–23)
CHLORIDE: 100 meq/L (ref 96–112)
CO2: 26 mEq/L (ref 19–32)
CREATININE: 1.53 mg/dL — AB (ref 0.40–1.50)
Calcium: 9 mg/dL (ref 8.4–10.5)
GFR: 47.96 mL/min — ABNORMAL LOW (ref >60.00)
Glucose, Bld: 120 mg/dL — ABNORMAL HIGH (ref 70–99)
Potassium: 4.5 mEq/L (ref 3.5–5.1)
Sodium: 131 mEq/L — ABNORMAL LOW (ref 135–145)

## 2015-07-17 LAB — POCT INR: INR: 3.3

## 2015-07-17 NOTE — Telephone Encounter (Signed)
Pt states he saw [name] on 07/10/15, had labwork done in office.  He was told they were checking INR that day.  See orders in Epic for INR on 07/12/15, but appear to be orders only.  No INR results in Epic, doesn't even look like INR collected.  Pt states he went down to [place] and they drew.  Please advise if you have INR results on this pt.  He is insistant they were already drawn at last OV with [place] does not want [place] to check today.

## 2015-07-17 NOTE — Addendum Note (Signed)
Addended by: Eulis Foster on: 07/17/2015 11:38 AM   Modules accepted: Orders

## 2015-07-17 NOTE — Telephone Encounter (Signed)
Spoke with pt and let him know that only a BMP was drawn last week, but today a BMP and INR was done and will call back with result as soon as [name] reviewed blood work.

## 2015-07-19 DIAGNOSIS — M542 Cervicalgia: Secondary | ICD-10-CM | POA: Diagnosis not present

## 2015-07-19 DIAGNOSIS — M9905 Segmental and somatic dysfunction of pelvic region: Secondary | ICD-10-CM | POA: Diagnosis not present

## 2015-07-19 DIAGNOSIS — M9902 Segmental and somatic dysfunction of thoracic region: Secondary | ICD-10-CM | POA: Diagnosis not present

## 2015-07-19 DIAGNOSIS — M9903 Segmental and somatic dysfunction of lumbar region: Secondary | ICD-10-CM | POA: Diagnosis not present

## 2015-07-19 DIAGNOSIS — M5416 Radiculopathy, lumbar region: Secondary | ICD-10-CM | POA: Diagnosis not present

## 2015-07-26 DIAGNOSIS — M5416 Radiculopathy, lumbar region: Secondary | ICD-10-CM | POA: Diagnosis not present

## 2015-07-26 DIAGNOSIS — M9902 Segmental and somatic dysfunction of thoracic region: Secondary | ICD-10-CM | POA: Diagnosis not present

## 2015-07-26 DIAGNOSIS — M9905 Segmental and somatic dysfunction of pelvic region: Secondary | ICD-10-CM | POA: Diagnosis not present

## 2015-07-26 DIAGNOSIS — M9903 Segmental and somatic dysfunction of lumbar region: Secondary | ICD-10-CM | POA: Diagnosis not present

## 2015-07-26 DIAGNOSIS — M542 Cervicalgia: Secondary | ICD-10-CM | POA: Diagnosis not present

## 2015-07-27 ENCOUNTER — Encounter: Payer: Self-pay | Admitting: *Deleted

## 2015-07-29 ENCOUNTER — Other Ambulatory Visit: Payer: Self-pay | Admitting: Internal Medicine

## 2015-08-06 ENCOUNTER — Encounter: Payer: Self-pay | Admitting: Internal Medicine

## 2015-08-24 ENCOUNTER — Ambulatory Visit (INDEPENDENT_AMBULATORY_CARE_PROVIDER_SITE_OTHER): Payer: Medicare Other | Admitting: *Deleted

## 2015-08-24 DIAGNOSIS — I4891 Unspecified atrial fibrillation: Secondary | ICD-10-CM

## 2015-08-24 DIAGNOSIS — I482 Chronic atrial fibrillation, unspecified: Secondary | ICD-10-CM

## 2015-08-24 DIAGNOSIS — Z7901 Long term (current) use of anticoagulants: Secondary | ICD-10-CM

## 2015-08-24 DIAGNOSIS — Z5181 Encounter for therapeutic drug level monitoring: Secondary | ICD-10-CM

## 2015-08-24 DIAGNOSIS — I4892 Unspecified atrial flutter: Secondary | ICD-10-CM

## 2015-08-24 LAB — POCT INR: INR: 3.2

## 2015-09-06 ENCOUNTER — Other Ambulatory Visit: Payer: Self-pay | Admitting: Internal Medicine

## 2015-09-12 ENCOUNTER — Ambulatory Visit (INDEPENDENT_AMBULATORY_CARE_PROVIDER_SITE_OTHER): Payer: Medicare Other | Admitting: *Deleted

## 2015-09-12 DIAGNOSIS — I4892 Unspecified atrial flutter: Secondary | ICD-10-CM | POA: Diagnosis not present

## 2015-09-12 DIAGNOSIS — Z5181 Encounter for therapeutic drug level monitoring: Secondary | ICD-10-CM

## 2015-09-12 DIAGNOSIS — I4891 Unspecified atrial fibrillation: Secondary | ICD-10-CM

## 2015-09-12 DIAGNOSIS — I482 Chronic atrial fibrillation, unspecified: Secondary | ICD-10-CM

## 2015-09-12 DIAGNOSIS — Z7901 Long term (current) use of anticoagulants: Secondary | ICD-10-CM

## 2015-09-12 LAB — POCT INR: INR: 2.2

## 2015-09-18 ENCOUNTER — Ambulatory Visit (INDEPENDENT_AMBULATORY_CARE_PROVIDER_SITE_OTHER): Payer: Medicare Other | Admitting: *Deleted

## 2015-09-18 DIAGNOSIS — I5022 Chronic systolic (congestive) heart failure: Secondary | ICD-10-CM

## 2015-09-18 DIAGNOSIS — I255 Ischemic cardiomyopathy: Secondary | ICD-10-CM

## 2015-09-19 NOTE — Progress Notes (Signed)
Remote ICD transmission.   

## 2015-09-20 DIAGNOSIS — M9903 Segmental and somatic dysfunction of lumbar region: Secondary | ICD-10-CM | POA: Diagnosis not present

## 2015-09-20 DIAGNOSIS — M542 Cervicalgia: Secondary | ICD-10-CM | POA: Diagnosis not present

## 2015-09-20 DIAGNOSIS — M9902 Segmental and somatic dysfunction of thoracic region: Secondary | ICD-10-CM | POA: Diagnosis not present

## 2015-09-20 DIAGNOSIS — M5416 Radiculopathy, lumbar region: Secondary | ICD-10-CM | POA: Diagnosis not present

## 2015-09-20 DIAGNOSIS — M9905 Segmental and somatic dysfunction of pelvic region: Secondary | ICD-10-CM | POA: Diagnosis not present

## 2015-10-01 LAB — CUP PACEART REMOTE DEVICE CHECK
Brady Statistic AP VP Percent: 0 %
Brady Statistic AS VP Percent: 78.47 %
Brady Statistic AS VS Percent: 21.53 %
Brady Statistic RA Percent Paced: 0 %
HIGH POWER IMPEDANCE MEASURED VALUE: 48 Ohm
HighPow Impedance: 456 Ohm
HighPow Impedance: 57 Ohm
Implantable Lead Implant Date: 20071010
Implantable Lead Model: 4194
Implantable Lead Model: 5076
Lead Channel Impedance Value: 399 Ohm
Lead Channel Impedance Value: 513 Ohm
Lead Channel Impedance Value: 589 Ohm
Lead Channel Impedance Value: 779 Ohm
Lead Channel Pacing Threshold Amplitude: 0.625 V
Lead Channel Pacing Threshold Amplitude: 0.875 V
Lead Channel Pacing Threshold Pulse Width: 0.4 ms
Lead Channel Pacing Threshold Pulse Width: 0.4 ms
Lead Channel Sensing Intrinsic Amplitude: 1.25 mV
Lead Channel Sensing Intrinsic Amplitude: 16 mV
Lead Channel Sensing Intrinsic Amplitude: 16 mV
Lead Channel Setting Pacing Amplitude: 2.5 V
Lead Channel Setting Pacing Pulse Width: 0.4 ms
Lead Channel Setting Pacing Pulse Width: 0.4 ms
MDC IDC LEAD IMPLANT DT: 20071010
MDC IDC LEAD IMPLANT DT: 20120419
MDC IDC LEAD LOCATION: 753858
MDC IDC LEAD LOCATION: 753859
MDC IDC LEAD LOCATION: 753860
MDC IDC LEAD MODEL: 7121
MDC IDC MSMT BATTERY VOLTAGE: 2.65 V
MDC IDC MSMT LEADCHNL LV IMPEDANCE VALUE: 361 Ohm
MDC IDC MSMT LEADCHNL RA PACING THRESHOLD PULSEWIDTH: 0.4 ms
MDC IDC MSMT LEADCHNL RA SENSING INTR AMPL: 1.25 mV
MDC IDC MSMT LEADCHNL RV PACING THRESHOLD AMPLITUDE: 0.625 V
MDC IDC SESS DTM: 20161101052509
MDC IDC SET LEADCHNL LV PACING AMPLITUDE: 2 V
MDC IDC SET LEADCHNL RV SENSING SENSITIVITY: 0.3 mV
MDC IDC STAT BRADY AP VS PERCENT: 0 %
MDC IDC STAT BRADY RV PERCENT PACED: 78.47 %

## 2015-10-02 ENCOUNTER — Encounter: Payer: Self-pay | Admitting: Cardiology

## 2015-10-05 ENCOUNTER — Telehealth: Payer: Self-pay | Admitting: *Deleted

## 2015-10-05 MED ORDER — COLCHICINE 0.6 MG PO TABS: 0.6000 mg | ORAL_TABLET | Freq: Every day | ORAL | Status: DC | PRN

## 2015-10-05 NOTE — Telephone Encounter (Signed)
-----   Message from [place] sent at 10/03/2015  1:14 PM EST ----- Regarding: refill We received a refill request for Colchicine 0.6mg , take one tablet daily as needed for Gout Flare ups for this patient. I'm not sure if this is refillable by [place]. The pharmacy is [facility] on [address] in [place].  Could you handle this please.  I only work one day a week and will not be back until next Wednesday. Thanks, Journalist, newspaper

## 2015-10-05 NOTE — Telephone Encounter (Signed)
Prescription for colchicine was send into pt pharmacy.Marland Kitchen

## 2015-10-10 ENCOUNTER — Ambulatory Visit (INDEPENDENT_AMBULATORY_CARE_PROVIDER_SITE_OTHER): Payer: Medicare Other | Admitting: *Deleted

## 2015-10-10 DIAGNOSIS — I482 Chronic atrial fibrillation, unspecified: Secondary | ICD-10-CM

## 2015-10-10 DIAGNOSIS — I4892 Unspecified atrial flutter: Secondary | ICD-10-CM | POA: Diagnosis not present

## 2015-10-10 DIAGNOSIS — Z5181 Encounter for therapeutic drug level monitoring: Secondary | ICD-10-CM | POA: Diagnosis not present

## 2015-10-10 DIAGNOSIS — Z7901 Long term (current) use of anticoagulants: Secondary | ICD-10-CM | POA: Diagnosis not present

## 2015-10-10 DIAGNOSIS — I4891 Unspecified atrial fibrillation: Secondary | ICD-10-CM

## 2015-10-10 LAB — POCT INR: INR: 2.2

## 2015-10-15 ENCOUNTER — Other Ambulatory Visit (HOSPITAL_COMMUNITY): Payer: Self-pay | Admitting: *Deleted

## 2015-10-15 MED ORDER — FUROSEMIDE 40 MG PO TABS: 40.0000 mg | ORAL_TABLET | Freq: Two times a day (BID) | ORAL | Status: DC

## 2015-10-22 ENCOUNTER — Other Ambulatory Visit (HOSPITAL_COMMUNITY): Payer: Self-pay | Admitting: Cardiology

## 2015-10-22 NOTE — Telephone Encounter (Signed)
Rx has been sent to the pharmacy electronically. ° °

## 2015-10-29 ENCOUNTER — Other Ambulatory Visit: Payer: Self-pay | Admitting: Cardiology

## 2015-10-29 NOTE — Telephone Encounter (Signed)
Rx request sent to pharmacy.  

## 2015-11-01 ENCOUNTER — Encounter: Payer: Self-pay | Admitting: Cardiology

## 2015-11-01 ENCOUNTER — Ambulatory Visit (INDEPENDENT_AMBULATORY_CARE_PROVIDER_SITE_OTHER): Payer: Medicare Other | Admitting: Cardiology

## 2015-11-01 VITALS — BP 100/64 | HR 76 | Ht 68.0 in | Wt 187.0 lb

## 2015-11-01 DIAGNOSIS — I429 Cardiomyopathy, unspecified: Secondary | ICD-10-CM | POA: Diagnosis not present

## 2015-11-01 DIAGNOSIS — R0602 Shortness of breath: Secondary | ICD-10-CM

## 2015-11-01 DIAGNOSIS — I255 Ischemic cardiomyopathy: Secondary | ICD-10-CM

## 2015-11-01 DIAGNOSIS — Z79899 Other long term (current) drug therapy: Secondary | ICD-10-CM | POA: Diagnosis not present

## 2015-11-01 NOTE — Progress Notes (Signed)
HPI   The patient presents for followup of his cardiomyopathy. Since I last saw him he has done well and he denies PND or orthopnea. He's not having any new palpitations, presyncope or syncope. He has had no weight gain or edema.   He has had no chest pressure, neck or arm discomfort.  His  Wife does report that he's had some mildly increased shortness of breath with activities but is not having any overt change in symptoms. His had a slow weight gain no significant change or swelling area   Allergies  Allergen Reactions  . Ace Inhibitors     Unknown  . Amlodipine Besylate     Numbness to lips; made legs give out  . Codeine Itching    Itching  . Corzide [Nadolol-Bendroflumethiazide]     Unknown  . Diovan [Valsartan]     Unknown  . Flagyl [Metronidazole]     Unknown  . Norvasc [Amlodipine Besylate]     Numbness to lips; made legs give out  . Omeprazole     Shut my kidneys down  . Other     Perfumes smells; paint smells; formaldehyde smells  . Penicillins     Unknown  . Shellfish Allergy   . Latex Itching and Rash    Current Outpatient Prescriptions  Medication Sig Dispense Refill  . Ascorbic Acid (VITAMIN C) POWD Takes 1/8 of a teaspoon 1-2 times a day    . bisoprolol (ZEBETA) 5 MG tablet TAKE 1 TABLET BY MOUTH EVERY DAY 90 tablet 0  . Calcium-Magnesium 500-250 MG TABS Take 1 tablet by mouth daily.     . carvedilol (COREG CR) 20 MG 24 hr capsule Take 1 capsule (20 mg total) by mouth daily. 90 capsule 3  . co-enzyme Q-10 30 MG capsule Take 100 mg by mouth daily.     . colchicine 0.6 MG tablet Take 1 tablet (0.6 mg total) by mouth daily as needed. For gout flareups. 30 tablet 3  . DIGOX 125 MCG tablet TAKE 1 TABLET BY MOUTH EVERY OTHER DAY 45 tablet 1  . diphenhydrAMINE (BENADRYL) 25 mg capsule Take 25 mg by mouth every 6 (six) hours as needed. For allergies.    . famotidine (PEPCID) 40 MG tablet Take 1 tablet (40 mg total) by mouth 2 (two) times daily. 60 tablet 1  . fish  oil-omega-3 fatty acids 1000 MG capsule Take 2 g by mouth daily.    . furosemide (LASIX) 40 MG tablet Take 40 mg by mouth every other day.    . furosemide (LASIX) 40 MG tablet Take 1 tablet (40 mg total) by mouth 2 (two) times daily. 60 tablet 3  . hydrALAZINE (APRESOLINE) 50 MG tablet Take 1 tablet (50 mg total) by mouth 3 (three) times daily. 270 tablet 2  . JANTOVEN 5 MG tablet TAKE 1 TO 1 AND 1/2 TABLETS BY MOUTH DAILY AS DIRECTED BY COUMADIN CLINIC 135 tablet 0  . lisinopril (PRINIVIL,ZESTRIL) 20 MG tablet Take 1 tablet (20 mg total) by mouth daily. At bedtime 90 tablet 3  . magnesium oxide (MAG-OX) 400 MG tablet Take 1 tablet (400 mg total) by mouth daily. 90 tablet 3  . Multiple Vitamin (MULTIVITAMIN WITH MINERALS) TABS Take 1 tablet by mouth daily.    . nitroGLYCERIN (NITROSTAT) 0.4 MG SL tablet Place 0.4 mg under the tongue every 5 (five) minutes as needed. For chest pain    . ondansetron (ZOFRAN-ODT) 4 MG disintegrating tablet Take 4 mg by mouth  every 4 (four) hours as needed. For nausea    . potassium chloride SA (K-DUR,KLOR-CON) 20 MEQ tablet TAKE 1 TABLET BY MOUTH EVERY DAY 90 tablet 0  . spironolactone (ALDACTONE) 25 MG tablet Take 0.5 tablets (12.5 mg total) by mouth daily. 45 tablet 3   No current facility-administered medications for this visit.    Past Medical History  Diagnosis Date  . Ischemic cardiomyopathy     Myoview 02/2011  EF 28%  Coronary artery disease  (total occlusion of the right coronary artery, left-to-right )  . Atrial flutter     s/p TEE guided cardioversion  . Nonsustained ventricular tachycardia   . LBBB (left bundle branch block)   . Chronic drug-induced interstitial lung disorders   . Acute on chronic renal insufficiency   . Coronary artery disease     total occlusion of the right coronary artery, left to right collaterals.  He had Cypher stenting to the circumflex in  July 2003  . Seizure disorder   . AICD (automatic cardioverter/defibrillator)  present     Medtronic CRT  . Hyponatremia   . 6949-lead     replaced 02/2011  . Hyperlipemia   . Hypertension   . Fatty liver     CT  . Family hx of colon cancer     2 SISTERS  . CHF (congestive heart failure)   . Asthma   . COPD (chronic obstructive pulmonary disease)     " very MILD "  . [id number])     Past Surgical History  Procedure Laterality Date  . Insert / replace / remove pacemaker      AICD medtronic  . Hernia repair    . Esophagogastroduodenoscopy  09/13/2012    Procedure: ESOPHAGOGASTRODUODENOSCOPY (EGD);  Surgeon: Ladene Artist, MD,FACG;  Location: [facility];  Service: Endoscopy;  Laterality: N/A;  . Coronary angioplasty    . Right heart catheterization N/A 09/17/2012    Procedure: RIGHT HEART CATH;  Surgeon: Jolaine Artist, MD;  Location[facility] C[facility] LAB;  Service: Cardiovascular;  Laterality: N/A;    ROS:  Nasal congestion.   Otherwise as stated in the HPI and negative for all other systems.  PHYSICAL EXAM BP 100/64 mmHg  Pulse 76  Ht 5\' 8"  (1.727 m)  Wt 187 lb (84.823 kg)  BMI 28.44 kg/m2 GENERAL:  Well appearing NECK:  No jugular venous distention, waveform within normal limits, carotid upstroke brisk and symmetric, no bruits, no thyromegaly LUNGS:  Clear to auscultation bilaterally CHEST:  ICD pocket well healed. HEART:  PMI not displaced or sustained,S1 and S2 within normal limits, no S3, no S4, no clicks, no rubs, no murmurs ABD:  Flat, positive bowel sounds normal in frequency in pitch, no bruits, no rebound, no guarding, no midline pulsatile mass, no hepatomegaly, no splenomegaly EXT:  2 plus pulses throughout, no edema, no cyanosis no clubbing  ASSESSMENT AND PLAN  Chronic Systolic CHF:  He's on an odd dose of combination of beta blockers.   However, this is what works for him.  He is euvolemic and he will remain on the meds as listed.   He does have some  mild dyspnea so I will check a BNP level. Finally it has been a couple of years  since his echo so I will check to make sure his EF is unchanged.  Coronary Artery Disease: The patient has no new sypmtoms.  No further cardiovascular testing is indicated.  We will continue with aggressive risk reduction and meds  as listed.  Hypertension:   This is being managed in the context of treating his CHF  Atrial Flutter:  The patient  tolerates anticoagulation. We will continue with the meds as listed.  Chronic Kidney Disease:   This has been stable.  I will check a BMET today.   Hyponatremia:  He will have a follow up BMET.

## 2015-11-01 NOTE — Patient Instructions (Signed)
Your physician wants you to follow-up in: 6 Months. You will receive a reminder letter in the mail two months in advance. If you don't receive a letter, please call our office to schedule the follow-up appointment.  Your physician has requested that you have an echocardiogram. Echocardiography is a painless test that uses sound waves to create images of your heart. It provides your doctor with information about the size and shape of your heart and how well your heart's chambers and valves are working. This procedure takes approximately one hour. There are no restrictions for this procedure.  Your physician recommends that you return for lab work in: CBC, BMP, BNP

## 2015-11-06 ENCOUNTER — Ambulatory Visit: Payer: Medicare Other | Admitting: Cardiology

## 2015-11-13 DIAGNOSIS — R0602 Shortness of breath: Secondary | ICD-10-CM | POA: Diagnosis not present

## 2015-11-13 DIAGNOSIS — Z79899 Other long term (current) drug therapy: Secondary | ICD-10-CM | POA: Diagnosis not present

## 2015-11-13 LAB — CBC
HEMATOCRIT: 38.6 % — AB (ref 39.0–52.0)
Hemoglobin: 13.6 g/dL (ref 13.0–17.0)
MCH: 34.9 pg — ABNORMAL HIGH (ref 26.0–34.0)
MCHC: 35.2 g/dL (ref 30.0–36.0)
MCV: 99 fL (ref 78.0–100.0)
MPV: 10.3 fL (ref 8.6–12.4)
PLATELETS: 275 10*3/uL (ref 150–400)
RBC: 3.9 MIL/uL — AB (ref 4.22–5.81)
RDW: 14 % (ref 11.5–15.5)
WBC: 5.5 10*3/uL (ref 4.0–10.5)

## 2015-11-14 LAB — BASIC METABOLIC PANEL WITH GFR
BUN: 23 mg/dL (ref 7–25)
CO2: 23 mmol/L (ref 20–31)
Calcium: 8.9 mg/dL (ref 8.6–10.3)
Chloride: 103 mmol/L (ref 98–110)
Creat: 1.46 mg/dL — ABNORMAL HIGH (ref 0.70–1.18)
Glucose, Bld: 86 mg/dL (ref 65–99)
Potassium: 4.8 mmol/L (ref 3.5–5.3)
Sodium: 133 mmol/L — ABNORMAL LOW (ref 135–146)

## 2015-11-14 LAB — BRAIN NATRIURETIC PEPTIDE: Brain Natriuretic Peptide: 190.3 pg/mL — ABNORMAL HIGH (ref 0.0–100.0)

## 2015-11-20 ENCOUNTER — Telehealth: Payer: Self-pay | Admitting: Cardiology

## 2015-11-20 NOTE — Telephone Encounter (Signed)
Pt says he was returning a call,he did not know who called.

## 2015-11-20 NOTE — Telephone Encounter (Signed)
Returned call to patient.Recent lab results given.Copy mailed to patient.

## 2015-11-21 ENCOUNTER — Ambulatory Visit (INDEPENDENT_AMBULATORY_CARE_PROVIDER_SITE_OTHER): Payer: Medicare Other | Admitting: *Deleted

## 2015-11-21 DIAGNOSIS — I4892 Unspecified atrial flutter: Secondary | ICD-10-CM | POA: Diagnosis not present

## 2015-11-21 DIAGNOSIS — I482 Chronic atrial fibrillation, unspecified: Secondary | ICD-10-CM

## 2015-11-21 DIAGNOSIS — Z5181 Encounter for therapeutic drug level monitoring: Secondary | ICD-10-CM | POA: Diagnosis not present

## 2015-11-21 DIAGNOSIS — I4891 Unspecified atrial fibrillation: Secondary | ICD-10-CM

## 2015-11-21 DIAGNOSIS — Z7901 Long term (current) use of anticoagulants: Secondary | ICD-10-CM

## 2015-11-21 LAB — POCT INR: INR: 2.3

## 2015-11-28 ENCOUNTER — Ambulatory Visit (HOSPITAL_COMMUNITY): Payer: Medicare Other

## 2015-11-30 NOTE — Progress Notes (Signed)
Patient Care Team: [name], MD as PCP - General (Specialist)   HPI  [name] is a 70 y.o. male Seen in followup for CRT-D implanted for congestive heart failure in the setting of ischemic heart disease. He underwent generator replacement April 2012 with replacement of his 6949 ICD lead.   He is followed by [facility]   .  Myoview April 2012 demonstrated no ischemia, inferolateral scar and an ejection fraction of 28%.  Echo 2014  LVEF 25% with BAE L>R repeat echo P  He has permanent atrial fibrillation. Rate control is adequate.  He has been followed by the heart care clinic. He was hospitalized 11/13 for heart failure requiring milrinone.    His major compllaint is dizziness andhypotension Notes and echo reviewed Cr 1.46 12/16 Past Medical History  Diagnosis Date  . Ischemic cardiomyopathy     Myoview 02/2011  EF 28%  Coronary artery disease  (total occlusion of the right coronary artery, left-to-right )  . Atrial flutter ([place])     s/p TEE guided cardioversion  . Nonsustained ventricular tachycardia ([place])   . LBBB (left bundle branch block)   . Chronic drug-induced interstitial lung disorders ([place])   . Acute on chronic renal insufficiency ([facility])   . Coronary artery disease     total occlusion of the right coronary artery, left to right collaterals.  He had Cypher stenting to the circumflex in  July 2003  . Seizure disorder ([place])   . AICD (automatic cardioverter/defibrillator) present     Medtronic CRT  . Hyponatremia   . 6949-lead     replaced 02/2011  . Hyperlipemia   . Hypertension   . Fatty liver     CT  . Family hx of colon cancer     2 SISTERS  . CHF (congestive heart failure) ([place])   . Asthma   . COPD (chronic obstructive pulmonary disease) ([place])     " very MILD "  . [id number])     Past Surgical History  Procedure Laterality Date  . Insert / replace / remove pacemaker      AICD medtronic  . Hernia repair    . Esophagogastroduodenoscopy   09/13/2012    Procedure: ESOPHAGOGASTRODUODENOSCOPY (EGD);  Surgeon: Ladene Artist, MD,FACG;  Location: [facility];  Service: Endoscopy;  Laterality: N/A;  . Coronary angioplasty    . Right heart catheterization N/A 09/17/2012    Procedure: RIGHT HEART CATH;  Surgeon: Jolaine Artist, MD;  Location: [facility] CATH LAB;  Service: Cardiovascular;  Laterality: N/A;    Current Outpatient Prescriptions  Medication Sig Dispense Refill  . Ascorbic Acid (VITAMIN C) POWD Takes 1/8 of a teaspoon by mouth 1-2 times a day    . bisoprolol (ZEBETA) 5 MG tablet Take 1/2 tablet (2.5 mg) by mouth once daily    . Calcium-Magnesium 500-250 MG TABS Take 1 tablet by mouth daily.     . carvedilol (COREG CR) 20 MG 24 hr capsule Take 1 capsule (20 mg total) by mouth daily. 90 capsule 3  . co-enzyme Q-10 30 MG capsule Take 100 mg by mouth daily.     . colchicine 0.6 MG tablet Take 1 tablet (0.6 mg total) by mouth daily as needed. For gout flareups. 30 tablet 3  . DIGOX 125 MCG tablet TAKE 1 TABLET BY MOUTH EVERY OTHER DAY 45 tablet 1  . diphenhydrAMINE (BENADRYL) 25 mg capsule Take 25 mg by mouth every 6 (six) hours as needed.  For allergies.    . famotidine (PEPCID) 40 MG tablet Take 1 tablet (40 mg total) by mouth 2 (two) times daily. 60 tablet 1  . fish oil-omega-3 fatty acids 1000 MG capsule Take 2 g by mouth daily.    . furosemide (LASIX) 40 MG tablet Take 40 mg by mouth every other day.    . furosemide (LASIX) 40 MG tablet Take 1 tablet (40 mg total) by mouth 2 (two) times daily. 60 tablet 3  . JANTOVEN 5 MG tablet TAKE 1 TO 1 AND 1/2 TABLETS BY MOUTH DAILY AS DIRECTED BY COUMADIN CLINIC 135 tablet 0  . lisinopril (PRINIVIL,ZESTRIL) 20 MG tablet Take 1 tablet (20 mg total) by mouth daily. At bedtime 90 tablet 3  . magnesium oxide (MAG-OX) 400 MG tablet Take 1 tablet (400 mg total) by mouth daily. 90 tablet 3  . Multiple Vitamin (MULTIVITAMIN WITH MINERALS) TABS Take 1 tablet by mouth daily.    . nitroGLYCERIN  (NITROSTAT) 0.4 MG SL tablet Place 0.4 mg under the tongue every 5 (five) minutes as needed for chest pain. (up to 3 doses) For chest pain    . ondansetron (ZOFRAN-ODT) 4 MG disintegrating tablet Take 4 mg by mouth every 4 (four) hours as needed. For nausea    . spironolactone (ALDACTONE) 25 MG tablet Take 0.5 tablets (12.5 mg total) by mouth daily. 45 tablet 3  . hydrALAZINE (APRESOLINE) 25 MG tablet Take 1 tablet (25 mg total) by mouth 2 (two) times daily. 180 tablet 3   No current facility-administered medications for this visit.    Allergies  Allergen Reactions  . Ace Inhibitors     Unknown  . Amlodipine Besylate     Numbness to lips; made legs give out  . Codeine Itching    Itching  . Corzide [Nadolol-Bendroflumethiazide]     Unknown  . Diovan [Valsartan]     Unknown  . Flagyl [Metronidazole]     Unknown  . Norvasc [Amlodipine Besylate]     Numbness to lips; made legs give out  . Omeprazole     Shut my kidneys down  . Other     Perfumes smells; paint smells; formaldehyde smells  . Penicillins     Unknown  . Shellfish Allergy   . Latex Itching and Rash    Review of Systems negative except from HPI and PMH  Physical Exam BP 104/68 mmHg  Pulse 67  Ht 5\' 8"  (1.727 m)  Wt 188 lb (85.276 kg)  BMI 28.59 kg/m2 Well developed and well nourished in no acute distress HENT normal E scleral and icterus clear Neck Supple JVP flat; carotids brisk and full Clear to ausculation  Regular rate and rhythm, no murmurs gallops or rub Soft with active bowel sounds No clubbing cyanosis  Edema Alert and oriented, grossly normal motor and sensory function Skin Warm and little bit damp      Assessment and  Plan  Atrial fibrillation-permanent  Ischemic heart disease   Implantable defibrillator-CRT-Medtronic The patient's device was interrogated.  The information was reviewed. No changes were made in the programming.     Congestive heart failure-chronic-systolic  Orthostatic  hypotension      Atrial fibrillation is permanent. Continue on anticoagulation  Without  bleeding issue  Device approcaching ERI  Will follow monthly  Hypotension and orthostasis- problematinc  We will decrease his hydralazine 50 tid>>25 bid  Also will try and simplify betablocker regime, cutting bisoprolol in half, and when he returns to see [facility] perhaps  can stop altogther  No ischemia  Euvolemic

## 2015-12-03 ENCOUNTER — Encounter: Payer: Self-pay | Admitting: Internal Medicine

## 2015-12-03 ENCOUNTER — Ambulatory Visit (INDEPENDENT_AMBULATORY_CARE_PROVIDER_SITE_OTHER): Payer: Medicare Other | Admitting: Internal Medicine

## 2015-12-03 VITALS — BP 104/68 | HR 67 | Ht 68.0 in | Wt 188.0 lb

## 2015-12-03 DIAGNOSIS — I259 Chronic ischemic heart disease, unspecified: Secondary | ICD-10-CM

## 2015-12-03 DIAGNOSIS — I482 Chronic atrial fibrillation: Secondary | ICD-10-CM

## 2015-12-03 DIAGNOSIS — Z95810 Presence of automatic (implantable) cardiac defibrillator: Secondary | ICD-10-CM

## 2015-12-03 DIAGNOSIS — I5022 Chronic systolic (congestive) heart failure: Secondary | ICD-10-CM

## 2015-12-03 DIAGNOSIS — I4821 Permanent atrial fibrillation: Secondary | ICD-10-CM

## 2015-12-03 DIAGNOSIS — I951 Orthostatic hypotension: Secondary | ICD-10-CM | POA: Diagnosis not present

## 2015-12-03 LAB — CUP PACEART INCLINIC DEVICE CHECK
Brady Statistic AP VP Percent: 0 %
Brady Statistic AP VS Percent: 0 %
Brady Statistic AS VP Percent: 80.41 %
Date Time Interrogation Session: 20170116143751
HIGH POWER IMPEDANCE MEASURED VALUE: 418 Ohm
HIGH POWER IMPEDANCE MEASURED VALUE: 46 Ohm
HighPow Impedance: 56 Ohm
Implantable Lead Implant Date: 20071010
Implantable Lead Implant Date: 20120419
Implantable Lead Model: 4194
Implantable Lead Model: 5076
Lead Channel Impedance Value: 399 Ohm
Lead Channel Impedance Value: 399 Ohm
Lead Channel Impedance Value: 532 Ohm
Lead Channel Impedance Value: 779 Ohm
Lead Channel Pacing Threshold Amplitude: 0.75 V
Lead Channel Sensing Intrinsic Amplitude: 2 mV
MDC IDC LEAD IMPLANT DT: 20071010
MDC IDC LEAD LOCATION: 753858
MDC IDC LEAD LOCATION: 753859
MDC IDC LEAD LOCATION: 753860
MDC IDC LEAD MODEL: 7121
MDC IDC MSMT BATTERY VOLTAGE: 2.63 V
MDC IDC MSMT LEADCHNL LV IMPEDANCE VALUE: 551 Ohm
MDC IDC MSMT LEADCHNL LV PACING THRESHOLD PULSEWIDTH: 0.4 ms
MDC IDC MSMT LEADCHNL RV PACING THRESHOLD AMPLITUDE: 0.75 V
MDC IDC MSMT LEADCHNL RV PACING THRESHOLD PULSEWIDTH: 0.4 ms
MDC IDC MSMT LEADCHNL RV SENSING INTR AMPL: 15.375 mV
MDC IDC SET LEADCHNL LV PACING AMPLITUDE: 2 V
MDC IDC SET LEADCHNL LV PACING PULSEWIDTH: 0.4 ms
MDC IDC SET LEADCHNL RV PACING AMPLITUDE: 2.5 V
MDC IDC SET LEADCHNL RV PACING PULSEWIDTH: 0.4 ms
MDC IDC SET LEADCHNL RV SENSING SENSITIVITY: 0.3 mV
MDC IDC STAT BRADY AS VS PERCENT: 19.59 %
MDC IDC STAT BRADY RA PERCENT PACED: 0 %
MDC IDC STAT BRADY RV PERCENT PACED: 80.41 %

## 2015-12-03 MED ORDER — HYDRALAZINE HCL 25 MG PO TABS: 25.0000 mg | ORAL_TABLET | Freq: Two times a day (BID) | ORAL | Status: DC

## 2015-12-03 NOTE — Patient Instructions (Addendum)
Medication Instructions: 1) Decrease Zebeta (bisoprolol) to 5 mg 1/2 tablet (2.5 mg) by mouth once daily 2) Decrease hydralazine to 25 mg one tablet by mouth twice daily  Labwork: - none  Procedures/Testing: - none  Follow-Up: - Remote monitoring is used to monitor your Pacemaker of ICD from home. This monitoring reduces the number of office visits required to check your device to one time per year. It allows [place] to keep an eye on the functioning of your device to ensure it is working properly. You are scheduled for a device check from home on 12/18/15 (battery check only). You may send your transmission at any time that day. If you have a wireless device, the transmission will be sent automatically. After your physician reviews your transmission, you will receive a postcard with your next transmission date.  Any Additional Special Instructions Will Be Listed Below (If Applicable). - none

## 2015-12-06 ENCOUNTER — Other Ambulatory Visit (HOSPITAL_COMMUNITY): Payer: Medicare Other

## 2015-12-12 ENCOUNTER — Ambulatory Visit (HOSPITAL_COMMUNITY): Payer: Medicare Other | Attending: Internal Medicine

## 2015-12-12 ENCOUNTER — Other Ambulatory Visit: Payer: Self-pay

## 2015-12-12 DIAGNOSIS — I10 Essential (primary) hypertension: Secondary | ICD-10-CM | POA: Insufficient documentation

## 2015-12-12 DIAGNOSIS — I340 Nonrheumatic mitral (valve) insufficiency: Secondary | ICD-10-CM | POA: Diagnosis not present

## 2015-12-12 DIAGNOSIS — I255 Ischemic cardiomyopathy: Secondary | ICD-10-CM

## 2015-12-12 DIAGNOSIS — I071 Rheumatic tricuspid insufficiency: Secondary | ICD-10-CM | POA: Diagnosis not present

## 2015-12-12 DIAGNOSIS — I358 Other nonrheumatic aortic valve disorders: Secondary | ICD-10-CM | POA: Insufficient documentation

## 2015-12-12 DIAGNOSIS — Z8249 Family history of ischemic heart disease and other diseases of the circulatory system: Secondary | ICD-10-CM | POA: Insufficient documentation

## 2015-12-12 DIAGNOSIS — I429 Cardiomyopathy, unspecified: Secondary | ICD-10-CM | POA: Diagnosis present

## 2015-12-12 DIAGNOSIS — E785 Hyperlipidemia, unspecified: Secondary | ICD-10-CM | POA: Insufficient documentation

## 2015-12-12 DIAGNOSIS — I517 Cardiomegaly: Secondary | ICD-10-CM | POA: Diagnosis not present

## 2015-12-12 DIAGNOSIS — I351 Nonrheumatic aortic (valve) insufficiency: Secondary | ICD-10-CM | POA: Diagnosis not present

## 2015-12-18 ENCOUNTER — Ambulatory Visit (INDEPENDENT_AMBULATORY_CARE_PROVIDER_SITE_OTHER): Payer: Medicare Other | Admitting: *Deleted

## 2015-12-18 DIAGNOSIS — Z95810 Presence of automatic (implantable) cardiac defibrillator: Secondary | ICD-10-CM

## 2015-12-18 NOTE — Progress Notes (Signed)
Remote ICD transmission.   

## 2015-12-31 ENCOUNTER — Other Ambulatory Visit: Payer: Self-pay | Admitting: Cardiology

## 2015-12-31 LAB — CUP PACEART REMOTE DEVICE CHECK
Battery Voltage: 2.62 V
Brady Statistic AS VS Percent: 18.09 %
Date Time Interrogation Session: 20170131062308
HIGH POWER IMPEDANCE MEASURED VALUE: 456 Ohm
HIGH POWER IMPEDANCE MEASURED VALUE: 48 Ohm
HIGH POWER IMPEDANCE MEASURED VALUE: 61 Ohm
Implantable Lead Implant Date: 20120419
Implantable Lead Location: 753858
Implantable Lead Location: 753860
Implantable Lead Model: 4194
Implantable Lead Model: 5076
Implantable Lead Model: 7121
Lead Channel Impedance Value: 532 Ohm
Lead Channel Impedance Value: 608 Ohm
Lead Channel Pacing Threshold Amplitude: 0.625 V
Lead Channel Pacing Threshold Amplitude: 0.875 V
Lead Channel Pacing Threshold Pulse Width: 0.4 ms
Lead Channel Pacing Threshold Pulse Width: 0.4 ms
Lead Channel Sensing Intrinsic Amplitude: 18.75 mV
Lead Channel Setting Pacing Amplitude: 2 V
Lead Channel Setting Pacing Amplitude: 2.5 V
Lead Channel Setting Pacing Pulse Width: 0.4 ms
MDC IDC LEAD IMPLANT DT: 20071010
MDC IDC LEAD IMPLANT DT: 20071010
MDC IDC LEAD LOCATION: 753859
MDC IDC MSMT LEADCHNL LV IMPEDANCE VALUE: 418 Ohm
MDC IDC MSMT LEADCHNL LV IMPEDANCE VALUE: 836 Ohm
MDC IDC MSMT LEADCHNL RA IMPEDANCE VALUE: 399 Ohm
MDC IDC MSMT LEADCHNL RA PACING THRESHOLD AMPLITUDE: 0.625 V
MDC IDC MSMT LEADCHNL RA PACING THRESHOLD PULSEWIDTH: 0.4 ms
MDC IDC MSMT LEADCHNL RA SENSING INTR AMPL: 1.5 mV
MDC IDC MSMT LEADCHNL RA SENSING INTR AMPL: 1.5 mV
MDC IDC MSMT LEADCHNL RV SENSING INTR AMPL: 18.75 mV
MDC IDC SET LEADCHNL LV PACING PULSEWIDTH: 0.4 ms
MDC IDC SET LEADCHNL RV SENSING SENSITIVITY: 0.3 mV
MDC IDC STAT BRADY AP VP PERCENT: 0 %
MDC IDC STAT BRADY AP VS PERCENT: 0 %
MDC IDC STAT BRADY AS VP PERCENT: 81.91 %
MDC IDC STAT BRADY RA PERCENT PACED: 0 %
MDC IDC STAT BRADY RV PERCENT PACED: 81.91 %

## 2016-01-01 ENCOUNTER — Encounter: Payer: Self-pay | Admitting: Cardiology

## 2016-01-02 ENCOUNTER — Ambulatory Visit (INDEPENDENT_AMBULATORY_CARE_PROVIDER_SITE_OTHER): Payer: Medicare Other | Admitting: *Deleted

## 2016-01-02 DIAGNOSIS — Z7901 Long term (current) use of anticoagulants: Secondary | ICD-10-CM

## 2016-01-02 DIAGNOSIS — I4891 Unspecified atrial fibrillation: Secondary | ICD-10-CM

## 2016-01-02 DIAGNOSIS — Z5181 Encounter for therapeutic drug level monitoring: Secondary | ICD-10-CM | POA: Diagnosis not present

## 2016-01-02 DIAGNOSIS — I4892 Unspecified atrial flutter: Secondary | ICD-10-CM | POA: Diagnosis not present

## 2016-01-02 LAB — POCT INR: INR: 1.9

## 2016-01-04 ENCOUNTER — Telehealth: Payer: Self-pay | Admitting: Internal Medicine

## 2016-01-04 NOTE — Telephone Encounter (Signed)
Patient made aware that when the battery alert sounds that the device will function normally for 3 months. If the alert is heard over the weekend that he can call on Monday and we can turn the alert off in the office. He verbalizes understanding.

## 2016-01-04 NOTE — Telephone Encounter (Signed)
Pt wants to know if his pacemaker goes off over the weekend,what does he need to do?

## 2016-01-18 ENCOUNTER — Ambulatory Visit (INDEPENDENT_AMBULATORY_CARE_PROVIDER_SITE_OTHER): Payer: Medicare Other | Admitting: *Deleted

## 2016-01-18 DIAGNOSIS — Z95810 Presence of automatic (implantable) cardiac defibrillator: Secondary | ICD-10-CM

## 2016-01-18 NOTE — Progress Notes (Signed)
Remote ICD transmission.   

## 2016-01-25 LAB — CUP PACEART REMOTE DEVICE CHECK
Brady Statistic AP VS Percent: 0 %
Brady Statistic RA Percent Paced: 0 %
Brady Statistic RV Percent Paced: 81.79 %
Date Time Interrogation Session: 20170303072708
HIGH POWER IMPEDANCE MEASURED VALUE: 63 Ohm
HighPow Impedance: 475 Ohm
HighPow Impedance: 49 Ohm
Implantable Lead Implant Date: 20120419
Implantable Lead Location: 753858
Implantable Lead Location: 753859
Implantable Lead Model: 4194
Implantable Lead Model: 7121
Lead Channel Impedance Value: 361 Ohm
Lead Channel Impedance Value: 418 Ohm
Lead Channel Pacing Threshold Amplitude: 0.625 V
Lead Channel Pacing Threshold Amplitude: 0.625 V
Lead Channel Pacing Threshold Pulse Width: 0.4 ms
Lead Channel Pacing Threshold Pulse Width: 0.4 ms
Lead Channel Sensing Intrinsic Amplitude: 0.625 mV
Lead Channel Setting Pacing Amplitude: 2 V
Lead Channel Setting Sensing Sensitivity: 0.3 mV
MDC IDC LEAD IMPLANT DT: 20071010
MDC IDC LEAD IMPLANT DT: 20071010
MDC IDC LEAD LOCATION: 753860
MDC IDC MSMT BATTERY VOLTAGE: 2.62 V
MDC IDC MSMT LEADCHNL LV IMPEDANCE VALUE: 589 Ohm
MDC IDC MSMT LEADCHNL LV IMPEDANCE VALUE: 760 Ohm
MDC IDC MSMT LEADCHNL LV PACING THRESHOLD AMPLITUDE: 0.875 V
MDC IDC MSMT LEADCHNL RA SENSING INTR AMPL: 0.625 mV
MDC IDC MSMT LEADCHNL RV IMPEDANCE VALUE: 589 Ohm
MDC IDC MSMT LEADCHNL RV PACING THRESHOLD PULSEWIDTH: 0.4 ms
MDC IDC MSMT LEADCHNL RV SENSING INTR AMPL: 16.125 mV
MDC IDC MSMT LEADCHNL RV SENSING INTR AMPL: 16.125 mV
MDC IDC SET LEADCHNL LV PACING PULSEWIDTH: 0.4 ms
MDC IDC SET LEADCHNL RV PACING AMPLITUDE: 2.5 V
MDC IDC SET LEADCHNL RV PACING PULSEWIDTH: 0.4 ms
MDC IDC STAT BRADY AP VP PERCENT: 0 %
MDC IDC STAT BRADY AS VP PERCENT: 81.79 %
MDC IDC STAT BRADY AS VS PERCENT: 18.21 %

## 2016-01-25 NOTE — Progress Notes (Signed)
Remote for battery check only Not yet at [facility] although voltage 2.62 [facility] 02/18/16

## 2016-01-26 ENCOUNTER — Telehealth: Payer: Self-pay | Admitting: Internal Medicine

## 2016-01-26 NOTE — Telephone Encounter (Signed)
Called patient regarding his PPM. He said that he heard a beep around 8 pm and he was told that it should go off around 9pm. It was told to him that he should talk to Dr. Caryl Comes to set up a date for generator change as the beep signifies EOL. He was reassured. He is also having a fever these days and it was told to him that it will be best if the generator change is scheduled after his fevers have improved due to high risk of device infection.

## 2016-01-28 ENCOUNTER — Telehealth: Payer: Self-pay | Admitting: Cardiology

## 2016-01-28 NOTE — Telephone Encounter (Signed)
Spoke w/ pt and informed him that his battery has reached ERI. Informed pt that at this point has battery has approximately 3 months left. Instructed pt that the alert tone will go off at the same time for the next 14 days around the same time and that if this bothers him he can come into the office to have the alert turned but to call the office and schedule an appt w/ the device clinic. Informed pt that a scheduler will call him to schedule an appt w/ MD / PA / NP. Pt verbalized understanding.

## 2016-01-31 ENCOUNTER — Encounter: Payer: Self-pay | Admitting: Internal Medicine

## 2016-01-31 ENCOUNTER — Ambulatory Visit (INDEPENDENT_AMBULATORY_CARE_PROVIDER_SITE_OTHER): Payer: Medicare Other | Admitting: *Deleted

## 2016-01-31 DIAGNOSIS — I5022 Chronic systolic (congestive) heart failure: Secondary | ICD-10-CM

## 2016-01-31 DIAGNOSIS — I255 Ischemic cardiomyopathy: Secondary | ICD-10-CM | POA: Diagnosis not present

## 2016-01-31 DIAGNOSIS — Z95810 Presence of automatic (implantable) cardiac defibrillator: Secondary | ICD-10-CM

## 2016-01-31 LAB — CUP PACEART INCLINIC DEVICE CHECK
Brady Statistic AP VS Percent: 0 %
Brady Statistic AS VP Percent: 80.51 %
Brady Statistic AS VS Percent: 19.49 %
Brady Statistic RA Percent Paced: 0 %
Brady Statistic RV Percent Paced: 80.51 %
HIGH POWER IMPEDANCE MEASURED VALUE: 59 Ohm
HighPow Impedance: 418 Ohm
HighPow Impedance: 48 Ohm
Implantable Lead Implant Date: 20071010
Implantable Lead Location: 753859
Implantable Lead Location: 753860
Implantable Lead Model: 4194
Lead Channel Impedance Value: 399 Ohm
Lead Channel Impedance Value: 418 Ohm
Lead Channel Impedance Value: 608 Ohm
Lead Channel Impedance Value: 836 Ohm
Lead Channel Pacing Threshold Pulse Width: 0.4 ms
Lead Channel Sensing Intrinsic Amplitude: 11.875 mV
Lead Channel Setting Pacing Amplitude: 2.5 V
Lead Channel Setting Pacing Pulse Width: 0.4 ms
Lead Channel Setting Pacing Pulse Width: 0.4 ms
Lead Channel Setting Sensing Sensitivity: 0.3 mV
MDC IDC LEAD IMPLANT DT: 20071010
MDC IDC LEAD IMPLANT DT: 20120419
MDC IDC LEAD LOCATION: 753858
MDC IDC LEAD MODEL: 7121
MDC IDC MSMT BATTERY VOLTAGE: 2.62 V
MDC IDC MSMT LEADCHNL LV PACING THRESHOLD AMPLITUDE: 0.75 V
MDC IDC MSMT LEADCHNL RV IMPEDANCE VALUE: 532 Ohm
MDC IDC MSMT LEADCHNL RV PACING THRESHOLD AMPLITUDE: 0.5 V
MDC IDC MSMT LEADCHNL RV PACING THRESHOLD PULSEWIDTH: 0.4 ms
MDC IDC SESS DTM: 20170316122312
MDC IDC SET LEADCHNL LV PACING AMPLITUDE: 1.75 V
MDC IDC STAT BRADY AP VP PERCENT: 0 %

## 2016-01-31 NOTE — Progress Notes (Signed)
CRT-D device check in office. RRT reached 01/26/16. Thresholds and sensing consistent with previous device measurements. Lead impedance trends stable over time. 1 AT/AF episode recorded, 100% AF burden, +warfarin. No ventricular arrhythmia episodes recorded. Patient bi-ventricularly pacing 82.9%, VSRP 16.9% due to AF. Device programmed with appropriate safety margins. Heart failure diagnostics reviewed and trends are stable for patient. Reprogrammed auditory RRT alerts off. Patient enrolled in remote follow up. Patient education completed including shock plan. ROV with SK on 02/07/16 to discuss generator change.

## 2016-02-01 DIAGNOSIS — J309 Allergic rhinitis, unspecified: Secondary | ICD-10-CM | POA: Diagnosis not present

## 2016-02-05 ENCOUNTER — Telehealth: Payer: Self-pay | Admitting: Cardiology

## 2016-02-05 NOTE — Telephone Encounter (Signed)
-----   Message from Deboraha Sprang, MD sent at 02/01/2016  5:31 PM EDT ----- Can u schdeule pt with Renee or amber to do preop for  generator replacement

## 2016-02-05 NOTE — Telephone Encounter (Signed)
LMOVM for pt to return call. Reschedule MD appt to RU or AS.

## 2016-02-06 DIAGNOSIS — J302 Other seasonal allergic rhinitis: Secondary | ICD-10-CM | POA: Diagnosis not present

## 2016-02-07 ENCOUNTER — Encounter: Payer: Medicare Other | Admitting: Internal Medicine

## 2016-02-14 ENCOUNTER — Ambulatory Visit (INDEPENDENT_AMBULATORY_CARE_PROVIDER_SITE_OTHER): Payer: Medicare Other | Admitting: *Deleted

## 2016-02-14 ENCOUNTER — Encounter: Payer: Self-pay | Admitting: Internal Medicine

## 2016-02-14 ENCOUNTER — Ambulatory Visit (INDEPENDENT_AMBULATORY_CARE_PROVIDER_SITE_OTHER): Payer: Medicare Other | Admitting: Nurse Practitioner

## 2016-02-14 ENCOUNTER — Encounter: Payer: Self-pay | Admitting: Nurse Practitioner

## 2016-02-14 ENCOUNTER — Encounter: Payer: Self-pay | Admitting: *Deleted

## 2016-02-14 VITALS — BP 108/68 | Ht 68.0 in | Wt 182.0 lb

## 2016-02-14 DIAGNOSIS — I259 Chronic ischemic heart disease, unspecified: Secondary | ICD-10-CM

## 2016-02-14 DIAGNOSIS — Z7901 Long term (current) use of anticoagulants: Secondary | ICD-10-CM | POA: Diagnosis not present

## 2016-02-14 DIAGNOSIS — I5022 Chronic systolic (congestive) heart failure: Secondary | ICD-10-CM | POA: Diagnosis not present

## 2016-02-14 DIAGNOSIS — I4891 Unspecified atrial fibrillation: Secondary | ICD-10-CM

## 2016-02-14 DIAGNOSIS — I255 Ischemic cardiomyopathy: Secondary | ICD-10-CM

## 2016-02-14 DIAGNOSIS — I4892 Unspecified atrial flutter: Secondary | ICD-10-CM

## 2016-02-14 DIAGNOSIS — I482 Chronic atrial fibrillation: Secondary | ICD-10-CM | POA: Diagnosis not present

## 2016-02-14 DIAGNOSIS — Z5181 Encounter for therapeutic drug level monitoring: Secondary | ICD-10-CM

## 2016-02-14 DIAGNOSIS — I4821 Permanent atrial fibrillation: Secondary | ICD-10-CM

## 2016-02-14 LAB — POCT INR: INR: 1.2

## 2016-02-14 NOTE — Progress Notes (Signed)
Electrophysiology Office Note Date: 02/14/2016  ID:  [name], DOB February 14, 1946, MRN [id number]  PCP: [name], MD Primary Cardiologist: Hochrein Electrophysiologist: Caryl Comes   CC: ICD at [place] is a 70 y.o. male seen today for Dr Caryl Comes.  His ICD has been found to be at [facility] and he presents today to discuss generator change. Since last being seen in our clinic, the patient reports doing very well. He denies chest pain, palpitations, dyspnea, PND, orthopnea, nausea, vomiting, dizziness, syncope, edema, weight gain, or early satiety.  He has not had ICD shocks.   Device History: MDT CRTD implanted 2007 for ischemic cardiomyopathy; RV lead revision and gen change 2012  Past Medical History  Diagnosis Date  . Ischemic cardiomyopathy     Myoview 02/2011  EF 28%  Coronary artery disease  (total occlusion of the right coronary artery, left-to-right )  . Atrial flutter ([place])     s/p TEE guided cardioversion  . Nonsustained ventricular tachycardia ([place])   . LBBB (left bundle branch block)   . Chronic drug-induced interstitial lung disorders ([place])   . Acute on chronic renal insufficiency (HCC)   . Coronary artery disease     total occlusion of the right coronary artery, left to right collaterals.  He had Cypher stenting to the circumflex in  July 2003  . Seizure disorder ([place])   . AICD (automatic cardioverter/defibrillator) present     Medtronic CRT  . Hyponatremia   . 6949-lead     replaced 02/2011  . Hyperlipemia   . Hypertension   . Fatty liver     CT  . Family hx of colon cancer     2 SISTERS  . CHF (congestive heart failure) ([place])   . Asthma   . COPD (chronic obstructive pulmonary disease) ([place])     " very MILD "  . [id number])    Past Surgical History  Procedure Laterality Date  . Insert / replace / remove pacemaker      AICD medtronic  . Hernia repair    . Esophagogastroduodenoscopy  09/13/2012    Procedure: ESOPHAGOGASTRODUODENOSCOPY (EGD);   Surgeon: Ladene Artist, MD,FACG;  Location: [facility];  Service: Endoscopy;  Laterality: N/A;  . Coronary angioplasty    . Right heart catheterization N/A 09/17/2012    Procedure: RIGHT HEART CATH;  Surgeon: Jolaine Artist, MD;  Location: [facility] CATH LAB;  Service: Cardiovascular;  Laterality: N/A;    Current Outpatient Prescriptions  Medication Sig Dispense Refill  . Ascorbic Acid (VITAMIN C) POWD Takes 1/8 of a teaspoon by mouth 1-2 times a day    . ASTRAGALUS PO Take by mouth daily.    . bisoprolol (ZEBETA) 5 MG tablet Take 1/2 tablet (2.5 mg) by mouth once daily    . Calcium-Magnesium 500-250 MG TABS Take 1 tablet by mouth daily.     . carvedilol (COREG CR) 20 MG 24 hr capsule Take 1 capsule (20 mg total) by mouth daily. 90 capsule 3  . cetirizine (ZYRTEC) 10 MG tablet Take 10 mg by mouth daily.    Marland Kitchen co-enzyme Q-10 30 MG capsule Take 100 mg by mouth daily.     . colchicine 0.6 MG tablet Take 1 tablet (0.6 mg total) by mouth daily as needed. For gout flareups. 30 tablet 3  . dextromethorphan-guaiFENesin (MUCINEX DM) 30-600 MG 12hr tablet Take 1 tablet by mouth 2 (two) times daily as needed for cough.    Marland Kitchen DIGOX 125  MCG tablet TAKE 1 TABLET BY MOUTH EVERY OTHER DAY 45 tablet 5  . diphenhydrAMINE (BENADRYL) 25 mg capsule Take 25 mg by mouth every 6 (six) hours as needed. For allergies.    . famotidine (PEPCID) 40 MG tablet Take 1 tablet (40 mg total) by mouth 2 (two) times daily. 60 tablet 1  . fish oil-omega-3 fatty acids 1000 MG capsule Take 2 g by mouth daily.    . furosemide (LASIX) 40 MG tablet Take 40 mg by mouth every other day.    . hydrALAZINE (APRESOLINE) 25 MG tablet Take 1 tablet (25 mg total) by mouth 2 (two) times daily. 180 tablet 3  . JANTOVEN 5 MG tablet TAKE 1 TO 1 AND 1/2 TABLETS BY MOUTH DAILY AS DIRECTED BY COUMADIN CLINIC 135 tablet 0  . lisinopril (PRINIVIL,ZESTRIL) 20 MG tablet Take 1 tablet (20 mg total) by mouth daily. At bedtime 90 tablet 3  . magnesium oxide  (MAG-OX) 400 MG tablet Take 1 tablet (400 mg total) by mouth daily. 90 tablet 3  . montelukast (SINGULAIR) 10 MG tablet Take 10 mg by mouth at bedtime.   2  . Multiple Vitamin (MULTIVITAMIN WITH MINERALS) TABS Take 1 tablet by mouth daily.    . nitroGLYCERIN (NITROSTAT) 0.4 MG SL tablet Place 0.4 mg under the tongue every 5 (five) minutes as needed for chest pain. (up to 3 doses) For chest pain    . ondansetron (ZOFRAN-ODT) 4 MG disintegrating tablet Take 4 mg by mouth every 4 (four) hours as needed. For nausea    . spironolactone (ALDACTONE) 25 MG tablet Take 0.5 tablets (12.5 mg total) by mouth daily. 45 tablet 3   No current facility-administered medications for this visit.    Allergies:   Ace inhibitors; Amlodipine besylate; Codeine; Corzide; Diovan; Flagyl; Norvasc; Omeprazole; Other; Penicillins; Shellfish allergy; and Latex   Social History: Social History   Social History  . Marital Status: Married    Spouse Name: N/A  . Number of Children: 1  . Years of Education: N/A   Occupational History  . RETIRED    Social History Main Topics  . Smoking status: Never Smoker   . Smokeless tobacco: Never Used  . Alcohol Use: 0.0 oz/week     Comment: OCCASIONALLY NOT OFTEN  . Drug Use: No  . Sexual Activity: Not on file   Other Topics Concern  . Not on file   Social History Narrative    Family History: Family History  Problem Relation Age of Onset  . Heart disease Mother   . Heart disease Father   . Colon cancer Sister     BOTH SISTERS 1 DESEASED FROM COLON CANCER  . Colon cancer Sister     Review of Systems: All other systems reviewed and are otherwise negative except as noted above.   Physical Exam: VS:  BP 108/68 mmHg  Ht 5\' 8"  (1.727 m)  Wt 182 lb (82.555 kg)  BMI 27.68 kg/m2 , BMI Body mass index is 27.68 kg/(m^2).  GEN- The patient is well appearing, alert and oriented x 3 today.   HEENT: normocephalic, atraumatic; sclera clear, conjunctiva pink; hearing  intact; oropharynx clear; neck supple  Lungs- Clear to ausculation bilaterally, normal work of breathing.  No wheezes, rales, rhonchi Heart- Regular rate and rhythm (paced) GI- soft, non-tender, non-distended, bowel sounds present  Extremities- no clubbing, cyanosis, or edema  MS- no significant deformity or atrophy Skin- warm and dry, no rash or lesion; ICD pocket well healed Psych-  euthymic mood, full affect Neuro- strength and sensation are intact  ICD interrogation- reviewed in detail today,  See PACEART report  EKG:  EKG is ordered today. The ekg ordered today shows atrial fibrillation with ventricular pacing   Recent Labs: 11/13/2015: BUN 23; Creat 1.46*; Hemoglobin 13.6; Platelets 275; Potassium 4.8; Sodium 133*   Wt Readings from Last 3 Encounters:  02/14/16 182 lb (82.555 kg)  12/03/15 188 lb (85.276 kg)  11/01/15 187 lb (84.823 kg)     Other studies Reviewed: Additional studies/ records that were reviewed today include: Dr Olin Pia office notes  Assessment and Plan:  1.  Chronic systolic dysfunction euvolemic today Stable on an appropriate medical regimen CRTD at [facility].  Risks, benefits to generator change reviewed with the patient today who wishes to proceed. Will schedule with Dr Caryl Comes at the next available time. Ok to proceed as long as INR is <2.5 See Claudia Desanctis Art report No changes today  2.  Permanent atrial fibrillation V rates controlled Continue Warfarin for CHADS2VASC of 4   Current medicines are reviewed at length with the patient today.   The patient does not have concerns regarding his medicines.  The following changes were made today:  none  Labs/ tests ordered today include:  Orders Placed This Encounter  Procedures  . EKG 12-Lead     Disposition:   Follow up with Dr Caryl Comes following gen change      Signed, Chanetta Marshall, NP 02/14/2016 1:49 PM  [place] [address] [phone]  (office) [phone] (fax)

## 2016-02-14 NOTE — Patient Instructions (Addendum)
Medication Instructions:   Your physician recommends that you continue on your current medications as directed. Please refer to the Current Medication list given to you today.   If you need a refill on your cardiac medications before your next appointment, please call your pharmacy.  Labwork:  YOUR LAB WORK WILL BE COMPLETED IN THE HOSPITAL THE MORNING OF YOUR PROCEDURE    Testing/Procedures:  SEE LETTER FOR ICD GEN CHANGE ON 03/05/16..   Follow-Up:   [place]  AFTER 03/05/16..  [place]  AFTER 03/05/16..   Any Other Special Instructions Will Be Listed Below (If Applicable).

## 2016-02-15 LAB — CUP PACEART INCLINIC DEVICE CHECK
Implantable Lead Implant Date: 20120419
Implantable Lead Location: 753860
Implantable Lead Model: 5076
Implantable Lead Model: 7121
Lead Channel Setting Pacing Amplitude: 1.75 V
Lead Channel Setting Pacing Amplitude: 2.5 V
Lead Channel Setting Pacing Pulse Width: 0.4 ms
Lead Channel Setting Pacing Pulse Width: 0.4 ms
Lead Channel Setting Sensing Sensitivity: 0.3 mV
MDC IDC LEAD IMPLANT DT: 20071010
MDC IDC LEAD IMPLANT DT: 20071010
MDC IDC LEAD LOCATION: 753858
MDC IDC LEAD LOCATION: 753859
MDC IDC LEAD MODEL: 4194
MDC IDC SESS DTM: 20170331130713

## 2016-02-21 ENCOUNTER — Ambulatory Visit (INDEPENDENT_AMBULATORY_CARE_PROVIDER_SITE_OTHER): Payer: Medicare Other

## 2016-02-21 DIAGNOSIS — I4892 Unspecified atrial flutter: Secondary | ICD-10-CM | POA: Diagnosis not present

## 2016-02-21 DIAGNOSIS — Z7901 Long term (current) use of anticoagulants: Secondary | ICD-10-CM

## 2016-02-21 DIAGNOSIS — Z5181 Encounter for therapeutic drug level monitoring: Secondary | ICD-10-CM | POA: Diagnosis not present

## 2016-02-21 DIAGNOSIS — I4891 Unspecified atrial fibrillation: Secondary | ICD-10-CM

## 2016-02-21 LAB — POCT INR: INR: 2.4

## 2016-02-22 ENCOUNTER — Encounter: Payer: Medicare Other | Admitting: Physician Assistant

## 2016-02-29 ENCOUNTER — Ambulatory Visit (INDEPENDENT_AMBULATORY_CARE_PROVIDER_SITE_OTHER): Payer: Medicare Other | Admitting: *Deleted

## 2016-02-29 DIAGNOSIS — Z7901 Long term (current) use of anticoagulants: Secondary | ICD-10-CM | POA: Diagnosis not present

## 2016-02-29 DIAGNOSIS — Z5181 Encounter for therapeutic drug level monitoring: Secondary | ICD-10-CM | POA: Diagnosis not present

## 2016-02-29 DIAGNOSIS — I4892 Unspecified atrial flutter: Secondary | ICD-10-CM | POA: Diagnosis not present

## 2016-02-29 DIAGNOSIS — I4891 Unspecified atrial fibrillation: Secondary | ICD-10-CM

## 2016-02-29 LAB — POCT INR: INR: 2.1

## 2016-03-03 ENCOUNTER — Other Ambulatory Visit: Payer: Self-pay | Admitting: Cardiology

## 2016-03-05 ENCOUNTER — Encounter (HOSPITAL_COMMUNITY)
Admission: RE | Disposition: A | Discharge status: Home / Self Care | Payer: Self-pay | Source: Ambulatory Visit | Attending: Internal Medicine

## 2016-03-05 ENCOUNTER — Ambulatory Visit (HOSPITAL_COMMUNITY)
Admission: RE | Admit: 2016-03-05 | Discharge: 2016-03-05 | Disposition: A | Discharge status: Home / Self Care | Payer: Medicare Other | Source: Ambulatory Visit | Attending: Internal Medicine | Admitting: Internal Medicine

## 2016-03-05 ENCOUNTER — Encounter (HOSPITAL_COMMUNITY): Payer: Self-pay | Admitting: Internal Medicine

## 2016-03-05 DIAGNOSIS — J449 Chronic obstructive pulmonary disease, unspecified: Secondary | ICD-10-CM | POA: Diagnosis not present

## 2016-03-05 DIAGNOSIS — I255 Ischemic cardiomyopathy: Secondary | ICD-10-CM | POA: Diagnosis present

## 2016-03-05 DIAGNOSIS — E785 Hyperlipidemia, unspecified: Secondary | ICD-10-CM | POA: Diagnosis not present

## 2016-03-05 DIAGNOSIS — I5022 Chronic systolic (congestive) heart failure: Secondary | ICD-10-CM | POA: Diagnosis not present

## 2016-03-05 DIAGNOSIS — Z880 Allergy status to penicillin: Secondary | ICD-10-CM | POA: Diagnosis not present

## 2016-03-05 DIAGNOSIS — I2510 Atherosclerotic heart disease of native coronary artery without angina pectoris: Secondary | ICD-10-CM | POA: Insufficient documentation

## 2016-03-05 DIAGNOSIS — Z7901 Long term (current) use of anticoagulants: Secondary | ICD-10-CM | POA: Diagnosis not present

## 2016-03-05 DIAGNOSIS — I482 Chronic atrial fibrillation: Secondary | ICD-10-CM | POA: Insufficient documentation

## 2016-03-05 DIAGNOSIS — Z4502 Encounter for adjustment and management of automatic implantable cardiac defibrillator: Secondary | ICD-10-CM | POA: Diagnosis not present

## 2016-03-05 DIAGNOSIS — G40909 Epilepsy, unspecified, not intractable, without status epilepticus: Secondary | ICD-10-CM | POA: Diagnosis not present

## 2016-03-05 DIAGNOSIS — Z95810 Presence of automatic (implantable) cardiac defibrillator: Secondary | ICD-10-CM | POA: Diagnosis present

## 2016-03-05 DIAGNOSIS — I110 Hypertensive heart disease with heart failure: Secondary | ICD-10-CM | POA: Insufficient documentation

## 2016-03-05 HISTORY — PX: EP IMPLANTABLE DEVICE: SHX172B

## 2016-03-05 LAB — SURGICAL PCR SCREEN
MRSA, PCR: NEGATIVE
Staphylococcus aureus: NEGATIVE

## 2016-03-05 LAB — BASIC METABOLIC PANEL
Anion gap: 11 (ref 5–15)
BUN: 25 mg/dL — AB (ref 6–20)
CHLORIDE: 101 mmol/L (ref 101–111)
CO2: 21 mmol/L — AB (ref 22–32)
CREATININE: 1.46 mg/dL — AB (ref 0.61–1.24)
Calcium: 9.3 mg/dL (ref 8.9–10.3)
GFR calc Af Amer: 54 mL/min — ABNORMAL LOW (ref >60)
GFR calc non Af Amer: 47 mL/min — ABNORMAL LOW (ref >60)
Glucose, Bld: 113 mg/dL — ABNORMAL HIGH (ref 65–99)
POTASSIUM: 4.3 mmol/L (ref 3.5–5.1)
Sodium: 133 mmol/L — ABNORMAL LOW (ref 135–145)

## 2016-03-05 LAB — CBC
HEMATOCRIT: 38.9 % — AB (ref 39.0–52.0)
HEMOGLOBIN: 13.1 g/dL (ref 13.0–17.0)
MCH: 34.3 pg — AB (ref 26.0–34.0)
MCHC: 33.7 g/dL (ref 30.0–36.0)
MCV: 101.8 fL — AB (ref 78.0–100.0)
Platelets: 237 10*3/uL (ref 150–400)
RBC: 3.82 MIL/uL — AB (ref 4.22–5.81)
RDW: 14.2 % (ref 11.5–15.5)
WBC: 6.6 10*3/uL (ref 4.0–10.5)

## 2016-03-05 LAB — PROTIME-INR
INR: 2.57 — ABNORMAL HIGH (ref 0.00–1.49)
Prothrombin Time: 27.2 seconds — ABNORMAL HIGH (ref 11.6–15.2)

## 2016-03-05 SURGERY — ICD/BIV ICD GENERATOR CHANGEOUT

## 2016-03-05 MED ORDER — FENTANYL CITRATE (PF) 100 MCG/2ML IJ SOLN
INTRAMUSCULAR | Status: DC | PRN
  Administered 2016-03-05 (×2): 25 ug via INTRAVENOUS

## 2016-03-05 MED ORDER — VANCOMYCIN HCL IN DEXTROSE 1-5 GM/200ML-% IV SOLN
1000.0000 mg | INTRAVENOUS | Status: AC
  Administered 2016-03-05: 1000 mg via INTRAVENOUS

## 2016-03-05 MED ORDER — MUPIROCIN 2 % EX OINT
1.0000 "application " | TOPICAL_OINTMENT | Freq: Once | CUTANEOUS | Status: AC
  Administered 2016-03-05: 1 via TOPICAL

## 2016-03-05 MED ORDER — SODIUM CHLORIDE 0.9 % IV SOLN: INTRAVENOUS | Status: AC

## 2016-03-05 MED ORDER — FENTANYL CITRATE (PF) 100 MCG/2ML IJ SOLN
INTRAMUSCULAR | Status: AC
  Filled 2016-03-05: qty 2

## 2016-03-05 MED ORDER — CHLORHEXIDINE GLUCONATE 4 % EX LIQD: 60.0000 mL | Freq: Once | CUTANEOUS | Status: DC

## 2016-03-05 MED ORDER — GENTAMICIN SULFATE 40 MG/ML IJ SOLN
80.0000 mg | INTRAMUSCULAR | Status: AC
  Administered 2016-03-05: 80 mg

## 2016-03-05 MED ORDER — MIDAZOLAM HCL 5 MG/5ML IJ SOLN
INTRAMUSCULAR | Status: DC | PRN
  Administered 2016-03-05: 2 mg via INTRAVENOUS
  Administered 2016-03-05: 1 mg via INTRAVENOUS

## 2016-03-05 MED ORDER — VANCOMYCIN HCL IN DEXTROSE 1-5 GM/200ML-% IV SOLN
INTRAVENOUS | Status: AC
  Filled 2016-03-05: qty 200

## 2016-03-05 MED ORDER — MIDAZOLAM HCL 5 MG/5ML IJ SOLN
INTRAMUSCULAR | Status: AC
  Filled 2016-03-05: qty 5

## 2016-03-05 MED ORDER — LIDOCAINE HCL (PF) 1 % IJ SOLN
INTRAMUSCULAR | Status: DC | PRN
  Administered 2016-03-05: 26 mL via INTRADERMAL

## 2016-03-05 MED ORDER — ONDANSETRON HCL 4 MG/2ML IJ SOLN: 4.0000 mg | Freq: Four times a day (QID) | INTRAMUSCULAR | Status: DC | PRN

## 2016-03-05 MED ORDER — SODIUM CHLORIDE 0.9 % IR SOLN
Status: AC
  Filled 2016-03-05: qty 2

## 2016-03-05 MED ORDER — SODIUM CHLORIDE 0.9 % IV SOLN
INTRAVENOUS | Status: DC
  Administered 2016-03-05: 09:00:00 via INTRAVENOUS

## 2016-03-05 MED ORDER — LIDOCAINE HCL (PF) 1 % IJ SOLN
INTRAMUSCULAR | Status: AC
  Filled 2016-03-05: qty 60

## 2016-03-05 MED ORDER — ACETAMINOPHEN 325 MG PO TABS: 325.0000 mg | ORAL_TABLET | ORAL | Status: DC | PRN

## 2016-03-05 MED ORDER — MUPIROCIN 2 % EX OINT
TOPICAL_OINTMENT | CUTANEOUS | Status: AC
  Administered 2016-03-05: 1 via TOPICAL
  Filled 2016-03-05: qty 22

## 2016-03-05 SURGICAL SUPPLY — 8 items
CABLE SURGICAL S-101-97-12 (CABLE) ×3 IMPLANT
DEVICE DISSECT PLASMABLAD 3.0S (MISCELLANEOUS) ×1 IMPLANT
HEMOSTAT SURGICEL 2X4 FIBR (HEMOSTASIS) ×6 IMPLANT
ICD VIVA XT CRT-D DTBA1D1 (ICD Generator) ×3 IMPLANT
PAD DEFIB LIFELINK (PAD) ×3 IMPLANT
PLASMABLADE 3.0S (MISCELLANEOUS) ×3
POUCH AIGIS-R ANTIBACT ICD (Mesh General) ×3 IMPLANT
TRAY PACEMAKER INSERTION (PACKS) ×6 IMPLANT

## 2016-03-05 NOTE — H&P (View-Only) (Signed)
Electrophysiology Office Note Date: 02/14/2016  ID:  [name], DOB 10/03/1946, MRN [id number]  PCP: [name], MD Primary Cardiologist: Hochrein Electrophysiologist: Caryl Comes   CC: ICD at [place] is a 70 y.o. male seen today for Dr Caryl Comes.  His ICD has been found to be at [facility] and he presents today to discuss generator change. Since last being seen in our clinic, the patient reports doing very well. He denies chest pain, palpitations, dyspnea, PND, orthopnea, nausea, vomiting, dizziness, syncope, edema, weight gain, or early satiety.  He has not had ICD shocks.   Device History: MDT CRTD implanted 2007 for ischemic cardiomyopathy; RV lead revision and gen change 2012  Past Medical History  Diagnosis Date  . Ischemic cardiomyopathy     Myoview 02/2011  EF 28%  Coronary artery disease  (total occlusion of the right coronary artery, left-to-right )  . Atrial flutter ([place])     s/p TEE guided cardioversion  . Nonsustained ventricular tachycardia ([place])   . LBBB (left bundle branch block)   . Chronic drug-induced interstitial lung disorders ([place])   . Acute on chronic renal insufficiency (HCC)   . Coronary artery disease     total occlusion of the right coronary artery, left to right collaterals.  He had Cypher stenting to the circumflex in  July 2003  . Seizure disorder ([place])   . AICD (automatic cardioverter/defibrillator) present     Medtronic CRT  . Hyponatremia   . 6949-lead     replaced 02/2011  . Hyperlipemia   . Hypertension   . Fatty liver     CT  . Family hx of colon cancer     2 SISTERS  . CHF (congestive heart failure) ([place])   . Asthma   . COPD (chronic obstructive pulmonary disease) ([place])     " very MILD "  . [id number])    Past Surgical History  Procedure Laterality Date  . Insert / replace / remove pacemaker      AICD medtronic  . Hernia repair    . Esophagogastroduodenoscopy  09/13/2012    Procedure: ESOPHAGOGASTRODUODENOSCOPY (EGD);   Surgeon: Ladene Artist, MD,FACG;  Location: [facility];  Service: Endoscopy;  Laterality: N/A;  . Coronary angioplasty    . Right heart catheterization N/A 09/17/2012    Procedure: RIGHT HEART CATH;  Surgeon: Jolaine Artist, MD;  Location: [facility] CATH LAB;  Service: Cardiovascular;  Laterality: N/A;    Current Outpatient Prescriptions  Medication Sig Dispense Refill  . Ascorbic Acid (VITAMIN C) POWD Takes 1/8 of a teaspoon by mouth 1-2 times a day    . ASTRAGALUS PO Take by mouth daily.    . bisoprolol (ZEBETA) 5 MG tablet Take 1/2 tablet (2.5 mg) by mouth once daily    . Calcium-Magnesium 500-250 MG TABS Take 1 tablet by mouth daily.     . carvedilol (COREG CR) 20 MG 24 hr capsule Take 1 capsule (20 mg total) by mouth daily. 90 capsule 3  . cetirizine (ZYRTEC) 10 MG tablet Take 10 mg by mouth daily.    Marland Kitchen co-enzyme Q-10 30 MG capsule Take 100 mg by mouth daily.     . colchicine 0.6 MG tablet Take 1 tablet (0.6 mg total) by mouth daily as needed. For gout flareups. 30 tablet 3  . dextromethorphan-guaiFENesin (MUCINEX DM) 30-600 MG 12hr tablet Take 1 tablet by mouth 2 (two) times daily as needed for cough.    Marland Kitchen DIGOX 125  MCG tablet TAKE 1 TABLET BY MOUTH EVERY OTHER DAY 45 tablet 5  . diphenhydrAMINE (BENADRYL) 25 mg capsule Take 25 mg by mouth every 6 (six) hours as needed. For allergies.    . famotidine (PEPCID) 40 MG tablet Take 1 tablet (40 mg total) by mouth 2 (two) times daily. 60 tablet 1  . fish oil-omega-3 fatty acids 1000 MG capsule Take 2 g by mouth daily.    . furosemide (LASIX) 40 MG tablet Take 40 mg by mouth every other day.    . hydrALAZINE (APRESOLINE) 25 MG tablet Take 1 tablet (25 mg total) by mouth 2 (two) times daily. 180 tablet 3  . JANTOVEN 5 MG tablet TAKE 1 TO 1 AND 1/2 TABLETS BY MOUTH DAILY AS DIRECTED BY COUMADIN CLINIC 135 tablet 0  . lisinopril (PRINIVIL,ZESTRIL) 20 MG tablet Take 1 tablet (20 mg total) by mouth daily. At bedtime 90 tablet 3  . magnesium oxide  (MAG-OX) 400 MG tablet Take 1 tablet (400 mg total) by mouth daily. 90 tablet 3  . montelukast (SINGULAIR) 10 MG tablet Take 10 mg by mouth at bedtime.   2  . Multiple Vitamin (MULTIVITAMIN WITH MINERALS) TABS Take 1 tablet by mouth daily.    . nitroGLYCERIN (NITROSTAT) 0.4 MG SL tablet Place 0.4 mg under the tongue every 5 (five) minutes as needed for chest pain. (up to 3 doses) For chest pain    . ondansetron (ZOFRAN-ODT) 4 MG disintegrating tablet Take 4 mg by mouth every 4 (four) hours as needed. For nausea    . spironolactone (ALDACTONE) 25 MG tablet Take 0.5 tablets (12.5 mg total) by mouth daily. 45 tablet 3   No current facility-administered medications for this visit.    Allergies:   Ace inhibitors; Amlodipine besylate; Codeine; Corzide; Diovan; Flagyl; Norvasc; Omeprazole; Other; Penicillins; Shellfish allergy; and Latex   Social History: Social History   Social History  . Marital Status: Married    Spouse Name: N/A  . Number of Children: 1  . Years of Education: N/A   Occupational History  . RETIRED    Social History Main Topics  . Smoking status: Never Smoker   . Smokeless tobacco: Never Used  . Alcohol Use: 0.0 oz/week     Comment: OCCASIONALLY NOT OFTEN  . Drug Use: No  . Sexual Activity: Not on file   Other Topics Concern  . Not on file   Social History Narrative    Family History: Family History  Problem Relation Age of Onset  . Heart disease Mother   . Heart disease Father   . Colon cancer Sister     BOTH SISTERS 1 DESEASED FROM COLON CANCER  . Colon cancer Sister     Review of Systems: All other systems reviewed and are otherwise negative except as noted above.   Physical Exam: VS:  BP 108/68 mmHg  Ht 5\' 8"  (1.727 m)  Wt 182 lb (82.555 kg)  BMI 27.68 kg/m2 , BMI Body mass index is 27.68 kg/(m^2).  GEN- The patient is well appearing, alert and oriented x 3 today.   HEENT: normocephalic, atraumatic; sclera clear, conjunctiva pink; hearing  intact; oropharynx clear; neck supple  Lungs- Clear to ausculation bilaterally, normal work of breathing.  No wheezes, rales, rhonchi Heart- Regular rate and rhythm (paced) GI- soft, non-tender, non-distended, bowel sounds present  Extremities- no clubbing, cyanosis, or edema  MS- no significant deformity or atrophy Skin- warm and dry, no rash or lesion; ICD pocket well healed Psych-  euthymic mood, full affect Neuro- strength and sensation are intact  ICD interrogation- reviewed in detail today,  See PACEART report  EKG:  EKG is ordered today. The ekg ordered today shows atrial fibrillation with ventricular pacing   Recent Labs: 11/13/2015: BUN 23; Creat 1.46*; Hemoglobin 13.6; Platelets 275; Potassium 4.8; Sodium 133*   Wt Readings from Last 3 Encounters:  02/14/16 182 lb (82.555 kg)  12/03/15 188 lb (85.276 kg)  11/01/15 187 lb (84.823 kg)     Other studies Reviewed: Additional studies/ records that were reviewed today include: Dr Olin Pia office notes  Assessment and Plan:  1.  Chronic systolic dysfunction euvolemic today Stable on an appropriate medical regimen CRTD at [facility].  Risks, benefits to generator change reviewed with the patient today who wishes to proceed. Will schedule with Dr Caryl Comes at the next available time. Ok to proceed as long as INR is <2.5 See Claudia Desanctis Art report No changes today  2.  Permanent atrial fibrillation V rates controlled Continue Warfarin for CHADS2VASC of 4   Current medicines are reviewed at length with the patient today.   The patient does not have concerns regarding his medicines.  The following changes were made today:  none  Labs/ tests ordered today include:  Orders Placed This Encounter  Procedures  . EKG 12-Lead     Disposition:   Follow up with Dr Caryl Comes following gen change      Signed, Chanetta Marshall, NP 02/14/2016 1:49 PM  [place] [address] [phone]  (office) [phone] (fax)

## 2016-03-05 NOTE — Discharge Instructions (Signed)

## 2016-03-05 NOTE — Interval H&P Note (Signed)
ICD Criteria  Current LVEF:35%. Within 12 months prior to implant: Yes   Heart failure history: Yes, Class II  Cardiomyopathy history: Yes, Ischemic Cardiomyopathy.  Atrial Fibrillation/Atrial Flutter: Yes, Persistent (> 7 Days).  Ventricular tachycardia history: Yes, No hemodynamic instability. VT Type: Sustained Ventricular Tachycardia - Monomorphic.  Cardiac arrest history: No.  History of syndromes with risk of sudden death: No.  Previous ICD: Yes, Reason for ICD:  Primary prevention.  Current ICD indication: Secondary  PPM indication: No.   Class I or II Bradycardia indication present: No  Beta Blocker therapy for 3 or more months: Yes, prescribed.   Ace Inhibitor/ARB therapy for 3 or more months: Yes, prescribed.   History and Physical Interval Note:  03/05/2016 8:42 AM  [name]  has presented today for surgery, with the diagnosis of RRT  The various methods of treatment have been discussed with the patient and family. After consideration of risks, benefits and other options for treatment, the patient has consented to  Procedure(s): ICD  [employer] (N/A) as a surgical intervention .  The patient's history has been reviewed, patient examined, no change in status, stable for surgery.  I have reviewed the patient's chart and labs.  Questions were answered to the patient's satisfaction.     [name]

## 2016-03-06 MED FILL — Gentamicin Sulfate Inj 40 MG/ML: INTRAMUSCULAR | Qty: 2 | Status: AC

## 2016-03-06 MED FILL — Sodium Chloride Irrigation Soln 0.9%: Qty: 500 | Status: AC

## 2016-03-19 ENCOUNTER — Ambulatory Visit (INDEPENDENT_AMBULATORY_CARE_PROVIDER_SITE_OTHER): Payer: Medicare Other | Admitting: *Deleted

## 2016-03-19 ENCOUNTER — Ambulatory Visit: Payer: Medicare Other

## 2016-03-19 ENCOUNTER — Encounter: Payer: Self-pay | Admitting: Internal Medicine

## 2016-03-19 DIAGNOSIS — I4891 Unspecified atrial fibrillation: Secondary | ICD-10-CM

## 2016-03-19 DIAGNOSIS — I4892 Unspecified atrial flutter: Secondary | ICD-10-CM | POA: Diagnosis not present

## 2016-03-19 DIAGNOSIS — I482 Chronic atrial fibrillation, unspecified: Secondary | ICD-10-CM

## 2016-03-19 DIAGNOSIS — Z7901 Long term (current) use of anticoagulants: Secondary | ICD-10-CM | POA: Diagnosis not present

## 2016-03-19 DIAGNOSIS — Z5181 Encounter for therapeutic drug level monitoring: Secondary | ICD-10-CM

## 2016-03-19 DIAGNOSIS — I5022 Chronic systolic (congestive) heart failure: Secondary | ICD-10-CM

## 2016-03-19 DIAGNOSIS — I429 Cardiomyopathy, unspecified: Secondary | ICD-10-CM | POA: Diagnosis not present

## 2016-03-19 LAB — CUP PACEART INCLINIC DEVICE CHECK
Battery Voltage: 3.14 V
Brady Statistic AP VP Percent: 0 %
Brady Statistic AP VS Percent: 0 %
Brady Statistic AS VP Percent: 78.4 %
Brady Statistic RA Percent Paced: 0 %
Brady Statistic RV Percent Paced: 81.03 %
Date Time Interrogation Session: 20170503094438
HIGH POWER IMPEDANCE MEASURED VALUE: 46 Ohm
HIGH POWER IMPEDANCE MEASURED VALUE: 61 Ohm
Implantable Lead Implant Date: 20071010
Implantable Lead Implant Date: 20120419
Implantable Lead Location: 753859
Implantable Lead Location: 753860
Implantable Lead Model: 5076
Implantable Lead Model: 7121
Lead Channel Impedance Value: 361 Ohm
Lead Channel Impedance Value: 418 Ohm
Lead Channel Impedance Value: 456 Ohm
Lead Channel Impedance Value: 608 Ohm
Lead Channel Impedance Value: 817 Ohm
Lead Channel Pacing Threshold Pulse Width: 0.4 ms
Lead Channel Sensing Intrinsic Amplitude: 0.875 mV
Lead Channel Sensing Intrinsic Amplitude: 18.25 mV
Lead Channel Setting Sensing Sensitivity: 0.3 mV
MDC IDC LEAD IMPLANT DT: 20071010
MDC IDC LEAD LOCATION: 753858
MDC IDC LEAD MODEL: 4194
MDC IDC MSMT BATTERY REMAINING LONGEVITY: 108 mo
MDC IDC MSMT LEADCHNL LV PACING THRESHOLD AMPLITUDE: 0.75 V
MDC IDC MSMT LEADCHNL RV IMPEDANCE VALUE: 551 Ohm
MDC IDC MSMT LEADCHNL RV PACING THRESHOLD AMPLITUDE: 0.5 V
MDC IDC MSMT LEADCHNL RV PACING THRESHOLD PULSEWIDTH: 0.4 ms
MDC IDC SET LEADCHNL LV PACING AMPLITUDE: 2 V
MDC IDC SET LEADCHNL LV PACING PULSEWIDTH: 0.4 ms
MDC IDC SET LEADCHNL RV PACING AMPLITUDE: 2.5 V
MDC IDC SET LEADCHNL RV PACING PULSEWIDTH: 0.4 ms
MDC IDC STAT BRADY AS VS PERCENT: 21.6 %

## 2016-03-19 LAB — POCT INR: INR: 3.2

## 2016-03-19 NOTE — Progress Notes (Signed)
Wound check appointment s/p generator replacement 03/05/16. Dermabond removed from incision. Wound without redness or edema. Incision edges approximated, wound well healed. Normal device function. Thresholds, sensing, and impedances consistent with implant measurements. Histogram distribution appropriate for patient and level of activity. 81% BiV pacing, 18.7% VSR pacing. 100% AT/AF +warfarin. No ventricular arrhythmias noted. Patient educated about wound care, arm mobility, lifting restrictions, shock plan. ROV with SK 06/16/16.

## 2016-04-04 ENCOUNTER — Other Ambulatory Visit: Payer: Self-pay | Admitting: Cardiology

## 2016-04-04 NOTE — Telephone Encounter (Signed)
Rx(s) sent to pharmacy electronically.  

## 2016-04-09 ENCOUNTER — Ambulatory Visit (INDEPENDENT_AMBULATORY_CARE_PROVIDER_SITE_OTHER): Payer: Medicare Other | Admitting: Pharmacist

## 2016-04-09 DIAGNOSIS — I4891 Unspecified atrial fibrillation: Secondary | ICD-10-CM

## 2016-04-09 DIAGNOSIS — Z7901 Long term (current) use of anticoagulants: Secondary | ICD-10-CM

## 2016-04-09 DIAGNOSIS — Z5181 Encounter for therapeutic drug level monitoring: Secondary | ICD-10-CM | POA: Diagnosis not present

## 2016-04-09 DIAGNOSIS — I4892 Unspecified atrial flutter: Secondary | ICD-10-CM

## 2016-04-09 LAB — POCT INR: INR: 2.7

## 2016-04-14 ENCOUNTER — Other Ambulatory Visit: Payer: Self-pay | Admitting: Cardiology

## 2016-05-05 ENCOUNTER — Ambulatory Visit (INDEPENDENT_AMBULATORY_CARE_PROVIDER_SITE_OTHER): Payer: Medicare Other | Admitting: *Deleted

## 2016-05-05 DIAGNOSIS — Z7901 Long term (current) use of anticoagulants: Secondary | ICD-10-CM

## 2016-05-05 DIAGNOSIS — I4891 Unspecified atrial fibrillation: Secondary | ICD-10-CM | POA: Diagnosis not present

## 2016-05-05 DIAGNOSIS — I4892 Unspecified atrial flutter: Secondary | ICD-10-CM | POA: Diagnosis not present

## 2016-05-05 DIAGNOSIS — Z5181 Encounter for therapeutic drug level monitoring: Secondary | ICD-10-CM | POA: Diagnosis not present

## 2016-05-05 LAB — POCT INR: INR: 3.3

## 2016-05-30 ENCOUNTER — Other Ambulatory Visit (HOSPITAL_COMMUNITY): Payer: Self-pay | Admitting: Internal Medicine

## 2016-06-02 ENCOUNTER — Ambulatory Visit (INDEPENDENT_AMBULATORY_CARE_PROVIDER_SITE_OTHER): Payer: Medicare Other

## 2016-06-02 DIAGNOSIS — Z5181 Encounter for therapeutic drug level monitoring: Secondary | ICD-10-CM

## 2016-06-02 DIAGNOSIS — I4892 Unspecified atrial flutter: Secondary | ICD-10-CM | POA: Diagnosis not present

## 2016-06-02 DIAGNOSIS — Z7901 Long term (current) use of anticoagulants: Secondary | ICD-10-CM

## 2016-06-02 DIAGNOSIS — I4891 Unspecified atrial fibrillation: Secondary | ICD-10-CM | POA: Diagnosis not present

## 2016-06-02 LAB — POCT INR: INR: 3.5

## 2016-06-16 ENCOUNTER — Encounter (INDEPENDENT_AMBULATORY_CARE_PROVIDER_SITE_OTHER): Payer: Self-pay

## 2016-06-16 ENCOUNTER — Encounter: Payer: Self-pay | Admitting: Internal Medicine

## 2016-06-16 ENCOUNTER — Ambulatory Visit (INDEPENDENT_AMBULATORY_CARE_PROVIDER_SITE_OTHER): Payer: Medicare Other | Admitting: Internal Medicine

## 2016-06-16 VITALS — BP 114/70 | HR 68 | Ht 68.0 in | Wt 190.0 lb

## 2016-06-16 DIAGNOSIS — Z95810 Presence of automatic (implantable) cardiac defibrillator: Secondary | ICD-10-CM

## 2016-06-16 DIAGNOSIS — I5022 Chronic systolic (congestive) heart failure: Secondary | ICD-10-CM | POA: Diagnosis not present

## 2016-06-16 DIAGNOSIS — I482 Chronic atrial fibrillation, unspecified: Secondary | ICD-10-CM

## 2016-06-16 DIAGNOSIS — I255 Ischemic cardiomyopathy: Secondary | ICD-10-CM | POA: Diagnosis not present

## 2016-06-16 DIAGNOSIS — I259 Chronic ischemic heart disease, unspecified: Secondary | ICD-10-CM

## 2016-06-16 LAB — CUP PACEART INCLINIC DEVICE CHECK
Battery Voltage: 3.04 V
Brady Statistic AS VS Percent: 22.08 %
Brady Statistic RA Percent Paced: 0 %
Date Time Interrogation Session: 20170731134608
HIGH POWER IMPEDANCE MEASURED VALUE: 62 Ohm
HighPow Impedance: 48 Ohm
Implantable Lead Implant Date: 20071010
Implantable Lead Location: 753858
Implantable Lead Model: 4194
Implantable Lead Model: 7121
Lead Channel Impedance Value: 475 Ohm
Lead Channel Pacing Threshold Amplitude: 0.5 V
Lead Channel Pacing Threshold Amplitude: 0.75 V
Lead Channel Pacing Threshold Pulse Width: 0.4 ms
Lead Channel Pacing Threshold Pulse Width: 0.4 ms
Lead Channel Sensing Intrinsic Amplitude: 14.75 mV
Lead Channel Sensing Intrinsic Amplitude: 15 mV
Lead Channel Setting Pacing Amplitude: 2 V
Lead Channel Setting Pacing Amplitude: 2.5 V
Lead Channel Setting Pacing Pulse Width: 0.4 ms
Lead Channel Setting Pacing Pulse Width: 0.4 ms
Lead Channel Setting Sensing Sensitivity: 0.3 mV
MDC IDC LEAD IMPLANT DT: 20071010
MDC IDC LEAD IMPLANT DT: 20120419
MDC IDC LEAD LOCATION: 753859
MDC IDC LEAD LOCATION: 753860
MDC IDC MSMT BATTERY REMAINING LONGEVITY: 106 mo
MDC IDC MSMT LEADCHNL LV IMPEDANCE VALUE: 361 Ohm
MDC IDC MSMT LEADCHNL LV IMPEDANCE VALUE: 589 Ohm
MDC IDC MSMT LEADCHNL LV IMPEDANCE VALUE: 836 Ohm
MDC IDC MSMT LEADCHNL RA IMPEDANCE VALUE: 418 Ohm
MDC IDC MSMT LEADCHNL RA SENSING INTR AMPL: 0.875 mV
MDC IDC MSMT LEADCHNL RA SENSING INTR AMPL: 1.125 mV
MDC IDC MSMT LEADCHNL RV IMPEDANCE VALUE: 589 Ohm
MDC IDC STAT BRADY AP VP PERCENT: 0 %
MDC IDC STAT BRADY AP VS PERCENT: 0 %
MDC IDC STAT BRADY AS VP PERCENT: 77.92 %
MDC IDC STAT BRADY RV PERCENT PACED: 80.71 %

## 2016-06-16 NOTE — Progress Notes (Signed)
Patient Care Team: [name], MD as PCP - General (Specialist)   HPI  [name] is a 70 y.o. male Seen in followup for CRT-D implanted for congestive heart failure in the setting of ischemic heart disease. Hospitalization 2013 had required milrinone He underwent generator replacement April 2012 with replacement of his 6949 ICD lead and again 4/17.  He has permanent atrial fibrillation. Rate control is adequate.   He is followed by [facility] and Heart Faliure clinic(remotely)  .  Myoview April 2012 demonstrated no ischemia, inferolateral scar and an ejection fraction of 28%.  Echo 2014  LVEF 25% with BAE L>R repeat echo P    His major complaint  is restless legs. This gives rise to not being able to exercise and fatigue.NO chest pain or edema  12/16  Cr 1.46    4/17   Cr 1.46 K 4.3 00   Past Medical History:  Diagnosis Date  . 6949-lead    replaced 02/2011  . Acute on chronic renal insufficiency (HCC)   . AICD (automatic cardioverter/defibrillator) present    Medtronic CRT  . Asthma   . Atrial flutter ([place])    s/p TEE guided cardioversion  . CHF (congestive heart failure) ([place])   . Chronic drug-induced interstitial lung disorders ([place])   . COPD (chronic obstructive pulmonary disease) ([place])    " very MILD "  . Coronary artery disease    total occlusion of the right coronary artery, left to right collaterals.  He had Cypher stenting to the circumflex in  July 2003  . Family hx of colon cancer    2 SISTERS  . Fatty liver    CT  . Headache(784.0)   . Hyperlipemia   . Hypertension   . Hyponatremia   . Ischemic cardiomyopathy    Myoview 02/2011  EF 28%  Coronary artery disease  (total occlusion of the right coronary artery, left-to-right )  . LBBB (left bundle branch block)   . Nonsustained ventricular tachycardia ([place])   . Seizure disorder [facility])     Past Surgical History:  Procedure Laterality Date  . CORONARY ANGIOPLASTY    . EP IMPLANTABLE DEVICE N/A  03/05/2016   Procedure: ICD  Generator Changeout;  Surgeon: Deboraha Sprang, MD;  Location: [place] [facility];  Service: Cardiovascular;  Laterality: N/A;  . ESOPHAGOGASTRODUODENOSCOPY  09/13/2012   Procedure: ESOPHAGOGASTRODUODENOSCOPY (EGD);  Surgeon: Ladene Artist, MD,FACG;  Location: [facility];  Service: Endoscopy;  Laterality: N/A;  . HERNIA REPAIR    . INSERT / REPLACE / [place]  . RIGHT HEART CATHETERIZATION N/A 09/17/2012   Procedure: RIGHT HEART CATH;  Surgeon: Jolaine Artist, MD;  Location: [facility] CATH LAB;  Service: Cardiovascular;  Laterality: N/A;    Current Outpatient Prescriptions  Medication Sig Dispense Refill  . Ascorbic Acid (VITAMIN C) POWD Take 1 Dose by mouth daily.     . ASTRAGALUS PO Take 1 Dose by mouth daily.     . bisoprolol (ZEBETA) 5 MG tablet Take 2.5 mg by mouth daily.     . Calcium-Magnesium 500-250 MG TABS Take 1 tablet by mouth daily.     . colchicine 0.6 MG tablet Take 0.6 mg by mouth daily as needed (for gout flare).    . COREG CR 20 MG 24 hr capsule TAKE ONE CAPSULE BY MOUTH EVERY DAY 90 capsule 2  . dextromethorphan-guaiFENesin (MUCINEX DM) 30-600 MG 12hr tablet Take 1  tablet by mouth 2 (two) times daily as needed for cough.    Marland Kitchen DIGOX 125 MCG tablet TAKE 1 TABLET BY MOUTH EVERY OTHER DAY 45 tablet 5  . diphenhydrAMINE (BENADRYL) 25 mg capsule Take 25 mg by mouth every 6 (six) hours as needed for allergies.     . fish oil-omega-3 fatty acids 1000 MG capsule Take 2 g by mouth daily.    . furosemide (LASIX) 40 MG tablet Take 40 mg by mouth daily.     . hydrALAZINE (APRESOLINE) 25 MG tablet Take 1 tablet (25 mg total) by mouth 2 (two) times daily. 180 tablet 3  . JANTOVEN 5 MG tablet TAKE 1 TO 1 AND 1/2 TABLETS BY MOUTH DAILY AS DIRECTED BY COUMADIN CLINIC 135 tablet 0  . lisinopril (PRINIVIL,ZESTRIL) 20 MG tablet Take 20 mg by mouth at bedtime.    . magnesium oxide (MAG-OX) 400 MG tablet Take 1 tablet (400 mg total) by  mouth daily. 90 tablet 3  . montelukast (SINGULAIR) 10 MG tablet Take 10 mg by mouth at bedtime.   2  . Multiple Vitamin (MULTIVITAMIN WITH MINERALS) TABS Take 1 tablet by mouth daily.    . nitroGLYCERIN (NITROSTAT) 0.4 MG SL tablet Place 0.4 mg under the tongue every 5 (five) minutes as needed for chest pain. (up to 3 doses) For chest pain    . ondansetron (ZOFRAN-ODT) 4 MG disintegrating tablet Take 4 mg by mouth every 4 (four) hours as needed for nausea.     Marland Kitchen spironolactone (ALDACTONE) 25 MG tablet Take 0.5 tablets (12.5 mg total) by mouth daily. 45 tablet 3  . warfarin (JANTOVEN) 5 MG tablet Take 5-7.5 mg by mouth daily. 7.5mg  Monday, Wednesday, Friday.  5mg  all other days.     No current facility-administered medications for this visit.     Allergies  Allergen Reactions  . Ace Inhibitors     Unknown  . Amlodipine Besylate     Numbness to lips; made legs give out  . Codeine Itching    Itching  . Corzide [Nadolol-Bendroflumethiazide]     Unknown  . Diovan [Valsartan]     Unknown  . Flagyl [Metronidazole]     Unknown  . Norvasc [Amlodipine Besylate]     Numbness to lips; made legs give out  . Omeprazole     Shut [name] kidneys down  . Other     Perfumes smells; paint smells; formaldehyde smells  . Penicillins     Childhood reaction  . Shellfish Allergy   . Latex Itching and Rash    Review of Systems negative except from HPI and PMH  Physical Exam BP 114/70   Pulse 68   Ht 5\' 8"  (1.727 m)   Wt 190 lb (86.2 kg)   SpO2 98%   BMI 28.89 kg/m  Well developed and well nourished in no acute distress HENT normal E scleral and icterus clear Neck Supple JVP flat; carotids brisk and full Clear to ausculation  Regular rate and rhythm, no murmurs gallops or rub Soft with active bowel sounds No clubbing cyanosis  Edema Alert and oriented, grossly normal motor and sensory function Skin Warm and little bit damp    Atrial fibrillation at 68 Intervals-/14/43 Intermittent  ventricular pacing   Assessment and  Plan  Atrial fibrillation-permanent  Ischemic heart disease   Implantable defibrillator-CRT-Medtronic The patient's device was interrogated.  The information was reviewed. No changes were made in the programming.     Congestive heart failure-chronic  Restless legs  Atrial fibrillation is permanent. Continue on anticoagulation  Without  bleeding issue   Hypotension and orthostasis- problematinc  We will decrease his hydralazine 50 tid>>25 bid continue carvedilol.  He has significant restless legs. I have reached out neurology for recommendation.  We have used Sinemet in the past. We will try 10/100  Potassium level was okay in April. Plan to check blood work again with a digoxin level when he sees Dr. Lenore Cordia in the fall.  No ischemia  Euvolemic

## 2016-06-16 NOTE — Patient Instructions (Signed)
Medication Instructions: - Your physician recommends that you continue on your current medications as directed. Please refer to the Current Medication list given to you today.  Labwork: - none  Procedures/Testing: - none  Follow-Up: - Remote monitoring is used to monitor your Pacemaker of ICD from home. This monitoring reduces the number of office visits required to check your device to one time per year. It allows [place] to keep an eye on the functioning of your device to ensure it is working properly. You are scheduled for a device check from home on 09/15/16. You may send your transmission at any time that day. If you have a wireless device, the transmission will be sent automatically. After your physician reviews your transmission, you will receive a postcard with your next transmission date.  - Your physician wants you to follow-up in: 5 months with Dr. Percival Spanish & 1 year with Dr. Caryl Comes. You will receive a reminder letter in the mail two months in advance. If you don't receive a letter, please call our office to schedule the follow-up appointment.  Any Additional Special Instructions Will Be Listed Below (If Applicable).     If you need a refill on your cardiac medications before your next appointment, please call your pharmacy.

## 2016-06-17 ENCOUNTER — Encounter: Payer: Self-pay | Admitting: Internal Medicine

## 2016-06-27 ENCOUNTER — Telehealth: Payer: Self-pay | Admitting: Internal Medicine

## 2016-06-27 MED ORDER — CARBIDOPA-LEVODOPA 10-100 MG PO TABS: ORAL_TABLET | ORAL | 3 refills | Status: DC

## 2016-06-27 NOTE — Telephone Encounter (Signed)
Follow up from office visit 7/31 with Dr. Caryl Comes. Per Dr. Caryl Comes- have the patient start Sinemet 10/100 mg one tablet at bedtime for restless leg syndrome. The patient is aware and agreeable with trying this. Will send to [facility] in [place].

## 2016-06-30 ENCOUNTER — Ambulatory Visit (INDEPENDENT_AMBULATORY_CARE_PROVIDER_SITE_OTHER): Payer: Medicare Other | Admitting: *Deleted

## 2016-06-30 DIAGNOSIS — Z5181 Encounter for therapeutic drug level monitoring: Secondary | ICD-10-CM | POA: Diagnosis not present

## 2016-06-30 DIAGNOSIS — I4892 Unspecified atrial flutter: Secondary | ICD-10-CM | POA: Diagnosis not present

## 2016-06-30 DIAGNOSIS — I4891 Unspecified atrial fibrillation: Secondary | ICD-10-CM

## 2016-06-30 DIAGNOSIS — Z7901 Long term (current) use of anticoagulants: Secondary | ICD-10-CM

## 2016-06-30 LAB — POCT INR: INR: 3.1

## 2016-07-07 ENCOUNTER — Other Ambulatory Visit: Payer: Self-pay | Admitting: *Deleted

## 2016-07-07 MED ORDER — JANTOVEN 5 MG PO TABS: ORAL_TABLET | ORAL | 1 refills | Status: DC

## 2016-07-08 ENCOUNTER — Other Ambulatory Visit: Payer: Self-pay | Admitting: Cardiology

## 2016-07-09 DIAGNOSIS — H25012 Cortical age-related cataract, left eye: Secondary | ICD-10-CM | POA: Diagnosis not present

## 2016-07-09 DIAGNOSIS — H2511 Age-related nuclear cataract, right eye: Secondary | ICD-10-CM | POA: Diagnosis not present

## 2016-07-09 DIAGNOSIS — H35031 Hypertensive retinopathy, right eye: Secondary | ICD-10-CM | POA: Diagnosis not present

## 2016-07-09 DIAGNOSIS — H35032 Hypertensive retinopathy, left eye: Secondary | ICD-10-CM | POA: Diagnosis not present

## 2016-07-21 ENCOUNTER — Other Ambulatory Visit: Payer: Self-pay | Admitting: Cardiology

## 2016-07-22 NOTE — Telephone Encounter (Signed)
Rx(s) sent to pharmacy electronically.  

## 2016-07-31 ENCOUNTER — Ambulatory Visit (INDEPENDENT_AMBULATORY_CARE_PROVIDER_SITE_OTHER): Payer: Medicare Other | Admitting: *Deleted

## 2016-07-31 ENCOUNTER — Encounter (INDEPENDENT_AMBULATORY_CARE_PROVIDER_SITE_OTHER): Payer: Self-pay

## 2016-07-31 DIAGNOSIS — Z5181 Encounter for therapeutic drug level monitoring: Secondary | ICD-10-CM | POA: Diagnosis not present

## 2016-07-31 DIAGNOSIS — I4891 Unspecified atrial fibrillation: Secondary | ICD-10-CM

## 2016-07-31 DIAGNOSIS — Z7901 Long term (current) use of anticoagulants: Secondary | ICD-10-CM

## 2016-07-31 DIAGNOSIS — I4892 Unspecified atrial flutter: Secondary | ICD-10-CM | POA: Diagnosis not present

## 2016-07-31 LAB — POCT INR: INR: 2.4

## 2016-09-02 ENCOUNTER — Ambulatory Visit (INDEPENDENT_AMBULATORY_CARE_PROVIDER_SITE_OTHER): Payer: Medicare Other | Admitting: *Deleted

## 2016-09-02 DIAGNOSIS — I4892 Unspecified atrial flutter: Secondary | ICD-10-CM | POA: Diagnosis not present

## 2016-09-02 DIAGNOSIS — Z5181 Encounter for therapeutic drug level monitoring: Secondary | ICD-10-CM

## 2016-09-02 DIAGNOSIS — Z7901 Long term (current) use of anticoagulants: Secondary | ICD-10-CM | POA: Diagnosis not present

## 2016-09-02 DIAGNOSIS — I259 Chronic ischemic heart disease, unspecified: Secondary | ICD-10-CM

## 2016-09-02 DIAGNOSIS — I4891 Unspecified atrial fibrillation: Secondary | ICD-10-CM

## 2016-09-02 LAB — POCT INR: INR: 2.2

## 2016-09-15 ENCOUNTER — Ambulatory Visit (INDEPENDENT_AMBULATORY_CARE_PROVIDER_SITE_OTHER): Payer: Medicare Other | Admitting: *Deleted

## 2016-09-15 DIAGNOSIS — I255 Ischemic cardiomyopathy: Secondary | ICD-10-CM

## 2016-09-16 NOTE — Progress Notes (Signed)
Remote ICD transmission.   

## 2016-09-19 ENCOUNTER — Encounter: Payer: Self-pay | Admitting: Cardiology

## 2016-09-25 LAB — CUP PACEART REMOTE DEVICE CHECK
Battery Remaining Longevity: 103 mo
Battery Voltage: 3 V
Brady Statistic AP VS Percent: 0 %
Brady Statistic RA Percent Paced: 0 %
Brady Statistic RV Percent Paced: 82.25 %
Date Time Interrogation Session: 20171030084223
HighPow Impedance: 48 Ohm
HighPow Impedance: 58 Ohm
Implantable Lead Implant Date: 20071010
Implantable Lead Location: 753859
Implantable Lead Location: 753860
Implantable Lead Model: 5076
Implantable Lead Model: 7121
Implantable Pulse Generator Implant Date: 20170419
Lead Channel Impedance Value: 399 Ohm
Lead Channel Impedance Value: 551 Ohm
Lead Channel Impedance Value: 608 Ohm
Lead Channel Pacing Threshold Amplitude: 0.5 V
Lead Channel Setting Pacing Pulse Width: 0.4 ms
MDC IDC LEAD IMPLANT DT: 20071010
MDC IDC LEAD IMPLANT DT: 20120419
MDC IDC LEAD LOCATION: 753858
MDC IDC LEAD MODEL: 4194
MDC IDC MSMT LEADCHNL LV IMPEDANCE VALUE: 399 Ohm
MDC IDC MSMT LEADCHNL LV IMPEDANCE VALUE: 817 Ohm
MDC IDC MSMT LEADCHNL LV PACING THRESHOLD AMPLITUDE: 0.875 V
MDC IDC MSMT LEADCHNL LV PACING THRESHOLD PULSEWIDTH: 0.4 ms
MDC IDC MSMT LEADCHNL RA SENSING INTR AMPL: 1.125 mV
MDC IDC MSMT LEADCHNL RA SENSING INTR AMPL: 1.125 mV
MDC IDC MSMT LEADCHNL RV IMPEDANCE VALUE: 456 Ohm
MDC IDC MSMT LEADCHNL RV PACING THRESHOLD PULSEWIDTH: 0.4 ms
MDC IDC MSMT LEADCHNL RV SENSING INTR AMPL: 15.75 mV
MDC IDC MSMT LEADCHNL RV SENSING INTR AMPL: 15.75 mV
MDC IDC SET LEADCHNL LV PACING AMPLITUDE: 2 V
MDC IDC SET LEADCHNL RV PACING AMPLITUDE: 2.5 V
MDC IDC SET LEADCHNL RV PACING PULSEWIDTH: 0.4 ms
MDC IDC SET LEADCHNL RV SENSING SENSITIVITY: 0.3 mV
MDC IDC STAT BRADY AP VP PERCENT: 0 %
MDC IDC STAT BRADY AS VP PERCENT: 79.68 %
MDC IDC STAT BRADY AS VS PERCENT: 20.32 %

## 2016-10-13 ENCOUNTER — Other Ambulatory Visit (HOSPITAL_COMMUNITY): Payer: Self-pay | Admitting: Internal Medicine

## 2016-10-13 ENCOUNTER — Other Ambulatory Visit: Payer: Self-pay | Admitting: Cardiology

## 2016-10-13 NOTE — Telephone Encounter (Signed)
REFILL 

## 2016-10-14 ENCOUNTER — Ambulatory Visit (INDEPENDENT_AMBULATORY_CARE_PROVIDER_SITE_OTHER): Payer: Medicare Other | Admitting: Pharmacist

## 2016-10-14 DIAGNOSIS — Z7901 Long term (current) use of anticoagulants: Secondary | ICD-10-CM | POA: Diagnosis not present

## 2016-10-14 DIAGNOSIS — I4892 Unspecified atrial flutter: Secondary | ICD-10-CM | POA: Diagnosis not present

## 2016-10-14 DIAGNOSIS — I4891 Unspecified atrial fibrillation: Secondary | ICD-10-CM

## 2016-10-14 DIAGNOSIS — Z5181 Encounter for therapeutic drug level monitoring: Secondary | ICD-10-CM

## 2016-10-14 DIAGNOSIS — I259 Chronic ischemic heart disease, unspecified: Secondary | ICD-10-CM

## 2016-10-14 LAB — POCT INR: INR: 5.4

## 2016-10-22 ENCOUNTER — Ambulatory Visit (INDEPENDENT_AMBULATORY_CARE_PROVIDER_SITE_OTHER): Payer: Medicare Other | Admitting: *Deleted

## 2016-10-22 DIAGNOSIS — I4892 Unspecified atrial flutter: Secondary | ICD-10-CM

## 2016-10-22 DIAGNOSIS — Z7901 Long term (current) use of anticoagulants: Secondary | ICD-10-CM | POA: Diagnosis not present

## 2016-10-22 DIAGNOSIS — Z5181 Encounter for therapeutic drug level monitoring: Secondary | ICD-10-CM | POA: Diagnosis not present

## 2016-10-22 DIAGNOSIS — I4891 Unspecified atrial fibrillation: Secondary | ICD-10-CM

## 2016-10-22 DIAGNOSIS — I259 Chronic ischemic heart disease, unspecified: Secondary | ICD-10-CM

## 2016-10-22 LAB — POCT INR: INR: 1.6

## 2016-11-06 ENCOUNTER — Other Ambulatory Visit (HOSPITAL_COMMUNITY): Payer: Self-pay | Admitting: Cardiology

## 2016-11-11 ENCOUNTER — Ambulatory Visit (INDEPENDENT_AMBULATORY_CARE_PROVIDER_SITE_OTHER): Payer: Medicare Other | Admitting: Pharmacist

## 2016-11-11 DIAGNOSIS — Z5181 Encounter for therapeutic drug level monitoring: Secondary | ICD-10-CM | POA: Diagnosis not present

## 2016-11-11 DIAGNOSIS — Z7901 Long term (current) use of anticoagulants: Secondary | ICD-10-CM | POA: Diagnosis not present

## 2016-11-11 DIAGNOSIS — I4892 Unspecified atrial flutter: Secondary | ICD-10-CM | POA: Diagnosis not present

## 2016-11-11 DIAGNOSIS — I4891 Unspecified atrial fibrillation: Secondary | ICD-10-CM

## 2016-11-11 DIAGNOSIS — I259 Chronic ischemic heart disease, unspecified: Secondary | ICD-10-CM

## 2016-11-11 LAB — POCT INR: INR: 1.7

## 2016-11-13 DIAGNOSIS — Z85828 Personal history of other malignant neoplasm of skin: Secondary | ICD-10-CM | POA: Diagnosis not present

## 2016-11-13 DIAGNOSIS — D229 Melanocytic nevi, unspecified: Secondary | ICD-10-CM | POA: Diagnosis not present

## 2016-11-13 DIAGNOSIS — L570 Actinic keratosis: Secondary | ICD-10-CM | POA: Diagnosis not present

## 2016-11-13 DIAGNOSIS — L821 Other seborrheic keratosis: Secondary | ICD-10-CM | POA: Diagnosis not present

## 2016-11-13 DIAGNOSIS — L820 Inflamed seborrheic keratosis: Secondary | ICD-10-CM | POA: Diagnosis not present

## 2016-11-13 DIAGNOSIS — L814 Other melanin hyperpigmentation: Secondary | ICD-10-CM | POA: Diagnosis not present

## 2016-11-13 DIAGNOSIS — R238 Other skin changes: Secondary | ICD-10-CM | POA: Diagnosis not present

## 2016-12-02 ENCOUNTER — Ambulatory Visit (INDEPENDENT_AMBULATORY_CARE_PROVIDER_SITE_OTHER): Payer: Medicare Other | Admitting: *Deleted

## 2016-12-02 DIAGNOSIS — I4892 Unspecified atrial flutter: Secondary | ICD-10-CM | POA: Diagnosis not present

## 2016-12-02 DIAGNOSIS — Z7901 Long term (current) use of anticoagulants: Secondary | ICD-10-CM | POA: Diagnosis not present

## 2016-12-02 DIAGNOSIS — I4891 Unspecified atrial fibrillation: Secondary | ICD-10-CM | POA: Diagnosis not present

## 2016-12-02 DIAGNOSIS — Z5181 Encounter for therapeutic drug level monitoring: Secondary | ICD-10-CM | POA: Diagnosis not present

## 2016-12-02 LAB — POCT INR: INR: 2.2

## 2016-12-15 ENCOUNTER — Ambulatory Visit (INDEPENDENT_AMBULATORY_CARE_PROVIDER_SITE_OTHER): Payer: Medicare Other | Admitting: *Deleted

## 2016-12-15 DIAGNOSIS — I255 Ischemic cardiomyopathy: Secondary | ICD-10-CM | POA: Diagnosis not present

## 2016-12-15 NOTE — Progress Notes (Signed)
Remote ICD transmission.   

## 2016-12-16 ENCOUNTER — Encounter: Payer: Self-pay | Admitting: Cardiology

## 2016-12-18 LAB — CUP PACEART REMOTE DEVICE CHECK
Battery Remaining Longevity: 99 mo
Brady Statistic AP VS Percent: 0 %
Brady Statistic AS VS Percent: 19.09 %
Date Time Interrogation Session: 20180129123826
HIGH POWER IMPEDANCE MEASURED VALUE: 46 Ohm
HIGH POWER IMPEDANCE MEASURED VALUE: 57 Ohm
Implantable Lead Implant Date: 20071010
Implantable Lead Location: 753858
Implantable Lead Location: 753859
Implantable Lead Location: 753860
Implantable Pulse Generator Implant Date: 20170419
Lead Channel Impedance Value: 399 Ohm
Lead Channel Impedance Value: 399 Ohm
Lead Channel Impedance Value: 513 Ohm
Lead Channel Pacing Threshold Amplitude: 0.5 V
Lead Channel Pacing Threshold Amplitude: 0.875 V
Lead Channel Sensing Intrinsic Amplitude: 16 mV
Lead Channel Setting Pacing Amplitude: 2.5 V
Lead Channel Setting Pacing Pulse Width: 0.4 ms
MDC IDC LEAD IMPLANT DT: 20071010
MDC IDC LEAD IMPLANT DT: 20120419
MDC IDC MSMT BATTERY VOLTAGE: 2.98 V
MDC IDC MSMT LEADCHNL LV IMPEDANCE VALUE: 608 Ohm
MDC IDC MSMT LEADCHNL LV IMPEDANCE VALUE: 874 Ohm
MDC IDC MSMT LEADCHNL LV PACING THRESHOLD PULSEWIDTH: 0.4 ms
MDC IDC MSMT LEADCHNL RA IMPEDANCE VALUE: 418 Ohm
MDC IDC MSMT LEADCHNL RA SENSING INTR AMPL: 1.75 mV
MDC IDC MSMT LEADCHNL RA SENSING INTR AMPL: 1.75 mV
MDC IDC MSMT LEADCHNL RV PACING THRESHOLD PULSEWIDTH: 0.4 ms
MDC IDC MSMT LEADCHNL RV SENSING INTR AMPL: 16 mV
MDC IDC SET LEADCHNL LV PACING AMPLITUDE: 2 V
MDC IDC SET LEADCHNL RV PACING PULSEWIDTH: 0.4 ms
MDC IDC SET LEADCHNL RV SENSING SENSITIVITY: 0.3 mV
MDC IDC STAT BRADY AP VP PERCENT: 0 %
MDC IDC STAT BRADY AS VP PERCENT: 80.91 %
MDC IDC STAT BRADY RA PERCENT PACED: 0 %
MDC IDC STAT BRADY RV PERCENT PACED: 83.47 %

## 2016-12-30 ENCOUNTER — Ambulatory Visit (INDEPENDENT_AMBULATORY_CARE_PROVIDER_SITE_OTHER): Payer: Medicare Other | Admitting: *Deleted

## 2016-12-30 DIAGNOSIS — I4892 Unspecified atrial flutter: Secondary | ICD-10-CM | POA: Diagnosis not present

## 2016-12-30 DIAGNOSIS — I255 Ischemic cardiomyopathy: Secondary | ICD-10-CM

## 2016-12-30 DIAGNOSIS — Z7901 Long term (current) use of anticoagulants: Secondary | ICD-10-CM | POA: Diagnosis not present

## 2016-12-30 DIAGNOSIS — Z5181 Encounter for therapeutic drug level monitoring: Secondary | ICD-10-CM

## 2016-12-30 DIAGNOSIS — I4891 Unspecified atrial fibrillation: Secondary | ICD-10-CM | POA: Diagnosis not present

## 2016-12-30 LAB — POCT INR: INR: 2.1

## 2017-01-02 ENCOUNTER — Other Ambulatory Visit: Payer: Self-pay | Admitting: Cardiology

## 2017-01-02 MED ORDER — HYDRALAZINE HCL 25 MG PO TABS: 25.0000 mg | ORAL_TABLET | Freq: Two times a day (BID) | ORAL | 0 refills | Status: DC

## 2017-01-02 MED ORDER — CARVEDILOL PHOSPHATE ER 20 MG PO CP24: 20.0000 mg | ORAL_CAPSULE | Freq: Every day | ORAL | 0 refills | Status: DC

## 2017-01-02 NOTE — Telephone Encounter (Signed)
Rx(s) sent to pharmacy electronically.  

## 2017-01-02 NOTE — Telephone Encounter (Signed)
New message        *STAT* If patient is at the pharmacy, call can be transferred to refill team.   1. Which medications need to be refilled? (please list name of each medication and dose if known) coreg 20mg  and hydralazine 25mg   2. Which pharmacy/location (including street and city if local pharmacy) is medication to be sent to? [facility] [place]-------new pharmacy 3. Do they need a 30 day or 90 day supply? 90 day

## 2017-01-14 ENCOUNTER — Other Ambulatory Visit: Payer: Self-pay

## 2017-01-14 MED ORDER — DIGOXIN 125 MCG PO TABS: 125.0000 ug | ORAL_TABLET | ORAL | 0 refills | Status: DC

## 2017-01-27 ENCOUNTER — Ambulatory Visit (INDEPENDENT_AMBULATORY_CARE_PROVIDER_SITE_OTHER): Payer: Medicare Other | Admitting: *Deleted

## 2017-01-27 DIAGNOSIS — I4892 Unspecified atrial flutter: Secondary | ICD-10-CM | POA: Diagnosis not present

## 2017-01-27 DIAGNOSIS — Z7901 Long term (current) use of anticoagulants: Secondary | ICD-10-CM

## 2017-01-27 DIAGNOSIS — Z5181 Encounter for therapeutic drug level monitoring: Secondary | ICD-10-CM | POA: Diagnosis not present

## 2017-01-27 DIAGNOSIS — I4891 Unspecified atrial fibrillation: Secondary | ICD-10-CM

## 2017-01-27 DIAGNOSIS — I255 Ischemic cardiomyopathy: Secondary | ICD-10-CM

## 2017-01-27 LAB — POCT INR: INR: 3.1

## 2017-01-28 ENCOUNTER — Encounter: Payer: Self-pay | Admitting: Cardiology

## 2017-02-04 NOTE — Progress Notes (Signed)
HPI   The patient presents for followup of his cardiomyopathy. Since I last saw him he saw Dr. Caryl Comes and had his hydralazine decreased secondary to some dizziness.  He was also treated for restless legs.  He also had a generator change out.  Today he says he is fatigue. He has not started his Sinemet which she was given for his restless legs as he was worried about it. He has tiredness. He does not have any new shortness of breath although he is chronically has dyspnea with exertion. Because his legs have been bothering him he's been tired and he's not sleeping well he hasn't been exercising and he has gained some weight. He denies any chest pressure, neck or arm discomfort. He's had no new palpitations, presyncope or syncope. He's had no PND or orthopnea.  Allergies  Allergen Reactions  . Ace Inhibitors     Unknown  . Corzide [Nadolol-Bendroflumethiazide]     Unknown  . Diovan [Valsartan]     Unknown  . Flagyl [Metronidazole]     Unknown  . Omeprazole     Shut my kidneys down  . Other     Perfumes smells; paint smells; formaldehyde smells  . Penicillins     Childhood reaction  . Shellfish Allergy   . Amlodipine Besylate Other (See Comments)    Numbness to lips; made legs give out  . Codeine Itching    Itching  . Latex Itching and Rash  . Norvasc [Amlodipine Besylate] Other (See Comments)    Numbness to lips; made legs give out    Current Outpatient Prescriptions  Medication Sig Dispense Refill  . Ascorbic Acid (VITAMIN C) POWD Take 1 Dose by mouth daily.     . ASTRAGALUS PO Take 1 Dose by mouth daily.     . bisoprolol (ZEBETA) 5 MG tablet Take 2.5 mg by mouth daily.     . Calcium-Magnesium 500-250 MG TABS Take 1 tablet by mouth daily.     . carbidopa-levodopa (SINEMET IR) 10-100 MG tablet Take one table (10/100 mg) daily at bedtime for restless legs 30 tablet 3  . carvedilol (COREG CR) 20 MG 24 hr capsule Take 1 capsule (20 mg total) by mouth daily. <PLEASE MAKE APPOINTMENT  FOR REFILLS> 90 capsule 0  . cetirizine (ZYRTEC) 10 MG tablet Take 10 mg by mouth daily.    . colchicine 0.6 MG tablet Take 0.6 mg by mouth daily as needed (for gout flare).    Marland Kitchen dextromethorphan-guaiFENesin (MUCINEX DM) 30-600 MG 12hr tablet Take 1 tablet by mouth 2 (two) times daily as needed for cough.    . digoxin (DIGOX) 0.125 MG tablet Take 1 tablet (125 mcg total) by mouth every other day. PLEASE CONTACT OFFICE FOR ADDITIONAL REFILLS 7 tablet 0  . diphenhydrAMINE (BENADRYL) 25 mg capsule Take 25 mg by mouth every 6 (six) hours as needed for allergies.     . fish oil-omega-3 fatty acids 1000 MG capsule Take 2 g by mouth daily.    . furosemide (LASIX) 40 MG tablet TAKE 1 TABLET BY MOUTH TWICE DAILY 60 tablet 0  . hydrALAZINE (APRESOLINE) 25 MG tablet Take 1 tablet (25 mg total) by mouth 2 (two) times daily. <PLEASE MAKE APPOINTMENT FOR REFILLS> 180 tablet 0  . JANTOVEN 5 MG tablet TAKE 1 TO 1 AND 1/2 TABLETS BY MOUTH DAILY AS DIRECTED BY COUMADIN CLINIC 135 tablet 1  . lisinopril (PRINIVIL,ZESTRIL) 20 MG tablet Take 1 tablet (20 mg total) by mouth daily.  KEEP OV. 90 tablet 0  . magnesium oxide (MAG-OX) 400 MG tablet Take 1 tablet (400 mg total) by mouth daily. 90 tablet 3  . Multiple Vitamin (MULTIVITAMIN WITH MINERALS) TABS Take 1 tablet by mouth daily.    . nitroGLYCERIN (NITROSTAT) 0.4 MG SL tablet Place 0.4 mg under the tongue every 5 (five) minutes as needed for chest pain. (up to 3 doses) For chest pain    . ondansetron (ZOFRAN-ODT) 4 MG disintegrating tablet Take 4 mg by mouth every 4 (four) hours as needed for nausea.     Marland Kitchen spironolactone (ALDACTONE) 25 MG tablet TAKE 1/2 TABLET(12.5 MG) BY MOUTH DAILY 45 tablet 2   No current facility-administered medications for this visit.     Past Medical History:  Diagnosis Date  . 6949-lead    replaced 02/2011  . Acute on chronic renal insufficiency   . AICD (automatic cardioverter/defibrillator) present    Medtronic CRT  . Asthma   .  Atrial flutter ([place])    s/p TEE guided cardioversion  . CHF (congestive heart failure) ([place])   . Chronic drug-induced interstitial lung disorders ([place])   . COPD (chronic obstructive pulmonary disease) ([place])    " very MILD "  . Coronary artery disease    total occlusion of the right coronary artery, left to right collaterals.  He had Cypher stenting to the circumflex in  July 2003  . Family hx of colon cancer    2 SISTERS  . Fatty liver    CT  . Headache(784.0)   . Hyperlipemia   . Hypertension   . Hyponatremia   . Ischemic cardiomyopathy    Myoview 02/2011  EF 28%  Coronary artery disease  (total occlusion of the right coronary artery, left-to-right )  . LBBB (left bundle branch block)   . Nonsustained ventricular tachycardia ([place])   . Seizure disorder [facility])     Past Surgical History:  Procedure Laterality Date  . CORONARY ANGIOPLASTY    . EP IMPLANTABLE DEVICE N/A 03/05/2016   Procedure: ICD  Generator Changeout;  Surgeon: Deboraha Sprang, MD;  Location: [place] [facility];  Service: Cardiovascular;  Laterality: N/A;  . ESOPHAGOGASTRODUODENOSCOPY  09/13/2012   Procedure: ESOPHAGOGASTRODUODENOSCOPY (EGD);  Surgeon: Ladene Artist, MD,FACG;  Location: [facility];  Service: Endoscopy;  Laterality: N/A;  . HERNIA REPAIR    . INSERT / REPLACE / [place]  . RIGHT HEART CATHETERIZATION N/A 09/17/2012   Procedure: RIGHT HEART CATH;  Surgeon: Jolaine Artist, MD;  Location: [facility] CATH LAB;  Service: Cardiovascular;  Laterality: N/A;    ROS:  Nasal congestion.   Otherwise as stated in the HPI and negative for all other systems.  PHYSICAL EXAM BP 110/70   Pulse 82   Ht 5\' 8"  (1.727 m)   Wt 191 lb 12.8 oz (87 kg)   BMI 29.16 kg/m  GENERAL:  Well appearing NECK:  No jugular venous distention, waveform within normal limits, carotid upstroke brisk and symmetric, no bruits, no thyromegaly LUNGS:  Clear to auscultation bilaterally CHEST:  ICD pocket well  healed. HEART:  PMI not displaced or sustained,S1 and S2 within normal limits, no S3, no S4, no clicks, no rubs, no murmurs ABD:  Flat, positive bowel sounds normal in frequency in pitch, no bruits, no rebound, no guarding, no midline pulsatile mass, no hepatomegaly, no splenomegaly EXT:  2 plus pulses throughout, no edema, no cyanosis no clubbing  ASSESSMENT AND PLAN  Chronic  Systolic CHF:  He's on an odd dose of combination of beta blockers.   However, this is what works for him.   He will remain on the meds as listed.   Coronary Artery Disease: The patient has no new sypmtoms.  No further cardiovascular testing is indicated.  We will continue with aggressive risk reduction and meds as listed.    Hypertension:     This is being managed in the context of treating his CHF.  No changes in meds are planned.   Atrial Flutter:   The patient  tolerates anticoagulation. We will continue with the meds as listed.  I will check a CBC.   Chronic Kidney Disease:    This has been stable.  I will check a BMET today.   Hyponatremia:   He will have a BMET.

## 2017-02-05 ENCOUNTER — Encounter: Payer: Self-pay | Admitting: Cardiology

## 2017-02-05 ENCOUNTER — Ambulatory Visit (INDEPENDENT_AMBULATORY_CARE_PROVIDER_SITE_OTHER): Payer: Medicare Other | Admitting: Cardiology

## 2017-02-05 VITALS — BP 110/70 | HR 82 | Ht 68.0 in | Wt 191.8 lb

## 2017-02-05 DIAGNOSIS — I5022 Chronic systolic (congestive) heart failure: Secondary | ICD-10-CM

## 2017-02-05 DIAGNOSIS — Z79899 Other long term (current) drug therapy: Secondary | ICD-10-CM

## 2017-02-05 DIAGNOSIS — E871 Hypo-osmolality and hyponatremia: Secondary | ICD-10-CM | POA: Diagnosis not present

## 2017-02-05 DIAGNOSIS — I255 Ischemic cardiomyopathy: Secondary | ICD-10-CM

## 2017-02-05 DIAGNOSIS — I480 Paroxysmal atrial fibrillation: Secondary | ICD-10-CM | POA: Diagnosis not present

## 2017-02-05 LAB — CBC
HCT: 40.6 % (ref 38.5–50.0)
Hemoglobin: 13.7 g/dL (ref 13.2–17.1)
MCH: 34.3 pg — ABNORMAL HIGH (ref 27.0–33.0)
MCHC: 33.7 g/dL (ref 32.0–36.0)
MCV: 101.8 fL — AB (ref 80.0–100.0)
MPV: 11 fL (ref 7.5–12.5)
PLATELETS: 259 10*3/uL (ref 140–400)
RBC: 3.99 MIL/uL — ABNORMAL LOW (ref 4.20–5.80)
RDW: 14 % (ref 11.0–15.0)
WBC: 5.9 10*3/uL (ref 3.8–10.8)

## 2017-02-05 LAB — BASIC METABOLIC PANEL
BUN: 27 mg/dL — AB (ref 7–25)
CHLORIDE: 101 mmol/L (ref 98–110)
CO2: 23 mmol/L (ref 20–31)
CREATININE: 1.61 mg/dL — AB (ref 0.70–1.18)
Calcium: 9.2 mg/dL (ref 8.6–10.3)
Glucose, Bld: 78 mg/dL (ref 65–99)
Potassium: 5.5 mmol/L — ABNORMAL HIGH (ref 3.5–5.3)
Sodium: 135 mmol/L (ref 135–146)

## 2017-02-05 NOTE — Patient Instructions (Signed)
Medication Instructions:  Continue current medications  Labwork: BMP and CBC  Testing/Procedures: None Ordered  Follow-Up: Your physician recommends that you schedule a follow-up appointment in: July with Dr Caryl Comes  Your physician wants you to follow-up in: January 2019 You will receive a reminder letter in the mail two months in advance. If you don't receive a letter, please call our office to schedule the follow-up appointment.   Any Other Special Instructions Will Be Listed Below (If Applicable).   If you need a refill on your cardiac medications before your next appointment, please call your pharmacy.

## 2017-02-06 ENCOUNTER — Encounter: Payer: Self-pay | Admitting: Cardiology

## 2017-02-09 ENCOUNTER — Telehealth: Payer: Self-pay | Admitting: *Deleted

## 2017-02-09 ENCOUNTER — Other Ambulatory Visit: Payer: Self-pay | Admitting: Cardiology

## 2017-02-09 DIAGNOSIS — Z79899 Other long term (current) drug therapy: Secondary | ICD-10-CM

## 2017-02-09 NOTE — Telephone Encounter (Signed)
BMP ordered to get done, pt want to do labs at [facility]

## 2017-02-09 NOTE — Telephone Encounter (Signed)
-----   Message from Minus Breeding, MD sent at 02/08/2017  9:23 PM EDT ----- Potassium is slightly high.  Please repeat this week a BMET. Call Mr. Rockers with the results and send results to [name], MD

## 2017-02-10 ENCOUNTER — Other Ambulatory Visit: Payer: Self-pay | Admitting: Cardiology

## 2017-02-10 NOTE — Telephone Encounter (Signed)
REFILL 

## 2017-02-17 ENCOUNTER — Other Ambulatory Visit: Payer: Medicare Other | Admitting: *Deleted

## 2017-02-17 DIAGNOSIS — I480 Paroxysmal atrial fibrillation: Secondary | ICD-10-CM | POA: Diagnosis not present

## 2017-02-17 LAB — BASIC METABOLIC PANEL
BUN/Creatinine Ratio: 18 (ref 10–24)
BUN: 38 mg/dL — ABNORMAL HIGH (ref 8–27)
CALCIUM: 9 mg/dL (ref 8.6–10.2)
CO2: 19 mmol/L (ref 18–29)
Chloride: 97 mmol/L (ref 96–106)
Creatinine, Ser: 2.11 mg/dL — ABNORMAL HIGH (ref 0.76–1.27)
GFR, EST AFRICAN AMERICAN: 35 mL/min/{1.73_m2} — AB (ref >59)
GFR, EST NON AFRICAN AMERICAN: 30 mL/min/{1.73_m2} — AB (ref >59)
Glucose: 104 mg/dL — ABNORMAL HIGH (ref 65–99)
POTASSIUM: 4.6 mmol/L (ref 3.5–5.2)
Sodium: 134 mmol/L (ref 134–144)

## 2017-02-17 NOTE — Progress Notes (Signed)
bmp 

## 2017-02-18 ENCOUNTER — Telehealth: Payer: Self-pay | Admitting: *Deleted

## 2017-02-18 DIAGNOSIS — Z79899 Other long term (current) drug therapy: Secondary | ICD-10-CM

## 2017-02-18 NOTE — Telephone Encounter (Signed)
Left message to call back in regards to labs from 02/17/17  ( increase creatinine 2.11, potassium stable 4.6)  Result reviewed with chris berge np   per order reduce lasix to 40 mg daily and recheck BMP LEFT MESSAGE FROm PATIENT TO RETURN CALL about changes

## 2017-02-19 NOTE — Telephone Encounter (Signed)
BMP ordered. Lasix change to 20 mg daily

## 2017-02-19 NOTE — Telephone Encounter (Signed)
Called and spoke with pt about changes to his Lasix, pt stated he's been taking Lasix 40 mg daily, he state it was change when he was discharge from the hospital, Informed Melvyn Neth, PA about the pt Lasix and how he is taking it, she advised that pt hold Lisinopril and decrease lasix 20 mg daily since his weight have decrease after it was over 190 in the hospital, pt stated his weight is 183 today, BMP was ordered for pt to get done in 1 week, pt voice understanding to his medications changes

## 2017-02-26 ENCOUNTER — Other Ambulatory Visit: Payer: Medicare Other | Admitting: *Deleted

## 2017-02-26 ENCOUNTER — Ambulatory Visit (INDEPENDENT_AMBULATORY_CARE_PROVIDER_SITE_OTHER): Payer: Medicare Other | Admitting: Pharmacist

## 2017-02-26 DIAGNOSIS — I480 Paroxysmal atrial fibrillation: Secondary | ICD-10-CM | POA: Diagnosis not present

## 2017-02-26 DIAGNOSIS — I2510 Atherosclerotic heart disease of native coronary artery without angina pectoris: Secondary | ICD-10-CM | POA: Diagnosis not present

## 2017-02-26 DIAGNOSIS — H35033 Hypertensive retinopathy, bilateral: Secondary | ICD-10-CM | POA: Diagnosis not present

## 2017-02-26 DIAGNOSIS — H25013 Cortical age-related cataract, bilateral: Secondary | ICD-10-CM | POA: Diagnosis not present

## 2017-02-26 DIAGNOSIS — I255 Ischemic cardiomyopathy: Secondary | ICD-10-CM

## 2017-02-26 DIAGNOSIS — H2513 Age-related nuclear cataract, bilateral: Secondary | ICD-10-CM | POA: Diagnosis not present

## 2017-02-26 DIAGNOSIS — Z5181 Encounter for therapeutic drug level monitoring: Secondary | ICD-10-CM

## 2017-02-26 DIAGNOSIS — H25043 Posterior subcapsular polar age-related cataract, bilateral: Secondary | ICD-10-CM | POA: Diagnosis not present

## 2017-02-26 DIAGNOSIS — H2512 Age-related nuclear cataract, left eye: Secondary | ICD-10-CM | POA: Diagnosis not present

## 2017-02-26 LAB — POCT INR: INR: 3.3

## 2017-02-26 NOTE — Addendum Note (Signed)
Addended by: Eulis Foster on: 02/26/2017 10:43 AM   Modules accepted: Orders

## 2017-02-27 LAB — BASIC METABOLIC PANEL
BUN/Creatinine Ratio: 16 (ref 10–24)
BUN: 27 mg/dL (ref 8–27)
CALCIUM: 9.2 mg/dL (ref 8.6–10.2)
CO2: 23 mmol/L (ref 18–29)
CREATININE: 1.68 mg/dL — AB (ref 0.76–1.27)
Chloride: 98 mmol/L (ref 96–106)
GFR, EST AFRICAN AMERICAN: 46 mL/min/{1.73_m2} — AB (ref >59)
GFR, EST NON AFRICAN AMERICAN: 40 mL/min/{1.73_m2} — AB (ref >59)
Glucose: 114 mg/dL — ABNORMAL HIGH (ref 65–99)
Potassium: 4.9 mmol/L (ref 3.5–5.2)
Sodium: 134 mmol/L (ref 134–144)

## 2017-03-02 ENCOUNTER — Telehealth: Payer: Self-pay | Admitting: *Deleted

## 2017-03-02 NOTE — Telephone Encounter (Signed)
Requesting surgical clearance:   1. Type of surgery: Cataract Removal with Intraocular Lens Placement  2. Surgeon: Johna Sheriff, MD  3. Surgical date: 03/31/2017  4. Medications that need to be help: None   Dr Herbert Deaner Office would appreciate your opinion regarding the stability of pt for approximately 30 mins under local anesthesia.  No need to discontinue any blood thinning agents for this procedure Is pt cleared for surgery?

## 2017-03-02 NOTE — Telephone Encounter (Signed)
The patient is at acceptable risk for the planned procedure.

## 2017-03-03 ENCOUNTER — Telehealth: Payer: Self-pay | Admitting: *Deleted

## 2017-03-03 NOTE — Telephone Encounter (Signed)
Called and spoke with Mr [name] about his blood work, on April 4th pt lasix was reduce to 20 mg daily and his Lisinopril is being held, pt stated that his weight has increase slightly, little swelling in his stomach, pt weight check this morning 186lbs, pt denied SOB, but want to know if he should go back to taking Lasix 40 mg daily and should he restart Lisinopril. His BP today 155/85 (P) 74. Please advised

## 2017-03-03 NOTE — Telephone Encounter (Signed)
Clearance faxed to Dr Herbert Deaner office via [employer]

## 2017-03-03 NOTE — Telephone Encounter (Signed)
Take Lasix 20 mg daily then stop.  Start lisinopril 10 mg daily.   Check BMET on Monday.

## 2017-03-04 ENCOUNTER — Telehealth: Payer: Self-pay | Admitting: Cardiology

## 2017-03-04 ENCOUNTER — Telehealth: Payer: Self-pay | Admitting: Cardiovascular Disease

## 2017-03-04 NOTE — Telephone Encounter (Signed)
New Message    Please call needs to speak with Nya again if possible about the fluid medication

## 2017-03-04 NOTE — Telephone Encounter (Signed)
Spoke with pt about his fluid medications, to continue taking 20 mg of lasix until his appt on monday

## 2017-03-04 NOTE — Telephone Encounter (Signed)
Spoke with Eulas Post, to understand [name] recommendation, as per Eulas Post he want pt to make appt in office , pt is schedule to see Cecilie Kicks on 04/23 @ 9:00 am, to further evaluate pt signs and symptoms.Marland Kitchen

## 2017-03-06 NOTE — Progress Notes (Signed)
Cardiology Office Note   Date:  03/09/2017   ID:  [name], DOB 1946-08-10, MRN [id number]  PCP:  [name] Roger, MD  Cardiologist:  Dr. Vita [name]     Chief Complaint  Patient presents with  . Edema    hx of ICM      History of Present Illness: [name] is a 71 y.o. male who presents for edema  He has a hx of ischemic cardiomyopathy with CRT-D followed by Dr. Caryl [name], hx PA Flutter, CAD     Last echo 11/2015 with EF 35-40% , LA moderately dilated  EF had improved from 25% to 35-40%  Today He complains of increasing abd girth.  His last optivol was in Jan and was stable.  Recent Cr was suddenly up to 2.11 and his lasix was held and his lisinopril stopped, K+ was elevated but he had been eating bananas and raisons, though he is on spironolactone as well.   His SOB is about the same.  No chest pain.  No lower ext edema.      Past Medical History:  Diagnosis Date  . 6949-lead    replaced 02/2011  . Acute on chronic renal insufficiency   . AICD (automatic cardioverter/defibrillator) present    Medtronic CRT  . Asthma   . Atrial flutter ([place])    s/p TEE guided cardioversion  . CHF (congestive heart failure) ([place])   . Chronic drug-induced interstitial lung disorders ([place])   . COPD (chronic obstructive pulmonary disease) ([place])    " very MILD "  . Coronary artery disease    total occlusion of the right coronary artery, left to right collaterals.  He had Cypher stenting to the circumflex in  July 2003  . Family hx of colon cancer    2 SISTERS  . Fatty liver    CT  . Headache(784.0)   . Hyperlipemia   . Hypertension   . Hyponatremia   . Ischemic cardiomyopathy    Myoview 02/2011  EF 28%  Coronary artery disease  (total occlusion of the right coronary artery, left-to-right )  . LBBB (left bundle branch block)   . Nonsustained ventricular tachycardia ([place])   . Seizure disorder [facility])     Past Surgical History:  Procedure Laterality Date  . CORONARY ANGIOPLASTY     . EP IMPLANTABLE DEVICE N/A 03/05/2016   Procedure: ICD  Generator Changeout;  Surgeon: [name] Sprang, MD;  Location: [facility] CV LAB;  Service: Cardiovascular;  Laterality: N/A;  . ESOPHAGOGASTRODUODENOSCOPY  09/13/2012   Procedure: ESOPHAGOGASTRODUODENOSCOPY (EGD);  Surgeon: [name] Artist, MD,FACG;  Location: [facility];  Service: Endoscopy;  Laterality: N/A;  . HERNIA REPAIR    . INSERT / REPLACE / [place]  . RIGHT HEART CATHETERIZATION N/A 09/17/2012   Procedure: RIGHT HEART CATH;  Surgeon: [name] Artist, MD;  Location: [facility] CATH LAB;  Service: Cardiovascular;  Laterality: N/A;     Current Outpatient Prescriptions  Medication Sig Dispense Refill  . Ascorbic Acid (VITAMIN C) POWD Take 1 Dose by mouth daily.     . ASTRAGALUS PO Take 1 Dose by mouth daily.     . bisoprolol (ZEBETA) 5 MG tablet Take 2.5 mg by mouth daily.     . Calcium-Magnesium 500-250 MG TABS Take 1 tablet by mouth daily.     . carvedilol (COREG CR) 20 MG 24 hr capsule Take 1 capsule (20 mg total) by mouth daily. [place]  capsule 3  . cetirizine (ZYRTEC) 10 MG tablet Take 10 mg by mouth daily.    . colchicine 0.6 MG tablet Take 0.6 mg by mouth daily as needed (for gout flare).    Marland Kitchen dextromethorphan-guaiFENesin (MUCINEX DM) 30-600 MG 12hr tablet Take 1 tablet by mouth 2 (two) times daily as needed for cough.    . digoxin (LANOXIN) 0.125 MG tablet Take 1 tablet (0.125 mg total) by mouth every other day. KEEP OV. 15 tablet 0  . diphenhydrAMINE (BENADRYL) 25 mg capsule Take 25 mg by mouth every 6 (six) hours as needed for allergies.     . fish oil-omega-3 fatty acids 1000 MG capsule Take 2 g by mouth daily.    . furosemide (LASIX) 20 MG tablet Take 20 mg by mouth daily.    . hydrALAZINE (APRESOLINE) 25 MG tablet Take 1 tablet (25 mg total) by mouth 2 (two) times daily. <PLEASE MAKE APPOINTMENT FOR REFILLS> 180 tablet 0  . JANTOVEN 5 MG tablet TAKE 1 TO 1 AND 1/2 TABLETS BY MOUTH DAILY AS  DIRECTED BY COUMADIN CLINIC 135 tablet 1  . magnesium oxide (MAG-OX) 400 MG tablet Take 1 tablet (400 mg total) by mouth daily. 90 tablet 3  . Multiple Vitamin (MULTIVITAMIN WITH MINERALS) TABS Take 1 tablet by mouth daily.    . nitroGLYCERIN (NITROSTAT) 0.4 MG SL tablet Place 0.4 mg under the tongue every 5 (five) minutes as needed for chest pain. (up to 3 doses) For chest pain    . ondansetron (ZOFRAN-ODT) 4 MG disintegrating tablet Take 4 mg by mouth every 4 (four) hours as needed for nausea.     Marland Kitchen spironolactone (ALDACTONE) 25 MG tablet TAKE 1/2 TABLET(12.5 MG) BY MOUTH DAILY 45 tablet 2  . lisinopril (PRINIVIL,ZESTRIL) 20 MG tablet Take 1 tablet (20 mg total) by mouth daily. KEEP OV. (Patient not taking: Reported on 03/09/2017) 90 tablet 0   No current facility-administered medications for this visit.     Allergies:   Sulfa antibiotics; Ace inhibitors; Corzide [nadolol-bendroflumethiazide]; Diovan [valsartan]; Flagyl [metronidazole]; Omeprazole; Other; Shellfish allergy; Tape; Amlodipine besylate; Codeine; Latex; Norvasc [amlodipine besylate]; and Penicillins    Social History:  The patient  reports that he has never smoked. He has never used smokeless tobacco. He reports that he drinks alcohol. He reports that he does not use drugs.   Family History:  The patient's family history includes Colon cancer in his sister and sister; Heart disease in his father and mother.    ROS:  General:no colds or fevers, no weight changes Skin:no rashes or ulcers HEENT:no blurred vision, no congestion CV:see HPI PUL:see HPI GI:no diarrhea constipation or melena, no indigestion GU:no hematuria, no dysuria MS:no joint pain, no claudication--some gout Neuro:no syncope, no lightheadedness Endo:no diabetes, no thyroid disease  Wt Readings from Last 3 Encounters:  03/09/17 190 lb 3.2 oz (86.3 kg)  02/05/17 191 lb 12.8 oz (87 kg)  06/16/16 190 lb (86.2 kg)     PHYSICAL EXAM: VS:  BP 115/68   Pulse  68   Ht 5\' 8"  (1.727 m)   Wt 190 lb 3.2 oz (86.3 kg)   BMI 28.92 kg/m  , BMI Body mass index is 28.92 kg/m. General:Pleasant affect, NAD Skin:Warm and dry, brisk capillary refill HEENT:normocephalic, sclera clear, mucus membranes moist Neck:supple, no JVD, no bruits  Heart:S1S2 RRR without murmur, gallup, rub or click Lungs:clear without rales, rhonchi, or wheezes WPY:KDXI, non tender, + BS, do not palpate liver spleen or masses Ext:no lower ext  edema, 2+ pedal pulses, 2+ radial pulses Neuro:alert and oriented X 3, MAE, follows commands, + facial symmetry    EKG:  EKG is NOT ordered today.    Recent Labs: 02/05/2017: Hemoglobin 13.7; Platelets 259 02/26/2017: BUN 27; Creatinine, Ser 1.68; Potassium 4.9; Sodium 134    Lipid Panel    Component Value Date/Time   CHOL 169 09/13/2010 2055   TRIG 77 09/13/2010 2055   HDL 29 (L) 09/13/2010 2055   CHOLHDL 5.8 Ratio 09/13/2010 2055   VLDL 15 09/13/2010 2055   LDLCALC 125 (H) 09/13/2010 2055       Other studies Reviewed: Additional studies/ records that were reviewed today include: . 12/11/16  ECHO Study Conclusions  - Left ventricle: The cavity size was normal. There was moderate   concentric hypertrophy. Systolic function was moderately reduced.   The estimated ejection fraction was in the range of 35% to 40%.   Diffuse hypokinesis. The study is not technically sufficient to   allow evaluation of LV diastolic function. - Aortic valve: Trileaflet. Sclerosis without stenosis. There was   trivial regurgitation. - Mitral valve: Mildly thickened leaflets . There was trivial   regurgitation. - Left atrium: The atrium was moderately dilated at 39 ml/m2. - Right ventricle: The cavity size was mildly dilated. Pacer wire   or catheter noted in right ventricle. Systolic function was   normal. - Right atrium: The atrium was mildly dilated. Pacer wire or   catheter noted in right atrium. - Tricuspid valve: There was trivial  regurgitation. - Pulmonary arteries: PA peak pressure: 21 mm Hg (S). - Inferior vena cava: The vessel was normal in size. The   respirophasic diameter changes were in the normal range (= 50%),   consistent with normal central venous pressure.  Impressions:  - Compared to a prior echo in 2014, the EF has improved from 25% to   35-40%.   ASSESSMENT AND PLAN:  1.  Edema- of abd area - pt still with complaints of this,  SOB is present about the same, his lisinopril has been stopped and his lasix decreased to 20 mg daily.   We discussed the way the ACE and lasix stress the kidneys.  He will continue to hold the lisnopril, and if labs stable today will have him take lasix 40 mg MWF and 20 mg other days.   If K+ is elevated would stop aldactone.   He will also send in remote on BiVICD to check optivol.   He will follow up He will follow up with Dr. Percival Spanish as planned   2. CKD-3 now back to baseline but more edema, will recheck BMP today.  3. Chronic systolic HF increase with decreasing lasix.    4. HTN controlled.  5.  Chronic a fib on anticoagulation  Current medicines are reviewed with the patient today.  The patient Has no concerns regarding medicines.  The following changes have been made:  See above Labs/ tests ordered today include:see above  Disposition:   FU:  see above  Signed, Cecilie Kicks, NP  03/09/2017 10:32 AM    [address], [place], [place], [place] Phone: [phone]; Fax: [phone]

## 2017-03-09 ENCOUNTER — Encounter: Payer: Self-pay | Admitting: Cardiology

## 2017-03-09 ENCOUNTER — Other Ambulatory Visit: Payer: Self-pay | Admitting: Cardiology

## 2017-03-09 ENCOUNTER — Telehealth: Payer: Self-pay

## 2017-03-09 ENCOUNTER — Ambulatory Visit (INDEPENDENT_AMBULATORY_CARE_PROVIDER_SITE_OTHER): Payer: Medicare Other | Admitting: Cardiology

## 2017-03-09 VITALS — BP 115/68 | HR 68 | Ht 68.0 in | Wt 190.2 lb

## 2017-03-09 DIAGNOSIS — I2589 Other forms of chronic ischemic heart disease: Secondary | ICD-10-CM | POA: Diagnosis not present

## 2017-03-09 DIAGNOSIS — I482 Chronic atrial fibrillation, unspecified: Secondary | ICD-10-CM

## 2017-03-09 DIAGNOSIS — N183 Chronic kidney disease, stage 3 unspecified: Secondary | ICD-10-CM

## 2017-03-09 DIAGNOSIS — I5022 Chronic systolic (congestive) heart failure: Secondary | ICD-10-CM

## 2017-03-09 DIAGNOSIS — I10 Essential (primary) hypertension: Secondary | ICD-10-CM

## 2017-03-09 DIAGNOSIS — I255 Ischemic cardiomyopathy: Secondary | ICD-10-CM | POA: Diagnosis not present

## 2017-03-09 NOTE — Telephone Encounter (Signed)
Received remote transmission.   Optivol suggests fluid accumulation since 02/23/2017.  Information routed to Cecilie Kicks, NP as requested.     BiV Pacing: 81.8% Clinical Status (15-Dec-2016 to 09-Mar-2017) Treated VT/VF 0 episodes  AT/AF 1 episode  Time in AT/AF 24.0 hr/day (100.0%)  Observations (2) (15-Dec-2016 to 09-Mar-2017)  AT/AF >= 6 hr for 85 days.  V. Pacing less than 90%.  3 month trend:   1 year trend:

## 2017-03-09 NOTE — Telephone Encounter (Signed)
Please let pt know with his PPM his fluid level was up, so he is in tune with his body and we will call tomorrow with lasix dose

## 2017-03-09 NOTE — Telephone Encounter (Signed)
Call to patient.  Advised [name] Kicks NP requested at his office appointment today and I wanted to follow up to check if he knows how to send a remote transmission and he stated yes.  He stated he has not been home very long and will send it shortly.

## 2017-03-09 NOTE — Patient Instructions (Addendum)
Medication Instructions:   Your physician recommends that you continue on your current medications as directed. Please refer to the Current Medication list given to you today.   If you need a refill on your cardiac medications before your next appointment, please call your pharmacy.  Labwork: BNP, DIGOXIN  AND BMET TODAY    Testing/Procedures: NONE ORDERED  TODAY    Follow-Up: WITH DR [facility] IN  [place] OR [place]   Any Other Special Instructions Will Be Listed Below (If Applicable).  PLEASE MAKE SURE YOU SEND IN A REMOTE TRANSMISSION FOR DEVICE FROM HOME. THE DEVICE TEAM HAS BEEN NOTIFIED.

## 2017-03-10 ENCOUNTER — Telehealth: Payer: Self-pay | Admitting: *Deleted

## 2017-03-10 LAB — DIGOXIN LEVEL: Digoxin Level: <0.5 ug/L — ABNORMAL LOW (ref 0.8–2.0)

## 2017-03-10 NOTE — Telephone Encounter (Signed)
-----   Message from Jeanann Lewandowsky, [place] sent at 03/10/2017  1:01 PM EDT -----   ----- Message ----- From: Isaiah Serge, NP Sent: 03/10/2017  12:20 PM To: Jeanann Lewandowsky, RMA  So Anderson [place], they were supposed to draw BNP and BMP as well but not done.   So lasix 20 mg daily except MWF 40 mg- if they cannot give BMP from lab yesterday then I need BMP tomorrow or Thursday.  And the optivol with device is volume overload.  Thanks.

## 2017-03-10 NOTE — Addendum Note (Signed)
Addended by: Claude Manges on: 03/10/2017 12:58 PM   Modules accepted: Orders

## 2017-03-10 NOTE — Telephone Encounter (Signed)
SPOKE TO PT ABOUT LABS  BNP AND BMP  THAT NEEDS TO BE DRAWN DUE INCORRECT LAB ORDERS PLACED IN EPIC , SO LABS WERE NOT DRAWN. PT AGREES TO COME BACK ON Thursday TO LABS  AT [facility].  ALSO PT KNOWS TO TAKE LASIX 20 MG DAILY EXCEPT ON M/W/F TAKE 40 MG.

## 2017-03-12 ENCOUNTER — Other Ambulatory Visit: Payer: Self-pay | Admitting: Cardiology

## 2017-03-12 DIAGNOSIS — Z79899 Other long term (current) drug therapy: Secondary | ICD-10-CM | POA: Diagnosis not present

## 2017-03-12 LAB — BASIC METABOLIC PANEL
BUN: 22 mg/dL (ref 7–25)
CALCIUM: 8.9 mg/dL (ref 8.6–10.3)
CO2: 21 mmol/L (ref 20–31)
CREATININE: 1.57 mg/dL — AB (ref 0.70–1.18)
Chloride: 101 mmol/L (ref 98–110)
GLUCOSE: 180 mg/dL — AB (ref 65–99)
Potassium: 4.7 mmol/L (ref 3.5–5.3)
Sodium: 135 mmol/L (ref 135–146)

## 2017-03-13 ENCOUNTER — Telehealth: Payer: Self-pay | Admitting: Cardiovascular Disease

## 2017-03-13 NOTE — Telephone Encounter (Signed)
Transmission last week to assess fluid overload.  [name] is aware that we need his scheduled transmission Monday and that it should transmit automatically.

## 2017-03-13 NOTE — Telephone Encounter (Signed)
New message    Pt is calling stating that he was told by Mickel Baas to do a home transmission when he got home. He said he is also scheduled for Monday and wants to know if he needs to do that one also.

## 2017-03-15 ENCOUNTER — Other Ambulatory Visit: Payer: Self-pay | Admitting: Cardiology

## 2017-03-16 ENCOUNTER — Other Ambulatory Visit: Payer: Self-pay | Admitting: Cardiology

## 2017-03-16 ENCOUNTER — Ambulatory Visit (INDEPENDENT_AMBULATORY_CARE_PROVIDER_SITE_OTHER): Payer: Medicare Other | Admitting: *Deleted

## 2017-03-16 DIAGNOSIS — I255 Ischemic cardiomyopathy: Secondary | ICD-10-CM | POA: Diagnosis not present

## 2017-03-16 NOTE — Progress Notes (Signed)
Remote ICD transmission.   

## 2017-03-16 NOTE — Telephone Encounter (Signed)
REFILL 

## 2017-03-17 ENCOUNTER — Encounter: Payer: Self-pay | Admitting: Cardiology

## 2017-03-17 LAB — CUP PACEART REMOTE DEVICE CHECK
Battery Voltage: 2.98 V
Brady Statistic AP VP Percent: 0 %
Brady Statistic AP VS Percent: 0 %
Brady Statistic AS VP Percent: 77.52 %
Brady Statistic AS VS Percent: 22.48 %
Brady Statistic RA Percent Paced: 0 %
HighPow Impedance: 46 Ohm
HighPow Impedance: 57 Ohm
Implantable Lead Implant Date: 20071010
Implantable Lead Implant Date: 20120419
Implantable Lead Location: 753860
Implantable Lead Model: 5076
Implantable Lead Model: 7121
Lead Channel Impedance Value: 418 Ohm
Lead Channel Impedance Value: 551 Ohm
Lead Channel Impedance Value: 779 Ohm
Lead Channel Pacing Threshold Pulse Width: 0.4 ms
Lead Channel Sensing Intrinsic Amplitude: 0.875 mV
Lead Channel Sensing Intrinsic Amplitude: 0.875 mV
Lead Channel Sensing Intrinsic Amplitude: 13.625 mV
Lead Channel Sensing Intrinsic Amplitude: 13.625 mV
Lead Channel Setting Pacing Amplitude: 2 V
Lead Channel Setting Pacing Amplitude: 2.5 V
Lead Channel Setting Pacing Pulse Width: 0.4 ms
Lead Channel Setting Sensing Sensitivity: 0.3 mV
MDC IDC LEAD IMPLANT DT: 20071010
MDC IDC LEAD LOCATION: 753858
MDC IDC LEAD LOCATION: 753859
MDC IDC MSMT BATTERY REMAINING LONGEVITY: 96 mo
MDC IDC MSMT LEADCHNL LV IMPEDANCE VALUE: 342 Ohm
MDC IDC MSMT LEADCHNL LV PACING THRESHOLD AMPLITUDE: 1 V
MDC IDC MSMT LEADCHNL LV PACING THRESHOLD PULSEWIDTH: 0.4 ms
MDC IDC MSMT LEADCHNL RV IMPEDANCE VALUE: 418 Ohm
MDC IDC MSMT LEADCHNL RV IMPEDANCE VALUE: 513 Ohm
MDC IDC MSMT LEADCHNL RV PACING THRESHOLD AMPLITUDE: 0.625 V
MDC IDC PG IMPLANT DT: 20170419
MDC IDC SESS DTM: 20180430041705
MDC IDC SET LEADCHNL LV PACING PULSEWIDTH: 0.4 ms
MDC IDC STAT BRADY RV PERCENT PACED: 80.19 %

## 2017-03-20 DIAGNOSIS — J302 Other seasonal allergic rhinitis: Secondary | ICD-10-CM | POA: Diagnosis not present

## 2017-03-24 ENCOUNTER — Other Ambulatory Visit: Payer: Self-pay | Admitting: Cardiology

## 2017-03-25 ENCOUNTER — Ambulatory Visit (INDEPENDENT_AMBULATORY_CARE_PROVIDER_SITE_OTHER): Payer: Medicare Other | Admitting: *Deleted

## 2017-03-25 DIAGNOSIS — Z5181 Encounter for therapeutic drug level monitoring: Secondary | ICD-10-CM

## 2017-03-25 DIAGNOSIS — I255 Ischemic cardiomyopathy: Secondary | ICD-10-CM

## 2017-03-25 DIAGNOSIS — I480 Paroxysmal atrial fibrillation: Secondary | ICD-10-CM

## 2017-03-25 LAB — POCT INR: INR: 1.8

## 2017-03-25 NOTE — Telephone Encounter (Signed)
REFILL 

## 2017-03-26 ENCOUNTER — Other Ambulatory Visit: Payer: Self-pay | Admitting: *Deleted

## 2017-03-26 DIAGNOSIS — J302 Other seasonal allergic rhinitis: Secondary | ICD-10-CM | POA: Diagnosis not present

## 2017-03-26 MED ORDER — FUROSEMIDE 20 MG PO TABS: 20.0000 mg | ORAL_TABLET | Freq: Every day | ORAL | 3 refills | Status: DC

## 2017-03-26 MED ORDER — JANTOVEN 5 MG PO TABS: ORAL_TABLET | ORAL | 3 refills | Status: DC

## 2017-03-26 NOTE — Telephone Encounter (Signed)
New message      *STAT* If patient is at the pharmacy, call can be transferred to refill team.   1. Which medications need to be refilled? (please list name of each medication and dose if known)  furosemide (LASIX) 20 MG tablet Take 1 tablet (20 mg total) by mouth daily.   JANTOVEN 5 MG tablet TAKE 1 TO 1 AND 1/2 TABLETS BY MOUTH DAILY AS DIRECTED BY COUMADIN CLINIC     2. Which pharmacy/location (including street and city if local pharmacy) is medication to be sent to? [facility] on [address] [place]  3. Do they need a 30 day or 90 day supply? [place]

## 2017-04-08 ENCOUNTER — Ambulatory Visit (INDEPENDENT_AMBULATORY_CARE_PROVIDER_SITE_OTHER): Payer: Medicare Other | Admitting: *Deleted

## 2017-04-08 DIAGNOSIS — I255 Ischemic cardiomyopathy: Secondary | ICD-10-CM

## 2017-04-08 DIAGNOSIS — Z5181 Encounter for therapeutic drug level monitoring: Secondary | ICD-10-CM | POA: Diagnosis not present

## 2017-04-08 DIAGNOSIS — I480 Paroxysmal atrial fibrillation: Secondary | ICD-10-CM | POA: Diagnosis not present

## 2017-04-08 LAB — POCT INR: INR: 2.4

## 2017-04-29 ENCOUNTER — Telehealth: Payer: Self-pay | Admitting: Cardiology

## 2017-04-29 NOTE — Telephone Encounter (Signed)
Spoke with pt, his lisinopril was decreased due to kidney function. Since the decrease he has had bp from 139-120/85-73. He is concerned it maybe getting too high. explained if he consistently gets numbers higher that 140/88. He will continue to monitor and call with concerns.

## 2017-04-29 NOTE — Telephone Encounter (Signed)
New message    Pt c/o medication issue:  1. Name of Medication: lisinopril (PRINIVIL,ZESTRIL) 20 MG tablet  2. How are you currently taking this medication (dosage and times per day)? 20mg   3. Are you having a reaction (difficulty breathing--STAT)? no  4. What is your medication issue? Pt was instructed to lower dosage of lisinopril (PRINIVIL,ZESTRIL) 20 MG tablet for his caterac surgery but his BP has inched up to 126/83 and he wants to know should he go back to his normal dosage or should he continue how he was instructed to take it.

## 2017-05-05 DIAGNOSIS — H25812 Combined forms of age-related cataract, left eye: Secondary | ICD-10-CM | POA: Diagnosis not present

## 2017-05-05 DIAGNOSIS — H2512 Age-related nuclear cataract, left eye: Secondary | ICD-10-CM | POA: Diagnosis not present

## 2017-05-06 ENCOUNTER — Ambulatory Visit (INDEPENDENT_AMBULATORY_CARE_PROVIDER_SITE_OTHER): Payer: Medicare Other

## 2017-05-06 DIAGNOSIS — I480 Paroxysmal atrial fibrillation: Secondary | ICD-10-CM | POA: Diagnosis not present

## 2017-05-06 DIAGNOSIS — I255 Ischemic cardiomyopathy: Secondary | ICD-10-CM | POA: Diagnosis not present

## 2017-05-06 DIAGNOSIS — Z5181 Encounter for therapeutic drug level monitoring: Secondary | ICD-10-CM | POA: Diagnosis not present

## 2017-05-06 LAB — POCT INR: INR: 2.7

## 2017-05-07 ENCOUNTER — Encounter: Payer: Self-pay | Admitting: Cardiology

## 2017-05-15 ENCOUNTER — Telehealth: Payer: Self-pay | Admitting: Cardiology

## 2017-05-15 NOTE — Telephone Encounter (Signed)
Patient called and stated that he received a letter from [employer] him that the ananlog adapter for his home monitor will no longer work. Gave patient two options. The first one being the wire-t adapter to hook his monitor up with the landline phone, and the second option being to come to the office every 3 months to have his device checked. Pt agreed to try the wire-t. Called [employer] to order wire-t for patient.   Spoke w/ [employer] and patient should have wire-t in 7-10 business days.

## 2017-05-18 ENCOUNTER — Telehealth: Payer: Self-pay | Admitting: Cardiology

## 2017-05-18 MED ORDER — BISOPROLOL FUMARATE 5 MG PO TABS: 2.5000 mg | ORAL_TABLET | Freq: Every day | ORAL | 0 refills | Status: DC

## 2017-05-18 NOTE — Telephone Encounter (Signed)
[name] [facility] back from [facility] and informed that the prescription has been sent in.

## 2017-05-18 NOTE — Telephone Encounter (Signed)
New message    Barnabas Lister from [facility] is calling for pt.  *STAT* If patient is at the pharmacy, call can be transferred to refill team.   1. Which medications need to be refilled? (please list name of each medication and dose if known) bisoprolol 5 mg  2. Which pharmacy/location (including street and city if local pharmacy) is medication to be sent to[facility] [place]  3. Do they need a 30 day or 90 day supply? 90 day

## 2017-05-22 ENCOUNTER — Telehealth: Payer: Self-pay | Admitting: Cardiology

## 2017-05-22 MED ORDER — HYDRALAZINE HCL 25 MG PO TABS: 25.0000 mg | ORAL_TABLET | Freq: Two times a day (BID) | ORAL | 0 refills | Status: DC

## 2017-05-22 NOTE — Telephone Encounter (Signed)
New message    Pt is calling to see why his hydralazine was called in for once a day. He said he takes it twice a day and now he is running low.

## 2017-05-22 NOTE — Telephone Encounter (Signed)
Returned the call back to the patient. He stated that at his last refill of Hydralazine 25 mg that the prescription got changed to once daily. He has been taking it bid since he was not informed otherwise. According to the last visit note on 4/23, he should be taking it bid. A new prescription has been sent in for him. He has an appointment on 7/10 with Dr. Percival Spanish. Will route to the provider for his knowledge.

## 2017-05-23 ENCOUNTER — Telehealth: Payer: Self-pay | Admitting: Internal Medicine

## 2017-05-23 NOTE — Telephone Encounter (Signed)
Patient heard a beeping sound around 7 pm from his He medtronic BiV ICD. He does not have a transmission device at home. He did not have any symptoms at that time. He had EOL beep in 01/2016 and at that time, interestingly I was also on call and had talked with him. Later on, he had his ICD generator changed. His usual beeping sound is around 9 pm but it was much earlier today and he was wondering about it. He was given the number to [employer] customer support to discuss the reason of beep and in case he has more questions, he will call us back. He has an appointment with Dr. Percival Spanish on coming Tuesday.

## 2017-05-25 ENCOUNTER — Telehealth: Payer: Self-pay | Admitting: Cardiology

## 2017-05-25 NOTE — Telephone Encounter (Signed)
Pt called and stated that he heard an alert tone Saturday around 7:10 PM. He described it as a long flat tone. After consulting w/ the Device Tech RN informed pt that tone was produced b/c a magnet was near his monitor and to just avoid having magnets near his ICD. Pt verbalized understanding.

## 2017-05-25 NOTE — Progress Notes (Signed)
HPI   The patient presents for followup of his cardiomyopathy. Since I last saw him he saw Cecilie Kicks NP and has had some problems with hyperkalemia.  He called the other day because his ICD alarm has sounded.  However, he only had this one time and he talked with [employer].  He has had to have it interrogated. However, he's been doing well. He denies any palpitations, presyncope or syncope. He's had no new shortness of breath, PND or orthopnea. He's had no weight gain or edema.  Allergies  Allergen Reactions  . Sulfa Antibiotics Shortness Of Breath  . Ace Inhibitors     Unknown  . Corzide [Nadolol-Bendroflumethiazide]     Unknown  . Diovan [Valsartan]     Unknown  . Flagyl [Metronidazole] Other (See Comments)    Unknown  . Omeprazole     Shut my kidneys down  . Other     Perfumes smells; paint smells; formaldehyde smells  . Shellfish Allergy   . Tape Other (See Comments)  . Amlodipine Besylate Other (See Comments)    Numbness to lips; made legs give out  . Codeine Itching and Rash    Itching  . Latex Itching and Rash  . Norvasc [Amlodipine Besylate] Other (See Comments)    Numbness to lips; made legs give out  . Penicillins Rash    Childhood reaction    Current Outpatient Prescriptions  Medication Sig Dispense Refill  . Ascorbic Acid (VITAMIN C) POWD Take 1 Dose by mouth daily.     . ASTRAGALUS PO Take 1 Dose by mouth daily.     . bisoprolol (ZEBETA) 5 MG tablet Take 0.5 tablets (2.5 mg total) by mouth daily. 90 tablet 0  . Calcium-Magnesium 500-250 MG TABS Take 1 tablet by mouth daily.     . carvedilol (COREG CR) 20 MG 24 hr capsule Take 1 capsule (20 mg total) by mouth daily. 90 capsule 3  . cetirizine (ZYRTEC) 10 MG tablet Take 10 mg by mouth daily.    . colchicine 0.6 MG tablet Take 0.6 mg by mouth daily as needed (for gout flare).    Marland Kitchen dextromethorphan-guaiFENesin (MUCINEX DM) 30-600 MG 12hr tablet Take 1 tablet by mouth 2 (two) times daily as needed for cough.     . digoxin (LANOXIN) 0.125 MG tablet TAKE 1 TABLET BY MOUTH EVERY OTHER DAY (KEEP OFFICE VISIT) (Patient taking differently: TAKE 1/2 TABLET BY MOUTH EVERY OTHER DAY (KEEP OFFICE VISIT)) 30 tablet 5  . diphenhydrAMINE (BENADRYL) 25 mg capsule Take 25 mg by mouth every 6 (six) hours as needed for allergies.     . fish oil-omega-3 fatty acids 1000 MG capsule Take 2 g by mouth daily.    . furosemide (LASIX) 20 MG tablet Take 1 tablet (20 mg total) by mouth daily. (Patient taking differently: Take 20 mg by mouth daily. TAKE 40 MG Monday, Wednesday, AND Friday THEN TAKE 20 MG ALL OTHER DAYS.) 90 tablet 3  . hydrALAZINE (APRESOLINE) 25 MG tablet Take 1 tablet (25 mg total) by mouth 2 (two) times daily. 180 tablet 0  . JANTOVEN 5 MG tablet TAKE 1 TO 1 AND 1/2 TABLETS BY MOUTH DAILY AS DIRECTED BY COUMADIN CLINIC (Patient taking differently: TAKE 5 MG EVERYDAY BUT TAKE 2.5 MG ON Saturday.) 135 tablet 3  . magnesium oxide (MAG-OX) 400 MG tablet Take 1 tablet (400 mg total) by mouth daily. 90 tablet 3  . Multiple Vitamin (MULTIVITAMIN WITH MINERALS) TABS Take 1 tablet by  mouth daily.    . nitroGLYCERIN (NITROSTAT) 0.4 MG SL tablet Place 0.4 mg under the tongue every 5 (five) minutes as needed for chest pain. (up to 3 doses) For chest pain    . ondansetron (ZOFRAN-ODT) 4 MG disintegrating tablet Take 4 mg by mouth every 4 (four) hours as needed for nausea.     Marland Kitchen spironolactone (ALDACTONE) 25 MG tablet TAKE 1/2 TABLET DAILY 45 tablet 2   No current facility-administered medications for this visit.     Past Medical History:  Diagnosis Date  . 6949-lead    replaced 02/2011  . Acute on chronic renal insufficiency   . AICD (automatic cardioverter/defibrillator) present    Medtronic CRT  . Asthma   . Atrial flutter ([place])    s/p TEE guided cardioversion  . CHF (congestive heart failure) ([place])   . Chronic drug-induced interstitial lung disorders ([place])   . COPD (chronic obstructive pulmonary disease) ([place])      " very MILD "  . Coronary artery disease    total occlusion of the right coronary artery, left to right collaterals.  He had Cypher stenting to the circumflex in  July 2003  . Family hx of colon cancer    2 SISTERS  . Fatty liver    CT  . Headache(784.0)   . Hyperlipemia   . Hypertension   . Hyponatremia   . Ischemic cardiomyopathy    Myoview 02/2011  EF 28%  Coronary artery disease  (total occlusion of the right coronary artery, left-to-right )  . LBBB (left bundle branch block)   . Nonsustained ventricular tachycardia ([place])   . Seizure disorder [facility])     Past Surgical History:  Procedure Laterality Date  . CORONARY ANGIOPLASTY    . EP IMPLANTABLE DEVICE N/A 03/05/2016   Procedure: ICD  Generator Changeout;  Surgeon: Deboraha Sprang, MD;  Location: [place] CV LAB;  Service: Cardiovascular;  Laterality: N/A;  . ESOPHAGOGASTRODUODENOSCOPY  09/13/2012   Procedure: ESOPHAGOGASTRODUODENOSCOPY (EGD);  Surgeon: Ladene Artist, MD,FACG;  Location: [facility];  Service: Endoscopy;  Laterality: N/A;  . HERNIA REPAIR    . INSERT / REPLACE / [place]  . RIGHT HEART CATHETERIZATION N/A 09/17/2012   Procedure: RIGHT HEART CATH;  Surgeon: Jolaine Artist, MD;  Location: [facility] CATH LAB;  Service: Cardiovascular;  Laterality: N/A;    ROS:  Nasal congestion.   Otherwise as stated in the HPI and negative for all other systems.  PHYSICAL EXAM BP 106/70   Pulse 78   Ht 5\' 8"  (1.727 m)   Wt 182 lb (82.6 kg)   BMI 27.67 kg/m   GENERAL:  Well appearing NECK:  No jugular venous distention, waveform within normal limits, carotid upstroke brisk and symmetric, no bruits, no thyromegaly LUNGS:  Clear to auscultation bilaterally BACK:  No CVA tenderness CHEST:  ICD pocket intact.  HEART:  PMI not displaced or sustained,S1 and S2 within normal limits, no S3, no S4, no clicks, no rubs, no murmurs ABD:  Flat, positive bowel sounds normal in frequency in pitch, no  bruits, no rebound, no guarding, no midline pulsatile mass, no hepatomegaly, no splenomegaly EXT:  2 plus pulses throughout, no edema, no cyanosis no clubbing   ASSESSMENT AND PLAN  Chronic Systolic CHF:    He's on an odd dose of combination of beta blockers.   However, he does well.  The patient denies any new symptoms.  No change in therapy is  planned.  He will remain off the ACE inhibitor.  I will reconsider starting this after the BMET.  I might stop the spironolactone instead  ICD:   He will have this interrogated today and I will review with the device clinic.    Coronary Artery Disease:   The patient has no new sypmtoms.  No further cardiovascular testing is indicated.  We will continue with aggressive risk reduction and meds as listed.  Hypertension:    This is being managed in the context of treating his CHF  Atrial Flutter:     The patient  tolerates anticoagulation. I will check a CBC.    Chronic Kidney Disease:    I will check a BMET today.   Hyponatremia:   As above.

## 2017-05-26 ENCOUNTER — Encounter: Payer: Self-pay | Admitting: Cardiology

## 2017-05-26 ENCOUNTER — Ambulatory Visit (INDEPENDENT_AMBULATORY_CARE_PROVIDER_SITE_OTHER): Payer: Medicare Other | Admitting: Cardiology

## 2017-05-26 VITALS — BP 106/70 | HR 78 | Ht 68.0 in | Wt 182.0 lb

## 2017-05-26 DIAGNOSIS — I5022 Chronic systolic (congestive) heart failure: Secondary | ICD-10-CM

## 2017-05-26 DIAGNOSIS — I2510 Atherosclerotic heart disease of native coronary artery without angina pectoris: Secondary | ICD-10-CM | POA: Diagnosis not present

## 2017-05-26 DIAGNOSIS — Z79899 Other long term (current) drug therapy: Secondary | ICD-10-CM | POA: Diagnosis not present

## 2017-05-26 DIAGNOSIS — N183 Chronic kidney disease, stage 3 unspecified: Secondary | ICD-10-CM

## 2017-05-26 DIAGNOSIS — I4892 Unspecified atrial flutter: Secondary | ICD-10-CM

## 2017-05-26 DIAGNOSIS — Z4502 Encounter for adjustment and management of automatic implantable cardiac defibrillator: Secondary | ICD-10-CM

## 2017-05-26 DIAGNOSIS — I255 Ischemic cardiomyopathy: Secondary | ICD-10-CM

## 2017-05-26 NOTE — Patient Instructions (Signed)
Medication Instructions:  Continue current medications  Labwork: CBC and BMP  Testing/Procedures: None Ordered  Follow-Up: Your physician wants you to follow-up in: 6 Months. You will receive a reminder letter in the mail two months in advance. If you don't receive a letter, please call our office to schedule the follow-up appointment.   Any Other Special Instructions Will Be Listed Below (If Applicable).   If you need a refill on your cardiac medications before your next appointment, please call your pharmacy.

## 2017-05-27 ENCOUNTER — Encounter: Payer: Self-pay | Admitting: Cardiology

## 2017-05-27 DIAGNOSIS — I2510 Atherosclerotic heart disease of native coronary artery without angina pectoris: Secondary | ICD-10-CM | POA: Insufficient documentation

## 2017-05-27 DIAGNOSIS — Z9861 Coronary angioplasty status: Secondary | ICD-10-CM

## 2017-05-27 DIAGNOSIS — N183 Chronic kidney disease, stage 3 unspecified: Secondary | ICD-10-CM | POA: Insufficient documentation

## 2017-05-27 LAB — CBC
Hematocrit: 39.5 % (ref 37.5–51.0)
Hemoglobin: 13.4 g/dL (ref 13.0–17.7)
MCH: 33.4 pg — ABNORMAL HIGH (ref 26.6–33.0)
MCHC: 33.9 g/dL (ref 31.5–35.7)
MCV: 99 fL — AB (ref 79–97)
PLATELETS: 275 10*3/uL (ref 150–379)
RBC: 4.01 x10E6/uL — ABNORMAL LOW (ref 4.14–5.80)
RDW: 14.4 % (ref 12.3–15.4)
WBC: 7.2 10*3/uL (ref 3.4–10.8)

## 2017-05-27 LAB — BASIC METABOLIC PANEL
BUN/Creatinine Ratio: 13 (ref 10–24)
BUN: 25 mg/dL (ref 8–27)
CALCIUM: 9.5 mg/dL (ref 8.6–10.2)
CHLORIDE: 99 mmol/L (ref 96–106)
CO2: 20 mmol/L (ref 20–29)
Creatinine, Ser: 1.91 mg/dL — ABNORMAL HIGH (ref 0.76–1.27)
GFR calc non Af Amer: 34 mL/min/{1.73_m2} — ABNORMAL LOW (ref >59)
GFR, EST AFRICAN AMERICAN: 40 mL/min/{1.73_m2} — AB (ref >59)
Glucose: 99 mg/dL (ref 65–99)
POTASSIUM: 5 mmol/L (ref 3.5–5.2)
Sodium: 137 mmol/L (ref 134–144)

## 2017-06-03 ENCOUNTER — Telehealth: Payer: Self-pay | Admitting: *Deleted

## 2017-06-03 DIAGNOSIS — Z79899 Other long term (current) drug therapy: Secondary | ICD-10-CM

## 2017-06-03 NOTE — Telephone Encounter (Signed)
BMP ordered and mailed to pt  Pt made aware to get done in 1 month

## 2017-06-03 NOTE — Telephone Encounter (Signed)
-----   Message from Minus Breeding, MD sent at 05/27/2017  9:04 AM EDT ----- Creat is still elevated slightly.  Continue with current meds and repeat BMET in one month.  Call Mr. Tarnow with the results and send results to [name], MD

## 2017-06-09 ENCOUNTER — Ambulatory Visit (INDEPENDENT_AMBULATORY_CARE_PROVIDER_SITE_OTHER): Payer: Medicare Other | Admitting: *Deleted

## 2017-06-09 DIAGNOSIS — Z5181 Encounter for therapeutic drug level monitoring: Secondary | ICD-10-CM | POA: Diagnosis not present

## 2017-06-09 DIAGNOSIS — I255 Ischemic cardiomyopathy: Secondary | ICD-10-CM

## 2017-06-09 DIAGNOSIS — I480 Paroxysmal atrial fibrillation: Secondary | ICD-10-CM | POA: Diagnosis not present

## 2017-06-09 LAB — POCT INR: INR: 2.5

## 2017-06-12 ENCOUNTER — Other Ambulatory Visit: Payer: Self-pay | Admitting: Cardiology

## 2017-06-15 ENCOUNTER — Encounter: Payer: Medicare Other | Admitting: *Deleted

## 2017-06-18 ENCOUNTER — Encounter: Payer: Self-pay | Admitting: Internal Medicine

## 2017-06-18 ENCOUNTER — Telehealth: Payer: Self-pay | Admitting: *Deleted

## 2017-06-18 ENCOUNTER — Ambulatory Visit (INDEPENDENT_AMBULATORY_CARE_PROVIDER_SITE_OTHER): Payer: Medicare Other | Admitting: Internal Medicine

## 2017-06-18 VITALS — BP 112/70 | HR 74 | Ht 68.0 in | Wt 186.0 lb

## 2017-06-18 DIAGNOSIS — I259 Chronic ischemic heart disease, unspecified: Secondary | ICD-10-CM

## 2017-06-18 DIAGNOSIS — I482 Chronic atrial fibrillation: Secondary | ICD-10-CM

## 2017-06-18 DIAGNOSIS — I5022 Chronic systolic (congestive) heart failure: Secondary | ICD-10-CM | POA: Diagnosis not present

## 2017-06-18 DIAGNOSIS — Z95810 Presence of automatic (implantable) cardiac defibrillator: Secondary | ICD-10-CM

## 2017-06-18 DIAGNOSIS — I4821 Permanent atrial fibrillation: Secondary | ICD-10-CM

## 2017-06-18 LAB — CUP PACEART INCLINIC DEVICE CHECK
Date Time Interrogation Session: 20180802163403
Implantable Lead Implant Date: 20071010
Implantable Lead Implant Date: 20071010
Implantable Lead Location: 753858
Implantable Lead Model: 7121
MDC IDC LEAD IMPLANT DT: 20120419
MDC IDC LEAD LOCATION: 753859
MDC IDC LEAD LOCATION: 753860
MDC IDC PG IMPLANT DT: 20170419

## 2017-06-18 NOTE — Progress Notes (Signed)
Patient Care Team: Nona Dell, Corene Cornea, MD as PCP - General (Specialist)   HPI  [name] is a 71 y.o. male Seen in followup for CRT-D implanted for congestive heart failure in the setting of ischemic heart disease. Hospitalization 2013 had required milrinone He underwent generator replacement April 2012 with replacement of his 6949 ICD lead and again 4/17.  He has permanent atrial fibrillation. Rate control is adequate.   He is followed by [facility] and Heart Faliure clinic(remotely)  .  Functional status has been stable. He has modest exercise intolerance with dyspnea. He has not had chest pain. He has mild peripheral edema but no orthopnea or nocturnal dyspnea.  Recent echocardiogram has demonstrated interval improvement in LVEF  Declining in renal function prompted the discontinuation of lisinopril. He remains on Aldactone.  DATE TEST    4/12    Myoview   EF 28 %   2014 Echo   EF 25 %   1/17 Echo  EF 35-40       Date Cr/GFR  K Hgb Dig  4/17  1.46 4.3     7//18  1.9/34 5.0 13.4 <0.5 (4/18)       Past Medical History:  Diagnosis Date  . 6949-lead    replaced 02/2011  . Acute on chronic renal insufficiency   . AICD (automatic cardioverter/defibrillator) present    Medtronic CRT  . Asthma   . Atrial flutter ([place])    s/p TEE guided cardioversion  . CHF (congestive heart failure) ([place])   . Chronic drug-induced interstitial lung disorders ([place])   . COPD (chronic obstructive pulmonary disease) ([place])    " very MILD "  . Coronary artery disease    total occlusion of the right coronary artery, left to right collaterals.  He had Cypher stenting to the circumflex in  July 2003  . Family hx of colon cancer    2 SISTERS  . Fatty liver    CT  . Headache(784.0)   . Hyperlipemia   . Hypertension   . Hyponatremia   . Ischemic cardiomyopathy    Myoview 02/2011  EF 28%  Coronary artery disease  (total occlusion of the right coronary artery, left-to-right )  . LBBB (left  bundle branch block)   . Nonsustained ventricular tachycardia ([place])   . Seizure disorder [facility])     Past Surgical History:  Procedure Laterality Date  . CORONARY ANGIOPLASTY    . EP IMPLANTABLE DEVICE N/A 03/05/2016   Procedure: ICD  Generator Changeout;  Surgeon: Deboraha Sprang, MD;  Location: [place] CV LAB;  Service: Cardiovascular;  Laterality: N/A;  . ESOPHAGOGASTRODUODENOSCOPY  09/13/2012   Procedure: ESOPHAGOGASTRODUODENOSCOPY (EGD);  Surgeon: Ladene Artist, MD,FACG;  Location: [facility];  Service: Endoscopy;  Laterality: N/A;  . HERNIA REPAIR    . INSERT / REPLACE / [place]  . RIGHT HEART CATHETERIZATION N/A 09/17/2012   Procedure: RIGHT HEART CATH;  Surgeon: Jolaine Artist, MD;  Location: [facility] CATH LAB;  Service: Cardiovascular;  Laterality: N/A;    Current Outpatient Prescriptions  Medication Sig Dispense Refill  . Ascorbic Acid (VITAMIN C) POWD Take 1 Dose by mouth daily.     . ASTRAGALUS PO Take 1 Dose by mouth daily.     . bisoprolol (ZEBETA) 5 MG tablet Take 0.5 tablets (2.5 mg total) by mouth daily. 90 tablet 0  . Calcium-Magnesium 500-250 MG TABS Take 1 tablet by mouth daily.     Marland Kitchen  carvedilol (COREG CR) 20 MG 24 hr capsule Take 1 capsule (20 mg total) by mouth daily. 90 capsule 3  . cetirizine (ZYRTEC) 10 MG tablet Take 10 mg by mouth daily.    . colchicine 0.6 MG tablet Take 0.6 mg by mouth daily as needed (for gout flare).    Marland Kitchen dextromethorphan-guaiFENesin (MUCINEX DM) 30-600 MG 12hr tablet Take 1 tablet by mouth 2 (two) times daily as needed for cough.    . digoxin (LANOXIN) 0.125 MG tablet TAKE 1 TABLET BY MOUTH EVERY OTHER DAY (KEEP OFFICE VISIT) (Patient taking differently: TAKE 1/2 TABLET BY MOUTH EVERY OTHER DAY (KEEP OFFICE VISIT)) 30 tablet 5  . diphenhydrAMINE (BENADRYL) 25 mg capsule Take 25 mg by mouth every 6 (six) hours as needed for allergies.     . fish oil-omega-3 fatty acids 1000 MG capsule Take 2 g by mouth daily.     . furosemide (LASIX) 20 MG tablet Take 2 tablets (40 mg) by mouth on Mon., Wed., and Fri. Take 1 tablet (20mg ) by mouth on Tues., Thurs,. Sat., and Sun.    . hydrALAZINE (APRESOLINE) 25 MG tablet Take 1 tablet (25 mg total) by mouth 2 (two) times daily. 180 tablet 0  . JANTOVEN 5 MG tablet TAKE 1 TO 1 AND 1/2 TABLETS BY MOUTH DAILY AS DIRECTED BY COUMADIN CLINIC (Patient taking differently: TAKE 5 MG EVERYDAY BUT TAKE 2.5 MG ON Saturday.) 135 tablet 3  . magnesium oxide (MAG-OX) 400 MG tablet Take 1 tablet (400 mg total) by mouth daily. 90 tablet 3  . Multiple Vitamin (MULTIVITAMIN WITH MINERALS) TABS Take 1 tablet by mouth daily.    . nitroGLYCERIN (NITROSTAT) 0.4 MG SL tablet Place 0.4 mg under the tongue every 5 (five) minutes as needed for chest pain. (up to 3 doses) For chest pain    . ondansetron (ZOFRAN-ODT) 4 MG disintegrating tablet Take 4 mg by mouth every 4 (four) hours as needed for nausea.     Marland Kitchen spironolactone (ALDACTONE) 25 MG tablet TAKE 1/2 TABLET DAILY 45 tablet 2  . spironolactone (ALDACTONE) 25 MG tablet TAKE 1/2 TABLET DAILY 45 tablet 3   No current facility-administered medications for this visit.     Allergies  Allergen Reactions  . Sulfa Antibiotics Shortness Of Breath  . Ace Inhibitors     Unknown  . Corzide [Nadolol-Bendroflumethiazide]     Unknown  . Diovan [Valsartan]     Unknown  . Flagyl [Metronidazole] Other (See Comments)    Unknown  . Omeprazole     Shut my kidneys down  . Other     Perfumes smells; paint smells; formaldehyde smells  . Shellfish Allergy   . Tape Other (See Comments)  . Amlodipine Besylate Other (See Comments)    Numbness to lips; made legs give out  . Codeine Itching and Rash    Itching  . Latex Itching and Rash  . Norvasc [Amlodipine Besylate] Other (See Comments)    Numbness to lips; made legs give out  . Penicillins Rash    Childhood reaction    Review of Systems negative except from HPI and PMH  Physical Exam BP  112/70   Pulse 74   Ht 5\' 8"  (1.727 m)   Wt 186 lb (84.4 kg)   SpO2 98%   BMI 28.28 kg/m  Well developed and well nourished in no acute distress HENT normal E scleral and icterus clear Neck Supple JVP flat; carotids brisk and full Clear to ausculation  Regular rate and  rhythm, no murmurs gallops or rub Soft with active bowel sounds No clubbing cyanosis  Edema Alert and oriented, grossly normal motor and sensory function Skin Warm and little bit damp    Atrial fibrillation at 68 Intervals-/14/43 Intermittent ventricular pacing   A second 12-lead was undertaken with VVI pacing at 40. This demonstrates left bundle branch block is a QRS duration of 176 ms. There also PVCs. Approaching 10% increase.  Assessment and  Plan  Atrial fibrillation-permanent  Ischemic heart disease   Implantable defibrillator-CRT-Medtronic The patient's device was interrogated.  The information was reviewed. No changes were made in the programming.     Congestive heart failure-chronic  PVCs  Renal insufficiency grade 3   He is about 75% ventricularly paced with a significant percentage of V sensed response. We again reviewed options including Augmented nodal blockade. He is reluctant to change medicines. We discussed AV ablation, and he is not interested in not having his own heartbeat. We compromised on increasing his ventricular floor rate to 70 bpm which resulted in a significant visual reduction in his ventricular sensed events. This may well turn into augmented cardiac performance.  Euvolemic continue current meds  On Anticoagulation;  No bleeding issues   We reviewed his renal testing in the increase in his creatinine. His GFR is approaching the limits of Aldactone therapy. Perhaps renal perfusion will be improved with improved cardiac performance from increased CRT  More than 50% of 40 min was spent in counseling related to the above

## 2017-06-18 NOTE — Patient Instructions (Addendum)
Medication Instructions:  Your physician recommends that you continue on your current medications as directed. Please refer to the Current Medication list given to you today.  --- If you need a refill on your cardiac medications before your next appointment, please call your pharmacy. ---  Labwork: None ordered  Testing/Procedures: None ordered  Follow-Up: Remote monitoring is used to monitor your Pacemaker of ICD from home. This monitoring reduces the number of office visits required to check your device to one time per year. It allows [place] to keep an eye on the functioning of your device to ensure it is working properly. You are scheduled for a device check from home on 09/17/2017. You may send your transmission at any time that day. If you have a wireless device, the transmission will be sent automatically. After your physician reviews your transmission, you will receive a postcard with your next transmission date.  Your physician wants you to follow-up in: 1 year with Dr. Caryl Comes.  You will receive a reminder letter in the mail two months in advance. If you don't receive a letter, please call our office to schedule the follow-up appointment.   Thank you for choosing [facility]!!

## 2017-06-18 NOTE — Telephone Encounter (Signed)
Spoke with pt letting him know Lab slip was mailed to his house, and he need to about blood work around August 10th.. Pt voice understanding

## 2017-06-18 NOTE — Telephone Encounter (Signed)
-----   Message from Stanton Kidney, RN sent at 06/18/2017  3:22 PM EDT ----- Regarding: Lab work Pt here today seeing Dr. Caryl Comes.  He asked about blood work for Dr. Percival Spanish.  Informed pt that I would send you a message and have you call and follow up with the patient about blood work needed.  thx Trinidad Curet, RN

## 2017-06-30 ENCOUNTER — Other Ambulatory Visit: Payer: Medicare Other

## 2017-06-30 DIAGNOSIS — Z79899 Other long term (current) drug therapy: Secondary | ICD-10-CM | POA: Diagnosis not present

## 2017-07-01 LAB — BASIC METABOLIC PANEL
BUN/Creatinine Ratio: 13 (ref 10–24)
BUN: 17 mg/dL (ref 8–27)
CO2: 23 mmol/L (ref 20–29)
Calcium: 9.3 mg/dL (ref 8.6–10.2)
Chloride: 99 mmol/L (ref 96–106)
Creatinine, Ser: 1.33 mg/dL — ABNORMAL HIGH (ref 0.76–1.27)
GFR, EST AFRICAN AMERICAN: 61 mL/min/{1.73_m2} (ref >59)
GFR, EST NON AFRICAN AMERICAN: 53 mL/min/{1.73_m2} — AB (ref >59)
Glucose: 99 mg/dL (ref 65–99)
POTASSIUM: 4.8 mmol/L (ref 3.5–5.2)
SODIUM: 135 mmol/L (ref 134–144)

## 2017-07-22 ENCOUNTER — Ambulatory Visit (INDEPENDENT_AMBULATORY_CARE_PROVIDER_SITE_OTHER): Payer: Medicare Other | Admitting: Pharmacist

## 2017-07-22 DIAGNOSIS — Z5181 Encounter for therapeutic drug level monitoring: Secondary | ICD-10-CM | POA: Diagnosis not present

## 2017-07-22 DIAGNOSIS — I480 Paroxysmal atrial fibrillation: Secondary | ICD-10-CM | POA: Diagnosis not present

## 2017-07-22 DIAGNOSIS — I4821 Permanent atrial fibrillation: Secondary | ICD-10-CM

## 2017-07-22 DIAGNOSIS — I482 Chronic atrial fibrillation: Secondary | ICD-10-CM | POA: Diagnosis not present

## 2017-07-22 LAB — POCT INR: INR: 3.1

## 2017-08-11 ENCOUNTER — Other Ambulatory Visit: Payer: Self-pay | Admitting: Cardiology

## 2017-08-24 ENCOUNTER — Other Ambulatory Visit: Payer: Self-pay | Admitting: Cardiology

## 2017-08-26 ENCOUNTER — Ambulatory Visit (INDEPENDENT_AMBULATORY_CARE_PROVIDER_SITE_OTHER): Payer: Medicare Other | Admitting: *Deleted

## 2017-08-26 DIAGNOSIS — I482 Chronic atrial fibrillation: Secondary | ICD-10-CM

## 2017-08-26 DIAGNOSIS — Z5181 Encounter for therapeutic drug level monitoring: Secondary | ICD-10-CM | POA: Diagnosis not present

## 2017-08-26 DIAGNOSIS — I480 Paroxysmal atrial fibrillation: Secondary | ICD-10-CM | POA: Diagnosis not present

## 2017-08-26 DIAGNOSIS — I4821 Permanent atrial fibrillation: Secondary | ICD-10-CM

## 2017-08-26 LAB — POCT INR: INR: 2.4

## 2017-09-08 ENCOUNTER — Other Ambulatory Visit: Payer: Self-pay

## 2017-09-08 MED ORDER — CARVEDILOL PHOSPHATE ER 20 MG PO CP24: 20.0000 mg | ORAL_CAPSULE | Freq: Every day | ORAL | 3 refills | Status: DC

## 2017-09-10 ENCOUNTER — Other Ambulatory Visit: Payer: Self-pay | Admitting: Pharmacist Clinician (PhC)/ Clinical Pharmacy Specialist

## 2017-09-10 MED ORDER — JANTOVEN 5 MG PO TABS: ORAL_TABLET | ORAL | 1 refills | Status: DC

## 2017-09-11 ENCOUNTER — Other Ambulatory Visit: Payer: Self-pay

## 2017-09-11 MED ORDER — CARVEDILOL PHOSPHATE ER 20 MG PO CP24: 20.0000 mg | ORAL_CAPSULE | Freq: Every day | ORAL | 3 refills | Status: DC

## 2017-09-17 ENCOUNTER — Ambulatory Visit (INDEPENDENT_AMBULATORY_CARE_PROVIDER_SITE_OTHER): Payer: Medicare Other | Admitting: *Deleted

## 2017-09-17 DIAGNOSIS — I5022 Chronic systolic (congestive) heart failure: Secondary | ICD-10-CM

## 2017-09-18 ENCOUNTER — Encounter: Payer: Self-pay | Admitting: Cardiology

## 2017-09-18 NOTE — Progress Notes (Signed)
Remote ICD transmission.   

## 2017-10-06 ENCOUNTER — Ambulatory Visit (INDEPENDENT_AMBULATORY_CARE_PROVIDER_SITE_OTHER): Payer: Medicare Other | Admitting: *Deleted

## 2017-10-06 DIAGNOSIS — I480 Paroxysmal atrial fibrillation: Secondary | ICD-10-CM

## 2017-10-06 DIAGNOSIS — Z5181 Encounter for therapeutic drug level monitoring: Secondary | ICD-10-CM

## 2017-10-06 LAB — POCT INR: INR: 3.4

## 2017-10-06 NOTE — Patient Instructions (Signed)
Skip tomorrow's dosage, then Continue on same dosage 1 tablet daily except 1.5 tablets on Saturdays. Recheck INR  in 4 weeks. Call us with any changes # [phone].

## 2017-10-14 LAB — CUP PACEART REMOTE DEVICE CHECK
Battery Remaining Longevity: 82 mo
Brady Statistic AP VS Percent: 0 %
Brady Statistic AS VP Percent: 89.73 %
Brady Statistic AS VS Percent: 10.27 %
Date Time Interrogation Session: 20181101193416
HIGH POWER IMPEDANCE MEASURED VALUE: 50 Ohm
HIGH POWER IMPEDANCE MEASURED VALUE: 63 Ohm
Implantable Lead Implant Date: 20071010
Implantable Lead Implant Date: 20120419
Implantable Lead Location: 753858
Implantable Lead Location: 753860
Implantable Pulse Generator Implant Date: 20170419
Lead Channel Impedance Value: 361 Ohm
Lead Channel Impedance Value: 456 Ohm
Lead Channel Impedance Value: 532 Ohm
Lead Channel Impedance Value: 589 Ohm
Lead Channel Impedance Value: 817 Ohm
Lead Channel Pacing Threshold Amplitude: 1.125 V
Lead Channel Pacing Threshold Pulse Width: 0.4 ms
Lead Channel Pacing Threshold Pulse Width: 0.4 ms
Lead Channel Sensing Intrinsic Amplitude: 1.625 mV
Lead Channel Sensing Intrinsic Amplitude: 1.625 mV
Lead Channel Sensing Intrinsic Amplitude: 14.25 mV
Lead Channel Setting Pacing Amplitude: 2.25 V
Lead Channel Setting Pacing Amplitude: 2.5 V
MDC IDC LEAD IMPLANT DT: 20071010
MDC IDC LEAD LOCATION: 753859
MDC IDC MSMT BATTERY VOLTAGE: 2.96 V
MDC IDC MSMT LEADCHNL RA IMPEDANCE VALUE: 418 Ohm
MDC IDC MSMT LEADCHNL RV PACING THRESHOLD AMPLITUDE: 0.625 V
MDC IDC MSMT LEADCHNL RV SENSING INTR AMPL: 14.25 mV
MDC IDC SET LEADCHNL LV PACING PULSEWIDTH: 0.4 ms
MDC IDC SET LEADCHNL RV PACING PULSEWIDTH: 0.4 ms
MDC IDC SET LEADCHNL RV SENSING SENSITIVITY: 0.3 mV
MDC IDC STAT BRADY AP VP PERCENT: 0 %
MDC IDC STAT BRADY RA PERCENT PACED: 0 %
MDC IDC STAT BRADY RV PERCENT PACED: 91.66 %

## 2017-11-02 ENCOUNTER — Other Ambulatory Visit: Payer: Self-pay

## 2017-11-04 ENCOUNTER — Ambulatory Visit (INDEPENDENT_AMBULATORY_CARE_PROVIDER_SITE_OTHER): Payer: Medicare Other

## 2017-11-04 DIAGNOSIS — I480 Paroxysmal atrial fibrillation: Secondary | ICD-10-CM | POA: Diagnosis not present

## 2017-11-04 DIAGNOSIS — Z5181 Encounter for therapeutic drug level monitoring: Secondary | ICD-10-CM | POA: Diagnosis not present

## 2017-11-04 LAB — POCT INR: INR: 3

## 2017-11-04 NOTE — Patient Instructions (Signed)
Continue on same dosage 1 tablet daily except 1.5 tablets on Saturdays. Recheck INR  in 4 weeks. Call us with any changes # [phone].

## 2017-11-11 ENCOUNTER — Other Ambulatory Visit: Payer: Self-pay | Admitting: Cardiology

## 2017-11-18 DIAGNOSIS — J00 Acute nasopharyngitis [common cold]: Secondary | ICD-10-CM | POA: Diagnosis not present

## 2017-11-26 DIAGNOSIS — J209 Acute bronchitis, unspecified: Secondary | ICD-10-CM | POA: Diagnosis not present

## 2017-12-08 ENCOUNTER — Ambulatory Visit (INDEPENDENT_AMBULATORY_CARE_PROVIDER_SITE_OTHER): Payer: PPO | Admitting: *Deleted

## 2017-12-08 DIAGNOSIS — I480 Paroxysmal atrial fibrillation: Secondary | ICD-10-CM | POA: Diagnosis not present

## 2017-12-08 DIAGNOSIS — Z5181 Encounter for therapeutic drug level monitoring: Secondary | ICD-10-CM

## 2017-12-08 LAB — POCT INR: INR: 2.1

## 2017-12-08 NOTE — Patient Instructions (Addendum)
Description   Continue on same dosage 1 tablet daily except 1.5 tablets on Saturdays. Recheck INR  in 5  weeks. Call us with any changes # [phone].

## 2017-12-12 NOTE — Progress Notes (Signed)
HPI   The patient presents for followup of his cardiomyopathy. Since I last saw him he saw Dr Caryl Comes.  He had his ventricluar pacing rate to increase the BiV pacing.  He actually is doing relatively well.  The patient denies any new symptoms such as chest discomfort, neck or arm discomfort. There has been no new shortness of breath, PND or orthopnea. There have been no reported palpitations, presyncope or syncope.  He did have URI for much of this month with a cough and congestion and he followed with his PCP but this has finally cleared up.  He has lost 10 lbs because of being ill and dieting.   Allergies  Allergen Reactions  . Sulfa Antibiotics Shortness Of Breath  . Ace Inhibitors     Unknown  . Corzide [Nadolol-Bendroflumethiazide]     Unknown  . Diovan [Valsartan]     Unknown  . Flagyl [Metronidazole] Other (See Comments)    Unknown  . Omeprazole     Shut my kidneys down  . Other     Perfumes smells; paint smells; formaldehyde smells  . Shellfish Allergy   . Tape Other (See Comments)  . Amlodipine Besylate Other (See Comments)    Numbness to lips; made legs give out  . Codeine Itching and Rash    Itching  . Latex Itching and Rash  . Norvasc [Amlodipine Besylate] Other (See Comments)    Numbness to lips; made legs give out  . Penicillins Rash    Childhood reaction    Current Outpatient Medications  Medication Sig Dispense Refill  . Ascorbic Acid (VITAMIN C) POWD Take 1 Dose by mouth daily.     . ASTRAGALUS PO Take 1 Dose by mouth daily.     . bisoprolol (ZEBETA) 5 MG tablet TAKE 1/2 TABLET BY MOUTH DAILY 45 tablet 1  . Calcium-Magnesium 500-250 MG TABS Take 1 tablet by mouth daily.     . carvedilol (COREG CR) 20 MG 24 hr capsule Take 1 capsule (20 mg total) by mouth daily. 90 capsule 3  . cetirizine (ZYRTEC) 10 MG tablet Take 10 mg by mouth daily.    . colchicine 0.6 MG tablet Take 0.6 mg by mouth daily as needed (for gout flare).    Marland Kitchen dextromethorphan-guaiFENesin  (MUCINEX DM) 30-600 MG 12hr tablet Take 1 tablet by mouth 2 (two) times daily as needed for cough.    . digoxin (LANOXIN) 0.125 MG tablet TAKE 1 TABLET BY MOUTH EVERY OTHER DAY (KEEP OFFICE VISIT) (Patient taking differently: TAKE 1/2 TABLET BY MOUTH EVERY OTHER DAY (KEEP OFFICE VISIT)) 30 tablet 5  . diphenhydrAMINE (BENADRYL) 25 mg capsule Take 25 mg by mouth every 6 (six) hours as needed for allergies.     . fish oil-omega-3 fatty acids 1000 MG capsule Take 2 g by mouth daily.    . furosemide (LASIX) 20 MG tablet Take 2 tablets (40 mg) by mouth on Mon., Wed., and Fri. Take 1 tablet (20mg ) by mouth on Tues., Thurs,. Sat., and Sun.    . hydrALAZINE (APRESOLINE) 25 MG tablet Take 1 tablet (25 mg total) by mouth 2 (two) times daily. 180 tablet 2  . JANTOVEN 5 MG tablet TAKE 1 TO 1 AND 1/2 TABLETS BY MOUTH DAILY AS DIRECTED BY COUMADIN CLINIC 135 tablet 1  . magnesium oxide (MAG-OX) 400 MG tablet Take 1 tablet (400 mg total) by mouth daily. 90 tablet 3  . montelukast (SINGULAIR) 10 MG tablet Take 1 tablet by mouth daily.  3  . Multiple Vitamin (MULTIVITAMIN WITH MINERALS) TABS Take 1 tablet by mouth daily.    . nitroGLYCERIN (NITROSTAT) 0.4 MG SL tablet Place 0.4 mg under the tongue every 5 (five) minutes as needed for chest pain. (up to 3 doses) For chest pain    . ondansetron (ZOFRAN-ODT) 4 MG disintegrating tablet Take 4 mg by mouth every 4 (four) hours as needed for nausea.     Marland Kitchen spironolactone (ALDACTONE) 25 MG tablet TAKE 1/2 TABLET DAILY 45 tablet 3  . traMADol (ULTRAM) 50 MG tablet Take 50 mg by mouth every 6 (six) hours as needed.     No current facility-administered medications for this visit.     Past Medical History:  Diagnosis Date  . 6949-lead    replaced 02/2011  . Acute on chronic renal insufficiency   . AICD (automatic cardioverter/defibrillator) present    Medtronic CRT  . Asthma   . Atrial flutter ([place])    s/p TEE guided cardioversion  . CHF (congestive heart failure)  ([place])   . Chronic drug-induced interstitial lung disorders ([place])   . COPD (chronic obstructive pulmonary disease) ([place])    " very MILD "  . Coronary artery disease    total occlusion of the right coronary artery, left to right collaterals.  He had Cypher stenting to the circumflex in  July 2003  . Family hx of colon cancer    2 SISTERS  . Fatty liver    CT  . Headache(784.0)   . Hyperlipemia   . Hypertension   . Hyponatremia   . Ischemic cardiomyopathy    Myoview 02/2011  EF 28%  Coronary artery disease  (total occlusion of the right coronary artery, left-to-right )  . LBBB (left bundle branch block)   . Nonsustained ventricular tachycardia ([place])   . Seizure disorder [facility])     Past Surgical History:  Procedure Laterality Date  . CORONARY ANGIOPLASTY    . EP IMPLANTABLE DEVICE N/A 03/05/2016   Procedure: ICD  Generator Changeout;  Surgeon: Deboraha Sprang, MD;  Location: [place] CV LAB;  Service: Cardiovascular;  Laterality: N/A;  . ESOPHAGOGASTRODUODENOSCOPY  09/13/2012   Procedure: ESOPHAGOGASTRODUODENOSCOPY (EGD);  Surgeon: Ladene Artist, MD,FACG;  Location: [facility];  Service: Endoscopy;  Laterality: N/A;  . HERNIA REPAIR    . INSERT / REPLACE / [place]  . RIGHT HEART CATHETERIZATION N/A 09/17/2012   Procedure: RIGHT HEART CATH;  Surgeon: Jolaine Artist, MD;  [facility] CATH LAB;  Service: Cardiovascular;  Laterality: N/A;    ROS:  Nasal congestion.   Otherwise as stated in the HPI and negative for all other systems.  PHYSICAL EXAM BP 122/78   Pulse 88   Ht 5\' 8"  (1.727 m)   Wt 176 lb (79.8 kg)   SpO2 98%   BMI 26.76 kg/m   GENERAL:  Well appearing NECK:  No jugular venous distention, waveform within normal limits, carotid upstroke brisk and symmetric, no bruits, no thyromegaly LUNGS:  Clear to auscultation bilaterally CHEST:  ICD pocket intact HEART:  PMI not displaced or sustained,S1 and S2 within normal limits, no S3, no  S4, no clicks, no rubs, no murmurs ABD:  Flat, positive bowel sounds normal in frequency in pitch, no bruits, no rebound, no guarding, no midline pulsatile mass, no hepatomegaly, no splenomegaly EXT:  2 plus pulses throughout, no edema, no cyanosis no clubbing   EKG:  NA  ASSESSMENT AND PLAN  Chronic  Systolic CHF:    He's on an odd dose of combination of beta blockers.   However, is doing well.  At this point I will follow with a BMET but I am not anticipating need for any med changes.    ICD:   He is up to date with follow up and will see Dr. Caryl Comes in the summer.   Coronary Artery Disease:   The patient has no new sypmtoms.  No further cardiovascular testing is indicated.  We will continue with aggressive risk reduction and meds as listed.  Hypertension:   The blood pressure is at target. No change in medications is indicated. We will continue with therapeutic lifestyle changes (TLC).  Atrial Flutter:    He tolerates anticoagulation.  I will check a CBC.   Chronic Kidney Disease:     Creat was stable in May of 2018.  I will check a BMET.

## 2017-12-14 ENCOUNTER — Ambulatory Visit (INDEPENDENT_AMBULATORY_CARE_PROVIDER_SITE_OTHER): Payer: PPO | Admitting: Cardiology

## 2017-12-14 ENCOUNTER — Encounter (INDEPENDENT_AMBULATORY_CARE_PROVIDER_SITE_OTHER): Payer: Self-pay

## 2017-12-14 ENCOUNTER — Encounter: Payer: Self-pay | Admitting: Cardiology

## 2017-12-14 VITALS — BP 122/78 | HR 88 | Ht 68.0 in | Wt 176.0 lb

## 2017-12-14 DIAGNOSIS — I5022 Chronic systolic (congestive) heart failure: Secondary | ICD-10-CM | POA: Diagnosis not present

## 2017-12-14 DIAGNOSIS — I10 Essential (primary) hypertension: Secondary | ICD-10-CM

## 2017-12-14 DIAGNOSIS — I2510 Atherosclerotic heart disease of native coronary artery without angina pectoris: Secondary | ICD-10-CM | POA: Diagnosis not present

## 2017-12-14 DIAGNOSIS — I483 Typical atrial flutter: Secondary | ICD-10-CM

## 2017-12-14 DIAGNOSIS — Z79899 Other long term (current) drug therapy: Secondary | ICD-10-CM

## 2017-12-14 NOTE — Patient Instructions (Addendum)
Medication Instructions:  Continue current medications  If you need a refill on your cardiac medications before your next appointment, please call your pharmacy.  Labwork: CBC and BMP  Testing/Procedures: None Ordered   Follow-Up: Your physician wants you to follow-up in: 6 Months. You should receive a reminder letter in the mail two months in advance. If you do not receive a letter, please call our office [phone].    Thank you for choosing [facility] at [facility]!!

## 2017-12-15 LAB — BASIC METABOLIC PANEL
BUN / CREAT RATIO: 20 (ref 10–24)
BUN: 28 mg/dL — AB (ref 8–27)
CALCIUM: 9.6 mg/dL (ref 8.6–10.2)
CHLORIDE: 97 mmol/L (ref 96–106)
CO2: 25 mmol/L (ref 20–29)
CREATININE: 1.41 mg/dL — AB (ref 0.76–1.27)
GFR calc Af Amer: 57 mL/min/{1.73_m2} — ABNORMAL LOW (ref >59)
GFR calc non Af Amer: 49 mL/min/{1.73_m2} — ABNORMAL LOW (ref >59)
GLUCOSE: 97 mg/dL (ref 65–99)
Potassium: 5.4 mmol/L — ABNORMAL HIGH (ref 3.5–5.2)
Sodium: 136 mmol/L (ref 134–144)

## 2017-12-15 LAB — CBC
HEMATOCRIT: 42.7 % (ref 37.5–51.0)
Hemoglobin: 14.3 g/dL (ref 13.0–17.7)
MCH: 33.9 pg — ABNORMAL HIGH (ref 26.6–33.0)
MCHC: 33.5 g/dL (ref 31.5–35.7)
MCV: 101 fL — AB (ref 79–97)
Platelets: 233 10*3/uL (ref 150–379)
RBC: 4.22 x10E6/uL (ref 4.14–5.80)
RDW: 14.4 % (ref 12.3–15.4)
WBC: 8.3 10*3/uL (ref 3.4–10.8)

## 2017-12-17 ENCOUNTER — Ambulatory Visit (INDEPENDENT_AMBULATORY_CARE_PROVIDER_SITE_OTHER): Payer: PPO | Admitting: *Deleted

## 2017-12-17 DIAGNOSIS — I255 Ischemic cardiomyopathy: Secondary | ICD-10-CM | POA: Diagnosis not present

## 2017-12-17 NOTE — Progress Notes (Signed)
Remote ICD transmission.   

## 2017-12-18 ENCOUNTER — Encounter: Payer: Self-pay | Admitting: Cardiology

## 2017-12-23 DIAGNOSIS — L820 Inflamed seborrheic keratosis: Secondary | ICD-10-CM | POA: Diagnosis not present

## 2018-01-01 LAB — CUP PACEART REMOTE DEVICE CHECK
Battery Remaining Longevity: 69 mo
Battery Voltage: 2.97 V
Brady Statistic AP VS Percent: 0 %
Brady Statistic AS VS Percent: 9.16 %
Brady Statistic RV Percent Paced: 92.43 %
Date Time Interrogation Session: 20190131094225
HIGH POWER IMPEDANCE MEASURED VALUE: 47 Ohm
HighPow Impedance: 59 Ohm
Implantable Lead Location: 753858
Implantable Lead Model: 5076
Implantable Pulse Generator Implant Date: 20170419
Lead Channel Impedance Value: 418 Ohm
Lead Channel Pacing Threshold Amplitude: 0.5 V
Lead Channel Pacing Threshold Amplitude: 1 V
Lead Channel Pacing Threshold Pulse Width: 0.4 ms
Lead Channel Setting Pacing Amplitude: 2 V
Lead Channel Setting Pacing Pulse Width: 0.4 ms
MDC IDC LEAD IMPLANT DT: 20071010
MDC IDC LEAD IMPLANT DT: 20071010
MDC IDC LEAD IMPLANT DT: 20120419
MDC IDC LEAD LOCATION: 753859
MDC IDC LEAD LOCATION: 753860
MDC IDC MSMT LEADCHNL LV IMPEDANCE VALUE: 342 Ohm
MDC IDC MSMT LEADCHNL LV IMPEDANCE VALUE: 532 Ohm
MDC IDC MSMT LEADCHNL LV IMPEDANCE VALUE: 722 Ohm
MDC IDC MSMT LEADCHNL LV PACING THRESHOLD PULSEWIDTH: 0.4 ms
MDC IDC MSMT LEADCHNL RA SENSING INTR AMPL: 1 mV
MDC IDC MSMT LEADCHNL RA SENSING INTR AMPL: 1 mV
MDC IDC MSMT LEADCHNL RV IMPEDANCE VALUE: 399 Ohm
MDC IDC MSMT LEADCHNL RV IMPEDANCE VALUE: 475 Ohm
MDC IDC MSMT LEADCHNL RV SENSING INTR AMPL: 16.625 mV
MDC IDC MSMT LEADCHNL RV SENSING INTR AMPL: 16.625 mV
MDC IDC SET LEADCHNL RV PACING AMPLITUDE: 2.5 V
MDC IDC SET LEADCHNL RV PACING PULSEWIDTH: 0.4 ms
MDC IDC SET LEADCHNL RV SENSING SENSITIVITY: 0.3 mV
MDC IDC STAT BRADY AP VP PERCENT: 0 %
MDC IDC STAT BRADY AS VP PERCENT: 90.84 %
MDC IDC STAT BRADY RA PERCENT PACED: 0 %

## 2018-01-05 LAB — CUP PACEART INCLINIC DEVICE CHECK
Battery Remaining Longevity: 95 mo
Battery Voltage: 2.97 V
Brady Statistic AP VP Percent: 0 %
Brady Statistic AP VS Percent: 0 %
Brady Statistic AS VS Percent: 21.47 %
Brady Statistic RA Percent Paced: 0 %
Brady Statistic RV Percent Paced: 81.21 %
HIGH POWER IMPEDANCE MEASURED VALUE: 51 Ohm
HighPow Impedance: 64 Ohm
Implantable Lead Implant Date: 20071010
Implantable Lead Location: 753858
Implantable Lead Location: 753859
Implantable Lead Model: 4194
Implantable Lead Model: 5076
Lead Channel Impedance Value: 361 Ohm
Lead Channel Impedance Value: 456 Ohm
Lead Channel Impedance Value: 513 Ohm
Lead Channel Impedance Value: 551 Ohm
Lead Channel Pacing Threshold Amplitude: 0.5 V
Lead Channel Pacing Threshold Amplitude: 1 V
Lead Channel Pacing Threshold Pulse Width: 0.4 ms
Lead Channel Pacing Threshold Pulse Width: 0.4 ms
Lead Channel Sensing Intrinsic Amplitude: 13.875 mV
Lead Channel Setting Pacing Amplitude: 2 V
Lead Channel Setting Pacing Amplitude: 2.5 V
Lead Channel Setting Pacing Pulse Width: 0.4 ms
Lead Channel Setting Pacing Pulse Width: 0.4 ms
Lead Channel Setting Sensing Sensitivity: 0.3 mV
MDC IDC LEAD IMPLANT DT: 20071010
MDC IDC LEAD IMPLANT DT: 20120419
MDC IDC LEAD LOCATION: 753860
MDC IDC MSMT LEADCHNL LV IMPEDANCE VALUE: 589 Ohm
MDC IDC MSMT LEADCHNL LV IMPEDANCE VALUE: 817 Ohm
MDC IDC MSMT LEADCHNL RA SENSING INTR AMPL: 1.25 mV
MDC IDC MSMT LEADCHNL RA SENSING INTR AMPL: 1.25 mV
MDC IDC MSMT LEADCHNL RV SENSING INTR AMPL: 13.875 mV
MDC IDC PG IMPLANT DT: 20170419
MDC IDC SESS DTM: 20180710181212
MDC IDC STAT BRADY AS VP PERCENT: 78.53 %

## 2018-01-13 DIAGNOSIS — H6122 Impacted cerumen, left ear: Secondary | ICD-10-CM | POA: Diagnosis not present

## 2018-01-13 DIAGNOSIS — J302 Other seasonal allergic rhinitis: Secondary | ICD-10-CM | POA: Diagnosis not present

## 2018-01-17 ENCOUNTER — Other Ambulatory Visit: Payer: Self-pay | Admitting: Cardiology

## 2018-01-28 ENCOUNTER — Ambulatory Visit (INDEPENDENT_AMBULATORY_CARE_PROVIDER_SITE_OTHER): Payer: PPO | Admitting: *Deleted

## 2018-01-28 ENCOUNTER — Encounter (INDEPENDENT_AMBULATORY_CARE_PROVIDER_SITE_OTHER): Payer: Self-pay

## 2018-01-28 DIAGNOSIS — H2511 Age-related nuclear cataract, right eye: Secondary | ICD-10-CM | POA: Diagnosis not present

## 2018-01-28 DIAGNOSIS — I480 Paroxysmal atrial fibrillation: Secondary | ICD-10-CM | POA: Diagnosis not present

## 2018-01-28 DIAGNOSIS — Z5181 Encounter for therapeutic drug level monitoring: Secondary | ICD-10-CM

## 2018-01-28 DIAGNOSIS — H35033 Hypertensive retinopathy, bilateral: Secondary | ICD-10-CM | POA: Diagnosis not present

## 2018-01-28 DIAGNOSIS — H25011 Cortical age-related cataract, right eye: Secondary | ICD-10-CM | POA: Diagnosis not present

## 2018-01-28 DIAGNOSIS — H35362 Drusen (degenerative) of macula, left eye: Secondary | ICD-10-CM | POA: Diagnosis not present

## 2018-01-28 LAB — POCT INR: INR: 2.4

## 2018-01-28 NOTE — Patient Instructions (Signed)
Description   Continue on same dosage 1 tablet daily except 1.5 tablets on Saturdays. Recheck INR  in 6  weeks. Call us with any changes # [phone].

## 2018-02-01 ENCOUNTER — Other Ambulatory Visit: Payer: Self-pay | Admitting: Cardiology

## 2018-02-14 ENCOUNTER — Other Ambulatory Visit: Payer: Self-pay | Admitting: Cardiology

## 2018-02-15 NOTE — Telephone Encounter (Signed)
REFILL 

## 2018-03-05 ENCOUNTER — Other Ambulatory Visit: Payer: Self-pay | Admitting: Cardiology

## 2018-03-09 ENCOUNTER — Ambulatory Visit (INDEPENDENT_AMBULATORY_CARE_PROVIDER_SITE_OTHER): Payer: PPO | Admitting: Pharmacist

## 2018-03-09 DIAGNOSIS — I480 Paroxysmal atrial fibrillation: Secondary | ICD-10-CM | POA: Diagnosis not present

## 2018-03-09 DIAGNOSIS — Z5181 Encounter for therapeutic drug level monitoring: Secondary | ICD-10-CM

## 2018-03-09 LAB — POCT INR: INR: 2.8

## 2018-03-09 NOTE — Patient Instructions (Signed)
Description   Continue on same dosage 1 tablet daily except 1.5 tablets on Saturdays. Recheck INR  in 6  weeks. Call us with any changes # [phone].

## 2018-03-15 ENCOUNTER — Telehealth: Payer: Self-pay | Admitting: Cardiology

## 2018-03-15 MED ORDER — FUROSEMIDE 20 MG PO TABS: ORAL_TABLET | ORAL | 3 refills | Status: DC

## 2018-03-15 NOTE — Telephone Encounter (Signed)
New Message:       Pt c/o medication issue:  1. Name of Medication: furosemide (LASIX) 20 MG tablet  2. How are you currently taking this medication (dosage and times per day)? 20 mg 7 days a week/on MWF pt takes 40 mg per req of dr 3. Are you having a reaction (difficulty breathing--STAT)? no  4. What is your medication issue? Pt states he needs a 120 day supply to be able to get through the month to take as he is supposed to

## 2018-03-15 NOTE — Telephone Encounter (Signed)
[name] is very up to date with what he takes and his recollection is correct.

## 2018-03-15 NOTE — Telephone Encounter (Signed)
Patient called in stating that the wrong prescription had been sent in for a refill. His Furosemide was sent in as 20 mg daily when he takes 20 mg 4 days a week and 40 mg three days a week. According to his last office visit that was the way it was prescribed. The correct prescription has been sent in for the patient. He will call back if anything further is needed. Message has been routed to the provider for verification.

## 2018-03-18 ENCOUNTER — Ambulatory Visit (INDEPENDENT_AMBULATORY_CARE_PROVIDER_SITE_OTHER): Payer: PPO | Admitting: *Deleted

## 2018-03-18 DIAGNOSIS — I255 Ischemic cardiomyopathy: Secondary | ICD-10-CM | POA: Diagnosis not present

## 2018-03-18 NOTE — Progress Notes (Signed)
Remote ICD transmission.   

## 2018-03-23 ENCOUNTER — Encounter: Payer: Self-pay | Admitting: Cardiology

## 2018-03-30 LAB — CUP PACEART REMOTE DEVICE CHECK
Battery Voltage: 2.96 V
Brady Statistic AP VP Percent: 0 %
Brady Statistic AS VP Percent: 89.13 %
Brady Statistic RA Percent Paced: 0 %
Brady Statistic RV Percent Paced: 91.18 %
Date Time Interrogation Session: 20190502062725
HighPow Impedance: 46 Ohm
HighPow Impedance: 57 Ohm
Implantable Lead Location: 753858
Implantable Lead Model: 5076
Implantable Lead Model: 7121
Lead Channel Impedance Value: 361 Ohm
Lead Channel Impedance Value: 399 Ohm
Lead Channel Pacing Threshold Amplitude: 0.625 V
Lead Channel Pacing Threshold Amplitude: 1.25 V
Lead Channel Pacing Threshold Pulse Width: 0.4 ms
Lead Channel Pacing Threshold Pulse Width: 0.4 ms
Lead Channel Sensing Intrinsic Amplitude: 14.125 mV
Lead Channel Setting Pacing Amplitude: 2.25 V
Lead Channel Setting Pacing Amplitude: 2.5 V
Lead Channel Setting Pacing Pulse Width: 0.4 ms
Lead Channel Setting Sensing Sensitivity: 0.3 mV
MDC IDC LEAD IMPLANT DT: 20071010
MDC IDC LEAD IMPLANT DT: 20071010
MDC IDC LEAD IMPLANT DT: 20120419
MDC IDC LEAD LOCATION: 753859
MDC IDC LEAD LOCATION: 753860
MDC IDC MSMT BATTERY REMAINING LONGEVITY: 57 mo
MDC IDC MSMT LEADCHNL LV IMPEDANCE VALUE: 361 Ohm
MDC IDC MSMT LEADCHNL LV IMPEDANCE VALUE: 532 Ohm
MDC IDC MSMT LEADCHNL LV IMPEDANCE VALUE: 760 Ohm
MDC IDC MSMT LEADCHNL RA SENSING INTR AMPL: 1.125 mV
MDC IDC MSMT LEADCHNL RA SENSING INTR AMPL: 1.125 mV
MDC IDC MSMT LEADCHNL RV IMPEDANCE VALUE: 475 Ohm
MDC IDC MSMT LEADCHNL RV SENSING INTR AMPL: 14.125 mV
MDC IDC PG IMPLANT DT: 20170419
MDC IDC SET LEADCHNL RV PACING PULSEWIDTH: 0.4 ms
MDC IDC STAT BRADY AP VS PERCENT: 0 %
MDC IDC STAT BRADY AS VS PERCENT: 10.87 %

## 2018-04-08 ENCOUNTER — Other Ambulatory Visit: Payer: Self-pay | Admitting: Cardiology

## 2018-04-20 ENCOUNTER — Ambulatory Visit (INDEPENDENT_AMBULATORY_CARE_PROVIDER_SITE_OTHER): Payer: PPO | Admitting: *Deleted

## 2018-04-20 DIAGNOSIS — Z5181 Encounter for therapeutic drug level monitoring: Secondary | ICD-10-CM

## 2018-04-20 DIAGNOSIS — I480 Paroxysmal atrial fibrillation: Secondary | ICD-10-CM | POA: Diagnosis not present

## 2018-04-20 LAB — POCT INR: INR: 3.7 — AB (ref 2.0–3.0)

## 2018-04-20 NOTE — Patient Instructions (Signed)
Description   Skip tomorrow's dose, then Continue on same dosage 1 tablet daily except 1.5 tablets on Saturdays. Recheck INR  in 2  weeks. Call us with any changes # [phone].

## 2018-04-22 ENCOUNTER — Encounter: Payer: Self-pay | Admitting: Internal Medicine

## 2018-04-22 ENCOUNTER — Telehealth: Payer: Self-pay | Admitting: *Deleted

## 2018-04-22 NOTE — Telephone Encounter (Signed)
Left message to call back   Janan Ridge at 04/22/2018 1:13 PM   Status: Signed    New Message:       Pt calling and states that he would like to know if it will be okay to wait to come and see Hochrein in November/December.

## 2018-04-22 NOTE — Telephone Encounter (Signed)
Spoke with patient and he has appointment in August and just wanted to wait until November or December to see [name]. Will forward to [name] for review

## 2018-04-22 NOTE — Telephone Encounter (Signed)
Opened under wrong provider   This encounter was created in error - please disregard.

## 2018-04-22 NOTE — Telephone Encounter (Signed)
New Message:       Pt calling and states that he would like to know if it will be okay to wait to come and see Hochrein in November/December.

## 2018-04-22 NOTE — Telephone Encounter (Signed)
OK 

## 2018-05-03 ENCOUNTER — Other Ambulatory Visit: Payer: Self-pay | Admitting: Cardiology

## 2018-05-04 ENCOUNTER — Ambulatory Visit: Payer: PPO | Admitting: *Deleted

## 2018-05-04 DIAGNOSIS — Z5181 Encounter for therapeutic drug level monitoring: Secondary | ICD-10-CM | POA: Diagnosis not present

## 2018-05-04 DIAGNOSIS — I480 Paroxysmal atrial fibrillation: Secondary | ICD-10-CM

## 2018-05-04 LAB — POCT INR: INR: 3.6 — AB (ref 2.0–3.0)

## 2018-05-04 NOTE — Patient Instructions (Addendum)
Description   Skip tomorrow's dose, then change your dose to dosage to  1 tablet daily except 1/2 tablet on Sundays. Recheck INR  in 2  weeks. Call us with any changes # [phone].

## 2018-05-06 DIAGNOSIS — M109 Gout, unspecified: Secondary | ICD-10-CM | POA: Diagnosis not present

## 2018-05-18 ENCOUNTER — Ambulatory Visit: Payer: PPO | Admitting: *Deleted

## 2018-05-18 DIAGNOSIS — I480 Paroxysmal atrial fibrillation: Secondary | ICD-10-CM

## 2018-05-18 DIAGNOSIS — Z5181 Encounter for therapeutic drug level monitoring: Secondary | ICD-10-CM | POA: Diagnosis not present

## 2018-05-18 LAB — POCT INR: INR: 1.8 — AB (ref 2.0–3.0)

## 2018-05-18 NOTE — Patient Instructions (Signed)
Description   Today take an extra 1/2 tablet, then start taking 1 tablet everyday.  Recheck INR  In 3 weeks. Call us with any changes # [phone].

## 2018-06-07 ENCOUNTER — Other Ambulatory Visit: Payer: Self-pay | Admitting: Cardiology

## 2018-06-08 ENCOUNTER — Ambulatory Visit: Payer: PPO | Admitting: *Deleted

## 2018-06-08 DIAGNOSIS — Z5181 Encounter for therapeutic drug level monitoring: Secondary | ICD-10-CM | POA: Diagnosis not present

## 2018-06-08 DIAGNOSIS — I480 Paroxysmal atrial fibrillation: Secondary | ICD-10-CM | POA: Diagnosis not present

## 2018-06-08 LAB — POCT INR: INR: 2 (ref 2.0–3.0)

## 2018-06-08 NOTE — Patient Instructions (Addendum)
  Description   Change your dose to 1 tablet daily except 1.5 tablet on Saturdays. Eat 3 serving each week of leafy green vegetable.   Recheck INR  In 4 weeks. Call us with any changes # [phone].

## 2018-06-17 ENCOUNTER — Ambulatory Visit (INDEPENDENT_AMBULATORY_CARE_PROVIDER_SITE_OTHER): Payer: PPO | Admitting: *Deleted

## 2018-06-17 ENCOUNTER — Encounter: Payer: Self-pay | Admitting: Cardiology

## 2018-06-17 DIAGNOSIS — I255 Ischemic cardiomyopathy: Secondary | ICD-10-CM

## 2018-06-17 NOTE — Progress Notes (Signed)
Remote ICD transmission.   

## 2018-07-06 ENCOUNTER — Ambulatory Visit: Payer: PPO

## 2018-07-06 DIAGNOSIS — Z5181 Encounter for therapeutic drug level monitoring: Secondary | ICD-10-CM

## 2018-07-06 DIAGNOSIS — I480 Paroxysmal atrial fibrillation: Secondary | ICD-10-CM

## 2018-07-06 LAB — POCT INR: INR: 2.7 (ref 2.0–3.0)

## 2018-07-06 NOTE — Patient Instructions (Signed)
Please continue 1 tablet daily except 1.5 tablet on Saturdays. Eat 3 serving each week of leafy green vegetable.   Recheck INR  In 5 weeks. Call us with any changes # [phone].

## 2018-07-14 ENCOUNTER — Encounter: Payer: Self-pay | Admitting: Internal Medicine

## 2018-07-14 ENCOUNTER — Ambulatory Visit (INDEPENDENT_AMBULATORY_CARE_PROVIDER_SITE_OTHER): Payer: PPO | Admitting: Internal Medicine

## 2018-07-14 VITALS — BP 124/74 | Ht 68.0 in | Wt 177.0 lb

## 2018-07-14 DIAGNOSIS — I4821 Permanent atrial fibrillation: Secondary | ICD-10-CM

## 2018-07-14 DIAGNOSIS — I5022 Chronic systolic (congestive) heart failure: Secondary | ICD-10-CM | POA: Diagnosis not present

## 2018-07-14 DIAGNOSIS — I482 Chronic atrial fibrillation: Secondary | ICD-10-CM

## 2018-07-14 DIAGNOSIS — I255 Ischemic cardiomyopathy: Secondary | ICD-10-CM

## 2018-07-14 DIAGNOSIS — Z95810 Presence of automatic (implantable) cardiac defibrillator: Secondary | ICD-10-CM

## 2018-07-14 LAB — BASIC METABOLIC PANEL
BUN/Creatinine Ratio: 13 (ref 10–24)
BUN: 19 mg/dL (ref 8–27)
CALCIUM: 9.5 mg/dL (ref 8.6–10.2)
CHLORIDE: 97 mmol/L (ref 96–106)
CO2: 21 mmol/L (ref 20–29)
Creatinine, Ser: 1.41 mg/dL — ABNORMAL HIGH (ref 0.76–1.27)
GFR calc non Af Amer: 49 mL/min/{1.73_m2} — ABNORMAL LOW (ref >59)
GFR, EST AFRICAN AMERICAN: 57 mL/min/{1.73_m2} — AB (ref >59)
Glucose: 104 mg/dL — ABNORMAL HIGH (ref 65–99)
POTASSIUM: 4.7 mmol/L (ref 3.5–5.2)
Sodium: 134 mmol/L (ref 134–144)

## 2018-07-14 LAB — CBC
Hematocrit: 41 % (ref 37.5–51.0)
Hemoglobin: 14.2 g/dL (ref 13.0–17.7)
MCH: 33.4 pg — ABNORMAL HIGH (ref 26.6–33.0)
MCHC: 34.6 g/dL (ref 31.5–35.7)
MCV: 97 fL (ref 79–97)
Platelets: 269 10*3/uL (ref 150–450)
RBC: 4.25 x10E6/uL (ref 4.14–5.80)
RDW: 14.7 % (ref 12.3–15.4)
WBC: 6.6 10*3/uL (ref 3.4–10.8)

## 2018-07-14 LAB — DIGOXIN LEVEL

## 2018-07-14 NOTE — Progress Notes (Signed)
Patient Care Team: Nona Dell, Corene Cornea, MD as PCP - General (Specialist)   HPI  [name] is a 72 y.o. male Seen in followup for CRT-D implanted for congestive heart failure in the setting of ischemic heart disease. Hospitalization 2013 had required milrinone He underwent generator replacement April 2012 with replacement of his 6949 ICD lead and again 4/17.  He has permanent atrial fibrillation. Rate control is adequate.   He is followed by [facility] and [facility] clinic(remotely)  .  The patient denies chest pain, shortness of breath, nocturnal dyspnea, orthopnea or peripheral edema.  There have been no palpitations, lightheadedness or syncope.   No bleeding   DATE TEST    4/12    Myoview   EF 28 %   2014 Echo   EF 25 %   1/17 Echo  EF 35-40       Date Cr/GFR  K Hgb Dig  4/17  1.46 4.3     7//18  1.9/34 5.0 13.4 <0.5 (4/18)  1/19 1.41 5.4         Past Medical History:  Diagnosis Date  . 6949-lead    replaced 02/2011  . Acute on chronic renal insufficiency   . AICD (automatic cardioverter/defibrillator) present    Medtronic CRT  . Asthma   . Atrial flutter ([place])    s/p TEE guided cardioversion  . CHF (congestive heart failure) ([place])   . Chronic drug-induced interstitial lung disorders ([place])   . COPD (chronic obstructive pulmonary disease) ([place])    " very MILD "  . Coronary artery disease    total occlusion of the right coronary artery, left to right collaterals.  He had Cypher stenting to the circumflex in  July 2003  . Family hx of colon cancer    2 SISTERS  . Fatty liver    CT  . Headache(784.0)   . Hyperlipemia   . Hypertension   . Hyponatremia   . Ischemic cardiomyopathy    Myoview 02/2011  EF 28%  Coronary artery disease  (total occlusion of the right coronary artery, left-to-right )  . LBBB (left bundle branch block)   . Nonsustained ventricular tachycardia ([place])   . Seizure disorder [facility])     Past Surgical History:  Procedure Laterality Date    . CORONARY ANGIOPLASTY    . EP IMPLANTABLE DEVICE N/A 03/05/2016   Procedure: ICD  Generator Changeout;  Surgeon: Deboraha Sprang, MD;  Location: [place] CV LAB;  Service: Cardiovascular;  Laterality: N/A;  . ESOPHAGOGASTRODUODENOSCOPY  09/13/2012   Procedure: ESOPHAGOGASTRODUODENOSCOPY (EGD);  Surgeon: Ladene Artist, MD,FACG;  Location: [facility];  Service: Endoscopy;  Laterality: N/A;  . HERNIA REPAIR    . INSERT / REPLACE / [place]  . RIGHT HEART CATHETERIZATION N/A 09/17/2012   Procedure: RIGHT HEART CATH;  Surgeon: Jolaine Artist, MD;  Location: [facility] CATH LAB;  Service: Cardiovascular;  Laterality: N/A;    Current Outpatient Medications  Medication Sig Dispense Refill  . Ascorbic Acid (VITAMIN C) POWD Take 1 Dose by mouth daily.     . ASTRAGALUS PO Take 1 Dose by mouth daily.     . bisoprolol (ZEBETA) 5 MG tablet TAKE 1/2 TABLET BY MOUTH DAILY 90 tablet 3  . Calcium-Magnesium 500-250 MG TABS Take 1 tablet by mouth daily.     . carvedilol (COREG CR) 20 MG 24 hr capsule Take 1 capsule (20 mg total) by mouth  daily. 90 capsule 3  . cetirizine (ZYRTEC) 10 MG tablet Take 10 mg by mouth daily.    . colchicine 0.6 MG tablet Take 0.6 mg by mouth daily as needed (for gout flare).    Marland Kitchen dextromethorphan-guaiFENesin (MUCINEX DM) 30-600 MG 12hr tablet Take 1 tablet by mouth 2 (two) times daily as needed for cough.    . digoxin (LANOXIN) 0.125 MG tablet TAKE 1 TABLET BY MOUTH EVERY OTHER DAY (KEEP OFFICE VISIT) 90 tablet 2  . diphenhydrAMINE (BENADRYL) 25 mg capsule Take 25 mg by mouth every 6 (six) hours as needed for allergies.     . fish oil-omega-3 fatty acids 1000 MG capsule Take 2 g by mouth daily.    . furosemide (LASIX) 20 MG tablet Take 20 mg (one tablet) on Tues, Thursday, Sat and Sunday. Take 40 mg (two tablets) on Mon, Wed, Friday. 130 tablet 3  . hydrALAZINE (APRESOLINE) 25 MG tablet TAKE 1 TABLET BY MOUTH TWICE A DAY 180 tablet 1  . magnesium oxide  (MAG-OX) 400 MG tablet Take 1 tablet (400 mg total) by mouth daily. 90 tablet 3  . montelukast (SINGULAIR) 10 MG tablet Take 1 tablet by mouth daily.  3  . Multiple Vitamin (MULTIVITAMIN WITH MINERALS) TABS Take 1 tablet by mouth daily.    . nitroGLYCERIN (NITROSTAT) 0.4 MG SL tablet Place 0.4 mg under the tongue every 5 (five) minutes as needed for chest pain. (up to 3 doses) For chest pain    . ondansetron (ZOFRAN-ODT) 4 MG disintegrating tablet Take 4 mg by mouth every 4 (four) hours as needed for nausea.     Marland Kitchen spironolactone (ALDACTONE) 25 MG tablet TAKE HALF TABLET DAILY 45 tablet 6  . traMADol (ULTRAM) 50 MG tablet Take 50 mg by mouth every 6 (six) hours as needed.    . warfarin (COUMADIN) 5 MG tablet TAKE 1 TO 1 AND 1/2 TABLETS BY MOUTH DAILY AS DIRECTED BY COUMADIN CLINIC 135 tablet 0  . bisoprolol (ZEBETA) 5 MG tablet TAKE 1/2 TABLET BY MOUTH DAILY 45 tablet 1  . hydrALAZINE (APRESOLINE) 25 MG tablet TAKE 1 TABLET BY MOUTH EVERY DAY 180 tablet 2   No current facility-administered medications for this visit.     Allergies  Allergen Reactions  . Sulfa Antibiotics Shortness Of Breath  . Ace Inhibitors     Unknown  . Corzide [Nadolol-Bendroflumethiazide]     Unknown  . Diovan [Valsartan]     Unknown  . Flagyl [Metronidazole] Other (See Comments)    Unknown  . Omeprazole     Shut my kidneys down  . Other     Perfumes smells; paint smells; formaldehyde smells  . Shellfish Allergy   . Tape Other (See Comments)  . Amlodipine Besylate Other (See Comments)    Numbness to lips; made legs give out  . Codeine Itching and Rash    Itching  . Latex Itching and Rash  . Norvasc [Amlodipine Besylate] Other (See Comments)    Numbness to lips; made legs give out  . Penicillins Rash    Childhood reaction    Review of Systems negative except from HPI and PMH  Physical Exam BP 124/74   Ht 5\' 8"  (1.727 m)   Wt 177 lb (80.3 kg)   SpO2 95%   BMI 26.91 kg/m  Well developed and  nourished in no acute distress HENT normal Neck supple with JVP-6-7 Clear Regular rate and rhythm, no murmurs or gallops Abd-soft with active BS No Clubbing cyanosis  edema Skin-warm and dry A & Oriented  Grossly normal sensory and motor function    AFib 73 Vpacing upright QRS v1 and R in lead 1      Assessment and  Plan  Atrial fibrillation-permanent  Ischemic heart disease   Implantable defibrillator-CRT-Medtronic The patient's device was interrogated.  The information was reviewed. No changes were made in the programming.     Congestive heart failure-chronic  PVCs  Hyperkalemia   Renal insufficiency grade 3   PVCs quiescient   Without symptoms of ischemia   Euvolemic continue current meds  On Anticoagulation;  No bleeding issues-- check Hgb    V pacing is increased from 75--92% with V sensed response at 8%  Will need to recheck K and dig levels

## 2018-07-14 NOTE — Patient Instructions (Addendum)
Medication Instructions:  Your physician recommends that you continue on your current medications as directed. Please refer to the Current Medication list given to you today.  Labwork: You will have labs drawn today: CBC, BMP, Dig level  Testing/Procedures: None ordered.  Follow-Up: Your physician recommends that you schedule a follow-up appointment in:   [name] in Feb instead of November  Dr Caryl Comes in One Year  Remote monitoring is used to monitor your Pacemaker of ICD from home. This monitoring reduces the number of office visits required to check your device to one time per year. It allows [place] to keep an eye on the functioning of your device to ensure it is working properly. You are scheduled for a device check from home on 10/31. You may send your transmission at any time that day. If you have a wireless device, the transmission will be sent automatically. After your physician reviews your transmission, you will receive a postcard with your next transmission date.    Any Other Special Instructions Will Be Listed Below (If Applicable).     If you need a refill on your cardiac medications before your next appointment, please call your pharmacy.

## 2018-07-15 LAB — CUP PACEART REMOTE DEVICE CHECK
Brady Statistic AP VP Percent: 0 %
Brady Statistic AS VP Percent: 90.24 %
Brady Statistic RA Percent Paced: 0 %
Brady Statistic RV Percent Paced: 92 %
Date Time Interrogation Session: 20190801073523
HighPow Impedance: 45 Ohm
HighPow Impedance: 54 Ohm
Implantable Lead Implant Date: 20071010
Implantable Lead Location: 753858
Implantable Lead Location: 753859
Implantable Lead Model: 5076
Implantable Lead Model: 7121
Lead Channel Impedance Value: 532 Ohm
Lead Channel Impedance Value: 760 Ohm
Lead Channel Pacing Threshold Pulse Width: 0.4 ms
Lead Channel Sensing Intrinsic Amplitude: 1 mV
Lead Channel Sensing Intrinsic Amplitude: 1 mV
Lead Channel Sensing Intrinsic Amplitude: 16.125 mV
Lead Channel Setting Pacing Amplitude: 2.5 V
Lead Channel Setting Pacing Pulse Width: 0.4 ms
Lead Channel Setting Pacing Pulse Width: 0.4 ms
Lead Channel Setting Sensing Sensitivity: 0.3 mV
MDC IDC LEAD IMPLANT DT: 20071010
MDC IDC LEAD IMPLANT DT: 20120419
MDC IDC LEAD LOCATION: 753860
MDC IDC MSMT BATTERY REMAINING LONGEVITY: 51 mo
MDC IDC MSMT BATTERY VOLTAGE: 2.95 V
MDC IDC MSMT LEADCHNL LV IMPEDANCE VALUE: 342 Ohm
MDC IDC MSMT LEADCHNL LV PACING THRESHOLD AMPLITUDE: 1 V
MDC IDC MSMT LEADCHNL RA IMPEDANCE VALUE: 399 Ohm
MDC IDC MSMT LEADCHNL RV IMPEDANCE VALUE: 361 Ohm
MDC IDC MSMT LEADCHNL RV IMPEDANCE VALUE: 456 Ohm
MDC IDC MSMT LEADCHNL RV PACING THRESHOLD AMPLITUDE: 0.625 V
MDC IDC MSMT LEADCHNL RV PACING THRESHOLD PULSEWIDTH: 0.4 ms
MDC IDC MSMT LEADCHNL RV SENSING INTR AMPL: 16.125 mV
MDC IDC PG IMPLANT DT: 20170419
MDC IDC SET LEADCHNL LV PACING AMPLITUDE: 2 V
MDC IDC STAT BRADY AP VS PERCENT: 0 %
MDC IDC STAT BRADY AS VS PERCENT: 9.76 %

## 2018-07-23 ENCOUNTER — Other Ambulatory Visit: Payer: Self-pay | Admitting: Cardiology

## 2018-07-23 ENCOUNTER — Telehealth: Payer: Self-pay | Admitting: Internal Medicine

## 2018-07-23 NOTE — Telephone Encounter (Signed)
New Message:     Pt is requesting a copy of his labs be sent to his address instead of a call

## 2018-07-28 ENCOUNTER — Other Ambulatory Visit: Payer: Self-pay | Admitting: Cardiology

## 2018-07-28 MED ORDER — HYDRALAZINE HCL 25 MG PO TABS: 25.0000 mg | ORAL_TABLET | Freq: Two times a day (BID) | ORAL | 1 refills | Status: DC

## 2018-07-28 NOTE — Telephone Encounter (Signed)
Rx(s) sent to pharmacy electronically.  

## 2018-07-28 NOTE — Telephone Encounter (Signed)
New Message:    Pt says his Hydralazine was filled for 1 tablet a day and he needs it called for 2 times a day please.

## 2018-08-09 ENCOUNTER — Telehealth: Payer: Self-pay | Admitting: Cardiology

## 2018-08-09 NOTE — Telephone Encounter (Signed)
Patient called and stated that he heard an alert tone from his ICD. Instructed pt to send a manual transmission w/ his home monitor. Attempted to help pt trouble shoot his home monitor. After one unsuccessful attempt I instructed pt to call tech support. Pt verbalized understanding.

## 2018-08-09 NOTE — Telephone Encounter (Signed)
Spoke with pt again and assured him that there was nothing abnormal with his defibrillator transmission.

## 2018-08-09 NOTE — Telephone Encounter (Signed)
Please call pt back at [phone]

## 2018-08-09 NOTE — Telephone Encounter (Signed)
Spoke with pt informed him that I had reviewed his transmission and there were no alerts the noise that he heard was not coming from his ICD and that his ICD was functioning normally pt voiced understanding

## 2018-08-10 ENCOUNTER — Ambulatory Visit: Payer: PPO

## 2018-08-10 DIAGNOSIS — I480 Paroxysmal atrial fibrillation: Secondary | ICD-10-CM

## 2018-08-10 DIAGNOSIS — Z5181 Encounter for therapeutic drug level monitoring: Secondary | ICD-10-CM

## 2018-08-10 LAB — POCT INR: INR: 2.5 (ref 2.0–3.0)

## 2018-08-10 NOTE — Patient Instructions (Signed)
Please continue 1 tablet daily except 1.5 tablet on Saturdays. Eat 3 serving each week of leafy green vegetable.   Recheck INR  In 6 weeks. Call us with any changes # [phone].

## 2018-08-12 NOTE — Telephone Encounter (Signed)
Printed and mailed labs to patient.

## 2018-08-22 LAB — CUP PACEART INCLINIC DEVICE CHECK
Brady Statistic AP VP Percent: 0 %
Brady Statistic AP VS Percent: 0 %
Brady Statistic AS VP Percent: 89.94 %
Brady Statistic RV Percent Paced: 91.78 %
Date Time Interrogation Session: 20190828131735
HighPow Impedance: 46 Ohm
HighPow Impedance: 61 Ohm
Implantable Lead Implant Date: 20120419
Implantable Lead Location: 753858
Implantable Lead Model: 5076
Implantable Lead Model: 7121
Implantable Pulse Generator Implant Date: 20170419
Lead Channel Impedance Value: 304 Ohm
Lead Channel Impedance Value: 418 Ohm
Lead Channel Impedance Value: 456 Ohm
Lead Channel Impedance Value: 551 Ohm
Lead Channel Impedance Value: 703 Ohm
Lead Channel Setting Pacing Amplitude: 2.25 V
Lead Channel Setting Pacing Pulse Width: 0.4 ms
MDC IDC LEAD IMPLANT DT: 20071010
MDC IDC LEAD IMPLANT DT: 20071010
MDC IDC LEAD LOCATION: 753859
MDC IDC LEAD LOCATION: 753860
MDC IDC MSMT BATTERY REMAINING LONGEVITY: 48 mo
MDC IDC MSMT BATTERY VOLTAGE: 2.95 V
MDC IDC MSMT LEADCHNL LV PACING THRESHOLD AMPLITUDE: 1 V
MDC IDC MSMT LEADCHNL LV PACING THRESHOLD PULSEWIDTH: 0.4 ms
MDC IDC MSMT LEADCHNL RV IMPEDANCE VALUE: 361 Ohm
MDC IDC MSMT LEADCHNL RV PACING THRESHOLD AMPLITUDE: 0.75 V
MDC IDC MSMT LEADCHNL RV PACING THRESHOLD PULSEWIDTH: 0.4 ms
MDC IDC MSMT LEADCHNL RV SENSING INTR AMPL: 16 mV
MDC IDC SET LEADCHNL RV PACING AMPLITUDE: 2.5 V
MDC IDC SET LEADCHNL RV PACING PULSEWIDTH: 0.4 ms
MDC IDC SET LEADCHNL RV SENSING SENSITIVITY: 0.3 mV
MDC IDC STAT BRADY AS VS PERCENT: 10.06 %
MDC IDC STAT BRADY RA PERCENT PACED: 0 %

## 2018-08-27 ENCOUNTER — Telehealth: Payer: Self-pay | Admitting: Cardiology

## 2018-08-27 ENCOUNTER — Ambulatory Visit: Payer: PPO | Admitting: *Deleted

## 2018-08-27 DIAGNOSIS — I480 Paroxysmal atrial fibrillation: Secondary | ICD-10-CM

## 2018-08-27 DIAGNOSIS — Z5181 Encounter for therapeutic drug level monitoring: Secondary | ICD-10-CM

## 2018-08-27 DIAGNOSIS — I4891 Unspecified atrial fibrillation: Secondary | ICD-10-CM | POA: Diagnosis not present

## 2018-08-27 DIAGNOSIS — R1013 Epigastric pain: Secondary | ICD-10-CM | POA: Diagnosis not present

## 2018-08-27 LAB — POCT INR: INR: 1.8 — AB (ref 2.0–3.0)

## 2018-08-27 NOTE — Telephone Encounter (Signed)
Attempted to contact pt. Unable to leave message due to voicemail being full.

## 2018-08-27 NOTE — Telephone Encounter (Signed)
Left message to call back  

## 2018-08-27 NOTE — Patient Instructions (Signed)
Description   Today take 1.5 tablets, then continue taking 1 tablet daily except 1.5 tablet on Saturdays. Eat 3 serving each week of leafy green vegetable.   Recheck INR in 2 weeks. Call us with any changes # [phone].

## 2018-08-27 NOTE — Telephone Encounter (Signed)
New Message   Pt states he is calling because he wanted to let Dr. Percival Spanish know that he is having some pain in his back, stomach, has diarrhea and black stools. Please call

## 2018-08-27 NOTE — Telephone Encounter (Signed)
I would be happy to see him next week

## 2018-08-27 NOTE — Telephone Encounter (Signed)
Spoke with pt who states for the past 2-3 week he's been having back pain, diarrhea, and black stool. He seen today by coumadin clinic and INR level was 1.8. Pt was instructed by them to follow up with PCP. Pt states he has an appointment today at 1:45 pm but to inform Dr. Percival Spanish because he states last time he was having these symptoms he was told it was related to his heart. Will route to MD for further recommendations.

## 2018-08-30 NOTE — Telephone Encounter (Signed)
Pt is scheduled to see Dr. Percival Spanish on 09/03/18 at 3 pm.

## 2018-09-02 NOTE — Progress Notes (Signed)
HPI   The patient presents for followup of his cardiomyopathy. Since I last saw him he has had follow up of his device.  There were no significant abnormalities.   He called recently he had back pain, diarrhea and black stool.   He was seen by his primary provider.  Apparently a guaiac was negative.  I do not have a CBC but he was told it was normal.  He has been treated with Prilosec and he thinks he is slowly getting better.  He has had some back discomfort and he was worried that this could be his heart.  He has had some belching and indigestion.  However, again this is slowly getting better.  He had some tongue bleeding so he drank a green beverage and tea bag on the bleeding and symptoms improved bleeding.  His INR was actually subtherapeutic when he was checked.  He denies any new swelling.  He sleeps in a hospital bed.  His head is slightly elevated.  This is unchanged.  Of note he thought that his defibrillator alarm went off but when it was interrogated it was functioning normally.  He says the Medtronic told him his home box was off for 20 days.  I do not have any data about this.  Is also important to note that his brother who has a defibrillator died suddenly the day before his symptoms began.  Allergies  Allergen Reactions  . Sulfa Antibiotics Shortness Of Breath  . Ace Inhibitors     Unknown  . Corzide [Nadolol-Bendroflumethiazide]     Unknown  . Diovan [Valsartan]     Unknown  . Flagyl [Metronidazole] Other (See Comments)    Unknown  . Omeprazole     Shut my kidneys down  . Other     Perfumes smells; paint smells; formaldehyde smells  . Shellfish Allergy   . Tape Other (See Comments)  . Amlodipine Besylate Other (See Comments)    Numbness to lips; made legs give out  . Codeine Itching and Rash    Itching  . Latex Itching and Rash  . Norvasc [Amlodipine Besylate] Other (See Comments)    Numbness to lips; made legs give out  . Penicillins Rash    Childhood reaction     Current Outpatient Medications  Medication Sig Dispense Refill  . Ascorbic Acid (VITAMIN C) POWD Take 1 Dose by mouth daily.     . ASTRAGALUS PO Take 1 Dose by mouth daily.     . bisoprolol (ZEBETA) 5 MG tablet TAKE 1/2 TABLET BY MOUTH DAILY 45 tablet 1  . bisoprolol (ZEBETA) 5 MG tablet TAKE 1/2 TABLET BY MOUTH DAILY 90 tablet 3  . Calcium-Magnesium 500-250 MG TABS Take 1 tablet by mouth daily.     . carvedilol (COREG CR) 20 MG 24 hr capsule Take 1 capsule (20 mg total) by mouth daily. 90 capsule 3  . colchicine 0.6 MG tablet Take 0.6 mg by mouth daily as needed (for gout flare).    Marland Kitchen dextromethorphan-guaiFENesin (MUCINEX DM) 30-600 MG 12hr tablet Take 1 tablet by mouth 2 (two) times daily as needed for cough.    . digoxin (LANOXIN) 0.125 MG tablet TAKE 1 TABLET BY MOUTH EVERY OTHER DAY (KEEP OFFICE VISIT) 90 tablet 3  . diphenhydrAMINE (BENADRYL) 25 mg capsule Take 25 mg by mouth every 6 (six) hours as needed for allergies.     . fish oil-omega-3 fatty acids 1000 MG capsule Take 2 g by mouth daily.    Marland Kitchen  furosemide (LASIX) 20 MG tablet Take 20 mg (one tablet) on Tues, Thursday, Sat and Sunday. Take 40 mg (two tablets) on Mon, Wed, Friday. (Patient taking differently: Take 40 mg by mouth daily. Take 20 mg (one tablet) on Tues, Thursday, Sat and Sunday. Take 40 mg (two tablets) on Mon, Wed, Friday.) 130 tablet 3  . hydrALAZINE (APRESOLINE) 25 MG tablet Take 1 tablet (25 mg total) by mouth 2 (two) times daily. 180 tablet 1  . magnesium oxide (MAG-OX) 400 MG tablet Take 1 tablet (400 mg total) by mouth daily. 90 tablet 3  . montelukast (SINGULAIR) 10 MG tablet Take 1 tablet by mouth daily.  3  . Multiple Vitamin (MULTIVITAMIN WITH MINERALS) TABS Take 1 tablet by mouth daily.    . nitroGLYCERIN (NITROSTAT) 0.4 MG SL tablet Place 0.4 mg under the tongue every 5 (five) minutes as needed for chest pain. (up to 3 doses) For chest pain    . omeprazole (PRILOSEC) 40 MG capsule Take 40 mg by mouth  daily.    . ondansetron (ZOFRAN-ODT) 4 MG disintegrating tablet Take 4 mg by mouth every 4 (four) hours as needed for nausea.     Marland Kitchen spironolactone (ALDACTONE) 25 MG tablet TAKE HALF TABLET DAILY 45 tablet 6  . traMADol (ULTRAM) 50 MG tablet Take 50 mg by mouth every 6 (six) hours as needed.    . warfarin (COUMADIN) 5 MG tablet TAKE 1 TO 1 AND 1/2 TABLETS BY MOUTH DAILY AS DIRECTED BY COUMADIN CLINIC 135 tablet 0   No current facility-administered medications for this visit.     Past Medical History:  Diagnosis Date  . 6949-lead    replaced 02/2011  . Acute on chronic renal insufficiency   . AICD (automatic cardioverter/defibrillator) present    Medtronic CRT  . Asthma   . Atrial flutter ([place])    s/p TEE guided cardioversion  . CHF (congestive heart failure) ([place])   . Chronic drug-induced interstitial lung disorders ([place])   . COPD (chronic obstructive pulmonary disease) ([place])    " very MILD "  . Coronary artery disease    total occlusion of the right coronary artery, left to right collaterals.  He had Cypher stenting to the circumflex in  July 2003  . Family hx of colon cancer    2 SISTERS  . Fatty liver    CT  . Headache(784.0)   . Hyperlipemia   . Hypertension   . Hyponatremia   . Ischemic cardiomyopathy    Myoview 02/2011  EF 28%  Coronary artery disease  (total occlusion of the right coronary artery, left-to-right )  . LBBB (left bundle branch block)   . Nonsustained ventricular tachycardia ([place])   . Seizure disorder [facility])     Past Surgical History:  Procedure Laterality Date  . CORONARY ANGIOPLASTY    . EP IMPLANTABLE DEVICE N/A 03/05/2016   Procedure: ICD  Generator Changeout;  Surgeon: Deboraha Sprang, MD;  Location: [place] CV LAB;  Service: Cardiovascular;  Laterality: N/A;  . ESOPHAGOGASTRODUODENOSCOPY  09/13/2012   Procedure: ESOPHAGOGASTRODUODENOSCOPY (EGD);  Surgeon: Ladene Artist, MD,FACG;  Location: [facility];  Service: Endoscopy;  Laterality: N/A;  .  HERNIA REPAIR    . INSERT / REPLACE / [place]  . RIGHT HEART CATHETERIZATION N/A 09/17/2012   Procedure: RIGHT HEART CATH;  Surgeon: Jolaine Artist, MD;  Location: [facility] CATH LAB;  Service: Cardiovascular;  Laterality: N/A;    ROS:  As  stated in the HPI and negative for all other systems.  PHYSICAL EXAM BP 114/73   Pulse 81   Ht 5\' 8"  (1.727 m)   Wt 174 lb 9.6 oz (79.2 kg)   SpO2 98%   BMI 26.55 kg/m   GENERAL:  Well appearing NECK:  No jugular venous distention, waveform within normal limits, carotid upstroke brisk and symmetric, no bruits, no thyromegaly LUNGS:  Clear to auscultation bilaterally CHEST: Well-healed ICD pocket HEART:  PMI not displaced or sustained,S1 and S2 within normal limits, no S3, no S4, no clicks, no rubs, no murmurs ABD:  Flat, positive bowel sounds normal in frequency in pitch, no bruits, no rebound, no guarding, no midline pulsatile mass, no hepatomegaly, no splenomegaly EXT:  2 plus pulses throughout, no edema, no cyanosis no clubbing    EKG: NA  ASSESSMENT AND PLAN  Chronic Systolic CHF:     He seems to be euvolemic.  No change in therapy.    ICD:    He is up-to-date for follow-up.  No change in therapy.  Coronary Artery Disease:   I do not think that his back pain is an anginal equivalent.  At this point I am not planning further testing unless he has no response to the Prilosec which seems to be working.   Hypertension:   The blood pressure is at target.  No change in therapy.   Atrial Flutter:     He tolerates anticoagulation.  No change in therapy.   Chronic Kidney Disease:    Creat was 1.41 in August.  No change in therapy.

## 2018-09-03 ENCOUNTER — Encounter: Payer: Self-pay | Admitting: Cardiology

## 2018-09-03 ENCOUNTER — Encounter

## 2018-09-03 ENCOUNTER — Ambulatory Visit (INDEPENDENT_AMBULATORY_CARE_PROVIDER_SITE_OTHER): Payer: PPO | Admitting: Cardiology

## 2018-09-03 VITALS — BP 114/73 | HR 81 | Ht 68.0 in | Wt 174.6 lb

## 2018-09-03 DIAGNOSIS — I255 Ischemic cardiomyopathy: Secondary | ICD-10-CM | POA: Diagnosis not present

## 2018-09-03 DIAGNOSIS — N183 Chronic kidney disease, stage 3 unspecified: Secondary | ICD-10-CM

## 2018-09-03 DIAGNOSIS — I10 Essential (primary) hypertension: Secondary | ICD-10-CM

## 2018-09-03 DIAGNOSIS — I2510 Atherosclerotic heart disease of native coronary artery without angina pectoris: Secondary | ICD-10-CM

## 2018-09-03 DIAGNOSIS — I5022 Chronic systolic (congestive) heart failure: Secondary | ICD-10-CM | POA: Diagnosis not present

## 2018-09-03 NOTE — Patient Instructions (Signed)
Medication Instructions:  Continue current medications  If you need a refill on your cardiac medications before your next appointment, please call your pharmacy.  Labwork: None Ordered   If you have labs (blood work) drawn today and your tests are completely normal, you will receive your results only by: Marland Kitchen MyChart Message (if you have MyChart) OR . A paper copy in the mail If you have any lab test that is abnormal or we need to change your treatment, we will call you to review the results.  Testing/Procedures: None Ordered  Follow-Up: You will need a follow up appointment in 4 Months.  Please call our office 2 months in advance([phone]) to schedule the appointment.  You may see  [name] or one of the following Advanced Practice Providers on your designated Care Team:   . Jory Sims, DNP, ANP . Rhonda Barrett, [name] .  Marland Kitchen Kerin Ransom, [name] . Daleen Snook Kroeger, [name] . Sande Rives, [name] .  Marland Kitchen Almyra Deforest, [name] . Fabian Sharp, [name]  At [facility], you and your health needs are our priority.  As part of our continuing mission to provide you with exceptional heart care, we have created designated Provider Care Teams.  These Care Teams include your primary Cardiologist (physician) and Advanced Practice Providers (APPs -  Physician Assistants and Nurse Practitioners) who all work together to provide you with the care you need, when you need it.   Thank you for choosing [facility] at [facility]!!

## 2018-09-07 ENCOUNTER — Other Ambulatory Visit: Payer: Self-pay | Admitting: Cardiology

## 2018-09-09 ENCOUNTER — Ambulatory Visit: Payer: PPO | Admitting: Pharmacist

## 2018-09-09 DIAGNOSIS — Z5181 Encounter for therapeutic drug level monitoring: Secondary | ICD-10-CM | POA: Diagnosis not present

## 2018-09-09 DIAGNOSIS — I480 Paroxysmal atrial fibrillation: Secondary | ICD-10-CM | POA: Diagnosis not present

## 2018-09-09 LAB — POCT INR: INR: 2.9 (ref 2.0–3.0)

## 2018-09-09 NOTE — Patient Instructions (Signed)
Description   Continue taking 1 tablet daily except 1.5 tablet on Saturdays. Eat 3 serving each week of leafy green vegetable.   Recheck INR in 3 weeks. Call us with any changes # [phone].

## 2018-09-16 ENCOUNTER — Telehealth: Payer: Self-pay

## 2018-09-16 ENCOUNTER — Ambulatory Visit (INDEPENDENT_AMBULATORY_CARE_PROVIDER_SITE_OTHER): Payer: PPO | Admitting: *Deleted

## 2018-09-16 DIAGNOSIS — I255 Ischemic cardiomyopathy: Secondary | ICD-10-CM | POA: Diagnosis not present

## 2018-09-16 DIAGNOSIS — I5022 Chronic systolic (congestive) heart failure: Secondary | ICD-10-CM

## 2018-09-16 NOTE — Telephone Encounter (Signed)
LMOVM reminding pt to send remote transmission.   

## 2018-09-17 NOTE — Progress Notes (Signed)
Remote ICD transmission.   

## 2018-09-22 ENCOUNTER — Ambulatory Visit: Payer: PPO | Admitting: Cardiology

## 2018-09-22 DIAGNOSIS — E7800 Pure hypercholesterolemia, unspecified: Secondary | ICD-10-CM | POA: Diagnosis not present

## 2018-09-22 DIAGNOSIS — Z125 Encounter for screening for malignant neoplasm of prostate: Secondary | ICD-10-CM | POA: Diagnosis not present

## 2018-09-22 DIAGNOSIS — Z0000 Encounter for general adult medical examination without abnormal findings: Secondary | ICD-10-CM | POA: Diagnosis not present

## 2018-09-22 DIAGNOSIS — Z1389 Encounter for screening for other disorder: Secondary | ICD-10-CM | POA: Diagnosis not present

## 2018-09-22 DIAGNOSIS — I2510 Atherosclerotic heart disease of native coronary artery without angina pectoris: Secondary | ICD-10-CM | POA: Diagnosis not present

## 2018-09-22 DIAGNOSIS — I10 Essential (primary) hypertension: Secondary | ICD-10-CM | POA: Diagnosis not present

## 2018-09-22 DIAGNOSIS — I4891 Unspecified atrial fibrillation: Secondary | ICD-10-CM | POA: Diagnosis not present

## 2018-09-24 ENCOUNTER — Other Ambulatory Visit: Payer: Self-pay | Admitting: Cardiology

## 2018-09-24 ENCOUNTER — Encounter: Payer: Self-pay | Admitting: Cardiology

## 2018-09-24 NOTE — Telephone Encounter (Signed)
Rx request sent to pharmacy.  

## 2018-09-28 DIAGNOSIS — Z1212 Encounter for screening for malignant neoplasm of rectum: Secondary | ICD-10-CM | POA: Diagnosis not present

## 2018-09-29 ENCOUNTER — Ambulatory Visit: Payer: PPO | Admitting: *Deleted

## 2018-09-29 DIAGNOSIS — I480 Paroxysmal atrial fibrillation: Secondary | ICD-10-CM | POA: Diagnosis not present

## 2018-09-29 DIAGNOSIS — Z5181 Encounter for therapeutic drug level monitoring: Secondary | ICD-10-CM

## 2018-09-29 LAB — POCT INR: INR: 2.3 (ref 2.0–3.0)

## 2018-09-29 NOTE — Patient Instructions (Signed)
Description   Continue taking 1 tablet daily except 1.5 tablet on Saturdays. Eat 3 serving each week of leafy green vegetable.   Recheck INR in 4 weeks. Call us with any changes # [phone].

## 2018-10-22 ENCOUNTER — Telehealth: Payer: Self-pay | Admitting: Internal Medicine

## 2018-10-22 NOTE — Telephone Encounter (Signed)
Pts wife wanted to know if we did virtual colons, pts PCP wants him to have this done. Let her know we do not do these That they are done by radiology. States his PCP will order the virtual colon for him.

## 2018-10-25 DIAGNOSIS — R109 Unspecified abdominal pain: Secondary | ICD-10-CM | POA: Diagnosis not present

## 2018-10-27 ENCOUNTER — Ambulatory Visit: Payer: PPO | Admitting: Pharmacist

## 2018-10-27 ENCOUNTER — Encounter (INDEPENDENT_AMBULATORY_CARE_PROVIDER_SITE_OTHER): Payer: Self-pay

## 2018-10-27 DIAGNOSIS — I480 Paroxysmal atrial fibrillation: Secondary | ICD-10-CM

## 2018-10-27 DIAGNOSIS — Z5181 Encounter for therapeutic drug level monitoring: Secondary | ICD-10-CM | POA: Diagnosis not present

## 2018-10-27 LAB — POCT INR: INR: 2.4 (ref 2.0–3.0)

## 2018-10-27 NOTE — Patient Instructions (Signed)
Continue taking 1 tablet daily except 1.5 tablet on Saturdays. Eat 3 serving each week of leafy green vegetable.   Recheck INR in 5 weeks. Call us with any changes # [phone].

## 2018-11-01 DIAGNOSIS — R7309 Other abnormal glucose: Secondary | ICD-10-CM | POA: Diagnosis not present

## 2018-11-02 ENCOUNTER — Telehealth: Payer: Self-pay | Admitting: Cardiology

## 2018-11-02 DIAGNOSIS — R109 Unspecified abdominal pain: Secondary | ICD-10-CM | POA: Diagnosis not present

## 2018-11-02 DIAGNOSIS — R1013 Epigastric pain: Secondary | ICD-10-CM | POA: Diagnosis not present

## 2018-11-02 DIAGNOSIS — K869 Disease of pancreas, unspecified: Secondary | ICD-10-CM | POA: Diagnosis not present

## 2018-11-02 DIAGNOSIS — I700 Atherosclerosis of aorta: Secondary | ICD-10-CM | POA: Diagnosis not present

## 2018-11-02 DIAGNOSIS — J90 Pleural effusion, not elsewhere classified: Secondary | ICD-10-CM | POA: Diagnosis not present

## 2018-11-02 NOTE — Telephone Encounter (Signed)
Noted- spoke to pt. No need to hold warfarin- follow up at ordinally scheduled appointment

## 2018-11-02 NOTE — Telephone Encounter (Signed)
  Patient calling to make Coumadin Clinic aware he has a CT scheduled for today.

## 2018-11-05 ENCOUNTER — Telehealth: Payer: Self-pay | Admitting: Cardiology

## 2018-11-05 DIAGNOSIS — N281 Cyst of kidney, acquired: Secondary | ICD-10-CM | POA: Diagnosis not present

## 2018-11-05 DIAGNOSIS — R109 Unspecified abdominal pain: Secondary | ICD-10-CM | POA: Diagnosis not present

## 2018-11-05 DIAGNOSIS — J90 Pleural effusion, not elsewhere classified: Secondary | ICD-10-CM | POA: Diagnosis not present

## 2018-11-05 NOTE — Telephone Encounter (Signed)
New Message         Patient was told to call after having his ultrasound, he would like a call back.

## 2018-11-05 NOTE — Telephone Encounter (Signed)
PHONE BUSY.

## 2018-11-05 NOTE — Telephone Encounter (Signed)
New message   Pt c/o swelling: STAT is pt has developed SOB within 24 hours  1) How much weight have you gained and in what time span? 2 to 3 pounds within the last several days  2) If swelling, where is the swelling located? ankles  3) Are you currently taking a fluid pill?yes   4) Are you currently SOB? Yes   5) Do you have a log of your daily weights (if so, list)? Yes, can provide if needed   6) Have you gained 3 pounds in a day or 5 pounds in a week? Yes   7) Have you traveled recently? No

## 2018-11-05 NOTE — Telephone Encounter (Signed)
Spoke to patient- he sttes she had ultrasound today after test he took  Daily dose of 40 mg today . No symptoms of shortness of breathe. informed patient to  Per protocol, take an extra 20 mg tomorrow if weight is still 3 lbs heavier in the morning. Patient verbalized understanding.   patient states they have to do further testing from the ultrasound

## 2018-11-05 NOTE — Telephone Encounter (Signed)
Returned call to pt he states that his weight is up today but he was instructed by ultrasound department to hold today since he is having a [place] today. He states that he is not having any SOB just the swelling he is up about 3# only but he did not take Tuesday since he had to take contrast for another test. He think s that this is why his weight is up. He is going to take the lasix with him to the [place] so he can take these ASAP and be able to "start when he gets home"  He did not know if he should have called today but, he will call back if any sx develop

## 2018-11-08 ENCOUNTER — Telehealth: Payer: Self-pay | Admitting: Cardiology

## 2018-11-08 NOTE — Telephone Encounter (Signed)
New message   Patient states that he needs a new hospital bed matress ordered. Please call to discuss.

## 2018-11-08 NOTE — Telephone Encounter (Signed)
Attempted to contact pt. Line ringing busy.

## 2018-11-09 DIAGNOSIS — K591 Functional diarrhea: Secondary | ICD-10-CM | POA: Diagnosis not present

## 2018-11-09 DIAGNOSIS — R1013 Epigastric pain: Secondary | ICD-10-CM | POA: Diagnosis not present

## 2018-11-12 ENCOUNTER — Telehealth: Payer: Self-pay | Admitting: Cardiology

## 2018-11-12 DIAGNOSIS — R1011 Right upper quadrant pain: Secondary | ICD-10-CM | POA: Diagnosis not present

## 2018-11-12 DIAGNOSIS — R1013 Epigastric pain: Secondary | ICD-10-CM | POA: Diagnosis not present

## 2018-11-12 NOTE — Telephone Encounter (Signed)
New message      Pt c/o swelling: STAT is pt has developed SOB within 24 hours  1) How much weight have you gained and in what time span? 170 no changes   2) If swelling, where is the swelling located? Ankles and lungs   3) Are you currently taking a fluid pill? Yes , furosimide 20 mg then 40 mwf   4) Are you currently SOB? Some   5) Do you have a log of your daily weights (if so, list)? Yes , 168, 169, 170  6) Have you gained 3 pounds in a day or 5 pounds in a week? No          7) Have you traveled recently?no

## 2018-11-12 NOTE — Telephone Encounter (Signed)
Spoke with pt, he called to report swelling in his feet and ankles. Patient reports he has been having a lot of stomach pain, belching and reflux. He has a CT scan recently and was told he had fluid in his lungs and he was also told his kidney function was up. This was all done at [facility]. His weight today is 169.8lb and he reports SOB. He has just taken his furosemide for today due to testing this morning. Advised the patient to wait and see what his fluid pill for today does and to call his medical doctor because we can see the kidney function or CT scan. Sodium and fluid restriction discussed and patient agreed with this plan.

## 2018-11-15 ENCOUNTER — Telehealth: Payer: Self-pay | Admitting: *Deleted

## 2018-11-15 ENCOUNTER — Telehealth: Payer: Self-pay | Admitting: Physician Assistant

## 2018-11-15 ENCOUNTER — Ambulatory Visit (INDEPENDENT_AMBULATORY_CARE_PROVIDER_SITE_OTHER): Payer: PPO | Admitting: Physician Assistant

## 2018-11-15 ENCOUNTER — Encounter

## 2018-11-15 ENCOUNTER — Other Ambulatory Visit (INDEPENDENT_AMBULATORY_CARE_PROVIDER_SITE_OTHER): Payer: PPO

## 2018-11-15 ENCOUNTER — Encounter: Payer: Self-pay | Admitting: Physician Assistant

## 2018-11-15 VITALS — BP 100/60 | HR 72 | Ht 68.25 in | Wt 176.2 lb

## 2018-11-15 DIAGNOSIS — R1084 Generalized abdominal pain: Secondary | ICD-10-CM | POA: Diagnosis not present

## 2018-11-15 DIAGNOSIS — R197 Diarrhea, unspecified: Secondary | ICD-10-CM

## 2018-11-15 DIAGNOSIS — K55059 Acute (reversible) ischemia of intestine, part and extent unspecified: Secondary | ICD-10-CM

## 2018-11-15 LAB — CBC WITH DIFFERENTIAL/PLATELET
BASOS PCT: 1.1 % (ref 0.0–3.0)
Basophils Absolute: 0.1 10*3/uL (ref 0.0–0.1)
Eosinophils Absolute: 0 10*3/uL (ref 0.0–0.7)
Eosinophils Relative: 0.5 % (ref 0.0–5.0)
HCT: 36.8 % — ABNORMAL LOW (ref 39.0–52.0)
HEMOGLOBIN: 12.2 g/dL — AB (ref 13.0–17.0)
Lymphocytes Relative: 24.5 % (ref 12.0–46.0)
Lymphs Abs: 1.2 10*3/uL (ref 0.7–4.0)
MCHC: 33.2 g/dL (ref 30.0–36.0)
MCV: 99.8 fl (ref 78.0–100.0)
Monocytes Absolute: 0.7 10*3/uL (ref 0.1–1.0)
Monocytes Relative: 14.2 % — ABNORMAL HIGH (ref 3.0–12.0)
Neutro Abs: 3 10*3/uL (ref 1.4–7.7)
Neutrophils Relative %: 59.7 % (ref 43.0–77.0)
Platelets: 255 10*3/uL (ref 150.0–400.0)
RBC: 3.69 Mil/uL — ABNORMAL LOW (ref 4.22–5.81)
RDW: 15.4 % (ref 11.5–15.5)
WBC: 5 10*3/uL (ref 4.0–10.5)

## 2018-11-15 LAB — BRAIN NATRIURETIC PEPTIDE: Pro B Natriuretic peptide (BNP): 1353 pg/mL — ABNORMAL HIGH (ref 0.0–100.0)

## 2018-11-15 LAB — BASIC METABOLIC PANEL
BUN: 27 mg/dL — ABNORMAL HIGH (ref 6–23)
CO2: 27 mEq/L (ref 19–32)
Calcium: 9.2 mg/dL (ref 8.4–10.5)
Chloride: 96 mEq/L (ref 96–112)
Creatinine, Ser: 1.71 mg/dL — ABNORMAL HIGH (ref 0.40–1.50)
GFR: 41.79 mL/min — ABNORMAL LOW (ref >60.00)
Glucose, Bld: 102 mg/dL — ABNORMAL HIGH (ref 70–99)
Potassium: 5.2 mEq/L — ABNORMAL HIGH (ref 3.5–5.1)
Sodium: 129 mEq/L — ABNORMAL LOW (ref 135–145)

## 2018-11-15 MED ORDER — DICYCLOMINE HCL 10 MG PO CAPS: ORAL_CAPSULE | ORAL | 1 refills | Status: DC

## 2018-11-15 MED ORDER — OMEPRAZOLE 40 MG PO CPDR: 40.0000 mg | DELAYED_RELEASE_CAPSULE | Freq: Every morning | ORAL | 2 refills | Status: AC

## 2018-11-15 MED ORDER — TRAMADOL HCL 50 MG PO TABS: ORAL_TABLET | ORAL | 0 refills | Status: DC

## 2018-11-15 MED ORDER — OMEPRAZOLE 40 MG PO CPDR: 40.0000 mg | DELAYED_RELEASE_CAPSULE | Freq: Every morning | ORAL | 2 refills | Status: DC

## 2018-11-15 NOTE — Telephone Encounter (Signed)
Resent prescription for Bentyl 10 mg to [facility], [place], [place].

## 2018-11-15 NOTE — Patient Instructions (Addendum)
Please go to the basement level to have your labs drawn and stool studies.  We sent prescriptions to [facility].  1. Prilosec 40 mg 2. Bentyl ( dicyclomine) 10 mg 3. Ultram 50 mg for pain.   Use Imodium as needed for diarrhea. Bland diet.   We have made you an appointment with [facility] at [place] with a Kerin Ransom [name] for [place] Diet A bland diet consists of foods that are often soft and do not have a lot of fat, fiber, or extra seasonings. Foods without fat, fiber, or seasoning are easier for the body to digest. They are also less likely to irritate your mouth, throat, stomach, and other parts of your digestive system. A bland diet is sometimes called a BRAT diet. What is my plan? Your health care provider or food and nutrition specialist (dietitian) may recommend specific changes to your diet to prevent symptoms or to treat your symptoms. These changes may include:  Eating small meals often.  Cooking food until it is soft enough to chew easily.  Chewing your food well.  Drinking fluids slowly.  Not eating foods that are very spicy, sour, or fatty.  Not eating citrus fruits, such as oranges and grapefruit. What do I need to know about this diet?  Eat a variety of foods from the bland diet food list.  Do not follow a bland diet longer than needed.  Ask your health care provider whether you should take vitamins or supplements. What foods can I eat? Grains  Hot cereals, such as cream of wheat. Rice. Bread, crackers, or tortillas made from refined white flour. Vegetables Canned or cooked vegetables. Mashed or boiled potatoes. Fruits  Bananas. Applesauce. Other types of cooked or canned fruit with the skin and seeds removed, such as canned peaches or pears. Meats and other proteins  Scrambled eggs. Creamy peanut butter or other nut butters. Lean, well-cooked meats, such as chicken or fish. Tofu. Soups or broths. Dairy Low-fat dairy  products, such as milk, cottage cheese, or yogurt. Beverages  Water. Herbal tea. Apple juice. Fats and oils Mild salad dressings. Canola or olive oil. Sweets and desserts Pudding. Custard. Fruit gelatin. Ice cream. The items listed above may not be a complete list of recommended foods and beverages. Contact a dietitian for more options. What foods are not recommended? Grains Whole grain breads and cereals. Vegetables Raw vegetables. Fruits Raw fruits, especially citrus, berries, or dried fruits. Dairy Whole fat dairy foods. Beverages Caffeinated drinks. Alcohol. Seasonings and condiments Strongly flavored seasonings or condiments. Hot sauce. Salsa. Other foods Spicy foods. Fried foods. Sour foods, such as pickled or fermented foods. Foods with high sugar content. Foods high in fiber. The items listed above may not be a complete list of foods and beverages to avoid. Contact a dietitian for more information. Summary  A bland diet consists of foods that are often soft and do not have a lot of fat, fiber, or extra seasonings.  Foods without fat, fiber, or seasoning are easier for the body to digest.  Check with your health care provider to see how long you should follow this diet plan. It is not meant to be followed for long periods. This information is not intended to replace advice given to you by your health care provider. Make sure you discuss any questions you have with your health care provider. Document Released: 02/25/2016 Document Revised: 12/02/2017 Document Reviewed: 12/02/2017 Elsevier Interactive Patient Education  2019 [place]. ursday 11-18-2018 .  Arrive at 11:15 am.   Normal BMI (Body Mass Index- based on height and weight) is between 23 and 30. Your BMI today is Body mass index is 26.6 kg/m. Marland Kitchen Please consider follow up  regarding your BMI with your Primary Care Provider.

## 2018-11-15 NOTE — Telephone Encounter (Signed)
Called [facility], [place], [place].  Bentyl ( dicyclomine 10 mg) Called in script, it would not go electronically.

## 2018-11-16 ENCOUNTER — Encounter: Payer: Self-pay | Admitting: Physician Assistant

## 2018-11-16 LAB — CUP PACEART REMOTE DEVICE CHECK
Battery Remaining Longevity: 50 mo
Battery Voltage: 2.96 V
Brady Statistic AP VP Percent: 0 %
Brady Statistic AP VS Percent: 0 %
Brady Statistic AS VP Percent: 91.68 %
Brady Statistic AS VS Percent: 8.32 %
Brady Statistic RA Percent Paced: 0 %
Date Time Interrogation Session: 20191031203630
HighPow Impedance: 47 Ohm
HighPow Impedance: 61 Ohm
Implantable Lead Implant Date: 20071010
Implantable Lead Implant Date: 20071010
Implantable Lead Implant Date: 20120419
Implantable Lead Location: 753858
Implantable Lead Location: 753859
Implantable Lead Location: 753860
Implantable Lead Model: 4194
Implantable Lead Model: 5076
Implantable Lead Model: 7121
Implantable Pulse Generator Implant Date: 20170419
Lead Channel Impedance Value: 361 Ohm
Lead Channel Impedance Value: 361 Ohm
Lead Channel Impedance Value: 456 Ohm
Lead Channel Impedance Value: 532 Ohm
Lead Channel Impedance Value: 779 Ohm
Lead Channel Pacing Threshold Amplitude: 0.625 V
Lead Channel Pacing Threshold Amplitude: 1.125 V
Lead Channel Pacing Threshold Pulse Width: 0.4 ms
Lead Channel Pacing Threshold Pulse Width: 0.4 ms
Lead Channel Sensing Intrinsic Amplitude: 16 mV
Lead Channel Sensing Intrinsic Amplitude: 16 mV
Lead Channel Sensing Intrinsic Amplitude: 4.5 mV
Lead Channel Sensing Intrinsic Amplitude: 4.5 mV
Lead Channel Setting Pacing Amplitude: 2.25 V
Lead Channel Setting Pacing Amplitude: 2.5 V
Lead Channel Setting Pacing Pulse Width: 0.4 ms
Lead Channel Setting Sensing Sensitivity: 0.3 mV
MDC IDC MSMT LEADCHNL RV IMPEDANCE VALUE: 475 Ohm
MDC IDC SET LEADCHNL LV PACING PULSEWIDTH: 0.4 ms
MDC IDC STAT BRADY RV PERCENT PACED: 89.94 %

## 2018-11-16 NOTE — Progress Notes (Addendum)
Subjective:    Patient ID: [name], male    DOB: 1946-09-10, 72 y.o.   MRN: [id number]  HPI [name] is a pleasant 72 year old white male, known to Dr. Hilarie Fredrickson and myself.  He was last seen in our office in fall 2013 and was admitted from the office with acute exacerbation of congestive heart failure. He comes in today with complaints of epigastric pain over the past month, somewhat worse postprandially, and also awakening him at night.  He is also been having diarrhea over the past month.  Both he and his wife feel that the symptoms are similar to symptoms he had in 2013 when he was admitted and felt to have nonocclusive mesenteric ischemia accounting for his GI symptoms. Patient has history of severe ischemic cardiomyopathy with EF of 25-to 35% with last echo done in 2017.  Apparently LVAD had been discussed in the past which he declined.  He is chronically anticoagulated with Coumadin has history of A. fib flutter and chronic kidney disease. EGD in October 2013 was normal.  Unenhanced CT of the abdomen and pelvis in 2013 showed atherosclerotic changes of the aorta, SMA and iliacs and hepatic steatosis. Patient has not had any GI issues since 2013 until the past month or so. He complains of a pain from the hypogastrium up to the sternum which is present all day long and dull in nature.  He will occasionally get episodes of more severe stabbing type pain that is very brief.  He is having some of these episodes waking him from sleep he says he feels short of breath at that time and has to get up to try to catch his breath.  He has not had any vomiting or fever no heartburn or indigestion His appetite is been decreased and he has been eating a bland diet.  He also describes some abdominal cramping and gassiness and over the past 2 weeks has been having daily loose stools with 4-5 bowel movements per day no melena or hematochezia.  He has tried Kaopectate and Imodium both as needed has been afraid to  stay on them for fear of constipation.  He has had some milder diarrhea off and on over the past year. He also complains of pain bilaterally in his calves at night that sometimes awakens him his wife says his lower extremities have been swollen over the past couple of weeks.  Patient says he has been more short of breath over the past few weeks as well.  CT of the abdomen and pelvis was recently done by his PCP on 10/29/2018 shows enlarged heart small bilateral pleural effusions, mild gallbladder wall thickening no ductal dilation.  There is a stable 2.7 x 1.4 cm cystic lesion in the body of the pancreas lateral renal cysts, trace free fluid in the abdomen and pelvis.  Patient had been referred to surgeon/Dr. Dorathy Daft in [facility] regarding possible gallbladder disease.  Ultrasound was done on 11/05/2018 and showing mild wall thickening of the gallbladder without Murphy sign no evident gallstones.  [facility] HIDA scan 11/11/2018 normal gallbladder ejection fraction of 84% Patient states that Dr. Leininger/surgery did not feel that his gallbladder accounted for his current symptoms.  Review of Systems Pertinent positive and negative review of systems were noted in the above HPI section.  All other review of systems was otherwise negative.  Outpatient Encounter Medications as of 11/15/2018  Medication Sig  . allopurinol (ZYLOPRIM) 100 MG tablet Take 100 mg by mouth daily.  . Ascorbic  Acid (VITAMIN C) POWD Take 1 Dose by mouth daily.   . ASTRAGALUS PO Take 1 Dose by mouth daily.   . bisoprolol (ZEBETA) 5 MG tablet TAKE 1/2 TABLET BY MOUTH DAILY  . Calcium Carbonate Antacid (MAALOX PO) Take 1 Dose by mouth as needed.  . Calcium-Magnesium 500-250 MG TABS Take 1 tablet by mouth daily.   . carvedilol (COREG CR) 20 MG 24 hr capsule TAKE 1 CAPSULE BY MOUTH EVERY DAY  . dextromethorphan-guaiFENesin (MUCINEX DM) 30-600 MG 12hr tablet Take 1 tablet by mouth 2 (two) times daily as needed for cough.  . digoxin  (LANOXIN) 0.125 MG tablet TAKE 1 TABLET BY MOUTH EVERY OTHER DAY (KEEP OFFICE VISIT)  . diphenhydrAMINE (BENADRYL) 25 mg capsule Take 25 mg by mouth every 6 (six) hours as needed for allergies.   . fish oil-omega-3 fatty acids 1000 MG capsule Take 2 g by mouth daily.  . furosemide (LASIX) 20 MG tablet Take 20 mg (one tablet) on Tues, Thursday, Sat and Sunday. Take 40 mg (two tablets) on Mon, Wed, Friday. (Patient taking differently: Take 40 mg by mouth daily. Take 20 mg (one tablet) on Tues, Thursday, Sat and Sunday. Take 40 mg (two tablets) on Mon, Wed, Friday.)  . hydrALAZINE (APRESOLINE) 25 MG tablet Take 1 tablet (25 mg total) by mouth 2 (two) times daily.  Marland Kitchen loperamide (IMODIUM A-D) 2 MG tablet Take 2 mg by mouth as needed for diarrhea or loose stools.  . magnesium oxide (MAG-OX) 400 MG tablet Take 1 tablet (400 mg total) by mouth daily.  . montelukast (SINGULAIR) 10 MG tablet Take 1 tablet by mouth daily.  . Multiple Vitamin (MULTIVITAMIN WITH MINERALS) TABS Take 1 tablet by mouth daily.  . nitroGLYCERIN (NITROSTAT) 0.4 MG SL tablet Place 0.4 mg under the tongue every 5 (five) minutes as needed for chest pain. (up to 3 doses) For chest pain  . omeprazole (PRILOSEC) 40 MG capsule Take 1 capsule (40 mg total) by mouth every morning.  . ondansetron (ZOFRAN-ODT) 4 MG disintegrating tablet Take 4 mg by mouth every 4 (four) hours as needed for nausea.   Marland Kitchen spironolactone (ALDACTONE) 25 MG tablet TAKE HALF TABLET DAILY  . traMADol (ULTRAM) 50 MG tablet Take 50 mg by mouth every 6 (six) hours as needed.  . warfarin (COUMADIN) 5 MG tablet TAKE 1 TO 1 AND 1/2 TABLETS BY MOUTH DAILY AS DIRECTED BY COUMADIN CLINIC  . [DISCONTINUED] omeprazole (PRILOSEC) 40 MG capsule Take 40 mg by mouth daily.  . [DISCONTINUED] omeprazole (PRILOSEC) 40 MG capsule Take 1 capsule (40 mg total) by mouth every morning.  . dicyclomine (BENTYL) 10 MG capsule Take 1 tablet by mouth 3 times daily as needed for diarrhea.  .  traMADol (ULTRAM) 50 MG tablet Take 1 tablet by mouth every 6 hours as needed for pain.  . [DISCONTINUED] bisoprolol (ZEBETA) 5 MG tablet TAKE 1/2 TABLET BY MOUTH DAILY  . [DISCONTINUED] colchicine 0.6 MG tablet Take 0.6 mg by mouth daily as needed (for gout flare).  . [DISCONTINUED] dicyclomine (BENTYL) 10 MG capsule Take 1 tablet by mouth 3 times daily as needed for diarrhea.  . [DISCONTINUED] dicyclomine (BENTYL) 10 MG capsule Take 1 tablet by mouth 3 times daily as needed for diarrhea.   No facility-administered encounter medications on file as of 11/15/2018.    Allergies  Allergen Reactions  . Sulfa Antibiotics Shortness Of Breath  . Ace Inhibitors     Unknown  . Corzide [Nadolol-Bendroflumethiazide]  Unknown  . Diovan [Valsartan]     Unknown  . Flagyl [Metronidazole] Other (See Comments)    Unknown  . Omeprazole     Shut my kidneys down  . Other     Perfumes smells; paint smells; formaldehyde smells  . Shellfish Allergy   . Tape Other (See Comments)  . Amlodipine Besylate Other (See Comments)    Numbness to lips; made legs give out  . Codeine Itching and Rash    Itching  . Latex Itching and Rash  . Norvasc [Amlodipine Besylate] Other (See Comments)    Numbness to lips; made legs give out  . Penicillins Rash    Childhood reaction   Patient Active Problem List   Diagnosis Date Noted  . Essential hypertension 12/14/2017  . ICD (implantable cardioverter-defibrillator) battery depletion 05/27/2017  . Coronary artery disease involving native coronary artery of native heart without angina pectoris 05/27/2017  . CKD (chronic kidney disease), stage III ([place]) 05/27/2017  . Encounter for therapeutic drug monitoring 02/22/2014  . NSVT (nonsustained ventricular tachycardia) ([place]) 09/19/2012  . Transaminitis 09/10/2012  . Encounter for long-term (current) use of anticoagulants 06/04/2011  . ICD BiV  medtronic   . Ischemic cardiomyopathy   . SYSTOLIC HEART FAILURE, CHRONIC  09/04/2009  . UNSPECIFIED ANEMIA 05/14/2009  . FIBRILLATION, ATRIAL 01/31/2009  . Coronary atherosclerosis 01/30/2009   Social History   Socioeconomic History  . Marital status: Married    Spouse name: Not on file  . Number of children: 1  . Years of education: Not on file  . Highest education level: Not on file  Occupational History  . Occupation: RETIRED    Employer: RETIRED  Social Needs  . Financial resource strain: Not on file  . Food insecurity:    Worry: Not on file    Inability: Not on file  . Transportation needs:    Medical: Not on file    Non-medical: Not on file  Tobacco Use  . Smoking status: Never Smoker  . Smokeless tobacco: Never Used  Substance and Sexual Activity  . Alcohol use: Yes    Comment: OCCASIONALLY NOT OFTEN  . Drug use: No  . Sexual activity: Not on file  Lifestyle  . Physical activity:    Days per week: Not on file    Minutes per session: Not on file  . Stress: Not on file  Relationships  . Social connections:    Talks on phone: Not on file    Gets together: Not on file    Attends religious service: Not on file    Active member of club or organization: Not on file    Attends meetings of clubs or organizations: Not on file    Relationship status: Not on file  . Intimate partner violence:    Fear of current or ex partner: Not on file    Emotionally abused: Not on file    Physically abused: Not on file    Forced sexual activity: Not on file  Other Topics Concern  . Not on file  Social History Narrative  . Not on file    Mr. Wirt family history includes Colon cancer in his sister and sister; Heart disease in his father and mother.      Objective:    Vitals:   11/15/18 0823  BP: 100/60  Pulse: 72  SpO2: 97%    Physical Exam; well-developed older white male in no acute distress, accompanied by his wife, both pleasant.  Blood pressure 100/60 pulse  72, BMI 26.6.  HEENT; nontraumatic normocephalic EOMI PERRLA sclera  anicteric, he is pale, oral mucosa moist, Cardiovascular ;;ir regular rate and rhythm with S1-S2.  Pulmonary; clear bilaterally somewhat decreased breath sounds bilateral bases, AICD in the chest wall.  Abdomen; soft, nondistended bowel sounds are present no audible bruit, there is no focal tenderness no mass or palpable hepatosplenomegaly.  Rectal; exam not done, Extremities; 2+ edema bilateral lower extremities to the shins.  Neuropsych ;alert and oriented, grossly nonfocal mood and affect appropriate       Assessment & Plan:   #7 72 year old white male with severe ischemic cardiomyopathy with EF 25 to 35% (last echo 2017) presenting with 1 month history of change in bowel habits with frequent loose stools, decrease in appetite, constant dull mid abdominal pain sometimes radiating to the sternum, worse postprandially and also waking him at night. Patient had a similar presentation in 2013 and was admitted with an acute exacerbation of congestive heart failure GI symptoms were felt secondary to nonocclusive mesenteric ischemia.  GI symptoms improved with improved cardiac function.  He did not undergo formal vascular studies.  Patient has noticed increase in shortness of breath over the past month and new lower extremity edema also concerning for progression of this of heart failure/cardiomyopathy  CT of the abdomen and pelvis, abdominal ultrasound and [facility] HIDA scan all done within the past week with no acute findings in the abdomen, no evidence of cholecystitis and noted stable pancreatic body cyst.  #2 status post pacemaker/AICD 3.  Chronic anticoagulation-Coumadin 4.  Atrial fib flutter 5.  Chronic kidney disease  Plan; CBC with differential, CMet, BNP, GI pathogen panel Start fiber supplement once daily Bentyl 10 mg p.o. 3 times daily before meals Start Prilosec 20 mg p.o. every morning He is advised to use Imodium on a as needed basis up to 4 daily Patient needs cardiac evaluation I  suspect that sending ischemic cardiomyopathy is primary issue secondary low flow state to the gut/nonocclusive mesenteric ischemia. We have arranged urgent cardiac follow-up later this week through  Dr. Rosezella [place] office.   [name] S [name] [name] 11/16/2018   Addendum: Reviewed and agree with assessment and management plan. [name], [name] Lines, MD     Cc: Nona Dell, Corene Cornea, MD

## 2018-11-18 ENCOUNTER — Telehealth: Payer: Self-pay | Admitting: Physician Assistant

## 2018-11-18 ENCOUNTER — Encounter: Payer: Self-pay | Admitting: Cardiology

## 2018-11-18 ENCOUNTER — Other Ambulatory Visit: Payer: PPO

## 2018-11-18 ENCOUNTER — Ambulatory Visit (INDEPENDENT_AMBULATORY_CARE_PROVIDER_SITE_OTHER): Payer: PPO | Admitting: Cardiology

## 2018-11-18 VITALS — BP 102/70 | HR 73 | Ht 68.25 in | Wt 174.0 lb

## 2018-11-18 DIAGNOSIS — R1084 Generalized abdominal pain: Secondary | ICD-10-CM | POA: Diagnosis not present

## 2018-11-18 DIAGNOSIS — I5023 Acute on chronic systolic (congestive) heart failure: Secondary | ICD-10-CM

## 2018-11-18 DIAGNOSIS — N183 Chronic kidney disease, stage 3 unspecified: Secondary | ICD-10-CM

## 2018-11-18 DIAGNOSIS — Z9861 Coronary angioplasty status: Secondary | ICD-10-CM | POA: Diagnosis not present

## 2018-11-18 DIAGNOSIS — I2510 Atherosclerotic heart disease of native coronary artery without angina pectoris: Secondary | ICD-10-CM | POA: Diagnosis not present

## 2018-11-18 DIAGNOSIS — Z95810 Presence of automatic (implantable) cardiac defibrillator: Secondary | ICD-10-CM | POA: Diagnosis not present

## 2018-11-18 DIAGNOSIS — R197 Diarrhea, unspecified: Secondary | ICD-10-CM | POA: Diagnosis not present

## 2018-11-18 DIAGNOSIS — I255 Ischemic cardiomyopathy: Secondary | ICD-10-CM

## 2018-11-18 DIAGNOSIS — I480 Paroxysmal atrial fibrillation: Secondary | ICD-10-CM | POA: Diagnosis not present

## 2018-11-18 LAB — GASTROINTESTINAL PATHOGEN PANEL PCR
C. difficile Tox A/B, PCR: NOT DETECTED
Campylobacter, PCR: NOT DETECTED
Cryptosporidium, PCR: NOT DETECTED
E coli (ETEC) LT/ST PCR: NOT DETECTED
E coli (STEC) stx1/stx2, PCR: NOT DETECTED
E coli 0157, PCR: NOT DETECTED
Giardia lamblia, PCR: NOT DETECTED
NOROVIRUS, PCR: NOT DETECTED
Rotavirus A, PCR: NOT DETECTED
Salmonella, PCR: NOT DETECTED
Shigella, PCR: NOT DETECTED

## 2018-11-18 MED ORDER — FUROSEMIDE 40 MG PO TABS: 40.0000 mg | ORAL_TABLET | Freq: Every day | ORAL | 3 refills | Status: DC

## 2018-11-18 NOTE — Assessment & Plan Note (Signed)
CFX PCI in 2003, CTO of RCA with collaterals

## 2018-11-18 NOTE — Assessment & Plan Note (Signed)
Currently V paced

## 2018-11-18 NOTE — Assessment & Plan Note (Signed)
Coronary artery disease status post Cypher drug-eluting stent to     left circumflex in 2003 with known right coronary artery total with     left-to-right collaterals and no other disease per cardiac     catheterization in 2003.  Myoview April 2012-no ischemia ejection fraction 28% inferolateral scar Echo 2017- EF 35-40%

## 2018-11-18 NOTE — Telephone Encounter (Signed)
Patient has has blisters inside the lips. He said it happened after the 3 dose. He did have improvement of his GI symptoms. He has not taken anymore Bentyl. He asks if there is a similar medication he can try[facility] [address] [facility] [place], [place] He is on his way to the cardiology appointment. He is aware of his lab results.  Please advise on Bentyl.

## 2018-11-18 NOTE — Patient Instructions (Signed)
Medication Instructions:  Take an additional 60 mg (3 tablets) of Lasix when you arrive home today 80 mg (4 tablets) of Lasix tomorrow Friday 11-19-2018 40 mg daily starting Saturday 11-20-2018  If you need a refill on your cardiac medications before your next appointment, please call your pharmacy.   Lab work:  If you have labs (blood work) drawn today and your tests are completely normal, you will receive your results only by: Marland Kitchen MyChart Message (if you have MyChart) OR . A paper copy in the mail If you have any lab test that is abnormal or we need to change your treatment, we will call you to review the results.  Testing/Procedures: Your physician has requested that you have an echocardiogram. Echocardiography is a painless test that uses sound waves to create images of your heart. It provides your doctor with information about the size and shape of your heart and how well your heart's chambers and valves are working. This procedure takes approximately one hour. There are no restrictions for this procedure. STAT ECHO [place] [address]  Follow-Up: At [facility], you and your health needs are our priority.  As part of our continuing mission to provide you with exceptional heart care, we have created designated Provider Care Teams.  These Care Teams include your primary Cardiologist (physician) and Advanced Practice Providers (APPs -  Physician Assistants and Nurse Practitioners) who all work together to provide you with the care you need, when you need it. . You will need a follow up appointment in 3 months with  Dr. Percival Spanish.  . Your physician recommends that you schedule a follow-up appointment in: Kerin Ransom, [name] after ECHO .   Any Other Special Instructions Will Be Listed Below (If Applicable).

## 2018-11-18 NOTE — Progress Notes (Signed)
11/18/2018 [name]   Jun 29, 1946  [id number]  Primary Physician [name], MD Primary Cardiologist: [name]- [name]  HPI:  The patient is a pleasant 73 year old male followed by Dr. Percival Spanish and Dr. Caryl Comes.  He has a long history of cardiomyopathy.  He had a CFX PCI in 2003.  Catheterization revealed a chronic total occlusion of the RCA with left-to-right collaterals.  He had an ICD placed in 2007 with a generator change in 2012 and a Bi V ICD implanted as well as lead replacement in 2017.  His last ejection fraction by echo in January 2017 showed his EF improved to 35 to 40%.  Other medical problems include a history of PAF, hypertension, and chronic renal insufficiency.  He is on chronic Coumadin anticoagulation.  Recently he is developed a constellation of symptoms including nausea abdominal pain lower extremity edema and shortness of breath.  He had some studies done at [facility].  This showed a stable blood count with a hemoglobin of 12, a white count of 7.2, BUN 25, creatinine 1.55 and normal liver functions amylase and lipase.  Abdominal ultrasound showed some gallbladder thickening and a HIDA scan was obtained.  He has a cystic lesion adjacent to the head of the pancreas which appeared unchanged.  There was a small right pleural effusion.  He tells me his HIDA scan was negative although I do not have that actual report.  He was seen by Nicoletta Ba at low [facility].  She ordered labs including a BNP as she was concerned his symptoms may be from low cardiac output and causing abdominal ischemia and congestive heart failure.  Labs done on December 30 revealed sodium 129, potassium 5.2, BUN 27, creatinine 1.7, BNP of 1353 and hemoglobin of 12 with a normal white count.  His INR has been stable, his last INR was 2.4 on December 11.  The patient is seen in the office today for further evaluation.  His wife accompanied him.  He admits he has not been eating well.  He  denies orthopnea.  He does have dyspnea on exertion.  He does have lower extremity edema which he has not had in the past.    Current Outpatient Medications  Medication Sig Dispense Refill  . allopurinol (ZYLOPRIM) 100 MG tablet Take 100 mg by mouth daily.    . Ascorbic Acid (VITAMIN C) POWD Take 1 Dose by mouth daily.     . ASTRAGALUS PO Take 1 Dose by mouth daily.     . bisoprolol (ZEBETA) 5 MG tablet TAKE 1/2 TABLET BY MOUTH DAILY 90 tablet 3  . Calcium Carbonate Antacid (MAALOX PO) Take 1 Dose by mouth as needed.    . Calcium-Magnesium 500-250 MG TABS Take 1 tablet by mouth daily.     . carvedilol (COREG CR) 20 MG 24 hr capsule TAKE 1 CAPSULE BY MOUTH EVERY DAY 90 capsule 3  . dextromethorphan-guaiFENesin (MUCINEX DM) 30-600 MG 12hr tablet Take 1 tablet by mouth 2 (two) times daily as needed for cough.    . digoxin (LANOXIN) 0.125 MG tablet TAKE 1 TABLET BY MOUTH EVERY OTHER DAY (KEEP OFFICE VISIT) 90 tablet 3  . diphenhydrAMINE (BENADRYL) 25 mg capsule Take 25 mg by mouth every 6 (six) hours as needed for allergies.     . fish oil-omega-3 fatty acids 1000 MG capsule Take 2 g by mouth daily.    . hydrALAZINE (APRESOLINE) 25 MG tablet Take 1 tablet (25 mg total) by  mouth 2 (two) times daily. 180 tablet 1  . loperamide (IMODIUM A-D) 2 MG tablet Take 2 mg by mouth as needed for diarrhea or loose stools.    . magnesium oxide (MAG-OX) 400 MG tablet Take 1 tablet (400 mg total) by mouth daily. 90 tablet 3  . montelukast (SINGULAIR) 10 MG tablet Take 1 tablet by mouth daily.  3  . Multiple Vitamin (MULTIVITAMIN WITH MINERALS) TABS Take 1 tablet by mouth daily.    . nitroGLYCERIN (NITROSTAT) 0.4 MG SL tablet Place 0.4 mg under the tongue every 5 (five) minutes as needed for chest pain. (up to 3 doses) For chest pain    . omeprazole (PRILOSEC) 40 MG capsule Take 1 capsule (40 mg total) by mouth every morning. 30 capsule 2  . ondansetron (ZOFRAN-ODT) 4 MG disintegrating tablet Take 4 mg by mouth  every 4 (four) hours as needed for nausea.     Marland Kitchen spironolactone (ALDACTONE) 25 MG tablet TAKE HALF TABLET DAILY 45 tablet 6  . traMADol (ULTRAM) 50 MG tablet Take 1 tablet by mouth every 6 hours as needed for pain. 30 tablet 0  . warfarin (COUMADIN) 5 MG tablet TAKE 1 TO 1 AND 1/2 TABLETS BY MOUTH DAILY AS DIRECTED BY COUMADIN CLINIC 120 tablet 0  . furosemide (LASIX) 40 MG tablet Take 1 tablet (40 mg total) by mouth daily. 90 tablet 3   No current facility-administered medications for this visit.     Allergies  Allergen Reactions  . Bentyl [Dicyclomine Hcl] Other (See Comments)    Blisters in mouth, lips swelling    . Sulfa Antibiotics Shortness Of Breath  . Ace Inhibitors     Unknown  . Corzide [Nadolol-Bendroflumethiazide]     Unknown  . Diovan [Valsartan]     Unknown  . Flagyl [Metronidazole] Other (See Comments)    Unknown  . Omeprazole     Shut my kidneys down  . Other     Perfumes smells; paint smells; formaldehyde smells  . Shellfish Allergy   . Tape Other (See Comments)  . Amlodipine Besylate Other (See Comments)    Numbness to lips; made legs give out  . Codeine Itching and Rash    Itching  . Latex Itching and Rash  . Norvasc [Amlodipine Besylate] Other (See Comments)    Numbness to lips; made legs give out  . Penicillins Rash    Childhood reaction    Past Medical History:  Diagnosis Date  . 6949-lead    replaced 02/2011  . Acute on chronic renal insufficiency   . AICD (automatic cardioverter/defibrillator) present    Medtronic CRT  . Asthma   . Atrial flutter ([place])    s/p TEE guided cardioversion  . CHF (congestive heart failure) ([place])   . Chronic drug-induced interstitial lung disorders ([place])   . COPD (chronic obstructive pulmonary disease) ([place])    " very MILD "  . Coronary artery disease    total occlusion of the right coronary artery, left to right collaterals.  He had Cypher stenting to the circumflex in  July 2003  . Family hx of colon cancer      2 SISTERS  . Fatty liver    CT  . Headache(784.0)   . Hyperlipemia   . Hypertension   . Hyponatremia   . Ischemic cardiomyopathy    Myoview 02/2011  EF 28%  Coronary artery disease  (total occlusion of the right coronary artery, left-to-right )  . LBBB (left bundle branch block)   .  Nonsustained ventricular tachycardia ([place])   . Seizure disorder [facility])     Social History   Socioeconomic History  . Marital status: Married    Spouse name: Not on file  . Number of children: 1  . Years of education: Not on file  . Highest education level: Not on file  Occupational History  . Occupation: RETIRED    Employer: RETIRED  Social Needs  . Financial resource strain: Not on file  . Food insecurity:    Worry: Not on file    Inability: Not on file  . Transportation needs:    Medical: Not on file    Non-medical: Not on file  Tobacco Use  . Smoking status: Never Smoker  . Smokeless tobacco: Never Used  Substance and Sexual Activity  . Alcohol use: Yes    Comment: OCCASIONALLY NOT OFTEN  . Drug use: No  . Sexual activity: Not on file  Lifestyle  . Physical activity:    Days per week: Not on file    Minutes per session: Not on file  . Stress: Not on file  Relationships  . Social connections:    Talks on phone: Not on file    Gets together: Not on file    Attends religious service: Not on file    Active member of club or organization: Not on file    Attends meetings of clubs or organizations: Not on file    Relationship status: Not on file  . Intimate partner violence:    Fear of current or ex partner: Not on file    Emotionally abused: Not on file    Physically abused: Not on file    Forced sexual activity: Not on file  Other Topics Concern  . Not on file  Social History Narrative  . Not on file     Family History  Problem Relation Age of Onset  . Colon cancer Sister        BOTH SISTERS 1 DESEASED FROM COLON CANCER  . Colon cancer Sister   . Heart disease Mother    . Heart disease Father      Review of Systems: General: negative for chills, fever, night sweats or weight changes.  Cardiovascular: negative for chest pain, orthopnea, palpitations, paroxysmal nocturnal dyspnea Dermatological: negative for rash Respiratory: negative for cough or wheezing Urologic: negative for hematuria Abdominal: negative for vomiting,  bright red blood per rectum, melena, or hematemesis Neurologic: negative for visual changes, syncope, or dizziness All other systems reviewed and are otherwise negative except as noted above.    Blood pressure 102/70, pulse 73, height 5' 8.25" (1.734 m), weight 174 lb (78.9 kg).  General appearance: alert, cooperative, appears stated age and no distress Neck: JVD at 30 degrees incline Lungs: decreased at bases, Rt > Lt Heart: regular rate and rhythm Abdomen: not distended, mild RUQ pain to palpation, no HSM Extremities: 1+ bilater LE edema Skin: pale, dry Neurologic: Grossly normal  EKG V paced  ASSESSMENT AND PLAN:   Acute on chronic systolic (congestive) heart failure (HCC) Pt has evidence of volume overload on exam and I suspect he has low output CHF. Will attempt OP diuresis, check echo, early f/u in 4-5 days.  CKD (chronic kidney disease), stage III GFR 44- last SCr 1.7  CAD S/P percutaneous coronary angioplasty CFX PCI in 2003, CTO of RCA with collaterals  PAF (paroxysmal atrial fibrillation) ([place]) Currently V paced  ICD BiV  medtronic MDT ICD '07, gen change 2012, MDT BiV ICD  2017 with lead change.  Ischemic cardiomyopathy Coronary artery disease status post Cypher drug-eluting stent to     left circumflex in 2003 with known right coronary artery total with     left-to-right collaterals and no other disease per cardiac     catheterization in 2003.  Myoview April 2012-no ischemia ejection fraction 28% inferolateral scar Echo 2017- EF 35-40%   PLAN  Mr Fok appears to have low output CHF. I suggested  we bump his Lasix - 60 mg extra today- 80 mg once in am, then increase daily dose to 40 mg.  I have arranged for an echo tomorrow.  I'll see him back Monday.  I warned him this may require admission for inotropic therapy. In reviewing his medications it seem unlikely that he has Dig toxicity since he is only on 0.0625 mg 3 times a week but I'll check a Dig level as well as a BMP and BNP Monday.  He is on two beta blockers I'll ask [name] about that.   Kerin Ransom [name] 11/18/2018 12:44 PM

## 2018-11-18 NOTE — Assessment & Plan Note (Signed)
Pt has evidence of volume overload on exam and I suspect he has low output CHF. Will attempt OP diuresis, check echo, early f/u in 4-5 days.

## 2018-11-18 NOTE — Assessment & Plan Note (Signed)
GFR 44- last SCr 1.7

## 2018-11-18 NOTE — Assessment & Plan Note (Signed)
MDT ICD '07, gen change 2012, MDT BiV ICD 2017 with lead change.

## 2018-11-19 ENCOUNTER — Ambulatory Visit (HOSPITAL_COMMUNITY): Payer: PPO | Attending: Cardiology

## 2018-11-19 DIAGNOSIS — I5023 Acute on chronic systolic (congestive) heart failure: Secondary | ICD-10-CM | POA: Diagnosis not present

## 2018-11-19 MED ORDER — PERFLUTREN LIPID MICROSPHERE
1.0000 mL | INTRAVENOUS | Status: AC | PRN
  Administered 2018-11-19: 2 mL via INTRAVENOUS

## 2018-11-22 ENCOUNTER — Other Ambulatory Visit: Payer: Self-pay

## 2018-11-22 ENCOUNTER — Encounter: Payer: Self-pay | Admitting: Cardiology

## 2018-11-22 ENCOUNTER — Ambulatory Visit (INDEPENDENT_AMBULATORY_CARE_PROVIDER_SITE_OTHER): Payer: PPO | Admitting: Cardiology

## 2018-11-22 ENCOUNTER — Encounter (HOSPITAL_COMMUNITY): Payer: Self-pay | Admitting: General Practice

## 2018-11-22 ENCOUNTER — Inpatient Hospital Stay (HOSPITAL_COMMUNITY)
Admission: AD | Admit: 2018-11-22 | Discharge: 2018-12-03 | DRG: 286 | Disposition: A | Discharge status: Home Health | Payer: PPO | Source: Ambulatory Visit | Attending: Internal Medicine | Admitting: Internal Medicine

## 2018-11-22 VITALS — BP 110/74 | HR 91 | Ht 68.0 in | Wt 171.2 lb

## 2018-11-22 DIAGNOSIS — I509 Heart failure, unspecified: Secondary | ICD-10-CM | POA: Diagnosis not present

## 2018-11-22 DIAGNOSIS — Z7189 Other specified counseling: Secondary | ICD-10-CM | POA: Diagnosis not present

## 2018-11-22 DIAGNOSIS — I4729 Other ventricular tachycardia: Secondary | ICD-10-CM

## 2018-11-22 DIAGNOSIS — Z66 Do not resuscitate: Secondary | ICD-10-CM | POA: Diagnosis not present

## 2018-11-22 DIAGNOSIS — K760 Fatty (change of) liver, not elsewhere classified: Secondary | ICD-10-CM | POA: Diagnosis present

## 2018-11-22 DIAGNOSIS — E876 Hypokalemia: Secondary | ICD-10-CM | POA: Diagnosis present

## 2018-11-22 DIAGNOSIS — I5023 Acute on chronic systolic (congestive) heart failure: Secondary | ICD-10-CM

## 2018-11-22 DIAGNOSIS — I272 Pulmonary hypertension, unspecified: Secondary | ICD-10-CM | POA: Diagnosis present

## 2018-11-22 DIAGNOSIS — I25119 Atherosclerotic heart disease of native coronary artery with unspecified angina pectoris: Secondary | ICD-10-CM | POA: Diagnosis not present

## 2018-11-22 DIAGNOSIS — Z881 Allergy status to other antibiotic agents status: Secondary | ICD-10-CM | POA: Diagnosis not present

## 2018-11-22 DIAGNOSIS — Z9109 Other allergy status, other than to drugs and biological substances: Secondary | ICD-10-CM

## 2018-11-22 DIAGNOSIS — Z452 Encounter for adjustment and management of vascular access device: Secondary | ICD-10-CM

## 2018-11-22 DIAGNOSIS — E785 Hyperlipidemia, unspecified: Secondary | ICD-10-CM | POA: Diagnosis present

## 2018-11-22 DIAGNOSIS — I493 Ventricular premature depolarization: Secondary | ICD-10-CM | POA: Diagnosis present

## 2018-11-22 DIAGNOSIS — Z95810 Presence of automatic (implantable) cardiac defibrillator: Secondary | ICD-10-CM | POA: Diagnosis not present

## 2018-11-22 DIAGNOSIS — Z79899 Other long term (current) drug therapy: Secondary | ICD-10-CM

## 2018-11-22 DIAGNOSIS — I2510 Atherosclerotic heart disease of native coronary artery without angina pectoris: Secondary | ICD-10-CM

## 2018-11-22 DIAGNOSIS — R0602 Shortness of breath: Secondary | ICD-10-CM | POA: Diagnosis present

## 2018-11-22 DIAGNOSIS — I472 Ventricular tachycardia: Secondary | ICD-10-CM | POA: Diagnosis present

## 2018-11-22 DIAGNOSIS — Z888 Allergy status to other drugs, medicaments and biological substances status: Secondary | ICD-10-CM

## 2018-11-22 DIAGNOSIS — Z880 Allergy status to penicillin: Secondary | ICD-10-CM

## 2018-11-22 DIAGNOSIS — Z9861 Coronary angioplasty status: Secondary | ICD-10-CM

## 2018-11-22 DIAGNOSIS — I428 Other cardiomyopathies: Secondary | ICD-10-CM | POA: Diagnosis present

## 2018-11-22 DIAGNOSIS — I10 Essential (primary) hypertension: Secondary | ICD-10-CM | POA: Diagnosis present

## 2018-11-22 DIAGNOSIS — N183 Chronic kidney disease, stage 3 unspecified: Secondary | ICD-10-CM

## 2018-11-22 DIAGNOSIS — I447 Left bundle-branch block, unspecified: Secondary | ICD-10-CM | POA: Diagnosis present

## 2018-11-22 DIAGNOSIS — Z515 Encounter for palliative care: Secondary | ICD-10-CM | POA: Diagnosis not present

## 2018-11-22 DIAGNOSIS — I130 Hypertensive heart and chronic kidney disease with heart failure and stage 1 through stage 4 chronic kidney disease, or unspecified chronic kidney disease: Secondary | ICD-10-CM | POA: Diagnosis present

## 2018-11-22 DIAGNOSIS — G40909 Epilepsy, unspecified, not intractable, without status epilepticus: Secondary | ICD-10-CM | POA: Diagnosis present

## 2018-11-22 DIAGNOSIS — I5022 Chronic systolic (congestive) heart failure: Secondary | ICD-10-CM | POA: Diagnosis not present

## 2018-11-22 DIAGNOSIS — I480 Paroxysmal atrial fibrillation: Secondary | ICD-10-CM

## 2018-11-22 DIAGNOSIS — I255 Ischemic cardiomyopathy: Secondary | ICD-10-CM

## 2018-11-22 DIAGNOSIS — N179 Acute kidney failure, unspecified: Secondary | ICD-10-CM | POA: Diagnosis present

## 2018-11-22 DIAGNOSIS — Z7901 Long term (current) use of anticoagulants: Secondary | ICD-10-CM

## 2018-11-22 DIAGNOSIS — I2582 Chronic total occlusion of coronary artery: Secondary | ICD-10-CM | POA: Diagnosis present

## 2018-11-22 DIAGNOSIS — I4821 Permanent atrial fibrillation: Secondary | ICD-10-CM | POA: Diagnosis present

## 2018-11-22 DIAGNOSIS — K219 Gastro-esophageal reflux disease without esophagitis: Secondary | ICD-10-CM | POA: Diagnosis present

## 2018-11-22 DIAGNOSIS — J449 Chronic obstructive pulmonary disease, unspecified: Secondary | ICD-10-CM | POA: Diagnosis present

## 2018-11-22 DIAGNOSIS — Z8249 Family history of ischemic heart disease and other diseases of the circulatory system: Secondary | ICD-10-CM

## 2018-11-22 DIAGNOSIS — Z885 Allergy status to narcotic agent status: Secondary | ICD-10-CM

## 2018-11-22 DIAGNOSIS — I482 Chronic atrial fibrillation, unspecified: Secondary | ICD-10-CM | POA: Diagnosis not present

## 2018-11-22 DIAGNOSIS — Z955 Presence of coronary angioplasty implant and graft: Secondary | ICD-10-CM

## 2018-11-22 DIAGNOSIS — Q447 Other congenital malformations of liver: Secondary | ICD-10-CM | POA: Diagnosis not present

## 2018-11-22 DIAGNOSIS — Z882 Allergy status to sulfonamides status: Secondary | ICD-10-CM

## 2018-11-22 DIAGNOSIS — Z91013 Allergy to seafood: Secondary | ICD-10-CM

## 2018-11-22 HISTORY — DX: Gastro-esophageal reflux disease without esophagitis: K21.9

## 2018-11-22 LAB — CBC WITH DIFFERENTIAL/PLATELET
Abs Immature Granulocytes: 0.01 10*3/uL (ref 0.00–0.07)
Basophils Absolute: 0 10*3/uL (ref 0.0–0.1)
Basophils Relative: 1 %
Eosinophils Absolute: 0 10*3/uL (ref 0.0–0.5)
Eosinophils Relative: 0 %
HCT: 34.3 % — ABNORMAL LOW (ref 39.0–52.0)
Hemoglobin: 11.1 g/dL — ABNORMAL LOW (ref 13.0–17.0)
Immature Granulocytes: 0 %
Lymphocytes Relative: 24 %
Lymphs Abs: 1.3 10*3/uL (ref 0.7–4.0)
MCH: 31.6 pg (ref 26.0–34.0)
MCHC: 32.4 g/dL (ref 30.0–36.0)
MCV: 97.7 fL (ref 80.0–100.0)
Monocytes Absolute: 0.6 10*3/uL (ref 0.1–1.0)
Monocytes Relative: 12 %
Neutro Abs: 3.4 10*3/uL (ref 1.7–7.7)
Neutrophils Relative %: 63 %
Platelets: 234 10*3/uL (ref 150–400)
RBC: 3.51 MIL/uL — ABNORMAL LOW (ref 4.22–5.81)
RDW: 14.6 % (ref 11.5–15.5)
WBC: 5.3 10*3/uL (ref 4.0–10.5)
nRBC: 0 % (ref 0.0–0.2)

## 2018-11-22 LAB — COMPREHENSIVE METABOLIC PANEL
ALT: 31 U/L (ref 0–44)
AST: 32 U/L (ref 15–41)
Albumin: 3.4 g/dL — ABNORMAL LOW (ref 3.5–5.0)
Alkaline Phosphatase: 80 U/L (ref 38–126)
Anion gap: 8 (ref 5–15)
BUN: 30 mg/dL — ABNORMAL HIGH (ref 8–23)
CO2: 25 mmol/L (ref 22–32)
Calcium: 8.8 mg/dL — ABNORMAL LOW (ref 8.9–10.3)
Chloride: 98 mmol/L (ref 98–111)
Creatinine, Ser: 1.87 mg/dL — ABNORMAL HIGH (ref 0.61–1.24)
GFR calc Af Amer: 40 mL/min — ABNORMAL LOW (ref >60)
GFR calc non Af Amer: 35 mL/min — ABNORMAL LOW (ref >60)
Glucose, Bld: 126 mg/dL — ABNORMAL HIGH (ref 70–99)
Potassium: 4.4 mmol/L (ref 3.5–5.1)
Sodium: 131 mmol/L — ABNORMAL LOW (ref 135–145)
Total Bilirubin: 1.6 mg/dL — ABNORMAL HIGH (ref 0.3–1.2)
Total Protein: 6.9 g/dL (ref 6.5–8.1)

## 2018-11-22 LAB — TSH: TSH: 3.329 u[IU]/mL (ref 0.350–4.500)

## 2018-11-22 LAB — PROTIME-INR
INR: 2.92
Prothrombin Time: 30 seconds — ABNORMAL HIGH (ref 11.4–15.2)

## 2018-11-22 LAB — MAGNESIUM: Magnesium: 2.1 mg/dL (ref 1.7–2.4)

## 2018-11-22 MED ORDER — HYDRALAZINE HCL 25 MG PO TABS: 25.0000 mg | ORAL_TABLET | Freq: Two times a day (BID) | ORAL | Status: DC

## 2018-11-22 MED ORDER — ADULT MULTIVITAMIN W/MINERALS CH
1.0000 | ORAL_TABLET | Freq: Every day | ORAL | Status: DC
  Administered 2018-11-23 – 2018-12-03 (×11): 1 via ORAL
  Filled 2018-11-22 (×11): qty 1

## 2018-11-22 MED ORDER — ACETAMINOPHEN 325 MG PO TABS
650.0000 mg | ORAL_TABLET | ORAL | Status: DC | PRN
  Administered 2018-11-25 – 2018-12-01 (×5): 650 mg via ORAL
  Filled 2018-11-22 (×7): qty 2

## 2018-11-22 MED ORDER — ALLOPURINOL 100 MG PO TABS: 100.0000 mg | ORAL_TABLET | Freq: Every day | ORAL | Status: DC

## 2018-11-22 MED ORDER — PANTOPRAZOLE SODIUM 20 MG PO TBEC: 40.0000 mg | DELAYED_RELEASE_TABLET | Freq: Every day | ORAL | Status: DC

## 2018-11-22 MED ORDER — SODIUM CHLORIDE 0.9 % IV SOLN
4.0000 mg | Freq: Four times a day (QID) | INTRAVENOUS | Status: DC | PRN
  Filled 2018-11-22: qty 2

## 2018-11-22 MED ORDER — OMEGA-3 FATTY ACIDS 1000 MG PO CAPS: 2.0000 g | ORAL_CAPSULE | Freq: Every day | ORAL | Status: DC

## 2018-11-22 MED ORDER — SPIRONOLACTONE 12.5 MG HALF TABLET
12.5000 mg | ORAL_TABLET | Freq: Every day | ORAL | Status: DC
  Administered 2018-11-23: 12.5 mg via ORAL
  Filled 2018-11-22 (×3): qty 1

## 2018-11-22 MED ORDER — TRAMADOL HCL 50 MG PO TABS
50.0000 mg | ORAL_TABLET | Freq: Four times a day (QID) | ORAL | Status: DC | PRN
  Administered 2018-11-22 – 2018-12-01 (×9): 50 mg via ORAL
  Filled 2018-11-22 (×11): qty 1

## 2018-11-22 MED ORDER — NITROGLYCERIN 0.4 MG SL SUBL: 0.4000 mg | SUBLINGUAL_TABLET | SUBLINGUAL | Status: DC | PRN

## 2018-11-22 MED ORDER — HYDRALAZINE HCL 25 MG PO TABS
25.0000 mg | ORAL_TABLET | Freq: Two times a day (BID) | ORAL | Status: DC
  Administered 2018-11-22 – 2018-12-03 (×22): 25 mg via ORAL
  Filled 2018-11-22 (×23): qty 1

## 2018-11-22 MED ORDER — WARFARIN - PHARMACIST DOSING INPATIENT: Freq: Every day | Status: DC

## 2018-11-22 MED ORDER — PANTOPRAZOLE SODIUM 40 MG PO TBEC
40.0000 mg | DELAYED_RELEASE_TABLET | Freq: Every day | ORAL | Status: DC
  Administered 2018-11-24 – 2018-12-03 (×10): 40 mg via ORAL
  Filled 2018-11-22 (×11): qty 1

## 2018-11-22 MED ORDER — LOPERAMIDE HCL 2 MG PO CAPS
2.0000 mg | ORAL_CAPSULE | ORAL | Status: DC | PRN
  Administered 2018-11-22: 2 mg via ORAL
  Filled 2018-11-22 (×2): qty 1

## 2018-11-22 MED ORDER — ALLOPURINOL 100 MG PO TABS
100.0000 mg | ORAL_TABLET | Freq: Every day | ORAL | Status: DC
  Administered 2018-11-22 – 2018-12-03 (×12): 100 mg via ORAL
  Filled 2018-11-22 (×12): qty 1

## 2018-11-22 MED ORDER — MONTELUKAST SODIUM 10 MG PO TABS: 10.0000 mg | ORAL_TABLET | Freq: Every day | ORAL | Status: DC

## 2018-11-22 MED ORDER — FUROSEMIDE 10 MG/ML IJ SOLN
40.0000 mg | Freq: Two times a day (BID) | INTRAMUSCULAR | Status: DC
  Administered 2018-11-22 – 2018-11-23 (×2): 40 mg via INTRAVENOUS
  Filled 2018-11-22 (×2): qty 4

## 2018-11-22 MED ORDER — VITAMIN C 500 MG PO TABS: 500.0000 mg | ORAL_TABLET | Freq: Every day | ORAL | Status: DC

## 2018-11-22 MED ORDER — CARVEDILOL PHOSPHATE ER 20 MG PO CP24
20.0000 mg | ORAL_CAPSULE | Freq: Every day | ORAL | Status: DC
  Filled 2018-11-22: qty 1

## 2018-11-22 MED ORDER — CARVEDILOL PHOSPHATE ER 20 MG PO CP24
20.0000 mg | ORAL_CAPSULE | Freq: Every day | ORAL | Status: DC
  Administered 2018-11-23: 20 mg via ORAL
  Filled 2018-11-22 (×2): qty 1

## 2018-11-22 MED ORDER — DIGOXIN 125 MCG PO TABS: 0.0625 mg | ORAL_TABLET | Freq: Every day | ORAL | Status: DC

## 2018-11-22 MED ORDER — VITAMIN C 500 MG PO TABS
500.0000 mg | ORAL_TABLET | Freq: Every day | ORAL | Status: DC
  Administered 2018-11-23 – 2018-12-03 (×11): 500 mg via ORAL
  Filled 2018-11-22 (×11): qty 1

## 2018-11-22 MED ORDER — DIPHENHYDRAMINE HCL 25 MG PO CAPS: 25.0000 mg | ORAL_CAPSULE | Freq: Four times a day (QID) | ORAL | Status: DC | PRN

## 2018-11-22 MED ORDER — HYOSCYAMINE SULFATE 0.125 MG SL SUBL: 0.1250 mg | SUBLINGUAL_TABLET | SUBLINGUAL | 0 refills | Status: DC | PRN

## 2018-11-22 MED ORDER — SPIRONOLACTONE 25 MG PO TABS
12.5000 mg | ORAL_TABLET | Freq: Every day | ORAL | Status: DC
  Filled 2018-11-22: qty 0.5

## 2018-11-22 MED ORDER — MONTELUKAST SODIUM 10 MG PO TABS
10.0000 mg | ORAL_TABLET | Freq: Every day | ORAL | Status: DC
  Administered 2018-11-22 – 2018-12-02 (×11): 10 mg via ORAL
  Filled 2018-11-22 (×11): qty 1

## 2018-11-22 MED ORDER — FUROSEMIDE 10 MG/ML IJ SOLN: 40.0000 mg | Freq: Two times a day (BID) | INTRAMUSCULAR | Status: DC

## 2018-11-22 MED ORDER — ADULT MULTIVITAMIN W/MINERALS CH: 1.0000 | ORAL_TABLET | Freq: Every day | ORAL | Status: DC

## 2018-11-22 MED ORDER — DIGOXIN 125 MCG PO TABS
0.0625 mg | ORAL_TABLET | Freq: Every day | ORAL | Status: DC
  Administered 2018-11-22 – 2018-12-01 (×10): 0.0625 mg via ORAL
  Filled 2018-11-22 (×10): qty 1

## 2018-11-22 NOTE — Progress Notes (Signed)
VAST RN to unit to start PIV. Pt's nurse informed this RN that AD was in room attempting IV. Upon entering room, VAST RN informed that PIV started successfully.

## 2018-11-22 NOTE — Progress Notes (Signed)
11/22/2018 [name]   1946-03-29  [id number]  Primary Physician [name] Roger, MD Primary Cardiologist: Dr [name]- Dr [name] EP- Dr [name] CHF  HPI:  The patient is a pleasant 73 year old male followed by Dr. Percival [name] and Dr. Caryl [name].  He has a long history of cardiomyopathy.  He saw Dr [name] in the past.  He had a CFX PCI in 2003.  Catheterization revealed a chronic total occlusion of the RCA with left-to-right collaterals.  He had an ICD placed in 2007 with a generator change in 2012 and a Bi V ICD implanted as well as lead replacement in 2017.  His ejection fraction by echo in January 2017 showed his EF improved to 35 to 40%.  Other medical problems include a history of PAF, hypertension, and chronic renal insufficiency.  He is on chronic Coumadin anticoagulation.  Recently he is developed a constellation of symptoms including nausea abdominal pain lower extremity edema and shortness of breath.  He had some studies done at [facility].  This showed a stable blood count with a hemoglobin of 12, a white count of 7.2, BUN 25, creatinine 1.55 and normal liver functions amylase and lipase.  Abdominal ultrasound showed some gallbladder thickening and a HIDA scan was obtained.  He has a cystic lesion adjacent to the head of the pancreas which appeared unchanged.  There was a small right pleural effusion.  He tells me his HIDA scan was negative although I do not have that actual report.  He was seen by [name] at [facility].  She ordered labs including a BNP as she was concerned his symptoms may be from low cardiac output and causing abdominal ischemia and congestive heart failure.  Labs done on December 30 revealed sodium 129, potassium 5.2, BUN 27, creatinine 1.7, BNP of 1353 and hemoglobin of 12 with a normal white count.  His INR has been stable, his last INR was 2.4 on December 11.  The patient was seen in the office 11/18/18 for further evaluation.  His wife accompanied  him.  He admitted to not eating well.  He has not been able to do his usual exercise -walking. He denies orthopnea.  He does have dyspnea on exertion.  He does have lower extremity edema which he has not had in the past.  I ncreased his diuretic and ordered an echo.  I reviewed his case with Dr [name] and the plan was to admit for low out put CHF and ask CHF to see if he wasn't significantly improved.   He is in the office today for follow up.  Unfortunately his echo shows his EF is now 15-20%.  His weight is down a couple of pounds and he feels a little better but he still has DOE and abdominal pain.  His wife almost brought him to the ED Saturday night.  After discussion with the pat and MD it was felt the best course was to admit for CHF evaluation and further Rx.     Current Outpatient Medications  Medication Sig Dispense Refill  . allopurinol (ZYLOPRIM) 100 MG tablet Take 100 mg by mouth daily.    . Ascorbic Acid (VITAMIN C) POWD Take 1 Dose by mouth daily.     . ASTRAGALUS PO Take 1 Dose by mouth daily.     . bisoprolol (ZEBETA) 5 MG tablet TAKE 1/2 TABLET BY MOUTH DAILY 90 tablet 3  . Calcium Carbonate Antacid (MAALOX PO) Take 1 Dose by mouth as  needed.    . Calcium-Magnesium 500-250 MG TABS Take 1 tablet by mouth daily.     . carvedilol (COREG CR) 20 MG 24 hr capsule TAKE 1 CAPSULE BY MOUTH EVERY DAY 90 capsule 3  . dextromethorphan-guaiFENesin (MUCINEX DM) 30-600 MG 12hr tablet Take 1 tablet by mouth 2 (two) times daily as needed for cough.    . digoxin (LANOXIN) 0.125 MG tablet TAKE 1 TABLET BY MOUTH EVERY OTHER DAY (KEEP OFFICE VISIT) 90 tablet 3  . diphenhydrAMINE (BENADRYL) 25 mg capsule Take 25 mg by mouth every 6 (six) hours as needed for allergies.     . fish oil-omega-3 fatty acids 1000 MG capsule Take 2 g by mouth daily.    . furosemide (LASIX) 40 MG tablet Take 1 tablet (40 mg total) by mouth daily. 90 tablet 3  . hydrALAZINE (APRESOLINE) 25 MG tablet Take 1 tablet (25 mg  total) by mouth 2 (two) times daily. 180 tablet 1  . loperamide (IMODIUM A-D) 2 MG tablet Take 2 mg by mouth as needed for diarrhea or loose stools.    . magnesium oxide (MAG-OX) 400 MG tablet Take 1 tablet (400 mg total) by mouth daily. 90 tablet 3  . montelukast (SINGULAIR) 10 MG tablet Take 1 tablet by mouth daily.  3  . Multiple Vitamin (MULTIVITAMIN WITH MINERALS) TABS Take 1 tablet by mouth daily.    . nitroGLYCERIN (NITROSTAT) 0.4 MG SL tablet Place 0.4 mg under the tongue every 5 (five) minutes as needed for chest pain. (up to 3 doses) For chest pain    . omeprazole (PRILOSEC) 40 MG capsule Take 1 capsule (40 mg total) by mouth every morning. 30 capsule 2  . ondansetron (ZOFRAN-ODT) 4 MG disintegrating tablet Take 4 mg by mouth every 4 (four) hours as needed for nausea.     Marland Kitchen spironolactone (ALDACTONE) 25 MG tablet TAKE HALF TABLET DAILY 45 tablet 6  . traMADol (ULTRAM) 50 MG tablet Take 1 tablet by mouth every 6 hours as needed for pain. 30 tablet 0  . warfarin (COUMADIN) 5 MG tablet TAKE 1 TO 1 AND 1/2 TABLETS BY MOUTH DAILY AS DIRECTED BY COUMADIN CLINIC 120 tablet 0   No current facility-administered medications for this visit.     Allergies  Allergen Reactions  . Bentyl [Dicyclomine Hcl] Other (See Comments)    Blisters in mouth, lips swelling    . Sulfa Antibiotics Shortness Of Breath  . Ace Inhibitors     Unknown  . Corzide [Nadolol-Bendroflumethiazide]     Unknown  . Diovan [Valsartan]     Unknown  . Flagyl [Metronidazole] Other (See Comments)    Unknown  . Omeprazole     Shut my kidneys down  . Other     Perfumes smells; paint smells; formaldehyde smells  . Shellfish Allergy   . Tape Other (See Comments)  . Amlodipine Besylate Other (See Comments)    Numbness to lips; made legs give out  . Codeine Itching and Rash    Itching  . Latex Itching and Rash  . Norvasc [Amlodipine Besylate] Other (See Comments)    Numbness to lips; made legs give out  . Penicillins  Rash    Childhood reaction    Past Medical History:  Diagnosis Date  . 6949-lead    replaced 02/2011  . Acute on chronic renal insufficiency   . AICD (automatic cardioverter/defibrillator) present    Medtronic CRT  . Asthma   . Atrial flutter ([place])    s/p  TEE guided cardioversion  . CHF (congestive heart failure) ([place])   . Chronic drug-induced interstitial lung disorders ([place])   . COPD (chronic obstructive pulmonary disease) ([place])    " very MILD "  . Coronary artery disease    total occlusion of the right coronary artery, left to right collaterals.  He had Cypher stenting to the circumflex in  July 2003  . Family hx of colon cancer    2 SISTERS  . Fatty liver    CT  . Headache(784.0)   . Hyperlipemia   . Hypertension   . Hyponatremia   . Ischemic cardiomyopathy    Myoview 02/2011  EF 28%  Coronary artery disease  (total occlusion of the right coronary artery, left-to-right )  . LBBB (left bundle branch block)   . Nonsustained ventricular tachycardia ([place])   . Seizure disorder [facility])     Social History   Socioeconomic History  . Marital status: Married    Spouse name: Not on file  . Number of children: 1  . Years of education: Not on file  . Highest education level: Not on file  Occupational History  . Occupation: RETIRED    Employer: RETIRED  Social Needs  . Financial resource strain: Not on file  . Food insecurity:    Worry: Not on file    Inability: Not on file  . Transportation needs:    Medical: Not on file    Non-medical: Not on file  Tobacco Use  . Smoking status: Never Smoker  . Smokeless tobacco: Never Used  Substance and Sexual Activity  . Alcohol use: Yes    Comment: OCCASIONALLY NOT OFTEN  . Drug use: No  . Sexual activity: Not on file  Lifestyle  . Physical activity:    Days per week: Not on file    Minutes per session: Not on file  . Stress: Not on file  Relationships  . Social connections:    Talks on phone: Not on file    Gets  together: Not on file    Attends religious service: Not on file    Active member of club or organization: Not on file    Attends meetings of clubs or organizations: Not on file    Relationship status: Not on file  . Intimate partner violence:    Fear of current or ex partner: Not on file    Emotionally abused: Not on file    Physically abused: Not on file    Forced sexual activity: Not on file  Other Topics Concern  . Not on file  Social History Narrative  . Not on file     Family History  Problem Relation Age of Onset  . Colon cancer Sister        BOTH SISTERS 1 DESEASED FROM COLON CANCER  . Colon cancer Sister   . Heart disease Mother   . Heart disease Father      Review of Systems: General: negative for chills, fever, night sweats or weight changes.  Cardiovascular: negative for chest pain, palpitations, paroxysmal nocturnal dyspnea Dermatological: negative for rash Respiratory: negative for cough or wheezing Urologic: negative for hematuria Abdominal: negative for nausea, vomiting, bright red blood per rectum, melena, or hematemesis Neurologic: negative for visual changes, syncope, or dizziness All other systems reviewed and are otherwise negative except as noted above.    Blood pressure 110/74, pulse 91, height 5\' 8"  (1.727 m), weight 171 lb 3.2 oz (77.7 kg).  General appearance: alert, cooperative, appears  stated age and no distress Neck: no carotid bruit and no JVD Lungs: faint basilar rales Heart: regular rate and rhythm, S3 present and soift systolic murmur LSB Abdomen: not distended, BS + Extremities: 1+ LE edema Skin: pale, cool, dry Neurologic: Grossly normal  EKG 11/18/18- paced  ASSESSMENT AND PLAN:   Acute on chronic systolic (congestive) heart failure (HCC) Pt has evidence of volume overload on exam and I suspect he has low output CHF. F/U echo shows drop in his EF to 15-20%, minimal improvement on increased diuretics  CKD (chronic kidney  disease), stage III GFR 44- last SCr 1.7  CAD S/P percutaneous coronary angioplasty CFX PCI in 2003, CTO of RCA with collaterals. Myoview April 2012-no ischemia ejection fraction 28% inferolateral scar  PAF (paroxysmal atrial fibrillation) ([place]) Currently V paced  ICD BiV  medtronic MDT ICD '07, gen change 2012, MDT BiV ICD 2017 with lead change.   PLAN  Discussed with MD- admit for CHF evaluation and Rx  Kerin Ransom [name] 11/22/2018 12:00 PM

## 2018-11-22 NOTE — Progress Notes (Signed)
PHARMACIST - PHYSICIAN ORDER COMMUNICATION  CONCERNING: P&T Medication Policy on Herbal Medications  DESCRIPTION:  This patient's order for:  Omega-3 Fatty Acids  has been noted.  This product(s) is classified as an "herbal" or natural product. Due to a lack of definitive safety studies or FDA approval, nonstandard manufacturing practices, plus the potential risk of unknown drug-drug interactions while on inpatient medications, the Pharmacy and Therapeutics Committee does not permit the use of "herbal" or natural products of this type within [facility].   ACTION TAKEN: The pharmacy department is unable to verify this order at this time and your patient has been informed of this safety policy. Please reevaluate patient's clinical condition at discharge and address if the herbal or natural product(s) should be resumed at that time.

## 2018-11-22 NOTE — Progress Notes (Signed)
ANTICOAGULATION CONSULT NOTE - Initial Consult  Pharmacy Consult for warfarin Indication: atrial fibrillation  Allergies  Allergen Reactions  . Bentyl [Dicyclomine Hcl] Other (See Comments)    Blisters in mouth, lips swelling    . Sulfa Antibiotics Shortness Of Breath  . Ace Inhibitors     Unknown  . Corzide [Nadolol-Bendroflumethiazide]     Unknown  . Diovan [Valsartan]     Unknown  . Flagyl [Metronidazole] Other (See Comments)    Unknown  . Omeprazole     Shut my kidneys down  . Other     Perfumes smells; paint smells; formaldehyde smells  . Shellfish Allergy   . Tape Other (See Comments)  . Amlodipine Besylate Other (See Comments)    Numbness to lips; made legs give out  . Codeine Itching and Rash    Itching  . Latex Itching and Rash  . Norvasc [Amlodipine Besylate] Other (See Comments)    Numbness to lips; made legs give out  . Penicillins Rash    Childhood reaction    Patient Measurements: Heparin Dosing Weight: 77.7 kg  Vital Signs: Temp: 97.8 F (36.6 C) (01/06 1545) Temp Source: Oral (01/06 1545) BP: 109/79 (01/06 1545) Pulse Rate: 74 (01/06 1545)  Labs: Recent Labs    11/22/18 1700 11/22/18 1702  HGB  --  11.1*  HCT  --  34.3*  PLT  --  234  LABPROT  --  30.0*  INR  --  2.92  CREATININE 1.87*  --     Estimated Creatinine Clearance: 33.5 mL/min (A) (by C-G formula based on SCr of 1.87 mg/dL (H)).   Medical History: Past Medical History:  Diagnosis Date  . 6949-lead    replaced 02/2011  . Acute on chronic renal insufficiency   . AICD (automatic cardioverter/defibrillator) present    Medtronic CRT  . Asthma   . Atrial flutter ([place])    s/p TEE guided cardioversion  . CHF (congestive heart failure) ([place])   . Chronic drug-induced interstitial lung disorders ([place])   . COPD (chronic obstructive pulmonary disease) ([place])    " very MILD "  . Coronary artery disease    total occlusion of the right coronary artery, left to right collaterals.   He had Cypher stenting to the circumflex in  July 2003  . Family hx of colon cancer    2 SISTERS  . Fatty liver    CT  . GERD (gastroesophageal reflux disease)   . Headache(784.0)   . Hyperlipemia   . Hypertension   . Hyponatremia   . Ischemic cardiomyopathy    Myoview 02/2011  EF 28%  Coronary artery disease  (total occlusion of the right coronary artery, left-to-right )  . LBBB (left bundle branch block)   . Nonsustained ventricular tachycardia ([place])   . Seizure disorder (HCC)     Medications:  Scheduled:  . allopurinol  100 mg Oral Daily  . carvedilol  20 mg Oral Daily  . digoxin  0.0625 mg Oral Daily  . furosemide  40 mg Intravenous BID  . hydrALAZINE  25 mg Oral BID  . montelukast  10 mg Oral QHS  . multivitamin with minerals  1 tablet Oral Daily  . pantoprazole  40 mg Oral Daily  . spironolactone  12.5 mg Oral Daily  . vitamin C  500 mg Oral Daily    Assessment: 67 yom with ICM with low output symptoms (abdominal pain, dyspnea on exertion with ECHO showing 15-20%) - on warfarin PTA for hx  Afib.   INR is 2.92 on admission today. Last dose was 1/6 at 0900 (prior to admission). Hgb 11.1, plt 234. No s/sx of bleeding.  PTA regimen 5 mg daily except 7.5 mg on Saturday.   Goal of Therapy:  INR 2-3 Monitor platelets by anticoagulation protocol: Yes   Plan:  No warfarin tonight since therapeutic INR and took PTA Monitor daily INR and CBC  [name], PharmD, [place] Pharmacist  Pager: [phone] Phone: [phone] 11/22/2018,6:39 PM

## 2018-11-22 NOTE — Telephone Encounter (Signed)
Sorry he had a rxn to Bentyl - we can try  Levsin 0.125 mg , one every 6 hours as needed for cramping / diarrhea - this is also an antispasmotic - but different type than bentyl   Send rx for 30 day supply , and notify pt

## 2018-11-22 NOTE — Patient Instructions (Signed)
PATIENT IS BEING ADMITTED, A CALL FOR A BED HAS BEEN PLACED AND PATIENT WILL GET A CALL WHEN A BED IS AVAILABLE. PATIENT VOICED UNDERSTANDING.   Medication Instructions:  Your physician recommends that you continue on your current medications as directed. Please refer to the Current Medication list given to you today. If you need a refill on your cardiac medications before your next appointment, please call your pharmacy.   Lab work: NONE  If you have labs (blood work) drawn today and your tests are completely normal, you will receive your results only by: Marland Kitchen MyChart Message (if you have MyChart) OR . A paper copy in the mail If you have any lab test that is abnormal or we need to change your treatment, we will call you to review the results.  Testing/Procedures: NONE   Follow-Up: At [facility], you and your health needs are our priority.  As part of our continuing mission to provide you with exceptional heart care, we have created designated Provider Care Teams.  These Care Teams include your primary Cardiologist (physician) and Advanced Practice Providers (APPs -  Physician Assistants and Nurse Practitioners) who all work together to provide you with the care you need, when you need it. Marland Kitchen PENDING HOSPITAL DISCHARGE  Any Other Special Instructions Will Be Listed Below (If Applicable).

## 2018-11-22 NOTE — H&P (Addendum)
Cardiology Admission History and Physical:   Patient ID: [name] MRN: [id number]; DOB: 01/31/1946   Admission date: (Not on file)  Primary Care Provider: [name], MD Primary Cardiologist: [name] Primary Electrophysiologist:  Dr Caryl Comes  Chief Complaint:  Abdominal pain, SOB  Patient Profile:   [name] is a 73 y.o. male with ICM who is admitted now with low output symptoms.  History of Present Illness:   The patient is a pleasant 73 year old male followed by Dr. Percival Spanish and Dr. Caryl Comes. He has a long history of cardiomyopathy. He saw Dr Haroldine Laws in the past.  He had a CFX PCI in 2003. Catheterization revealed a chronic total occlusion of the RCA with left-to-right collaterals. He had an ICD placed in 2007 with a generator change in 2012 and a Bi V ICD implanted as well as lead replacement in 2017. His ejection fraction by echo in January 2017 showed his EF improved to 35 to 40%. Other medical problems include a history of PAF, hypertension, and chronic renal insufficiency. He is on chronic Coumadin anticoagulation.  Recently he is developed a constellation of symptoms including nausea abdominal pain lower extremity edema and shortness of breath. He had some studies done at [facility]. This showed a stable blood count with a hemoglobin of 12, a white count of 7.2, BUN 25, creatinine 1.55 and normal liver functions amylase and lipase. Abdominal ultrasound showed some gallbladder thickening and a HIDA scan was obtained. He has a cystic lesion adjacent to the head of the pancreas which appeared unchanged. There was a small right pleural effusion. He tells me his HIDA scan was negative although I do not have that actual report. He was seen by Nicoletta Ba at [facility]. She ordered labs including a BNP as she was concerned his symptoms may be from low cardiac output and causing abdominal ischemia and congestive heart failure. Labs done on December 30  revealed sodium 129, potassium 5.2, BUN 27, creatinine 1.7, BNP of 1353 and hemoglobin of 12 with a normal white count. His INR has been stable, his last INR was 2.4 on December 11.  The patient was seen in the office 11/18/18 for further evaluation. His wife accompanied him. He admitted to not eating well. He has not been able to do his usual exercise -walking. He denies orthopnea. He does have dyspnea on exertion. He does have lower extremity edema which he has not had in the past.  I ncreased his diuretic and ordered an echo.  I reviewed his case with [name] and the plan was to admit for low out put CHF and ask CHF to see if he wasn't significantly improved.   He is in the office today for follow up.  Unfortunately his echo shows his EF is now 15-20%.  His weight is down a couple of pounds and he feels a little better but he still has DOE and abdominal pain.  His wife almost brought him to the ED Saturday night.  After discussion with the pat and MD it was felt the best course was to admit for CHF evaluation and further Rx.     Past Medical History:  Diagnosis Date  . 6949-lead    replaced 02/2011  . Acute on chronic renal insufficiency   . AICD (automatic cardioverter/defibrillator) present    Medtronic CRT  . Asthma   . Atrial flutter ([place])    s/p TEE guided cardioversion  . CHF (congestive heart failure) ([place])   .  Chronic drug-induced interstitial lung disorders ([place])   . COPD (chronic obstructive pulmonary disease) ([place])    " very MILD "  . Coronary artery disease    total occlusion of the right coronary artery, left to right collaterals.  He had Cypher stenting to the circumflex in  July 2003  . Family hx of colon cancer    2 SISTERS  . Fatty liver    CT  . Headache(784.0)   . Hyperlipemia   . Hypertension   . Hyponatremia   . Ischemic cardiomyopathy    Myoview 02/2011  EF 28%  Coronary artery disease  (total occlusion of the right coronary artery, left-to-right )    . LBBB (left bundle branch block)   . Nonsustained ventricular tachycardia ([place])   . Seizure disorder [facility])     Past Surgical History:  Procedure Laterality Date  . CORONARY ANGIOPLASTY    . EP IMPLANTABLE DEVICE N/A 03/05/2016   Procedure: ICD  Generator Changeout;  Surgeon: Deboraha Sprang, MD;  Location: [place] CV LAB;  Service: Cardiovascular;  Laterality: N/A;  . ESOPHAGOGASTRODUODENOSCOPY  09/13/2012   Procedure: ESOPHAGOGASTRODUODENOSCOPY (EGD);  Surgeon: Ladene Artist, MD,FACG;  Location: [facility];  Service: Endoscopy;  Laterality: N/A;  . HERNIA REPAIR    . INSERT / REPLACE / [place]  . RIGHT HEART CATHETERIZATION N/A 09/17/2012   Procedure: RIGHT HEART CATH;  Surgeon: Jolaine Artist, MD;  Location: [facility] CATH LAB;  Service: Cardiovascular;  Laterality: N/A;     Medications Prior to Admission: Prior to Admission medications   Medication Sig Start Date End Date Taking? Authorizing Provider  allopurinol (ZYLOPRIM) 100 MG tablet Take 100 mg by mouth daily.    [provider]  Ascorbic Acid (VITAMIN C) POWD Take 1 Dose by mouth daily.     [provider]  ASTRAGALUS PO Take 1 Dose by mouth daily.     [provider]  bisoprolol (ZEBETA) 5 MG tablet TAKE 1/2 TABLET BY MOUTH DAILY 02/01/18   Minus Breeding, MD  Calcium Carbonate Antacid (MAALOX PO) Take 1 Dose by mouth as needed.    [provider]  Calcium-Magnesium 500-250 MG TABS Take 1 tablet by mouth daily.     [provider]  carvedilol (COREG CR) 20 MG 24 hr capsule TAKE 1 CAPSULE BY MOUTH EVERY DAY 09/24/18   Minus Breeding, MD  dextromethorphan-guaiFENesin [facility] DM) 30-600 MG 12hr tablet Take 1 tablet by mouth 2 (two) times daily as needed for cough.    [provider]  digoxin (LANOXIN) 0.125 MG tablet TAKE 1 TABLET BY MOUTH EVERY OTHER DAY (KEEP OFFICE VISIT) 07/23/18   Minus Breeding, MD  diphenhydrAMINE (BENADRYL) 25 mg  capsule Take 25 mg by mouth every 6 (six) hours as needed for allergies.     [provider]  fish oil-omega-3 fatty acids 1000 MG capsule Take 2 g by mouth daily.    [provider]  furosemide (LASIX) 40 MG tablet Take 1 tablet (40 mg total) by mouth daily. 11/18/18 02/16/19  Erlene Quan, [name]  hydrALAZINE (APRESOLINE) 25 MG tablet Take 1 tablet (25 mg total) by mouth 2 (two) times daily. 07/28/18   Minus Breeding, MD  loperamide (IMODIUM A-D) 2 MG tablet Take 2 mg by mouth as needed for diarrhea or loose stools.    [provider]  magnesium oxide (MAG-OX) 400 MG tablet Take 1 tablet (400 mg total) by mouth daily. 03/19/15  Deboraha Sprang, MD  montelukast (SINGULAIR) 10 MG tablet Take 1 tablet by mouth daily. 11/18/17   [provider]  Multiple Vitamin (MULTIVITAMIN WITH MINERALS) TABS Take 1 tablet by mouth daily.    [provider]  nitroGLYCERIN (NITROSTAT) 0.4 MG SL tablet Place 0.4 mg under the tongue every 5 (five) minutes as needed for chest pain. (up to 3 doses) For chest pain 06/03/12   Minus Breeding, MD  omeprazole (PRILOSEC) 40 MG capsule Take 1 capsule (40 mg total) by mouth every morning. 11/15/18   [name], [name], [name]  ondansetron (ZOFRAN-ODT) 4 MG disintegrating tablet Take 4 mg by mouth every 4 (four) hours as needed for nausea.  09/08/12   Drenda Freeze, MD  spironolactone (ALDACTONE) 25 MG tablet TAKE HALF TABLET DAILY 06/08/18   Minus Breeding, MD  traMADol (ULTRAM) 50 MG tablet Take 1 tablet by mouth every 6 hours as needed for pain. 11/15/18   [name], [name], [name]  warfarin (COUMADIN) 5 MG tablet TAKE 1 TO 1 AND 1/2 TABLETS BY MOUTH DAILY AS DIRECTED BY COUMADIN CLINIC 09/07/18   Minus Breeding, MD     Allergies:    Allergies  Allergen Reactions  . Bentyl [Dicyclomine Hcl] Other (See Comments)    Blisters in mouth, lips swelling    . Sulfa Antibiotics Shortness Of Breath  . Ace Inhibitors     Unknown  . Corzide  [Nadolol-Bendroflumethiazide]     Unknown  . Diovan [Valsartan]     Unknown  . Flagyl [Metronidazole] Other (See Comments)    Unknown  . Omeprazole     Shut my kidneys down  . Other     Perfumes smells; paint smells; formaldehyde smells  . Shellfish Allergy   . Tape Other (See Comments)  . Amlodipine Besylate Other (See Comments)    Numbness to lips; made legs give out  . Codeine Itching and Rash    Itching  . Latex Itching and Rash  . Norvasc [Amlodipine Besylate] Other (See Comments)    Numbness to lips; made legs give out  . Penicillins Rash    Childhood reaction    Social History:   Social History   Socioeconomic History  . Marital status: Married    Spouse name: Not on file  . Number of children: 1  . Years of education: Not on file  . Highest education level: Not on file  Occupational History  . Occupation: RETIRED    Employer: RETIRED  Social Needs  . Financial resource strain: Not on file  . Food insecurity:    Worry: Not on file    Inability: Not on file  . Transportation needs:    Medical: Not on file    Non-medical: Not on file  Tobacco Use  . Smoking status: Never Smoker  . Smokeless tobacco: Never Used  Substance and Sexual Activity  . Alcohol use: Yes    Comment: OCCASIONALLY NOT OFTEN  . Drug use: No  . Sexual activity: Not on file  Lifestyle  . Physical activity:    Days per week: Not on file    Minutes per session: Not on file  . Stress: Not on file  Relationships  . Social connections:    Talks on phone: Not on file    Gets together: Not on file    Attends religious service: Not on file    Active member of club or organization: Not on file    Attends meetings of clubs or organizations:  Not on file    Relationship status: Not on file  . Intimate partner violence:    Fear of current or ex partner: Not on file    Emotionally abused: Not on file    Physically abused: Not on file    Forced sexual activity: Not on file  Other Topics  Concern  . Not on file  Social History Narrative  . Not on file    Family History:   The patient's family history includes Colon cancer in his sister and sister; Heart disease in his father and mother.    ROS:  Please see the history of present illness.  All other ROS reviewed and negative.     Physical Exam/Data:   Blood pressure 110/74, pulse 91, height 5\' 8"  (1.727 m), weight 171 lb 3.2 oz (77.7 kg).   General appearance: alert, cooperative, appears stated age and no distress HEENT: normocephalic, poor dentition Neck: no carotid bruit and no JVD Lungs: faint basilar rales Heart: regular rate and rhythm, S3 present and soift systolic murmur LSB Abdomen: not distended, BS + Extremities: 1+ LE edema Skin: pale, cool, dry Neurologic: Grossly normal   EKG:  The ECG that was done 11/18/18- V paced  Relevant CV Studies:  Echo 11/19/18 Study Conclusions  - Left ventricle: The cavity size was severely dilated. Wall   thickness was normal. Systolic function was severely reduced. The   estimated ejection fraction was 15%. Diffuse hypokinesis. There   is akinesis of the inferolateral myocardium. The study is not   technically sufficient to allow evaluation of LV diastolic   function. - Aortic valve: There was mild regurgitation. - Mitral valve: There was mild regurgitation. - Left atrium: The atrium was severely dilated. - Right ventricle: The cavity size was mildly dilated. Systolic   function was moderately reduced. - Right atrium: The atrium was moderately dilated. - Pulmonary arteries: Systolic pressure was mildly increased. - Pericardium, extracardiac: A trivial pericardial effusion was   identified.  Impressions:  - Definity used; severe LV dysfunction; 4 chamber enlargement; mild   AI, MR and TR; moderate RV dysfunction; mild pulmonary   hypertension.   Laboratory Data:  ChemistryNo results for input(s): NA, K, CL, CO2, GLUCOSE, BUN, CREATININE, CALCIUM,  GFRNONAA, GFRAA, ANIONGAP in the last 168 hours.  No results for input(s): PROT, ALBUMIN, AST, ALT, ALKPHOS, BILITOT in the last 168 hours. HematologyNo results for input(s): WBC, RBC, HGB, HCT, MCV, MCH, MCHC, RDW, PLT in the last 168 hours. Cardiac EnzymesNo results for input(s): TROPONINI in the last 168 hours. No results for input(s): TROPIPOC in the last 168 hours.  BNPNo results for input(s): BNP, PROBNP in the last 168 hours.  DDimer No results for input(s): DDIMER in the last 168 hours.  Radiology/Studies:  No results found.  Assessment and Plan:   Acute on chronic systolic (congestive) heart failure (HCC) Pt has evidence of volume overload on exam and I suspect he has low output CHF. F/U echo shows drop in his EF to 15-20%, minimal improvement on increased diuretics last 3 days  CKD (chronic kidney disease), stage III GFR 44- last SCr 1.71- 11/15/18 (not on ACE or ARB)  CAD S/P percutaneous coronary angioplasty CFX PCI in 2003, CTO of RCA with collaterals. Myoview April 2012-no ischemia, inferolateral scar  PAF (paroxysmal atrial fibrillation) ([place]) Currently V paced-on Couamdin  ICD BiV medtronic MDT ICD '07, gen change 2012, MDT BiV ICD 2017 with lead change.  Severity of Illness: The appropriate patient status for  this patient is OBSERVATION. Observation status is judged to be reasonable and necessary in order to provide the required intensity of service to ensure the patient's safety. The patient's presenting symptoms, physical exam findings, and initial radiographic and laboratory data in the context of their medical condition is felt to place them at decreased risk for further clinical deterioration. Furthermore, it is anticipated that the patient will be medically stable for discharge from the hospital within 2 midnights of admission. The following factors support the patient status of observation.   " The patient's presenting symptoms include SOB. " The physical  exam findings include CHF. " The initial radiographic and laboratory data are  new LVD.     For questions or updates, please contact [place] Please consult [email or web address] for contact info under   Signed, Kerin Ransom, [name]  11/22/2018 1:03 PM    Patient seen and examined. Agree with assessment and plan. Mr Carden a 73 year old gentleman who has a history of CAD with remote circumflex PCI in 2003 and documented chronic total occlusion of his RCA with left-to-right collaterals.  He has a history of an ischemic cardiomyopathy and in 2007 had ICD placed with subsequent generator change out in 2012 with by V ICD implantation.  He required lead replacement in 2017.  In 2017 EF was 35 to 40%.  He has a history of PAF, hypertension, renal insufficiency and and has been on anticoagulation.  Followed by Dr. Percival Spanish.  He was seen in the office last week with complaints of increased dyspnea on exertion, new onset lower extremity edema, and medical adjustment was made at that time.  There was renal insufficiency he apparently has not been a candidate for Entresto and has been on bisoprolol, low-dose digoxin, furosemide 40 mg daily, hydralazine 25 mg twice a day, spironolactone 12.5 mg daily in addition to warfarin anticoagulation.  An echo Doppler study was performed on November 19, 2018, and EF is now severely reduced at 15%.  He had mild AR and mild MR.  There was significant biatrial enlargement.  Pulmonary pressures were mildly increased.  He presented to the office today for follow-up evaluation with Kerin Ransom, [name].  He continues to experience shortness of breath with activity.  Admits to nausea and abdominal discomfort.  He had undergone recent GI evaluation in late December and reportedly a HIDA scan was negative.  A BNP was elevated at 1353 and hemoglobin was 12.  Recent creatinine had increased to 1.7.  By recent adjustment to his diuretic regimen due to recurrent symptomatology he is now admitted from  the office for IV therapy.  Consider addition of low-dose nitrate if blood pressure allows in combination with hydralazine.  We will ask the advanced heart failure team to see the patient while in the hospital since in the past he has seen Dr. Haroldine Laws an EF is now significantly further reduced.    Troy Sine, MD, [facility] 11/22/2018 5:10 PM

## 2018-11-22 NOTE — Telephone Encounter (Signed)
Left message for pt to call back. Script sent to pharmacy. 

## 2018-11-23 ENCOUNTER — Inpatient Hospital Stay (HOSPITAL_COMMUNITY): Payer: PPO

## 2018-11-23 ENCOUNTER — Observation Stay: Payer: Self-pay

## 2018-11-23 DIAGNOSIS — I493 Ventricular premature depolarization: Secondary | ICD-10-CM | POA: Diagnosis present

## 2018-11-23 DIAGNOSIS — G40909 Epilepsy, unspecified, not intractable, without status epilepticus: Secondary | ICD-10-CM | POA: Diagnosis present

## 2018-11-23 DIAGNOSIS — I272 Pulmonary hypertension, unspecified: Secondary | ICD-10-CM | POA: Diagnosis present

## 2018-11-23 DIAGNOSIS — I2582 Chronic total occlusion of coronary artery: Secondary | ICD-10-CM | POA: Diagnosis present

## 2018-11-23 DIAGNOSIS — I428 Other cardiomyopathies: Secondary | ICD-10-CM | POA: Diagnosis present

## 2018-11-23 DIAGNOSIS — Z881 Allergy status to other antibiotic agents status: Secondary | ICD-10-CM | POA: Diagnosis not present

## 2018-11-23 DIAGNOSIS — I482 Chronic atrial fibrillation, unspecified: Secondary | ICD-10-CM

## 2018-11-23 DIAGNOSIS — N179 Acute kidney failure, unspecified: Secondary | ICD-10-CM

## 2018-11-23 DIAGNOSIS — K219 Gastro-esophageal reflux disease without esophagitis: Secondary | ICD-10-CM | POA: Diagnosis present

## 2018-11-23 DIAGNOSIS — R0602 Shortness of breath: Secondary | ICD-10-CM | POA: Diagnosis present

## 2018-11-23 DIAGNOSIS — I472 Ventricular tachycardia: Secondary | ICD-10-CM | POA: Diagnosis present

## 2018-11-23 DIAGNOSIS — Z7189 Other specified counseling: Secondary | ICD-10-CM | POA: Diagnosis not present

## 2018-11-23 DIAGNOSIS — Z66 Do not resuscitate: Secondary | ICD-10-CM | POA: Diagnosis not present

## 2018-11-23 DIAGNOSIS — I130 Hypertensive heart and chronic kidney disease with heart failure and stage 1 through stage 4 chronic kidney disease, or unspecified chronic kidney disease: Secondary | ICD-10-CM | POA: Diagnosis present

## 2018-11-23 DIAGNOSIS — N183 Chronic kidney disease, stage 3 (moderate): Secondary | ICD-10-CM | POA: Diagnosis present

## 2018-11-23 DIAGNOSIS — J449 Chronic obstructive pulmonary disease, unspecified: Secondary | ICD-10-CM | POA: Diagnosis present

## 2018-11-23 DIAGNOSIS — E785 Hyperlipidemia, unspecified: Secondary | ICD-10-CM | POA: Diagnosis present

## 2018-11-23 DIAGNOSIS — E876 Hypokalemia: Secondary | ICD-10-CM | POA: Diagnosis present

## 2018-11-23 DIAGNOSIS — Z95810 Presence of automatic (implantable) cardiac defibrillator: Secondary | ICD-10-CM | POA: Diagnosis not present

## 2018-11-23 DIAGNOSIS — I447 Left bundle-branch block, unspecified: Secondary | ICD-10-CM | POA: Diagnosis present

## 2018-11-23 DIAGNOSIS — Z955 Presence of coronary angioplasty implant and graft: Secondary | ICD-10-CM | POA: Diagnosis not present

## 2018-11-23 DIAGNOSIS — Z515 Encounter for palliative care: Secondary | ICD-10-CM | POA: Diagnosis not present

## 2018-11-23 DIAGNOSIS — I4821 Permanent atrial fibrillation: Secondary | ICD-10-CM | POA: Diagnosis present

## 2018-11-23 DIAGNOSIS — I2510 Atherosclerotic heart disease of native coronary artery without angina pectoris: Secondary | ICD-10-CM | POA: Diagnosis present

## 2018-11-23 DIAGNOSIS — K760 Fatty (change of) liver, not elsewhere classified: Secondary | ICD-10-CM | POA: Diagnosis present

## 2018-11-23 DIAGNOSIS — Z8249 Family history of ischemic heart disease and other diseases of the circulatory system: Secondary | ICD-10-CM | POA: Diagnosis not present

## 2018-11-23 DIAGNOSIS — I255 Ischemic cardiomyopathy: Secondary | ICD-10-CM | POA: Diagnosis present

## 2018-11-23 DIAGNOSIS — I5023 Acute on chronic systolic (congestive) heart failure: Secondary | ICD-10-CM | POA: Diagnosis present

## 2018-11-23 LAB — BASIC METABOLIC PANEL
Anion gap: 9 (ref 5–15)
BUN: 32 mg/dL — ABNORMAL HIGH (ref 8–23)
CO2: 25 mmol/L (ref 22–32)
Calcium: 8.9 mg/dL (ref 8.9–10.3)
Chloride: 98 mmol/L (ref 98–111)
Creatinine, Ser: 1.86 mg/dL — ABNORMAL HIGH (ref 0.61–1.24)
GFR calc Af Amer: 40 mL/min — ABNORMAL LOW (ref >60)
GFR calc non Af Amer: 35 mL/min — ABNORMAL LOW (ref >60)
Glucose, Bld: 118 mg/dL — ABNORMAL HIGH (ref 70–99)
Potassium: 3.9 mmol/L (ref 3.5–5.1)
Sodium: 132 mmol/L — ABNORMAL LOW (ref 135–145)

## 2018-11-23 LAB — CBC
HCT: 35.4 % — ABNORMAL LOW (ref 39.0–52.0)
Hemoglobin: 11.6 g/dL — ABNORMAL LOW (ref 13.0–17.0)
MCH: 32 pg (ref 26.0–34.0)
MCHC: 32.8 g/dL (ref 30.0–36.0)
MCV: 97.5 fL (ref 80.0–100.0)
NRBC: 0 % (ref 0.0–0.2)
Platelets: 240 10*3/uL (ref 150–400)
RBC: 3.63 MIL/uL — AB (ref 4.22–5.81)
RDW: 14.5 % (ref 11.5–15.5)
WBC: 6.3 10*3/uL (ref 4.0–10.5)

## 2018-11-23 LAB — DIGOXIN LEVEL: Digoxin Level: 0.3 ng/mL — ABNORMAL LOW (ref 0.8–2.0)

## 2018-11-23 LAB — COOXEMETRY PANEL
Carboxyhemoglobin: 1.3 % (ref 0.5–1.5)
Methemoglobin: 1 % (ref 0.0–1.5)
O2 Saturation: 50.5 %
TOTAL HEMOGLOBIN: 12.2 g/dL (ref 12.0–16.0)

## 2018-11-23 LAB — PROTIME-INR
INR: 2.85
Prothrombin Time: 29.5 seconds — ABNORMAL HIGH (ref 11.4–15.2)

## 2018-11-23 MED ORDER — SALINE SPRAY 0.65 % NA SOLN
1.0000 | NASAL | Status: DC | PRN
  Administered 2018-11-23: 1 via NASAL
  Filled 2018-11-23 (×2): qty 44

## 2018-11-23 MED ORDER — SODIUM CHLORIDE 0.9% FLUSH
10.0000 mL | Freq: Two times a day (BID) | INTRAVENOUS | Status: DC
  Administered 2018-11-23 – 2018-11-27 (×7): 10 mL
  Administered 2018-11-27: 30 mL
  Administered 2018-11-28 – 2018-12-03 (×8): 10 mL

## 2018-11-23 MED ORDER — WARFARIN SODIUM 5 MG PO TABS
5.0000 mg | ORAL_TABLET | Freq: Once | ORAL | Status: AC
  Administered 2018-11-23: 5 mg via ORAL
  Filled 2018-11-23: qty 1

## 2018-11-23 MED ORDER — FUROSEMIDE 10 MG/ML IJ SOLN
80.0000 mg | Freq: Two times a day (BID) | INTRAMUSCULAR | Status: DC
  Administered 2018-11-23 – 2018-11-24 (×3): 80 mg via INTRAVENOUS
  Filled 2018-11-23 (×4): qty 8

## 2018-11-23 MED ORDER — SODIUM CHLORIDE 0.9% FLUSH: 10.0000 mL | INTRAVENOUS | Status: DC | PRN

## 2018-11-23 MED ORDER — MILRINONE LACTATE IN DEXTROSE 20-5 MG/100ML-% IV SOLN
0.1250 ug/kg/min | INTRAVENOUS | Status: DC
  Administered 2018-11-23 – 2018-11-25 (×3): 0.25 ug/kg/min via INTRAVENOUS
  Filled 2018-11-23 (×3): qty 100

## 2018-11-23 MED ORDER — ISOSORBIDE MONONITRATE ER 30 MG PO TB24
30.0000 mg | ORAL_TABLET | Freq: Every day | ORAL | Status: DC
  Administered 2018-11-23 – 2018-12-03 (×11): 30 mg via ORAL
  Filled 2018-11-23 (×11): qty 1

## 2018-11-23 NOTE — Plan of Care (Signed)
  Problem: Education: Goal: Ability to demonstrate management of disease process will improve Outcome: Progressing Goal: Ability to verbalize understanding of medication therapies will improve Outcome: Progressing   Problem: Activity: Goal: Capacity to carry out activities will improve Outcome: Progressing   Problem: Education: Goal: Knowledge of General Education information will improve Description: Including pain rating scale, medication(s)/side effects and non-pharmacologic comfort measures Outcome: Progressing   

## 2018-11-23 NOTE — Progress Notes (Signed)
RN attempted to call pt's wife twice to inform her pt is being transferred. Busy signal and no voicemail option.

## 2018-11-23 NOTE — Plan of Care (Signed)

## 2018-11-23 NOTE — Progress Notes (Signed)
RN aware of pt code status change.

## 2018-11-23 NOTE — Progress Notes (Signed)
Peripherally Inserted Central Catheter/Midline Placement  The IV Nurse has discussed with the patient and/or persons authorized to consent for the patient, the purpose of this procedure and the potential benefits and risks involved with this procedure.  The benefits include less needle sticks, lab draws from the catheter, and the patient may be discharged home with the catheter. Risks include, but not limited to, infection, bleeding, blood clot (thrombus formation), and puncture of an artery; nerve damage and irregular heartbeat and possibility to perform a PICC exchange if needed/ordered by physician.  Alternatives to this procedure were also discussed.  Bard Power PICC patient education guide, fact sheet on infection prevention and patient information card has been provided to patient /or left at bedside.  Placed by Jule Economy, RN and Maurene Capes, RN.  PICC/Midline Placement Documentation  PICC Double Lumen 08/09/29 PICC Right Basilic 39 cm 0 cm (Active)  Indication for Insertion or Continuance of Line Chronic illness with exacerbations (CF, Sickle Cell, etc.) 11/23/2018  1:08 PM  Exposed Catheter (cm) 0 cm 11/23/2018  1:08 PM  Site Assessment Clean;Dry;Intact 11/23/2018  1:08 PM  Lumen #1 Status Flushed;Saline locked;Blood return noted 11/23/2018  1:08 PM  Lumen #2 Status Flushed;Saline locked;Blood return noted 11/23/2018  1:08 PM  Dressing Type Transparent 11/23/2018  1:08 PM  Dressing Status Clean;Dry;Intact 11/23/2018  1:08 PM  Dressing Intervention New dressing 11/23/2018  1:08 PM  Dressing Change Due 11/30/18 11/23/2018  1:08 PM       Jorryn Casagrande, Nicolette Bang 11/23/2018, 1:09 PM

## 2018-11-23 NOTE — Progress Notes (Signed)
IV Team RN states she will let RN know when PICC line can be used. RN called report to SNF.

## 2018-11-23 NOTE — Consult Note (Addendum)
Advanced Heart Failure Team Consult Note   Primary Physician: [name], MD PCP-Cardiologist:  No primary care provider on file.  Reason for Consultation: Acute on chronic systolic CHF  HPI:    [name] is seen today for evaluation of acute on chronic systolic CHF at the request of Dr. Debara Pickett.   [name] is a 73 y.o. male with h/o CAD (s/p DES to LCx '03, residual total occlusion of RCA w/ collaterals, normal myoview 2012), ICM s/p Medtronic BiV ICD (upgrade in 2017), h/o Afib/flutter, HTN, HLD, CKD, and seizure disorder.   He was previously seen in CHF clinic due to ongoing abdominal pain felt to be related to CHF in 2013/2014. CT scan at that time showed diffuse mesenteric calcification but pain not felt to be intestinal claudication. He transiently required milrinone due to low output during an admission in 2013. In 2014, he was discharged to [name] with stable course.   Admitted from [name] 11/22/2018 with concerns for low output CHF. He has had varied symptoms worsening over the past 6+ months. Symptoms include nausea, abdominal pain, BLE edema, and worsening SOB. Had recent abdominal ultrasound showed some gallbladder thickening and a HIDA scan was obtained.He has a cystic lesion adjacent to the head of the pancreas which appeared unchanged. HIDA scan was negative per patient, but results not available in [place] from [name]. Repeat outpatient Echo as below showed drop in EF to 15% from 35-40% in 2017. He has had more abdominal pain, poor appetite, and DOE. Pertinent labs on admission included Cr 1.87, K 4.4, WBC 6.3, Hgb 11.6. Pro-BNP 1353 11/15/18. INR and TSH stable. EKG showed BiV pacing with underlying Afib.   He is feeling slightly better on admission. He is frustrated his heart is week again. States this all started with diarrhea and abdominal pain summer 2019, with extensive, unremarkable work up as above. He has become progressively more fatigued,  with poor appetite. He denies SOB with bathing or getting dressed, but has SOB with bending over. He has occasional, non-specific left chest pain (not clearly related to exertion, with no clear aggravating or relieving factors). He also has mid back pain, that in the past has been associated with volume overload.  He realizes he may be nearing end of life. He does NOT want to be on "life support". He does not want to be considered for LVAD, and does not want any surgery. He is very hesitant to even consider having a PICC placed, and would not want to go home with one.   ICD interrogated: Thoracic impedence trending down over past month, with elevated optivol. Optivol starting to trend back down on IV lasix. Pt activity ~ 3 hrs daily. He has been in Afib 365/24/7 as far back as the ICD interrogation goes (back into 2018). No VT/VF. BiV pacing ~90% of the time. Occasional NSVT. Personally reviewed.   (Complete interrogation in chart)  Echo 11/19/2018 LVEF 15%, Mild AI, Mild MR, Severe LAE, RV mildly dilated, moderately reduced, Mod RAE,  (This is significantly decreased from Echo 12/12/2015 with EF 35-40% with mild RV dilation, normal function at that time)  RHC 09/17/2012  RA = 22  RV = 67/17/22  PA = 65/41 (50)  PCW = 38 (v-waves to 45)  Fick cardiac output/index = 3.8/2.0  Thermo CO/CI = 2.3/1.2  PVR = 5.0 woods  FA sat = 92%  PA sat = 51%, 52%   CPX 10/20/12 Peak VO2:  17.1 ml/kg/min predicted peak VO2: 59.7% VE/VCO2 slope: 25.7 OUES: 2.13 Peak RER: 1.02 Ventilatory Threshold: 15 % predicted peak VO2: 52.4% Test was stopped early due excessive ventricular ectopy, afib with RVR and significant fall in BP.  Review of systems complete and found to be negative unless listed in HPI.    Home Medications Prior to Admission medications   Medication Sig Start Date End Date Taking? Authorizing Provider  allopurinol (ZYLOPRIM) 100 MG tablet Take 100 mg by mouth daily.   Yes [provider]    bisoprolol (ZEBETA) 5 MG tablet TAKE 1/2 TABLET BY MOUTH DAILY Patient taking differently: Take 2.5 mg by mouth daily.  02/01/18  Yes Minus Breeding, MD  Calcium-Magnesium 500-250 MG TABS Take 1 tablet by mouth daily.    Yes [provider]  carvedilol (COREG CR) 20 MG 24 hr capsule TAKE 1 CAPSULE BY MOUTH EVERY DAY Patient taking differently: Take 20 mg by mouth daily.  09/24/18  Yes Minus Breeding, MD  digoxin (LANOXIN) 0.125 MG tablet TAKE 1 TABLET BY MOUTH EVERY OTHER DAY (KEEP OFFICE VISIT) Patient taking differently: Take 0.0625 mg by mouth daily.  07/23/18  Yes Minus Breeding, MD  diphenhydrAMINE (BENADRYL) 25 mg capsule Take 25 mg by mouth every 6 (six) hours as needed for allergies.    Yes [provider]  fish oil-omega-3 fatty acids 1000 MG capsule Take 2 g by mouth daily.   Yes [provider]  furosemide (LASIX) 40 MG tablet Take 1 tablet (40 mg total) by mouth daily. 11/18/18 02/16/19 Yes Kilroy, Luke K, [name]  hydrALAZINE (APRESOLINE) 25 MG tablet Take 1 tablet (25 mg total) by mouth 2 (two) times daily. 07/28/18  Yes Minus Breeding, MD  loperamide (IMODIUM A-D) 2 MG tablet Take 2 mg by mouth as needed for diarrhea or loose stools.   Yes [provider]  magnesium oxide (MAG-OX) 400 MG tablet Take 1 tablet (400 mg total) by mouth daily. 03/19/15  Yes Deboraha Sprang, MD  montelukast (SINGULAIR) 10 MG tablet Take 10 mg by mouth daily.  11/18/17  Yes [provider]  Multiple Vitamin (MULTIVITAMIN WITH MINERALS) TABS Take 1 tablet by mouth daily.   Yes [provider]  nitroGLYCERIN (NITROSTAT) 0.4 MG SL tablet Place 0.4 mg under the tongue every 5 (five) minutes as needed for chest pain. (up to 3 doses) For chest pain 06/03/12  Yes Minus Breeding, MD  omeprazole (PRILOSEC) 40 MG capsule Take 1 capsule (40 mg total) by mouth every morning. 11/15/18  Yes [name], [name], [name]  spironolactone (ALDACTONE) 25 MG tablet TAKE HALF TABLET  DAILY Patient taking differently: Take 12.5 mg by mouth daily.  06/08/18  Yes Minus Breeding, MD  traMADol (ULTRAM) 50 MG tablet Take 1 tablet by mouth every 6 hours as needed for pain. Patient taking differently: Take 50 mg by mouth every 6 (six) hours as needed for moderate pain.  11/15/18  Yes [name], [name], [name]  warfarin (COUMADIN) 5 MG tablet TAKE 1 TO 1 AND 1/2 TABLETS BY MOUTH DAILY AS DIRECTED BY COUMADIN CLINIC Patient taking differently: Take 5-7.5 mg by mouth See admin instructions. Take 1 tablet by mouth daily expect on Saturday Take 1 1/2 tablet by mouth. 09/07/18  Yes Minus Breeding, MD  hyoscyamine (LEVSIN SL) 0.125 MG SL tablet Place 1 tablet (0.125 mg total) under the tongue every 4 (four) hours as needed. Patient not taking: Reported on 11/22/2018 11/22/18   [name], [name]  Past Medical History: Past Medical History:  Diagnosis Date  . 6949-lead    replaced 02/2011  . Acute on chronic renal insufficiency   . AICD (automatic cardioverter/defibrillator) present    Medtronic CRT  . Asthma   . Atrial flutter ([place])    s/p TEE guided cardioversion  . CHF (congestive heart failure) ([place])   . Chronic drug-induced interstitial lung disorders ([place])   . COPD (chronic obstructive pulmonary disease) ([place])    " very MILD "  . Coronary artery disease    total occlusion of the right coronary artery, left to right collaterals.  He had Cypher stenting to the circumflex in  July 2003  . Family hx of colon cancer    2 SISTERS  . Fatty liver    CT  . GERD (gastroesophageal reflux disease)   . Headache(784.0)   . Hyperlipemia   . Hypertension   . Hyponatremia   . Ischemic cardiomyopathy    Myoview 02/2011  EF 28%  Coronary artery disease  (total occlusion of the right coronary artery, left-to-right )  . LBBB (left bundle branch block)   . Nonsustained ventricular tachycardia ([place])   . Seizure disorder [facility])     Past Surgical History: Past Surgical History:  Procedure  Laterality Date  . CORONARY ANGIOPLASTY    . EP IMPLANTABLE DEVICE N/A 03/05/2016   Procedure: ICD  Generator Changeout;  Surgeon: Deboraha Sprang, MD;  Location: [facility];  Service: Cardiovascular;  Laterality: N/A;  . ESOPHAGOGASTRODUODENOSCOPY  09/13/2012   Procedure: ESOPHAGOGASTRODUODENOSCOPY (EGD);  Surgeon: Ladene Artist, MD,FACG;  Location: [facility];  Service: Endoscopy;  Laterality: N/A;  . HERNIA REPAIR    . INSERT / REPLACE / [place]  . RIGHT HEART CATHETERIZATION N/A 09/17/2012   Procedure: RIGHT HEART CATH;  Surgeon: Jolaine Artist, MD;  Location: [facility] CATH LAB;  Service: Cardiovascular;  Laterality: N/A;    Family History: Family History  Problem Relation Age of Onset  . Colon cancer Sister        BOTH SISTERS 1 DESEASED FROM COLON CANCER  . Colon cancer Sister   . Heart disease Mother   . Heart disease Father     Social History: Social History   Socioeconomic History  . Marital status: Married    Spouse name: Not on file  . Number of children: 1  . Years of education: Not on file  . Highest education level: Not on file  Occupational History  . Occupation: RETIRED    Employer: RETIRED  Social Needs  . Financial resource strain: Not on file  . Food insecurity:    Worry: Not on file    Inability: Not on file  . Transportation needs:    Medical: Not on file    Non-medical: Not on file  Tobacco Use  . Smoking status: Never Smoker  . Smokeless tobacco: Never Used  Substance and Sexual Activity  . Alcohol use: Yes    Comment: OCCASIONALLY NOT OFTEN  . Drug use: No  . Sexual activity: Not on file  Lifestyle  . Physical activity:    Days per week: Not on file    Minutes per session: Not on file  . Stress: Not on file  Relationships  . Social connections:    Talks on phone: Not on file    Gets together: Not on file    Attends religious service: Not on file    Active member of  club or organization: Not on file     Attends meetings of clubs or organizations: Not on file    Relationship status: Not on file  Other Topics Concern  . Not on file  Social History Narrative  . Not on file    Allergies:  Allergies  Allergen Reactions  . Bentyl [Dicyclomine Hcl] Other (See Comments)    Blisters in mouth, lips swelling    . Sulfa Antibiotics Shortness Of Breath  . Ace Inhibitors     Unknown  . Corzide [Nadolol-Bendroflumethiazide]     Unknown  . Diovan [Valsartan]     Unknown  . Flagyl [Metronidazole] Other (See Comments)    Unknown  . Omeprazole     Shut my kidneys down  . Other     Perfumes smells; paint smells; formaldehyde smells  . Shellfish Allergy   . Tape Other (See Comments)  . Amlodipine Besylate Other (See Comments)    Numbness to lips; made legs give out  . Codeine Itching and Rash    Itching  . Latex Itching and Rash  . Norvasc [Amlodipine Besylate] Other (See Comments)    Numbness to lips; made legs give out  . Penicillins Rash    Childhood reaction    Objective:    Vital Signs:   Temp:  [97.3 F (36.3 C)-97.8 F (36.6 C)] 97.3 F (36.3 C) (01/07 0438) Pulse Rate:  [72-75] 72 (01/07 0840) Resp:  [18-19] 18 (01/07 0438) BP: (109-115)/(72-79) 115/72 (01/07 0840) SpO2:  [97 %-100 %] 98 % (01/07 0438) Weight:  [75.8 kg] 75.8 kg (01/07 0438) Last BM Date: 11/22/18  Weight change: Filed Weights   11/23/18 0438  Weight: 75.8 kg    Intake/Output:   Intake/Output Summary (Last 24 hours) at 11/23/2018 0951 Last data filed at 11/23/2018 0948 Gross per 24 hour  Intake 1080 ml  Output 1150 ml  Net -70 ml      Physical Exam    General:  NAD HEENT: normal Neck: supple. JVP to jaw. Carotids 2+ bilat; no bruits. No lymphadenopathy or thyromegaly appreciated. Cor: PMI nondisplaced. Irregularly irregular. +S3.  Lungs: Slightly diminished basilar sounds.  Abdomen: soft, nontender, nondistended. No hepatosplenomegaly. No bruits or masses. Good bowel  sounds. Extremities: no cyanosis, clubbing, or rash. 1+ edema 1/2 to knees. Cool, but not cold.  Neuro: alert & orientedx3, cranial nerves grossly intact. moves all 4 extremities w/o difficulty. Affect pleasant  Telemetry   V paced 70s with underlying Afib, confirmed by ICD interrogation, personally reviewed.   EKG    V paced 70s with underlying Afib, personally reviewed.   Labs   Basic Metabolic Panel: Recent Labs  Lab 11/22/18 1700 11/22/18 1702 11/23/18 0507  NA 131*  --  132*  K 4.4  --  3.9  CL 98  --  98  CO2 25  --  25  GLUCOSE 126*  --  118*  BUN 30*  --  32*  CREATININE 1.87*  --  1.86*  CALCIUM 8.8*  --  8.9  MG  --  2.1  --     Liver Function Tests: Recent Labs  Lab 11/22/18 1700  AST 32  ALT 31  ALKPHOS 80  BILITOT 1.6*  PROT 6.9  ALBUMIN 3.4*   No results for input(s): LIPASE, AMYLASE in the last 168 hours. No results for input(s): AMMONIA in the last 168 hours.  CBC: Recent Labs  Lab 11/22/18 1702 11/23/18 0507  WBC 5.3 6.3  NEUTROABS 3.4  --  HGB 11.1* 11.6*  HCT 34.3* 35.4*  MCV 97.7 97.5  PLT 234 240    Cardiac Enzymes: No results for input(s): CKTOTAL, CKMB, CKMBINDEX, TROPONINI in the last 168 hours.  BNP: BNP (last 3 results) No results for input(s): BNP in the last 8760 hours.  ProBNP (last 3 results) Recent Labs    11/15/18 1001  PROBNP 1,353.0*     CBG: No results for input(s): GLUCAP in the last 168 hours.  Coagulation Studies: Recent Labs    11/22/18 1702 11/23/18 0507  LABPROT 30.0* 29.5*  INR 2.92 2.85     Imaging    No results found.   Medications:     Current Medications: . allopurinol  100 mg Oral Daily  . carvedilol  20 mg Oral Daily  . digoxin  0.0625 mg Oral Daily  . furosemide  40 mg Intravenous BID  . hydrALAZINE  25 mg Oral BID  . montelukast  10 mg Oral QHS  . multivitamin with minerals  1 tablet Oral Daily  . pantoprazole  40 mg Oral Daily  . spironolactone  12.5 mg Oral  Daily  . vitamin C  500 mg Oral Daily  . warfarin  5 mg Oral ONCE-1800  . Warfarin - Pharmacist Dosing Inpatient   Does not apply q1800     Infusions: . ondansetron [facility]) IV         Patient Profile   CHRISTIANO BLANDON is a 73 y.o. male with h/o CAD (s/p DES to LCx '03, residual total occlusion of RCA w/ collaterals, normal myoview 2012), ICM s/p Medtronic BiV ICD (upgrade in 2017), h/o Afib/flutter, HTN, HLD, CKD, and seizure disorder.   Admitted from [name] 11/22/2018 with concerns for low output CHF.   Assessment/Plan   1. Acute on chronic systolic CHF s/p BiV Medtronic ICD - Echo as above with EF down to 15%, previously 35-40%.  - Volume status elevated on exam - Place PICC for CVP/Coox, see discussion below.  - Increase lasix to 80 mg IV BID. Suspect he will need inotropes at least temporarily.  - Continue hydralazine 25 mg TID - Lisinopril on hold. Consider Losartan with plans to transition to [facility] as tolerated (Of note, he has been intolerant in the past to increases of his lisinopril, so very likely he may not tolerate Entresto).  - Continue spiro 12.5 mg daily - Continue digoxin 0.0625 mg daily. Level stable at 0.3 11/23/2018 - Pt does NOT want consideration for advanced therapies, which may include home inotrope support.   2. CAD s/p PCI - He has occasional non-specific left chest pain, and a pain in his mid back that may be due to volume overload vs anginal equivalent. Ideally, will need repeat L/RHC if Cr tolerates.  - CFX PCI in 2003, CTO of RCA with collaterals. - Myoview April 2012-no ischemia, inferolateral scar  3. Permanent A-fib  - Currently V paced  - Continue coumadin.  - Will get ICD interrogation for burden. Suspect now chronic  4. AKI on CKD III  - Baseline Unclear. Has ranged from 1.4 - 1.9 in the past several years.  - Cr 1.86 today. Suspect cardiorenal syndrome.   5. HTN - Will adjust medication in setting of treating his CHF.   6.  Seizure Disorder - Reported in chart. This is distant. He has not had a seizure since his 20-30s. He is not on medication. Reports having several "grand-mal" seizures with no clear etiology in his youth.  7. Abdominal pain -  Work up per pt report negative. CT of the abdomen and pelvis, abdominal ultrasound and CCK HIDA scan all done 10/2018 with no acute findings in the abdomen, no evidence of cholecystitis and noted stable pancreatic body cyst. - Sees [facility]. On Levsin for cramping/diarrhea.  - Suspect a portion if not all of this is related to low output CHF.   8. Goals of Care - Made partial code, expect that patient will be made DNR/DNI, but wishes to think about CPR more and discuss with his wife.  - Pt and wife agreed to palliative care consult.  - He does NOT want LVAD consideration, and very hesitant to get temporary PICC, as he would not want one at home. Spent > 30 minutes discussing disease state and options with patient. He agrees to temporary PICC for guidance and palliative consult as above. Ideally, he would like to go home on oral medications only, and palliative/hospice care if needed.   ADDENDUM: Coox 50%. Will start milrinone 0.25 mcg/kg/min.   Medication concerns reviewed with patient and pharmacy team. Barriers identified: None at this time.   Length of Stay: 0  Annamaria Helling  11/23/2018, 9:51 AM  Advanced Heart Failure Team Pager [phone] (M-F; 7a - 4p)  Please contact [facility] for night-coverage after hours (4p -7a ) and weekends on [email or web address]  Patient seen with PA, agree with the above note.   He has had the insidious onset of exertional dyspnea, poor appetite/abdominal fullness, and lower extremity edema.  Echo showed EF down to 15%.  He has now been admitted for CHF management.  He is BiV pacing 90% of the time.   PICC line placed today, co-ox 50%.    On exam, JVP 14 cm with HJR, regular S1S2 with 2/6 HSM LLSB, 1+ edema to knees  bilaterally.   Assessment/Plan: 1. Acute on chronic systolic CHF: Echo (1/70) with EF 15%, down from 35-40% previously.  Medtronic CRT-D.  Has known ischemic cardiomyopathy, hospitalization a number of years ago required milrinone.  No recent chest pain, only complaint has been increased dyspnea.  Creatinine up to 1.8.  Co-ox 50%, he is volume overloaded on exam.  - Follow CVP and co-ox off PICC.  - Milrinone 0.25 - Lasix 80 mg IV bid.   - Continue hydralazine/Imdur, digoxin, and spironolactone.  Digoxin level ok.  - Has not been on ACEI/ARB/ARNI due to past AKI.  - Would benefit from RHC/LHC after he is diuresed.  Concern that he may have worsened CAD leading to fall in EF.  - He tells [place] that he would not want advanced therapies.   2. CAD: History of PCI LCx in 2003, has known occluded RCA.  No chest pain but concerned for worsened CAD given fall in EF.  - We discussed coronary angiography, he is willing to have this done eventually.  Would like to get him diuresed first and get creatinine to stabilize.  - On warfarin so no ASA.   - He has not wanted to take a statin.  3. AKI: Mild.  Hopefully will improve with diuresis.  4. Atrial fibrillation: Chronic atrial fibrillation. 90% BiV pacing this admission, not ideal but he has not wanted AV nodal ablation in the past. 5. He wants to avoid invasive procedures/surgeries as much as possible.  He is DNI.  Will have palliative care see him for goals of care.   Loralie Champagne 11/23/2018 4:29 PM

## 2018-11-23 NOTE — Progress Notes (Signed)
ANTICOAGULATION CONSULT NOTE - Initial Consult  Pharmacy Consult for warfarin Indication: atrial fibrillation  Allergies  Allergen Reactions  . Bentyl [Dicyclomine Hcl] Other (See Comments)    Blisters in mouth, lips swelling    . Sulfa Antibiotics Shortness Of Breath  . Ace Inhibitors     Unknown  . Corzide [Nadolol-Bendroflumethiazide]     Unknown  . Diovan [Valsartan]     Unknown  . Flagyl [Metronidazole] Other (See Comments)    Unknown  . Omeprazole     Shut my kidneys down  . Other     Perfumes smells; paint smells; formaldehyde smells  . Shellfish Allergy   . Tape Other (See Comments)  . Amlodipine Besylate Other (See Comments)    Numbness to lips; made legs give out  . Codeine Itching and Rash    Itching  . Latex Itching and Rash  . Norvasc [Amlodipine Besylate] Other (See Comments)    Numbness to lips; made legs give out  . Penicillins Rash    Childhood reaction    Patient Measurements: Heparin Dosing Weight: 77.7 kg  Vital Signs: Temp: 97.3 F (36.3 C) (01/07 0438) Temp Source: Oral (01/07 0438) BP: 115/72 (01/07 0840) Pulse Rate: 72 (01/07 0840)  Labs: Recent Labs    11/22/18 1700 11/22/18 1702 11/23/18 0507  HGB  --  11.1* 11.6*  HCT  --  34.3* 35.4*  PLT  --  234 240  LABPROT  --  30.0* 29.5*  INR  --  2.92 2.85  CREATININE 1.87*  --  1.86*    Estimated Creatinine Clearance: 33.7 mL/min (A) (by C-G formula based on SCr of 1.86 mg/dL (H)).   Medical History: Past Medical History:  Diagnosis Date  . 6949-lead    replaced 02/2011  . Acute on chronic renal insufficiency   . AICD (automatic cardioverter/defibrillator) present    Medtronic CRT  . Asthma   . Atrial flutter ([place])    s/p TEE guided cardioversion  . CHF (congestive heart failure) ([place])   . Chronic drug-induced interstitial lung disorders ([place])   . COPD (chronic obstructive pulmonary disease) ([place])    " very MILD "  . Coronary artery disease    total occlusion of the  right coronary artery, left to right collaterals.  He had Cypher stenting to the circumflex in  July 2003  . Family hx of colon cancer    2 SISTERS  . Fatty liver    CT  . GERD (gastroesophageal reflux disease)   . Headache(784.0)   . Hyperlipemia   . Hypertension   . Hyponatremia   . Ischemic cardiomyopathy    Myoview 02/2011  EF 28%  Coronary artery disease  (total occlusion of the right coronary artery, left-to-right )  . LBBB (left bundle branch block)   . Nonsustained ventricular tachycardia ([place])   . Seizure disorder ([facility])     Medications:  Scheduled:  . allopurinol  100 mg Oral Daily  . carvedilol  20 mg Oral Daily  . digoxin  0.0625 mg Oral Daily  . furosemide  40 mg Intravenous BID  . hydrALAZINE  25 mg Oral BID  . montelukast  10 mg Oral QHS  . multivitamin with minerals  1 tablet Oral Daily  . pantoprazole  40 mg Oral Daily  . spironolactone  12.5 mg Oral Daily  . vitamin C  500 mg Oral Daily  . Warfarin - Pharmacist Dosing Inpatient   Does not apply q1800    Assessment: 16 yom  with ICM with low output symptoms (abdominal pain, dyspnea on exertion with ECHO showing 15-20%) - on warfarin PTA for hx Afib.   INR is 2.85 this am. Last dose was 1/6 at 0900 (prior to admission). Hgb 11.1, plt 234. No s/sx of bleeding.  PTA regimen 5 mg daily except 7.5 mg on Saturday.   Goal of Therapy:  INR 2-3 Monitor platelets by anticoagulation protocol: Yes   Plan:  Warfarin 5 mg po x 1 tonight Monitor daily INR and CBC  Alanda Slim, PharmD, [facility] Clinical Pharmacist Please see AMION for all Pharmacists' Contact Phone Numbers 11/23/2018, 8:50 AM

## 2018-11-24 LAB — FECAL LACTOFERRIN, QUANT
Fecal Lactoferrin: NEGATIVE
MICRO NUMBER: 3198
SPECIMEN QUALITY: ADEQUATE

## 2018-11-24 LAB — CBC
HCT: 33.3 % — ABNORMAL LOW (ref 39.0–52.0)
Hemoglobin: 11.1 g/dL — ABNORMAL LOW (ref 13.0–17.0)
MCH: 32.4 pg (ref 26.0–34.0)
MCHC: 33.3 g/dL (ref 30.0–36.0)
MCV: 97.1 fL (ref 80.0–100.0)
Platelets: 239 10*3/uL (ref 150–400)
RBC: 3.43 MIL/uL — ABNORMAL LOW (ref 4.22–5.81)
RDW: 14.5 % (ref 11.5–15.5)
WBC: 6.1 10*3/uL (ref 4.0–10.5)
nRBC: 0 % (ref 0.0–0.2)

## 2018-11-24 LAB — BASIC METABOLIC PANEL
Anion gap: 10 (ref 5–15)
BUN: 33 mg/dL — ABNORMAL HIGH (ref 8–23)
CO2: 27 mmol/L (ref 22–32)
Calcium: 9.1 mg/dL (ref 8.9–10.3)
Chloride: 95 mmol/L — ABNORMAL LOW (ref 98–111)
Creatinine, Ser: 1.7 mg/dL — ABNORMAL HIGH (ref 0.61–1.24)
GFR calc Af Amer: 45 mL/min — ABNORMAL LOW (ref >60)
GFR calc non Af Amer: 39 mL/min — ABNORMAL LOW (ref >60)
Glucose, Bld: 100 mg/dL — ABNORMAL HIGH (ref 70–99)
Potassium: 3.4 mmol/L — ABNORMAL LOW (ref 3.5–5.1)
SODIUM: 132 mmol/L — AB (ref 135–145)

## 2018-11-24 LAB — COOXEMETRY PANEL
Carboxyhemoglobin: 1.6 % — ABNORMAL HIGH (ref 0.5–1.5)
Methemoglobin: 1.6 % — ABNORMAL HIGH (ref 0.0–1.5)
O2 Saturation: 79.7 %
Total hemoglobin: 11.3 g/dL — ABNORMAL LOW (ref 12.0–16.0)

## 2018-11-24 LAB — PROTIME-INR
INR: 2.42
Prothrombin Time: 26 seconds — ABNORMAL HIGH (ref 11.4–15.2)

## 2018-11-24 LAB — PANCREATIC ELASTASE, FECAL: Pancreatic Elastase-1, Stool: 291 mcg/g

## 2018-11-24 LAB — MAGNESIUM: MAGNESIUM: 1.9 mg/dL (ref 1.7–2.4)

## 2018-11-24 MED ORDER — WARFARIN SODIUM 5 MG PO TABS: 5.0000 mg | ORAL_TABLET | Freq: Once | ORAL | Status: DC

## 2018-11-24 MED ORDER — SPIRONOLACTONE 25 MG PO TABS
25.0000 mg | ORAL_TABLET | Freq: Every day | ORAL | Status: DC
  Administered 2018-11-24 – 2018-12-03 (×10): 25 mg via ORAL
  Filled 2018-11-24 (×10): qty 1

## 2018-11-24 MED ORDER — POTASSIUM CHLORIDE CRYS ER 20 MEQ PO TBCR
40.0000 meq | EXTENDED_RELEASE_TABLET | ORAL | Status: AC
  Administered 2018-11-24 (×2): 40 meq via ORAL
  Filled 2018-11-24: qty 2

## 2018-11-24 MED ORDER — MAGNESIUM SULFATE IN D5W 1-5 GM/100ML-% IV SOLN
1.0000 g | Freq: Once | INTRAVENOUS | Status: AC
  Administered 2018-11-24: 1 g via INTRAVENOUS
  Filled 2018-11-24: qty 100

## 2018-11-24 MED ORDER — FLUTICASONE PROPIONATE 50 MCG/ACT NA SUSP
1.0000 | Freq: Every day | NASAL | Status: DC
  Administered 2018-11-24 – 2018-12-01 (×8): 1 via NASAL
  Filled 2018-11-24: qty 16

## 2018-11-24 MED ORDER — POTASSIUM CHLORIDE CRYS ER 20 MEQ PO TBCR
60.0000 meq | EXTENDED_RELEASE_TABLET | Freq: Once | ORAL | Status: DC
  Filled 2018-11-24: qty 3

## 2018-11-24 MED ORDER — POLYETHYLENE GLYCOL 3350 17 G PO PACK
17.0000 g | PACK | Freq: Every day | ORAL | Status: DC | PRN
  Administered 2018-11-27 – 2018-12-02 (×3): 17 g via ORAL
  Filled 2018-11-24 (×5): qty 1

## 2018-11-24 NOTE — Consult Note (Addendum)
Consultation Note Date: 11/24/2018   Patient Name: [name]  DOB: [birth year]  MRN: [id number]  Age / Sex: 73 y.o., male  PCP: [name] Roger, MD Referring Physician: Larey Dresser, MD  Reason for Consultation: Establishing goals of care  HPI/Patient Profile: Admitted from [facility] clinic 11/22/2018 with concerns for low output with EF down to 15% from 35-40.    Clinical Assessment and Goals of Care: Patient is sitting on the side of the bed with his nephew at bedside. His nephew jokes and is very involved in the conversation. [name] has been married since 65. He has a step child.   He states his wife wanted to stay with him, but went home late last night because the bedside chair did not recline, and she recently had back surgery. She is at home at this time, and he is unsure of when she will return.    He states in the summer he was unable to go outside because of the heat. When it cooled down, he noticed when he walked he was short of breath. He thought it was because of stomach issues, but the work up was negative. He has been watching his sodium intake, and his appetite has decreased.    He states he is aware of his cardiac condition. He states he would not want an LVAD as it is too cumbersome, and is not a candidate for a heart transplant. He states he is supposed to have a heart cath which he is amenable to, and hopeful there will be something that can be fixed in the procedure.  Patient amenable for return tomorrow for [place] conversation as his nephew was present and he wanted to visit with him.    Left name and number to schedule an appt for tomorrow if he finds out when his wife will be present and would like her present for conversation.  If he does not call today with a time for tomorrow, will return in the morning for further GOC.     SUMMARY OF RECOMMENDATIONS   Patient is amenable to  heart cath. Will return tomorrow for further [place].    Code Status/Advance Care Planning:  Full code    Symptom Management:   Continue nasal saline for nasal dryness.  Prognosis:   Poor overall.   Discharge Planning: To Be Determined      Primary Diagnoses: Present on Admission: . Acute on chronic systolic (congestive) heart failure ([place]) . CKD (chronic kidney disease), stage III ([place]) . Essential hypertension . ICD BiV  medtronic . Ischemic cardiomyopathy   I have reviewed the medical record, interviewed the patient and family, and examined the patient. The following aspects are pertinent.  Past Medical History:  Diagnosis Date  . 6949-lead    replaced 02/2011  . Acute on chronic renal insufficiency   . AICD (automatic cardioverter/defibrillator) present    Medtronic CRT  . Asthma   . Atrial flutter ([place])    s/p TEE guided cardioversion  . CHF (congestive heart  failure) ([place])   . Chronic drug-induced interstitial lung disorders ([place])   . COPD (chronic obstructive pulmonary disease) ([place])    " very MILD "  . Coronary artery disease    total occlusion of the right coronary artery, left to right collaterals.  He had Cypher stenting to the circumflex in  July 2003  . Family hx of colon cancer    2 SISTERS  . Fatty liver    CT  . GERD (gastroesophageal reflux disease)   . Headache(784.0)   . Hyperlipemia   . Hypertension   . Hyponatremia   . Ischemic cardiomyopathy    Myoview 02/2011  EF 28%  Coronary artery disease  (total occlusion of the right coronary artery, left-to-right )  . LBBB (left bundle branch block)   . Nonsustained ventricular tachycardia ([place])   . Seizure disorder [facility])    Social History   Socioeconomic History  . Marital status: Married    Spouse name: Not on file  . Number of children: 1  . Years of education: Not on file  . Highest education level: Not on file  Occupational History  . Occupation: RETIRED    Employer: RETIRED  Social  Needs  . Financial resource strain: Not on file  . Food insecurity:    Worry: Not on file    Inability: Not on file  . Transportation needs:    Medical: Not on file    Non-medical: Not on file  Tobacco Use  . Smoking status: Never Smoker  . Smokeless tobacco: Never Used  Substance and Sexual Activity  . Alcohol use: Yes    Comment: OCCASIONALLY NOT OFTEN  . Drug use: No  . Sexual activity: Not on file  Lifestyle  . Physical activity:    Days per week: Not on file    Minutes per session: Not on file  . Stress: Not on file  Relationships  . Social connections:    Talks on phone: Not on file    Gets together: Not on file    Attends religious service: Not on file    Active member of club or organization: Not on file    Attends meetings of clubs or organizations: Not on file    Relationship status: Not on file  Other Topics Concern  . Not on file  Social History Narrative  . Not on file   Family History  Problem Relation Age of Onset  . Colon cancer Sister        BOTH SISTERS 1 DESEASED FROM COLON CANCER  . Colon cancer Sister   . Heart disease Mother   . Heart disease Father    Scheduled Meds: . allopurinol  100 mg Oral Daily  . digoxin  0.0625 mg Oral Daily  . fluticasone  1 spray Each Nare Daily  . furosemide  80 mg Intravenous BID  . hydrALAZINE  25 mg Oral BID  . isosorbide mononitrate  30 mg Oral Daily  . montelukast  10 mg Oral QHS  . multivitamin with minerals  1 tablet Oral Daily  . pantoprazole  40 mg Oral Daily  . sodium chloride flush  10-40 mL Intracatheter Q12H  . spironolactone  25 mg Oral Daily  . vitamin C  500 mg Oral Daily  . warfarin  5 mg Oral ONCE-1800  . Warfarin - Pharmacist Dosing Inpatient   Does not apply q1800   Continuous Infusions: . milrinone 0.25 mcg/kg/min (11/24/18 0630)  . ondansetron (ZOFRAN) IV  PRN Meds:.acetaminophen, diphenhydrAMINE, loperamide, nitroGLYCERIN, ondansetron (ZOFRAN) IV, polyethylene glycol, sodium  chloride, sodium chloride flush, traMADol Medications Prior to Admission:  Prior to Admission medications   Medication Sig Start Date End Date Taking? Authorizing Provider  allopurinol (ZYLOPRIM) 100 MG tablet Take 100 mg by mouth daily.   Yes [provider]  bisoprolol (ZEBETA) 5 MG tablet TAKE 1/2 TABLET BY MOUTH DAILY Patient taking differently: Take 2.5 mg by mouth daily.  02/01/18  Yes Minus Breeding, MD  Calcium-Magnesium 500-250 MG TABS Take 1 tablet by mouth daily.    Yes [provider]  carvedilol (COREG CR) 20 MG 24 hr capsule TAKE 1 CAPSULE BY MOUTH EVERY DAY Patient taking differently: Take 20 mg by mouth daily.  09/24/18  Yes Minus Breeding, MD  digoxin (LANOXIN) 0.125 MG tablet TAKE 1 TABLET BY MOUTH EVERY OTHER DAY (KEEP OFFICE VISIT) Patient taking differently: Take 0.0625 mg by mouth daily.  07/23/18  Yes Minus Breeding, MD  diphenhydrAMINE (BENADRYL) 25 mg capsule Take 25 mg by mouth every 6 (six) hours as needed for allergies.    Yes [provider]  fish oil-omega-3 fatty acids 1000 MG capsule Take 2 g by mouth daily.   Yes [provider]  furosemide (LASIX) 40 MG tablet Take 1 tablet (40 mg total) by mouth daily. 11/18/18 02/16/19 Yes Kilroy, Luke K, [name]  hydrALAZINE (APRESOLINE) 25 MG tablet Take 1 tablet (25 mg total) by mouth 2 (two) times daily. 07/28/18  Yes Minus Breeding, MD  loperamide (IMODIUM A-D) 2 MG tablet Take 2 mg by mouth as needed for diarrhea or loose stools.   Yes [provider]  magnesium oxide (MAG-OX) 400 MG tablet Take 1 tablet (400 mg total) by mouth daily. 03/19/15  Yes Deboraha Sprang, MD  montelukast (SINGULAIR) 10 MG tablet Take 10 mg by mouth daily.  11/18/17  Yes [provider]  Multiple Vitamin (MULTIVITAMIN WITH MINERALS) TABS Take 1 tablet by mouth daily.   Yes [provider]  nitroGLYCERIN (NITROSTAT) 0.4 MG SL tablet Place 0.4 mg under the tongue every 5 (five) minutes as needed  for chest pain. (up to 3 doses) For chest pain 06/03/12  Yes Minus Breeding, MD  omeprazole (PRILOSEC) 40 MG capsule Take 1 capsule (40 mg total) by mouth every morning. 11/15/18  Yes [name], [name], [name]  spironolactone (ALDACTONE) 25 MG tablet TAKE HALF TABLET DAILY Patient taking differently: Take 12.5 mg by mouth daily.  06/08/18  Yes Minus Breeding, MD  traMADol (ULTRAM) 50 MG tablet Take 1 tablet by mouth every 6 hours as needed for pain. Patient taking differently: Take 50 mg by mouth every 6 (six) hours as needed for moderate pain.  11/15/18  Yes [name], [name], [name]  warfarin (COUMADIN) 5 MG tablet TAKE 1 TO 1 AND 1/2 TABLETS BY MOUTH DAILY AS DIRECTED BY COUMADIN CLINIC Patient taking differently: Take 5-7.5 mg by mouth See admin instructions. Take 1 tablet by mouth daily expect on Saturday Take 1 1/2 tablet by mouth. 09/07/18  Yes Minus Breeding, MD  hyoscyamine (LEVSIN SL) 0.125 MG SL tablet Place 1 tablet (0.125 mg total) under the tongue every 4 (four) hours as needed. Patient not taking: Reported on 11/22/2018 11/22/18   [name], [name]   Allergies  Allergen Reactions  . Bentyl [Dicyclomine Hcl] Other (See Comments)    Blisters in mouth, lips swelling    . Sulfa Antibiotics Shortness Of Breath  . Ace Inhibitors  Unknown  . Corzide [Nadolol-Bendroflumethiazide]     Unknown  . Diovan [Valsartan]     Unknown  . Flagyl [Metronidazole] Other (See Comments)    Unknown  . Omeprazole     Shut my kidneys down  . Other     Perfumes smells; paint smells; formaldehyde smells  . Shellfish Allergy   . Tape Other (See Comments)  . Amlodipine Besylate Other (See Comments)    Numbness to lips; made legs give out  . Codeine Itching and Rash    Itching  . Latex Itching and Rash  . Norvasc [Amlodipine Besylate] Other (See Comments)    Numbness to lips; made legs give out  . Penicillins Rash    Childhood reaction   Review of Systems  Constitutional: Positive for  activity change.  Respiratory: Positive for shortness of breath.   Neurological: Positive for weakness.    Physical Exam Pulmonary:     Effort: Pulmonary effort is normal.  Neurological:     Comments: Alert and oriented.      Vital Signs: BP 109/61 (BP Location: Left Arm)   Pulse 83   Temp 97.8 F (36.6 C) (Oral)   Resp 18   Wt 74.3 kg   SpO2 97%   BMI 24.92 kg/m  Pain Scale: 0-10   Pain Score: 6    SpO2: SpO2: 97 % O2 Device:SpO2: 97 % O2 Flow Rate: .   IO: Intake/output summary:   Intake/Output Summary (Last 24 hours) at 11/24/2018 1252 Last data filed at 11/24/2018 1132 Gross per 24 hour  Intake 1234.64 ml  Output 2950 ml  Net -1715.36 ml    LBM: Last BM Date: 11/22/18 Baseline Weight: Weight: 75.8 kg(scale c) Most recent weight: Weight: 74.3 kg     Palliative Assessment/Data: 40%     Time In: 35 min Time Out: 1:05 Time Total:12:30 Greater than 50%  of this time was spent counseling and coordinating care related to the above assessment and plan.  Signed by: Asencion Gowda, NP   Please contact Palliative Medicine Team phone at [phone] for questions and concerns.  For individual provider: See Shea Evans

## 2018-11-24 NOTE — Progress Notes (Addendum)
Advanced Heart Failure Rounding Note  PCP-Cardiologist: No primary care provider on file.   Subjective:    Admitted from [facility] clinic 11/22/2018 with concerns for low output with EF down to 15% from 35-40. CHF team consulted. Initial mixed venous saturation 50%. Started on milrinone 0.25 mcg/kg/min.   Coox 79.7% this am on milrinone 0.25 mcg/kg/min. CVP at least 8-9 cm.   Cr 1.86 -> 1.70. K 3.4. Negative 1.2 L and down 4 lbs.   Feeling a little better this am. Denies SOB in room. Abdominal pain "easing off". No BM. Asking for Miralax. Asking for Flonase for nasal congestion.   Echo 11/19/2018 LVEF 15%, Mild AI, Mild MR, Severe LAE, RV mildly dilated, moderately reduced, Mod RAE,  (This is significantly decreased from Echo 12/12/2015 with EF 35-40% with mild RV dilation, normal function at that time)  Objective:   Weight Range: 74.3 kg Body mass index is 24.92 kg/m.   Vital Signs:   Temp:  [97 F (36.1 C)-98.4 F (36.9 C)] 97.8 F (36.6 C) (01/08 0530) Pulse Rate:  [72-83] 83 (01/08 0530) BP: (108-116)/(61-78) 109/61 (01/08 0530) SpO2:  [97 %-98 %] 97 % (01/08 0530) Weight:  [74.3 kg] 74.3 kg (01/08 0530) Last BM Date: 11/22/18  Weight change: Filed Weights   11/23/18 0438 11/24/18 0530  Weight: 75.8 kg 74.3 kg   Intake/Output:   Intake/Output Summary (Last 24 hours) at 11/24/2018 0816 Last data filed at 11/24/2018 0500 Gross per 24 hour  Intake 1018.91 ml  Output 2250 ml  Net -1231.09 ml    Physical Exam    General:  Elderly appearing. NAD.  HEENT: Normal Neck: Supple. JVP remains elevated to jaw with mild HJR. Carotids 2+ bilat; no bruits. No lymphadenopathy or thyromegaly appreciated. Cor: PMI nondisplaced. Irregular. + S3.  Lungs: Clear Abdomen: Soft, nontender, nondistended. No hepatosplenomegaly. No bruits or masses. Good bowel sounds. Extremities: No cyanosis, clubbing, or rash. 1-2+ ankle edema.  Neuro: Alert & orientedx3, cranial nerves grossly intact. moves  all 4 extremities w/o difficulty. Affect pleasant   Telemetry   BiV paced 70-80s, Underlying Afib by ICD interrogation, personally reviewed.   EKG    No new tracings.    Labs    CBC Recent Labs    11/22/18 1702 11/23/18 0507 11/24/18 0525  WBC 5.3 6.3 6.1  NEUTROABS 3.4  --   --   HGB 11.1* 11.6* 11.1*  HCT 34.3* 35.4* 33.3*  MCV 97.7 97.5 97.1  PLT 234 240 297   Basic Metabolic Panel Recent Labs    11/22/18 1702 11/23/18 0507 11/24/18 0525  NA  --  132* 132*  K  --  3.9 3.4*  CL  --  98 95*  CO2  --  25 27  GLUCOSE  --  118* 100*  BUN  --  32* 33*  CREATININE  --  1.86* 1.70*  CALCIUM  --  8.9 9.1  MG 2.1  --  1.9   Liver Function Tests Recent Labs    11/22/18 1700  AST 32  ALT 31  ALKPHOS 80  BILITOT 1.6*  PROT 6.9  ALBUMIN 3.4*   No results for input(s): LIPASE, AMYLASE in the last 72 hours. Cardiac Enzymes No results for input(s): CKTOTAL, CKMB, CKMBINDEX, TROPONINI in the last 72 hours.  BNP: BNP (last 3 results) No results for input(s): BNP in the last 8760 hours.  ProBNP (last 3 results) Recent Labs    11/15/18 1001  PROBNP 1,353.0*  D-Dimer No results for input(s): DDIMER in the last 72 hours. Hemoglobin A1C No results for input(s): HGBA1C in the last 72 hours. Fasting Lipid Panel No results for input(s): CHOL, HDL, LDLCALC, TRIG, CHOLHDL, LDLDIRECT in the last 72 hours. Thyroid Function Tests Recent Labs    11/22/18 1702  TSH 3.329    Other results:   Imaging    Dg Chest Port 1 View  Result Date: 11/23/2018 CLINICAL DATA:  PICC line placement EXAM: PORTABLE CHEST 1 VIEW COMPARISON:  Portable exam 1314 hours compared to 09/17/2012 FINDINGS: RIGHT arm PICC line tip projects over SVC at the level of the carina. LEFT subclavian ICD leads project over RIGHT atrium, RIGHT ventricle and coronary sinus. Enlargement of cardiac silhouette with pulmonary vascular congestion. Atherosclerotic calcification aorta. Minimal  subsegmental atelectasis at lung bases. Lungs otherwise clear. No pleural effusion or pneumothorax. IMPRESSION: Tip of RIGHT arm PICC line projects over proximal SVC. Enlargement of cardiac silhouette post ICD. Minimal bibasilar atelectasis. Electronically Signed   By: Lavonia Dana M.D.   On: 11/23/2018 13:21   [place] Ekg Site Rite  Result Date: 11/23/2018 If Site Rite image not attached, placement could not be confirmed due to current cardiac rhythm.     Medications:     Scheduled Medications: . allopurinol  100 mg Oral Daily  . digoxin  0.0625 mg Oral Daily  . furosemide  80 mg Intravenous BID  . hydrALAZINE  25 mg Oral BID  . isosorbide mononitrate  30 mg Oral Daily  . montelukast  10 mg Oral QHS  . multivitamin with minerals  1 tablet Oral Daily  . pantoprazole  40 mg Oral Daily  . sodium chloride flush  10-40 mL Intracatheter Q12H  . spironolactone  12.5 mg Oral Daily  . vitamin C  500 mg Oral Daily  . Warfarin - Pharmacist Dosing Inpatient   Does not apply q1800     Infusions: . milrinone 0.25 mcg/kg/min (11/24/18 0630)  . ondansetron (ZOFRAN) IV       PRN Medications:  acetaminophen, diphenhydrAMINE, loperamide, nitroGLYCERIN, ondansetron (ZOFRAN) IV, sodium chloride, sodium chloride flush, traMADol    Patient Profile   [name] is a 73 y.o. male  with h/o CAD (s/p DES to LCx '03, residual total occlusion of RCA w/ collaterals, normal myoview 2012), ICM s/p Medtronic BiV ICD (upgrade in 2017), h/o Afib/flutter, HTN, HLD, CKD, and seizure disorder.   Admitted from [facility] clinic 11/22/2018 with concerns for low output CHF.   Assessment/Plan   1. Acute on chronic systolic CHF s/p BiV Medtronic ICD - Echo as above with EF down to 15%, previously 35-40%.  - Volume status remains elevated. CVP 8-9 cm, but appears higher on exam.    - Coox 79.7% on milrinone 0.25 mcg/kg/min. (Initial coox 50%) - Continue lasix 80 mg BID with decent response.  - Continue hydralazine  25 mg TID - Lisinopril stopped in 2018 due to AKI.  - Consider Losartan with plans to transition to [facility] as tolerated (Of note, he has been intolerant in the past to increases of his lisinopril, so very likely he may not tolerate Entresto).  - Increase spiro to 25 mg daily. (Had Ks in the 5.2 - 5.4 range recently as outpatient, so will need to follow closely) - Continue digoxin 0.0625 mg daily. Level stable at 0.3 11/23/2018 - Pt does NOT want consideration for advanced therapies, which may include home inotrope support.   2. CAD s/p PCI - He has occasional non-specific  left chest pain, and a pain in his mid back that may be due to volume overload vs anginal equivalent. Ideally, will need repeat L/RHC if Cr tolerates and pt agrees. No change. - CFX PCI in 2003, CTO of RCA with collaterals. - Myoview April 2012-no ischemia,inferolateral scar  3. Permanent A-fib  - Chronic by ICD interrogation 11/23/2018 - BiV paced ~ 90% of the time.  - Continue coumadin.   4. AKI on CKD III  - Baseline Unclear. Has ranged from 1.4 - 1.9 in the past several years.  - Cr 1.86 -> 1.70 with diuresis and inotrope support. Suspect cardiorenal syndrome.   5. HTN - Meds as above.   6. Seizure Disorder - Reported in chart. This is distant. He has not had a seizure since his 20-30s. He is not on medication. Reports having several "grand-mal" seizures with no clear etiology in his youth.  7. Abdominal pain - Work up per pt report negative. CT of the abdomen and pelvis, abdominal ultrasound and CCK HIDA scan all done 10/2018 with no acute findings in the abdomen, no evidence of cholecystitis and noted stable pancreatic body cyst. - Sees [facility]. On Levsin for cramping/diarrhea.  - Suspect a portion if not all of this is related to low output CHF.  - Improved with diuresis and inotrope support.   8. Goals of Care - Made partial code, expect that patient will be made DNR/DNI, but wishes to think about  CPR more and discuss with his wife. They have agreed to palliative care consult.   - He does NOT want LVAD consideration, and was very hesitant to get temporary PICC, as he would not want one at home. Pts goal is to go home on oral medications only, and palliative/hospice care if needed.  Continue to diurese. Will need R/LHC once diuresed.  Medication concerns reviewed with patient and pharmacy team. Barriers identified: None at this time.   Length of Stay: 1  Shirley Friar, [place]  11/24/2018, 8:16 AM  Advanced Heart Failure Team Pager [phone] (M-F; 7a - 4p)  Please contact [facility] for night-coverage after hours (4p -7a ) and weekends on [email or web address]   Patient seen and examined with the above-signed Advanced Practice Provider and/or Housestaff. I personally reviewed laboratory data, imaging studies and relevant notes. I independently examined the patient and formulated the important aspects of the plan. I have edited the note to reflect any of my changes or salient points. I have personally discussed the plan with the patient and/or family.  PICC placed last night and co-ox c/w with low output HF. No feeling somewhat better on milrinone. Ab symptoms improving. He has really struggled over past 6 months and EF now down to 10-15%. He likely has end-stage HF. We talked about options and he is not interested in LVAD (and probably not a suitable candidate). We may be willing to consider home milrinone but he would really like to wean off it if possible. Will continue diuresis. Agree with plan for cath to assess for progressive CAD contributing to worsening CM. Will need to hold coumadin and switch to heparin first. Continue diuresis. AKI improving with inotropic support.   Glori Bickers, MD  6:41 PM

## 2018-11-24 NOTE — Progress Notes (Signed)
ANTICOAGULATION CONSULT NOTE - [place] for warfarin Indication: atrial fibrillation  Allergies  Allergen Reactions  . Bentyl [Dicyclomine Hcl] Other (See Comments)    Blisters in mouth, lips swelling    . Sulfa Antibiotics Shortness Of Breath  . Ace Inhibitors     Unknown  . Corzide [Nadolol-Bendroflumethiazide]     Unknown  . Diovan [Valsartan]     Unknown  . Flagyl [Metronidazole] Other (See Comments)    Unknown  . Omeprazole     Shut my kidneys down  . Other     Perfumes smells; paint smells; formaldehyde smells  . Shellfish Allergy   . Tape Other (See Comments)  . Amlodipine Besylate Other (See Comments)    Numbness to lips; made legs give out  . Codeine Itching and Rash    Itching  . Latex Itching and Rash  . Norvasc [Amlodipine Besylate] Other (See Comments)    Numbness to lips; made legs give out  . Penicillins Rash    Childhood reaction    Patient Measurements: Heparin Dosing Weight: 77.7 kg  Vital Signs: Temp: 97.8 F (36.6 C) (01/08 0530) Temp Source: Oral (01/08 0530) BP: 109/61 (01/08 0530) Pulse Rate: 83 (01/08 0530)  Labs: Recent Labs    11/22/18 1700  11/22/18 1702 11/23/18 0507 11/24/18 0525  HGB  --    < > 11.1* 11.6* 11.1*  HCT  --   --  34.3* 35.4* 33.3*  PLT  --   --  234 240 239  LABPROT  --   --  30.0* 29.5* 26.0*  INR  --   --  2.92 2.85 2.42  CREATININE 1.87*  --   --  1.86* 1.70*   < > = values in this interval not displayed.    Estimated Creatinine Clearance: 36.9 mL/min (A) (by C-G formula based on SCr of 1.7 mg/dL (H)).   Medical History: Past Medical History:  Diagnosis Date  . 6949-lead    replaced 02/2011  . Acute on chronic renal insufficiency   . AICD (automatic cardioverter/defibrillator) present    Medtronic CRT  . Asthma   . Atrial flutter ([place])    s/p TEE guided cardioversion  . CHF (congestive heart failure) ([place])   . Chronic drug-induced interstitial lung disorders ([place])   .  COPD (chronic obstructive pulmonary disease) ([place])    " very MILD "  . Coronary artery disease    total occlusion of the right coronary artery, left to right collaterals.  He had Cypher stenting to the circumflex in  July 2003  . Family hx of colon cancer    2 SISTERS  . Fatty liver    CT  . GERD (gastroesophageal reflux disease)   . Headache(784.0)   . Hyperlipemia   . Hypertension   . Hyponatremia   . Ischemic cardiomyopathy    Myoview 02/2011  EF 28%  Coronary artery disease  (total occlusion of the right coronary artery, left-to-right )  . LBBB (left bundle branch block)   . Nonsustained ventricular tachycardia ([place])   . Seizure disorder (H[facility])     Medications:  Scheduled:  . allopurinol  100 mg Oral Daily  . digoxin  0.0625 mg Oral Daily  . furosemide  80 mg Intravenous BID  . hydrALAZINE  25 mg Oral BID  . isosorbide mononitrate  30 mg Oral Daily  . montelukast  10 mg Oral QHS  . multivitamin with minerals  1 tablet Oral Daily  . pantoprazole  40 mg Oral Daily  . potassium chloride  60 mEq Oral Once  . sodium chloride flush  10-40 mL Intracatheter Q12H  . spironolactone  12.5 mg Oral Daily  . vitamin C  500 mg Oral Daily  . Warfarin - Pharmacist Dosing Inpatient   Does not apply q1800    Assessment: 8 yoM admitted with low output HF on warfarin PTA for PAF. INR therapeutic this am, CBC stable, no Sx bleeding noted.   PTA regimen 5 mg daily except 7.5 mg on Saturday.   Goal of Therapy:  INR 2-3 Monitor platelets by anticoagulation protocol: Yes   Plan:  -Warfarin 5mg  PO x1 tonight -Daily protime  Arrie Senate, PharmD, BCPS Clinical Pharmacist [phone] Please check AMION for all [facility] numbers 11/24/2018

## 2018-11-25 LAB — BASIC METABOLIC PANEL
Anion gap: 10 (ref 5–15)
BUN: 25 mg/dL — ABNORMAL HIGH (ref 8–23)
CO2: 27 mmol/L (ref 22–32)
Calcium: 8.8 mg/dL — ABNORMAL LOW (ref 8.9–10.3)
Chloride: 93 mmol/L — ABNORMAL LOW (ref 98–111)
Creatinine, Ser: 1.55 mg/dL — ABNORMAL HIGH (ref 0.61–1.24)
GFR calc Af Amer: 50 mL/min — ABNORMAL LOW (ref >60)
GFR calc non Af Amer: 43 mL/min — ABNORMAL LOW (ref >60)
Glucose, Bld: 109 mg/dL — ABNORMAL HIGH (ref 70–99)
POTASSIUM: 3.7 mmol/L (ref 3.5–5.1)
SODIUM: 130 mmol/L — AB (ref 135–145)

## 2018-11-25 LAB — CBC
HEMATOCRIT: 33.4 % — AB (ref 39.0–52.0)
Hemoglobin: 11.2 g/dL — ABNORMAL LOW (ref 13.0–17.0)
MCH: 32.7 pg (ref 26.0–34.0)
MCHC: 33.5 g/dL (ref 30.0–36.0)
MCV: 97.4 fL (ref 80.0–100.0)
Platelets: 234 10*3/uL (ref 150–400)
RBC: 3.43 MIL/uL — ABNORMAL LOW (ref 4.22–5.81)
RDW: 14.4 % (ref 11.5–15.5)
WBC: 5.1 10*3/uL (ref 4.0–10.5)
nRBC: 0 % (ref 0.0–0.2)

## 2018-11-25 LAB — MAGNESIUM: MAGNESIUM: 2 mg/dL (ref 1.7–2.4)

## 2018-11-25 LAB — COOXEMETRY PANEL
Carboxyhemoglobin: 1.7 % — ABNORMAL HIGH (ref 0.5–1.5)
Carboxyhemoglobin: 1.5 % (ref 0.5–1.5)
Methemoglobin: 1.5 % (ref 0.0–1.5)
Methemoglobin: 1.5 % (ref 0.0–1.5)
O2 Saturation: 96.9 %
O2 Saturation: 59.7 %
TOTAL HEMOGLOBIN: 12 g/dL (ref 12.0–16.0)
Total hemoglobin: 10.5 g/dL — ABNORMAL LOW (ref 12.0–16.0)

## 2018-11-25 LAB — PROTIME-INR
INR: 2.35
Prothrombin Time: 25.4 seconds — ABNORMAL HIGH (ref 11.4–15.2)

## 2018-11-25 MED ORDER — MILRINONE LACTATE IN DEXTROSE 20-5 MG/100ML-% IV SOLN: 0.1250 ug/kg/min | INTRAVENOUS | Status: DC

## 2018-11-25 MED ORDER — POTASSIUM CHLORIDE CRYS ER 20 MEQ PO TBCR
40.0000 meq | EXTENDED_RELEASE_TABLET | Freq: Once | ORAL | Status: AC
  Administered 2018-11-25: 40 meq via ORAL
  Filled 2018-11-25: qty 2

## 2018-11-25 MED ORDER — FUROSEMIDE 80 MG PO TABS
80.0000 mg | ORAL_TABLET | Freq: Every day | ORAL | Status: DC
  Administered 2018-11-25 – 2018-11-30 (×6): 80 mg via ORAL
  Filled 2018-11-25 (×6): qty 1

## 2018-11-25 MED ORDER — MILRINONE LACTATE IN DEXTROSE 20-5 MG/100ML-% IV SOLN
0.1250 ug/kg/min | INTRAVENOUS | Status: DC
  Administered 2018-11-25 – 2018-11-26 (×2): 0.125 ug/kg/min via INTRAVENOUS
  Filled 2018-11-25: qty 100

## 2018-11-25 NOTE — Progress Notes (Signed)
ANTICOAGULATION CONSULT NOTE - Follow-Up Consult  Pharmacy Consult for warfarin>>heparin Indication: atrial fibrillation  Allergies  Allergen Reactions  . Bentyl [Dicyclomine Hcl] Other (See Comments)    Blisters in mouth, lips swelling    . Sulfa Antibiotics Shortness Of Breath  . Ace Inhibitors     Unknown  . Corzide [Nadolol-Bendroflumethiazide]     Unknown  . Diovan [Valsartan]     Unknown  . Flagyl [Metronidazole] Other (See Comments)    Unknown  . Omeprazole     Shut my kidneys down  . Other     Perfumes smells; paint smells; formaldehyde smells  . Shellfish Allergy   . Tape Other (See Comments)  . Amlodipine Besylate Other (See Comments)    Numbness to lips; made legs give out  . Codeine Itching and Rash    Itching  . Latex Itching and Rash  . Norvasc [Amlodipine Besylate] Other (See Comments)    Numbness to lips; made legs give out  . Penicillins Rash    Childhood reaction    Patient Measurements: Heparin Dosing Weight: 77.7 kg  Vital Signs: Temp: 98.1 F (36.7 C) (01/09 0412) Temp Source: Oral (01/09 0412) BP: 104/70 (01/09 0412) Pulse Rate: 81 (01/09 0412)  Labs: Recent Labs    11/23/18 0507 11/24/18 0525 11/25/18 0414  HGB 11.6* 11.1* 11.2*  HCT 35.4* 33.3* 33.4*  PLT 240 239 234  LABPROT 29.5* 26.0* 25.4*  INR 2.85 2.42 2.35  CREATININE 1.86* 1.70* 1.55*    Estimated Creatinine Clearance: 40.5 mL/min (A) (by C-G formula based on SCr of 1.55 mg/dL (H)).   Medical History: Past Medical History:  Diagnosis Date  . 6949-lead    replaced 02/2011  . Acute on chronic renal insufficiency   . AICD (automatic cardioverter/defibrillator) present    Medtronic CRT  . Asthma   . Atrial flutter ([place])    s/p TEE guided cardioversion  . CHF (congestive heart failure) ([place])   . Chronic drug-induced interstitial lung disorders ([place])   . COPD (chronic obstructive pulmonary disease) ([place])    " very MILD "  . Coronary artery disease    total  occlusion of the right coronary artery, left to right collaterals.  He had Cypher stenting to the circumflex in  July 2003  . Family hx of colon cancer    2 SISTERS  . Fatty liver    CT  . GERD (gastroesophageal reflux disease)   . Headache(784.0)   . Hyperlipemia   . Hypertension   . Hyponatremia   . Ischemic cardiomyopathy    Myoview 02/2011  EF 28%  Coronary artery disease  (total occlusion of the right coronary artery, left-to-right )  . LBBB (left bundle branch block)   . Nonsustained ventricular tachycardia ([place])   . Seizure disorder (HCC)     Medications:  Scheduled:  . allopurinol  100 mg Oral Daily  . digoxin  0.0625 mg Oral Daily  . fluticasone  1 spray Each Nare Daily  . furosemide  80 mg Oral Daily  . hydrALAZINE  25 mg Oral BID  . isosorbide mononitrate  30 mg Oral Daily  . montelukast  10 mg Oral QHS  . multivitamin with minerals  1 tablet Oral Daily  . pantoprazole  40 mg Oral Daily  . potassium chloride  40 mEq Oral Once  . sodium chloride flush  10-40 mL Intracatheter Q12H  . spironolactone  25 mg Oral Daily  . vitamin C  500 mg Oral Daily  Assessment: 70 yoM admitted with low output HF on warfarin PTA for PAF. INR therapeutic this am, CBC stable, no Sx bleeding noted. Pharmacy to hold warfarin and start IV heparin once INR <2 in anticipation of cardiac cath.   PTA regimen 5 mg daily except 7.5 mg on Saturday.   Goal of Therapy:  INR 2-3 Monitor platelets by anticoagulation protocol: Yes   Plan:  -Hold warfarin again tonight -INR tomorrow - heparin if < 2.0   Arrie Senate, PharmD, BCPS Clinical Pharmacist [phone] Please check AMION for all [facility] numbers 11/25/2018

## 2018-11-25 NOTE — Progress Notes (Addendum)
Advanced Heart Failure Rounding Note  PCP-Cardiologist: No primary care provider on file.   Subjective:    Admitted from [facility] clinic 11/22/2018 with concerns for low output with EF down to 15% from 35-40. CHF team consulted. Initial mixed venous saturation 50%. Started on milrinone 0.25 mcg/kg/min on 1/8  Lasix increased yesterday. Weight down 1.5 pounds. Feels much better. No longer orthopneic. Ab pain much improved.   Coox 96.9% (inaccurate) this am on milrinone 0.25 mcg/kg/min. CVP 5 (measured personally) Cr 1.86 -> 1.70 -> 1.55. K 3.7.   Coumadin on hold. INR 2.35  Echo 11/19/2018 LVEF 15%, Mild AI, Mild MR, Severe LAE, RV mildly dilated, moderately reduced, Mod RAE,  (This is significantly decreased from Echo 12/12/2015 with EF 35-40% with mild RV dilation, normal function at that time)  Objective:   Weight Range: 73.6 kg Body mass index is 24.66 kg/m.   Vital Signs:   Temp:  [97.4 F (36.3 C)-98.1 F (36.7 C)] 98.1 F (36.7 C) (01/09 0412) Pulse Rate:  [81-96] 81 (01/09 0412) Resp:  [18] 18 (01/08 2024) BP: (104-135)/(70-78) 104/70 (01/09 0412) SpO2:  [95 %-97 %] 96 % (01/09 0412) Weight:  [73.6 kg] 73.6 kg (01/09 0500) Last BM Date: 11/24/18(per pt)  Weight change: Filed Weights   11/23/18 0438 11/24/18 0530 11/25/18 0500  Weight: 75.8 kg 74.3 kg 73.6 kg   Intake/Output:   Intake/Output Summary (Last 24 hours) at 11/25/2018 0724 Last data filed at 11/25/2018 0427 Gross per 24 hour  Intake 965.73 ml  Output 3650 ml  Net -2684.27 ml    Physical Exam    General:  Elderly. Lying flat in bed.  No resp difficulty HEENT: normal Neck: supple. no JVD. Carotids 2+ bilat; no bruits. No lymphadenopathy or thryomegaly appreciated. Cor: PMI nondisplaced. Mildly irregular rate & rhythm. No rubs, gallops or murmurs. Lungs: clear Abdomen: soft, nontender, nondistended. No hepatosplenomegaly. No bruits or masses. Good bowel sounds. Extremities: no cyanosis, clubbing, rash,  edema + TED hose. RUE PICC Neuro: alert & orientedx3, cranial nerves grossly intact. moves all 4 extremities w/o difficulty. Affect pleasant   Telemetry   BiV paced 70-80s with occasional unpaced beats , Underlying Afib by ICD interrogation, personally reviewed.   EKG    No new tracings.    Labs    CBC Recent Labs    11/22/18 1702  11/24/18 0525 11/25/18 0414  WBC 5.3   < > 6.1 5.1  NEUTROABS 3.4  --   --   --   HGB 11.1*   < > 11.1* 11.2*  HCT 34.3*   < > 33.3* 33.4*  MCV 97.7   < > 97.1 97.4  PLT 234   < > 239 234   < > = values in this interval not displayed.   Basic Metabolic Panel Recent Labs    11/24/18 0525 11/25/18 0414  NA 132* 130*  K 3.4* 3.7  CL 95* 93*  CO2 27 27  GLUCOSE 100* 109*  BUN 33* 25*  CREATININE 1.70* 1.55*  CALCIUM 9.1 8.8*  MG 1.9 2.0   Liver Function Tests Recent Labs    11/22/18 1700  AST 32  ALT 31  ALKPHOS 80  BILITOT 1.6*  PROT 6.9  ALBUMIN 3.4*   No results for input(s): LIPASE, AMYLASE in the last 72 hours. Cardiac Enzymes No results for input(s): CKTOTAL, CKMB, CKMBINDEX, TROPONINI in the last 72 hours.  BNP: BNP (last 3 results) No results for input(s): BNP in the last  8760 hours.  ProBNP (last 3 results) Recent Labs    11/15/18 1001  PROBNP 1,353.0*     D-Dimer No results for input(s): DDIMER in the last 72 hours. Hemoglobin A1C No results for input(s): HGBA1C in the last 72 hours. Fasting Lipid Panel No results for input(s): CHOL, HDL, LDLCALC, TRIG, CHOLHDL, LDLDIRECT in the last 72 hours. Thyroid Function Tests Recent Labs    11/22/18 1702  TSH 3.329    Other results:   Imaging    No results found.   Medications:     Scheduled Medications: . allopurinol  100 mg Oral Daily  . digoxin  0.0625 mg Oral Daily  . fluticasone  1 spray Each Nare Daily  . furosemide  80 mg Intravenous BID  . hydrALAZINE  25 mg Oral BID  . isosorbide mononitrate  30 mg Oral Daily  . montelukast  10 mg  Oral QHS  . multivitamin with minerals  1 tablet Oral Daily  . pantoprazole  40 mg Oral Daily  . sodium chloride flush  10-40 mL Intracatheter Q12H  . spironolactone  25 mg Oral Daily  . vitamin C  500 mg Oral Daily    Infusions: . milrinone 0.25 mcg/kg/min (11/25/18 0100)  . ondansetron (ZOFRAN) IV      PRN Medications: acetaminophen, diphenhydrAMINE, loperamide, nitroGLYCERIN, ondansetron (ZOFRAN) IV, polyethylene glycol, sodium chloride, sodium chloride flush, traMADol    Patient Profile   [name] is a 73 y.o. male  with h/o CAD (s/p DES to LCx '03, residual total occlusion of RCA w/ collaterals, normal myoview 2012), ICM s/p Medtronic BiV ICD (upgrade in 2017), h/o Afib/flutter, HTN, HLD, CKD, and seizure disorder.   Admitted from [facility] clinic 11/22/2018 with concerns for low output CHF.   Assessment/Plan   1. Acute on chronic systolic CHF s/p BiV Medtronic ICD with low output - Echo as above with EF down to 15%, previously 35-40%.  - Co-ox and clinical status much improved with milrinone though suspect this am co-ox inacurrate (Initial coox 50%) - Will drop milrinone to 0.125 and recheck co-ox later today - Change lasix back to po  - Continue hydralazine 25 mg TID and Imdur 30 daily - Lisinopril stopped in 2018 due to AKI. Has ARBs listed as allergy. Son no ACE/ARB/ARNI  - Continue spiro to 25 mg daily. (Had Ks in the 5.2 - 5.4 range recently as outpatient, so will need to follow closely) - Continue digoxin 0.0625 mg daily. Level stable at 0.3 11/23/2018 - No candidate for transplant and would not want LVAD. Says he would consider home milrinone if he needs it to maintain QOL   2. CAD s/p PCI - He has occasional non-specific left chest pain, and a pain in his mid back that may be due to volume overload vs anginal equivalent. Ideally, will need repeat L/RHC if Cr tolerates and pt agrees. No change. - CFX PCI in 2003, CTO of RCA with collaterals. - Myoview April 2012-no  ischemia,inferolateral scar - Will plan R/L heart cath to see if ischemia responsible for dropping EF once INR 1.7 or less  - hold coumadin.   3. Permanent A-fib  - Chronic by ICD interrogation 11/23/2018 - BiV paced ~ 90% of the time.  - Holding coumadin for cath. Heparin when INR < 2.0  4. AKI on CKD III  - Baseline Unclear. Has ranged from 1.4 - 1.9 in the past several years.  - Likely due to cardiorenal syndrome  - Cr 1.86 ->  1.70 -> 1.55  with diuresis and inotrope support.   5. Seizure Disorder - Reported in chart. This is distant. He has not had a seizure since his 20-30s. He is not on medication. Reports having several "grand-mal" seizures with no clear etiology in his youth.  6. Abdominal pain - Work up per pt report negative. CT of the abdomen and pelvis, abdominal ultrasound and CCK HIDA scan all done 10/2018 with no acute findings in the abdomen, no evidence of cholecystitis and noted stable pancreatic body cyst. - Sees [facility]. On Levsin for cramping/diarrhea.  - Suspect mostly related to low output CHF.  - Improved with diuresis and inotrope support.   6. Hypokalemia - supp K  7. Goals of Care - Made partial code, expect that patient will be made DNR/DNI, but wishes to think about CPR more and discuss with his wife.  - Palliative Care team following for [place] - He does NOT want LVAD consideration   Medication concerns reviewed with patient and pharmacy team. Barriers identified: None at this time.   Length of Stay: 2  Glori Bickers, MD  11/25/2018, 7:24 AM  Advanced Heart Failure Team Pager [phone] (M-F; 7a - 4p)  Please contact [facility] for night-coverage after hours (4p -7a ) and weekends on [email or web address]

## 2018-11-25 NOTE — Progress Notes (Addendum)
Daily Progress Note   Patient Name: [name]       Date: 11/25/2018 DOB: 04/27/1946  Age: 73 y.o. MRN#: [id number] Attending Physician: Larey Dresser, MD Primary Care Physician: Nona Dell, Corene Cornea, MD Admit Date: 11/22/2018  Reason for Consultation/Follow-up: Establishing goals of care  Subjective: Patient is sitting on the side of the bed with wife at bedside. He states he slept fairly well last night, and he is feeling better. He states he would be okay with discharging with Milrinone.    We discussed his diagnosis, prognosis, GOC, EOL wishes disposition and options.  A detailed discussion was had today regarding advanced directives.  Concepts specific to code status, artifical feeding and hydration, IV antibiotics and rehospitalization were discussed.  The difference between an aggressive medical intervention path and a comfort care path was discussed.  Values and goals of care important to patient and family were attempted to be elicited.  Discussed limitations of medical interventions to prolong quality of life for this patient at this time in this situation and discussed the concept of human mortality.  Concept of Hospice and Palliative Care were discussed extensively as he states he may decide to stop Milrinone at some point depending on quality of life. He states he had a family member who had what appeared to be an uncomfortable death with hospice. He states he called 911 once because he could not breathe, and is afraid of that feeling upon the dying process.   Natural trajectory and expectations at EOL were discussed.  Questions and concerns addressed.      He states around 2013 his EF dropped very low. He states he was told he did not have long, but God increased his EF and did  not take him. He states God is in control of all things, and states "at some point everyone dies."He is very realistic about his current health. He states quality of life is paramount. A quality of life that would be acceptable to him would mean returning to the hospital for adjustments or needs, but not repetitive trips to the hospital. He would be okay with having assistance with ADL's temporarily, but not long term. He would be okay with a wheelchair, but would never want to be bed bound. He enjoys reading and going to watch his goats.  He would not want chest compressions, shocks, or intubation for cardio/pulmonary arrest. He states "if I'm gone, let me go in peace".  He would want intubation for up to 1 week for respiratory distress/ failure not manageable by BIPAP for treatable issues such as pulmonary edema or PNA. He states "if it is something that can be fixed and I could have a quality of life, I would want it fixed, but if it can't be, let me go". He has had family members that have had dialysis, and would never want it. He would never want a feeding tube. He would never want an LVAD.    I completed a MOST form today and the signed original was placed in the chart. A photocopy was placed in the chart to be scanned into EMR. The patient outlined their wishes for the following treatment decisions:  Cardiopulmonary Resuscitation: Do Not Attempt Resuscitation (DNR/No CPR)  Medical Interventions: Full Scope of Treatment: Use intubation, advanced airway interventions, mechanical ventilation, cardioversion as indicated, medical treatment, IV fluids, etc, also provide comfort measures. Transfer to the hospital if indicated  Antibiotics: Antibiotics if indicated  IV Fluids: IV fluids if indicated  Feeding Tube: No feeding tube     Length of Stay: 2  Current Medications: Scheduled Meds:  . allopurinol  100 mg Oral Daily  . digoxin  0.0625 mg Oral Daily  . fluticasone  1 spray Each Nare Daily    . furosemide  80 mg Oral Daily  . hydrALAZINE  25 mg Oral BID  . isosorbide mononitrate  30 mg Oral Daily  . montelukast  10 mg Oral QHS  . multivitamin with minerals  1 tablet Oral Daily  . pantoprazole  40 mg Oral Daily  . sodium chloride flush  10-40 mL Intracatheter Q12H  . spironolactone  25 mg Oral Daily  . vitamin C  500 mg Oral Daily    Continuous Infusions: . milrinone 0.125 mcg/kg/min (11/25/18 0843)  . ondansetron (ZOFRAN) IV      PRN Meds: acetaminophen, diphenhydrAMINE, loperamide, nitroGLYCERIN, ondansetron (ZOFRAN) IV, polyethylene glycol, sodium chloride, sodium chloride flush, traMADol  Physical Exam Constitutional:      Appearance: Normal appearance.  Pulmonary:     Effort: Pulmonary effort is normal.  Skin:    General: Skin is warm and dry.             Vital Signs: BP 112/75   Pulse 92   Temp 97.6 F (36.4 C) (Oral)   Resp 18   Wt 73.6 kg   SpO2 97%   BMI 24.66 kg/m  SpO2: SpO2: 97 % O2 Device: O2 Device: Room Air O2 Flow Rate:    Intake/output summary:   Intake/Output Summary (Last 24 hours) at 11/25/2018 1043 Last data filed at 11/25/2018 0427 Gross per 24 hour  Intake 725.73 ml  Output 3450 ml  Net -2724.27 ml   LBM: Last BM Date: 11/24/18(per pt) Baseline Weight: Weight: 75.8 kg(scale c) Most recent weight: Weight: 73.6 kg       Palliative Assessment/Data: 40%    Flowsheet Rows     Most Recent Value  Intake Tab  Referral Department  Cardiology  Unit at Time of Referral  Cardiac/Telemetry Unit  Palliative Care Primary Diagnosis  Cardiac  Date Notified  11/23/18  Palliative Care Type  New Palliative care  Reason for referral  Clarify Goals of Care  Date of Admission  11/23/18  Date first seen by Palliative Care  11/24/18  # of days Palliative  referral response time  1 Day(s)  # of days IP prior to Palliative referral  0  Clinical Assessment  Psychosocial & Spiritual Assessment  Palliative Care Outcomes      Patient  Active Problem List   Diagnosis Date Noted  . Essential hypertension 12/14/2017  . CAD S/P percutaneous coronary angioplasty 05/27/2017  . CKD (chronic kidney disease), stage III ([place]) 05/27/2017  . Encounter for therapeutic drug monitoring 02/22/2014  . NSVT (nonsustained ventricular tachycardia) ([place]) 09/19/2012  . Transaminitis 09/10/2012  . Encounter for long-term (current) use of anticoagulants 06/04/2011  . ICD BiV  medtronic   . Ischemic cardiomyopathy   . Acute on chronic systolic (congestive) heart failure ([place]) 09/04/2009  . UNSPECIFIED ANEMIA 05/14/2009  . PAF (paroxysmal atrial fibrillation) ([place]) 01/31/2009    Palliative Care Assessment & Plan    Recommendations/Plan:  MOST form in chart.  Will continue to follow.   Recommend palliative to follow outpatient as he wishes to continue Milrinone outpatient.    Code Status:    Code Status Orders  (From admission, onward)         Start     Ordered   11/23/18 1118  Limited resuscitation (code)  Continuous    Question Answer Comment  In the event of cardiac or respiratory ARREST: Initiate Code Blue, Call Rapid Response Yes   In the event of cardiac or respiratory ARREST: Perform CPR Yes   In the event of cardiac or respiratory ARREST: Perform Intubation/Mechanical Ventilation No   In the event of cardiac or respiratory ARREST: Use NIPPV/BiPAp only if indicated Yes   In the event of cardiac or respiratory ARREST: Administer ACLS medications if indicated Yes   In the event of cardiac or respiratory ARREST: Perform Defibrillation or Cardioversion if indicated Yes      11/23/18 1117        Code Status History    Date Active Date Inactive Code Status Order ID Comments User Context   11/22/2018 1223 11/23/2018 1117 Full Code [id number]  Ilean China Inpatient   09/10/2012 2050 09/14/2012 1710 Full Code [id number]  Ruben Gottron, RN ED    Advance Directive Documentation     Most Recent Value  Type of Advance  Directive  Healthcare Power of Attorney, Living will  Pre-existing out of facility DNR order (yellow form or pink MOST form)  -  "MOST" Form in Place?  -       Prognosis:   Poor longterm.  Discharge Planning:  To Be Determined   Thank you for allowing the Palliative Medicine Team to assist in the care of this patient.   Time In: 8:20 Time Out: 10:30 Total Time 2 hours 10 min Prolonged Time Billed  yes       Greater than 50%  of this time was spent counseling and coordinating care related to the above assessment and plan.  Asencion Gowda, NP  Please contact Palliative Medicine Team phone at [phone] for questions and concerns.

## 2018-11-26 LAB — PROTIME-INR
INR: 2.06
Prothrombin Time: 22.9 seconds — ABNORMAL HIGH (ref 11.4–15.2)

## 2018-11-26 LAB — CBC
HCT: 32.4 % — ABNORMAL LOW (ref 39.0–52.0)
Hemoglobin: 10.8 g/dL — ABNORMAL LOW (ref 13.0–17.0)
MCH: 32.5 pg (ref 26.0–34.0)
MCHC: 33.3 g/dL (ref 30.0–36.0)
MCV: 97.6 fL (ref 80.0–100.0)
Platelets: 219 10*3/uL (ref 150–400)
RBC: 3.32 MIL/uL — AB (ref 4.22–5.81)
RDW: 14.4 % (ref 11.5–15.5)
WBC: 7 10*3/uL (ref 4.0–10.5)
nRBC: 0 % (ref 0.0–0.2)

## 2018-11-26 LAB — BASIC METABOLIC PANEL
Anion gap: 8 (ref 5–15)
BUN: 24 mg/dL — ABNORMAL HIGH (ref 8–23)
CHLORIDE: 93 mmol/L — AB (ref 98–111)
CO2: 28 mmol/L (ref 22–32)
Calcium: 8.7 mg/dL — ABNORMAL LOW (ref 8.9–10.3)
Creatinine, Ser: 1.44 mg/dL — ABNORMAL HIGH (ref 0.61–1.24)
GFR calc Af Amer: 55 mL/min — ABNORMAL LOW (ref >60)
GFR calc non Af Amer: 47 mL/min — ABNORMAL LOW (ref >60)
Glucose, Bld: 114 mg/dL — ABNORMAL HIGH (ref 70–99)
POTASSIUM: 4.2 mmol/L (ref 3.5–5.1)
Sodium: 129 mmol/L — ABNORMAL LOW (ref 135–145)

## 2018-11-26 LAB — COOXEMETRY PANEL
Carboxyhemoglobin: 1.6 % — ABNORMAL HIGH (ref 0.5–1.5)
Methemoglobin: 1.5 % (ref 0.0–1.5)
O2 Saturation: 67.5 %
Total hemoglobin: 11.1 g/dL — ABNORMAL LOW (ref 12.0–16.0)

## 2018-11-26 MED ORDER — HEPARIN (PORCINE) 25000 UT/250ML-% IV SOLN: 1000.0000 [IU]/h | INTRAVENOUS | Status: DC

## 2018-11-26 NOTE — Progress Notes (Addendum)
ANTICOAGULATION CONSULT NOTE - Follow-Up Consult  Pharmacy Consult for warfarin>>heparin Indication: atrial fibrillation  Allergies  Allergen Reactions  . Bentyl [Dicyclomine Hcl] Other (See Comments)    Blisters in mouth, lips swelling    . Sulfa Antibiotics Shortness Of Breath  . Ace Inhibitors     Unknown  . Corzide [Nadolol-Bendroflumethiazide]     Unknown  . Diovan [Valsartan]     Unknown  . Flagyl [Metronidazole] Other (See Comments)    Unknown  . Omeprazole     Shut my kidneys down  . Other     Perfumes smells; paint smells; formaldehyde smells  . Shellfish Allergy   . Tape Other (See Comments)  . Amlodipine Besylate Other (See Comments)    Numbness to lips; made legs give out  . Codeine Itching and Rash    Itching  . Latex Itching and Rash  . Norvasc [Amlodipine Besylate] Other (See Comments)    Numbness to lips; made legs give out  . Penicillins Rash    Childhood reaction    Patient Measurements: Heparin Dosing Weight: 77.7 kg  Vital Signs: Temp: 98.4 F (36.9 C) (01/10 0426) Temp Source: Oral (01/10 0426) BP: 123/66 (01/10 0722) Pulse Rate: 78 (01/10 0722)  Labs: Recent Labs    11/24/18 0525 11/25/18 0414 11/26/18 0438  HGB 11.1* 11.2* 10.8*  HCT 33.3* 33.4* 32.4*  PLT 239 234 219  LABPROT 26.0* 25.4* 22.9*  INR 2.42 2.35 2.06  CREATININE 1.70* 1.55* 1.44*    Estimated Creatinine Clearance: 43.5 mL/min (A) (by C-G formula based on SCr of 1.44 mg/dL (H)).   Medical History: Past Medical History:  Diagnosis Date  . 6949-lead    replaced 02/2011  . Acute on chronic renal insufficiency   . AICD (automatic cardioverter/defibrillator) present    Medtronic CRT  . Asthma   . Atrial flutter ([place])    s/p TEE guided cardioversion  . CHF (congestive heart failure) ([place])   . Chronic drug-induced interstitial lung disorders ([place])   . COPD (chronic obstructive pulmonary disease) ([place])    " very MILD "  . Coronary artery disease    total  occlusion of the right coronary artery, left to right collaterals.  He had Cypher stenting to the circumflex in  July 2003  . Family hx of colon cancer    2 SISTERS  . Fatty liver    CT  . GERD (gastroesophageal reflux disease)   . Headache(784.0)   . Hyperlipemia   . Hypertension   . Hyponatremia   . Ischemic cardiomyopathy    Myoview 02/2011  EF 28%  Coronary artery disease  (total occlusion of the right coronary artery, left-to-right )  . LBBB (left bundle branch block)   . Nonsustained ventricular tachycardia ([place])   . Seizure disorder (HCC)     Medications:  Scheduled:  . allopurinol  100 mg Oral Daily  . digoxin  0.0625 mg Oral Daily  . fluticasone  1 spray Each Nare Daily  . furosemide  80 mg Oral Daily  . hydrALAZINE  25 mg Oral BID  . isosorbide mononitrate  30 mg Oral Daily  . montelukast  10 mg Oral QHS  . multivitamin with minerals  1 tablet Oral Daily  . pantoprazole  40 mg Oral Daily  . sodium chloride flush  10-40 mL Intracatheter Q12H  . spironolactone  25 mg Oral Daily  . vitamin C  500 mg Oral Daily    Assessment: 73 yoM admitted with low output HF  on warfarin PTA for PAF. INR therapeutic this am at 2.06, CBC stable, no Sx bleeding noted. Pharmacy to hold warfarin and start IV heparin once INR <2 in anticipation of cardiac cath. Discussed with MD - no need to recheck INR this afternoon, will reassess in am and start heparin if INR less than 2.0.   PTA regimen 5 mg daily except 7.5 mg on Saturday.   Goal of Therapy:  INR 2-3 Monitor platelets by anticoagulation protocol: Yes   Plan:  -Continue holding warfarin for now -Check 0500 INR - anticipate starting heparin tomorrow 1/11   Arrie Senate, PharmD, BCPS Clinical Pharmacist [phone] Please check AMION for all [facility] numbers 11/26/2018

## 2018-11-26 NOTE — Progress Notes (Addendum)
Advanced Heart Failure Rounding Note  PCP-Cardiologist: No primary care provider on file.   Subjective:    Admitted from [facility] clinic 11/22/2018 with concerns for low output with EF down to 15% from 35-40. CHF team consulted. Initial mixed venous saturation 50%. Started on milrinone 0.25 mcg/kg/min on 1/8  Coox 67.5% on milrinone 0.125 mcg/kg/min. Weight down 1 more lb on po lasix. CVP 4-5 cm.   Abd pain improved. Frustrated that INR remains elevated and may not be able to get cath today.   Coumadin on hold. INR 2.04.   Echo 11/19/2018 LVEF 15%, Mild AI, Mild MR, Severe LAE, RV mildly dilated, moderately reduced, Mod RAE,  (This is significantly decreased from Echo 12/12/2015 with EF 35-40% with mild RV dilation, normal function at that time)  Objective:   Weight Range: 73.3 kg Body mass index is 24.59 kg/m.   Vital Signs:   Temp:  [97.6 F (36.4 C)-98.6 F (37 C)] 98.4 F (36.9 C) (01/10 0426) Pulse Rate:  [78-92] 78 (01/10 0722) Resp:  [16-18] 18 (01/10 0426) BP: (103-123)/(40-80) 123/66 (01/10 0722) SpO2:  [95 %-98 %] 95 % (01/10 0722) Weight:  [73.3 kg] 73.3 kg (01/10 0429) Last BM Date: 11/23/18  Weight change: Filed Weights   11/24/18 0530 11/25/18 0500 11/26/18 0429  Weight: 74.3 kg 73.6 kg 73.3 kg   Intake/Output:   Intake/Output Summary (Last 24 hours) at 11/26/2018 0818 Last data filed at 11/26/2018 0705 Gross per 24 hour  Intake 430.77 ml  Output 875 ml  Net -444.23 ml    Physical Exam   General: Elderly. NAD  HEENT: Normal Neck: Supple. JVP not elevated. Carotids 2+ bilat; no bruits. No thyromegaly or nodule noted. Cor: PMI nondisplaced. Mildly irregular rate & rhythm, No M/G/R noted Lungs: CTAB, normal effort. Abdomen: Soft, non-tender, non-distended, no HSM. No bruits or masses. +BS  Extremities: No cyanosis, clubbing, or rash. + TED hose. RUE PICC.  Neuro: Alert & orientedx3, cranial nerves grossly intact. moves all 4 extremities w/o  difficulty. Affect pleasant   Telemetry   BiV paced 70-80s with occasional unpaced beats, Underlying Afib by ICD interrogation, personally reviewed.   EKG    No new tracings.    Labs    CBC Recent Labs    11/25/18 0414 11/26/18 0438  WBC 5.1 7.0  HGB 11.2* 10.8*  HCT 33.4* 32.4*  MCV 97.4 97.6  PLT 234 371   Basic Metabolic Panel Recent Labs    11/24/18 0525 11/25/18 0414 11/26/18 0438  NA 132* 130* 129*  K 3.4* 3.7 4.2  CL 95* 93* 93*  CO2 27 27 28   GLUCOSE 100* 109* 114*  BUN 33* 25* 24*  CREATININE 1.70* 1.55* 1.44*  CALCIUM 9.1 8.8* 8.7*  MG 1.9 2.0  --    Liver Function Tests No results for input(s): AST, ALT, ALKPHOS, BILITOT, PROT, ALBUMIN in the last 72 hours. No results for input(s): LIPASE, AMYLASE in the last 72 hours. Cardiac Enzymes No results for input(s): CKTOTAL, CKMB, CKMBINDEX, TROPONINI in the last 72 hours.  BNP: BNP (last 3 results) No results for input(s): BNP in the last 8760 hours.  ProBNP (last 3 results) Recent Labs    11/15/18 1001  PROBNP 1,353.0*     D-Dimer No results for input(s): DDIMER in the last 72 hours. Hemoglobin A1C No results for input(s): HGBA1C in the last 72 hours. Fasting Lipid Panel No results for input(s): CHOL, HDL, LDLCALC, TRIG, CHOLHDL, LDLDIRECT in the last 72 hours.  Thyroid Function Tests No results for input(s): TSH, T4TOTAL, T3FREE, THYROIDAB in the last 72 hours.  Invalid input(s): FREET3  Other results:   Imaging    No results found.   Medications:     Scheduled Medications: . allopurinol  100 mg Oral Daily  . digoxin  0.0625 mg Oral Daily  . fluticasone  1 spray Each Nare Daily  . furosemide  80 mg Oral Daily  . hydrALAZINE  25 mg Oral BID  . isosorbide mononitrate  30 mg Oral Daily  . montelukast  10 mg Oral QHS  . multivitamin with minerals  1 tablet Oral Daily  . pantoprazole  40 mg Oral Daily  . sodium chloride flush  10-40 mL Intracatheter Q12H  . spironolactone  25  mg Oral Daily  . vitamin C  500 mg Oral Daily    Infusions: . milrinone 0.125 mcg/kg/min (11/26/18 0705)  . ondansetron (ZOFRAN) IV      PRN Medications: acetaminophen, diphenhydrAMINE, loperamide, nitroGLYCERIN, ondansetron (ZOFRAN) IV, polyethylene glycol, sodium chloride, sodium chloride flush, traMADol    Patient Profile   [name] is a 73 y.o. male  with h/o CAD (s/p DES to LCx '03, residual total occlusion of RCA w/ collaterals, normal myoview 2012), ICM s/p Medtronic BiV ICD (upgrade in 2017), h/o Afib/flutter, HTN, HLD, CKD, and seizure disorder.   Admitted from [facility] clinic 11/22/2018 with concerns for low output CHF.   Assessment/Plan   1. Acute on chronic systolic CHF s/p BiV Medtronic ICD with low output - Echo as above with EF down to 15%, previously 35-40%.  - Co-ox and clinical status much improved with milrinone though suspect this am co-ox inacurrate (Initial coox 50%) - Coox 67.5% this am on milrinone 0.125 mcg/kg/min. Will stop and follow coox.  - Continue lasix 80 mg daily.  - Continue hydralazine 25 mg TID and Imdur 30 daily - Lisinopril stopped in 2018 due to AKI. Has ARBs listed as allergy. So no ACE/ARB/ARNI  - Continue spiro to 25 mg daily. (Had Ks in the 5.2 - 5.4 range recently as outpatient, so will need to follow closely) - Continue digoxin 0.0625 mg daily. Level stable at 0.3 11/23/2018 - No candidate for transplant and would not want LVAD. Says he would consider home milrinone if he needs it to maintain QOL   2. CAD s/p PCI - He has occasional non-specific left chest pain, and a pain in his mid back that may be due to volume overload vs anginal equivalent. Ideally, will need repeat L/RHC if Cr tolerates and pt agrees. No change. - CFX PCI in 2003, CTO of RCA with collaterals. - Myoview April 2012-no ischemia,inferolateral scar - Dr. Haroldine Laws planning R/L heart cath to see if ischemia responsible for dropping EF once INR 1.7 or less  - Holding  coumadin. INR 2.04. Per Dr. Haroldine Laws will start heparin am of 11/27/2018.  3. Permanent A-fib  - Chronic by ICD interrogation 11/23/2018 - BiV paced ~ 90% of the time by interrogation on admit.  - Holding coumadin for cath. Heparin when INR < 2.0. INR 2.04 today. Will start heparin tomorrow am per Dr. Haroldine Laws.  4. AKI on CKD III  - Baseline Unclear. Has ranged from 1.4 - 1.9 in the past several years.  - Likely due to cardiorenal syndrome  - Cr 1.86 -> 1.70 -> 1.55 -> 1.44 with diuresis and inotrope support.   5. Seizure Disorder - Reported in chart. This is distant. He has not had a  seizure since his 20-30s. He is not on medication. Reports having several "grand-mal" seizures with no clear etiology in his youth. No change.   6. Abdominal pain - Work up per pt report negative. CT of the abdomen and pelvis, abdominal ultrasound and CCK HIDA scan all done 10/2018 with no acute findings in the abdomen, no evidence of cholecystitis and noted stable pancreatic body cyst. - Sees [facility]. On Levsin for cramping/diarrhea.  - Improved with diuresis and inotrope support. Suspect mostly related to low output CHF.   6. Hypokalemia - K 4.2 this am.   7. Goals of Care - Remains partial code. He wishes to think about CPR more and discuss with his wife.  - Palliative Care team following for [place] - He does NOT want LVAD consideration  Medication concerns reviewed with patient and pharmacy team. Barriers identified: None at this time.   Length of Stay: 3  Annamaria Helling  11/26/2018, 8:18 AM  Advanced Heart Failure Team Pager [phone] (M-F; 7a - 4p)  Please contact [facility] for night-coverage after hours (4p -7a ) and weekends on [email or web address]  Patient seen and examined with the above-signed Advanced Practice Provider and/or Housestaff. I personally reviewed laboratory data, imaging studies and relevant notes. I independently examined the patient and formulated the important  aspects of the plan. I have edited the note to reflect any of my changes or salient points. I have personally discussed the plan with the patient and/or family.  Remains on milrinone 0.125. Co-ox 67% Volume status looks good. Will stop milrinone and follow. INR 2.0 this am. Will likely need heparin tomorrow. Plan R/L cath on Monday which will be good to do this off milrinone for several days. Creatinine much improved with inotropic support.   Glori Bickers, MD  10:27 AM

## 2018-11-26 NOTE — Progress Notes (Signed)
Daily Progress Note   Patient Name: [name]       Date: 11/26/2018 DOB: 09-14-1946  Age: 73 y.o. MRN#: [id number] Attending Physician: Larey Dresser, MD Primary Care Physician: Nona Dell, Corene Cornea, MD Admit Date: 11/22/2018  Reason for Consultation/Follow-up: Establishing goals of care For [place] conversation, please see progress note from 1/9.  Subjective: Notified by nursing family has questions. Wife is at bedside. All questions that could be answered by palliative were answered; several were cardiology based. We discussed again the difference in concepts between palliative and hospice, and that while he is using Milrinone, he will be followed by palliative care. He is concerned about quality of life and functionality at home with the Milrinone. He is worried about the dying process and suffering at end of life. We discussed hospice and their role of relieving pain and suffering at end of life.    MOST form signed by patient in chart. Cardiopulmonary Resuscitation: Do Not Attempt Resuscitation (DNR/No CPR)  Medical Interventions: Full Scope of Treatment: Use intubation, advanced airway interventions, mechanical ventilation, cardioversion as indicated, medical treatment, IV fluids, etc, also provide comfort measures. Transfer to the hospital if indicated  Antibiotics: Antibiotics if indicated  IV Fluids: IV fluids if indicated  Feeding Tube: No feeding tube     Length of Stay: 3  Current Medications: Scheduled Meds:  . allopurinol  100 mg Oral Daily  . digoxin  0.0625 mg Oral Daily  . fluticasone  1 spray Each Nare Daily  . furosemide  80 mg Oral Daily  . hydrALAZINE  25 mg Oral BID  . isosorbide mononitrate  30 mg Oral Daily  . montelukast  10 mg Oral QHS  . multivitamin with  minerals  1 tablet Oral Daily  . pantoprazole  40 mg Oral Daily  . sodium chloride flush  10-40 mL Intracatheter Q12H  . spironolactone  25 mg Oral Daily  . vitamin C  500 mg Oral Daily    Continuous Infusions: . [START ON 11/27/2018] heparin    . ondansetron (ZOFRAN) IV      PRN Meds: acetaminophen, diphenhydrAMINE, loperamide, nitroGLYCERIN, ondansetron (ZOFRAN) IV, polyethylene glycol, sodium chloride, sodium chloride flush, traMADol  Physical Exam Constitutional:      Appearance: Normal appearance.  Pulmonary:     Effort: Pulmonary effort is normal.  Skin:    General: Skin is warm and dry.             Vital Signs: BP 123/66 (BP Location: Left Arm)   Pulse 78   Temp 98.4 F (36.9 C) (Oral)   Resp 18   Wt 73.3 kg   SpO2 95%   BMI 24.59 kg/m  SpO2: SpO2: 95 % O2 Device: O2 Device: Room Air O2 Flow Rate:    Intake/output summary:   Intake/Output Summary (Last 24 hours) at 11/26/2018 1054 Last data filed at 11/26/2018 4098 Gross per 24 hour  Intake 430.77 ml  Output 875 ml  Net -444.23 ml   LBM: Last BM Date: 11/23/18 Baseline Weight: Weight: 75.8 kg(scale c) Most recent weight: Weight: 73.3 kg       Palliative Assessment/Data: 40%    Flowsheet Rows     Most Recent Value  Intake Tab  Referral Department  Cardiology  Unit at Time of Referral  Cardiac/Telemetry Unit  Palliative Care Primary Diagnosis  Cardiac  Date Notified  11/23/18  Palliative Care Type  New Palliative care  Reason for referral  Clarify Goals of Care  Date of Admission  11/23/18  Date first seen by Palliative Care  11/24/18  # of days Palliative referral response time  1 Day(s)  # of days IP prior to Palliative referral  0  Clinical Assessment  Psychosocial & Spiritual Assessment  Palliative Care Outcomes      Patient Active Problem List   Diagnosis Date Noted  . Essential hypertension 12/14/2017  . CAD S/P percutaneous coronary angioplasty 05/27/2017  . CKD (chronic kidney  disease), stage III ([place]) 05/27/2017  . Encounter for therapeutic drug monitoring 02/22/2014  . NSVT (nonsustained ventricular tachycardia) ([place]) 09/19/2012  . Transaminitis 09/10/2012  . Encounter for long-term (current) use of anticoagulants 06/04/2011  . ICD BiV  medtronic   . Ischemic cardiomyopathy   . Acute on chronic systolic (congestive) heart failure ([place]) 09/04/2009  . UNSPECIFIED ANEMIA 05/14/2009  . PAF (paroxysmal atrial fibrillation) ([place]) 01/31/2009    Palliative Care Assessment & Plan    Recommendations/Plan:  MOST form in chart.  Will continue to follow.   Recommend palliative to follow outpatient as he wishes to continue Milrinone outpatient.    Code Status:    Code Status Orders  (From admission, onward)         Start     Ordered   11/23/18 1118  Limited resuscitation (code)  Continuous    Question Answer Comment  In the event of cardiac or respiratory ARREST: Initiate Code Blue, Call Rapid Response Yes   In the event of cardiac or respiratory ARREST: Perform CPR Yes   In the event of cardiac or respiratory ARREST: Perform Intubation/Mechanical Ventilation No   In the event of cardiac or respiratory ARREST: Use NIPPV/BiPAp only if indicated Yes   In the event of cardiac or respiratory ARREST: Administer ACLS medications if indicated Yes   In the event of cardiac or respiratory ARREST: Perform Defibrillation or Cardioversion if indicated Yes      11/23/18 1117        Code Status History    Date Active Date Inactive Code Status Order ID Comments User Context   11/22/2018 1223 11/23/2018 1117 Full Code [id number]  Ilean China Inpatient   09/10/2012 2050 09/14/2012 1710 Full Code [id number]  Ruben Gottron, RN ED    Advance Directive Documentation     Most Recent Value  Type of  Pension scheme manager Power of Attorney, Living will  Pre-existing out of facility DNR order (yellow form or pink MOST form)  -  "MOST" Form in Place?  -        Prognosis:   Poor longterm.  Discharge Planning:  To Be Determined   Thank you for allowing the Palliative Medicine Team to assist in the care of this patient.   Total Time 35 min Prolonged Time Billed  no      Greater than 50%  of this time was spent counseling and coordinating care related to the above assessment and plan.  Asencion Gowda, NP  Please contact Palliative Medicine Team phone at [phone] for questions and concerns.

## 2018-11-26 NOTE — Care Management Important Message (Signed)
Important Message  Patient Details  Name: [name] MRN: [id number] Date of Birth: 06-Jul-1946   Medicare Important Message Given:  Yes    Barb Merino Laylynn Campanella 11/26/2018, 11:54 AM

## 2018-11-27 LAB — PROTIME-INR
INR: 1.53
Prothrombin Time: 18.2 seconds — ABNORMAL HIGH (ref 11.4–15.2)

## 2018-11-27 LAB — COOXEMETRY PANEL
Carboxyhemoglobin: 1.9 % — ABNORMAL HIGH (ref 0.5–1.5)
METHEMOGLOBIN: 1.2 % (ref 0.0–1.5)
O2 Saturation: 70.4 %
Total hemoglobin: 11.3 g/dL — ABNORMAL LOW (ref 12.0–16.0)

## 2018-11-27 LAB — BASIC METABOLIC PANEL
Anion gap: 10 (ref 5–15)
BUN: 21 mg/dL (ref 8–23)
CO2: 27 mmol/L (ref 22–32)
CREATININE: 1.43 mg/dL — AB (ref 0.61–1.24)
Calcium: 8.7 mg/dL — ABNORMAL LOW (ref 8.9–10.3)
Chloride: 95 mmol/L — ABNORMAL LOW (ref 98–111)
GFR calc Af Amer: 56 mL/min — ABNORMAL LOW (ref >60)
GFR calc non Af Amer: 48 mL/min — ABNORMAL LOW (ref >60)
Glucose, Bld: 87 mg/dL (ref 70–99)
Potassium: 4 mmol/L (ref 3.5–5.1)
Sodium: 132 mmol/L — ABNORMAL LOW (ref 135–145)

## 2018-11-27 LAB — CBC
HCT: 33.8 % — ABNORMAL LOW (ref 39.0–52.0)
Hemoglobin: 10.9 g/dL — ABNORMAL LOW (ref 13.0–17.0)
MCH: 31.8 pg (ref 26.0–34.0)
MCHC: 32.2 g/dL (ref 30.0–36.0)
MCV: 98.5 fL (ref 80.0–100.0)
Platelets: 213 10*3/uL (ref 150–400)
RBC: 3.43 MIL/uL — ABNORMAL LOW (ref 4.22–5.81)
RDW: 14.6 % (ref 11.5–15.5)
WBC: 6.8 10*3/uL (ref 4.0–10.5)
nRBC: 0 % (ref 0.0–0.2)

## 2018-11-27 LAB — HEPARIN LEVEL (UNFRACTIONATED): Heparin Unfractionated: 0.13 IU/mL — ABNORMAL LOW (ref 0.30–0.70)

## 2018-11-27 MED ORDER — HEPARIN (PORCINE) 25000 UT/250ML-% IV SOLN
1300.0000 [IU]/h | INTRAVENOUS | Status: DC
  Administered 2018-11-27: 950 [IU]/h via INTRAVENOUS
  Administered 2018-11-28 – 2018-11-29 (×2): 1300 [IU]/h via INTRAVENOUS
  Filled 2018-11-27 (×3): qty 250

## 2018-11-27 NOTE — Progress Notes (Signed)
Advanced Heart Failure Rounding Note  PCP-Cardiologist: No primary care provider on file.   Subjective:    Admitted from [facility] clinic 11/22/2018 with concerns for low output with EF down to 15% from 35-40. CHF team consulted. Initial mixed venous saturation 50%. Started on milrinone 0.25 mcg/kg/min on 1/8  Milrinone stopped yesterday. Co-ox 70.4%. Creatinine stable at 1.4. Weight down another pound. Denies orthopnea or PND. Ab pain resovled    Coumadin on hold. INR 1.5 this am   Echo 11/19/2018 LVEF 15%, Mild AI, Mild MR, Severe LAE, RV mildly dilated, moderately reduced, Mod RAE,  (This is significantly decreased from Echo 12/12/2015 with EF 35-40% with mild RV dilation, normal function at that time)  Objective:   Weight Range: 72.7 kg Body mass index is 24.36 kg/m.   Vital Signs:   Temp:  [97.8 F (36.6 C)-98.7 F (37.1 C)] 98.7 F (37.1 C) (01/11 0512) Pulse Rate:  [82-97] 82 (01/11 0512) Resp:  [19] 19 (01/10 1939) BP: (106-125)/(68-81) 106/69 (01/11 0512) SpO2:  [98 %-100 %] 98 % (01/11 0512) Weight:  [72.7 kg] 72.7 kg (01/11 0514) Last BM Date: 11/26/18  Weight change: Filed Weights   11/25/18 0500 11/26/18 0429 11/27/18 0514  Weight: 73.6 kg 73.3 kg 72.7 kg   Intake/Output:   Intake/Output Summary (Last 24 hours) at 11/27/2018 0836 Last data filed at 11/27/2018 0530 Gross per 24 hour  Intake 1090 ml  Output 1425 ml  Net -335 ml    Physical Exam   General:  Well appearing. No resp difficulty HEENT: normal Neck: supple. no JVD. Carotids 2+ bilat; no bruits. No lymphadenopathy or thryomegaly appreciated. Cor: PMI laterally displaced. Irregular rate & rhythm. No rubs, gallops or murmurs. Lungs: clear Abdomen: soft, nontender, nondistended. No hepatosplenomegaly. No bruits or masses. Good bowel sounds. Extremities: no cyanosis, clubbing, rash, edema RUE PICC Neuro: alert & orientedx3, cranial nerves grossly intact. moves all 4 extremities w/o difficulty.  Affect pleasant   Telemetry   BiV paced 70-80s with frequent unpaced beats, Underlying Afib by ICD interrogation, personally reviewed.   EKG    No new tracings.    Labs    CBC Recent Labs    11/26/18 0438 11/27/18 0517  WBC 7.0 6.8  HGB 10.8* 10.9*  HCT 32.4* 33.8*  MCV 97.6 98.5  PLT 219 373   Basic Metabolic Panel Recent Labs    11/25/18 0414 11/26/18 0438 11/27/18 0517  NA 130* 129* 132*  K 3.7 4.2 4.0  CL 93* 93* 95*  CO2 27 28 27   GLUCOSE 428* 768* 87  BUN 25* 24* 21  CREATININE 1.55* 1.44* 1.43*  CALCIUM 8.8* 8.7* 8.7*  MG 2.0  --   --    Liver Function Tests No results for input(s): AST, ALT, ALKPHOS, BILITOT, PROT, ALBUMIN in the last 72 hours. No results for input(s): LIPASE, AMYLASE in the last 72 hours. Cardiac Enzymes No results for input(s): CKTOTAL, CKMB, CKMBINDEX, TROPONINI in the last 72 hours.  BNP: BNP (last 3 results) No results for input(s): BNP in the last 8760 hours.  ProBNP (last 3 results) Recent Labs    11/15/18 1001  PROBNP 1,353.0*     D-Dimer No results for input(s): DDIMER in the last 72 hours. Hemoglobin A1C No results for input(s): HGBA1C in the last 72 hours. Fasting Lipid Panel No results for input(s): CHOL, HDL, LDLCALC, TRIG, CHOLHDL, LDLDIRECT in the last 72 hours. Thyroid Function Tests No results for input(s): TSH, T4TOTAL, T3FREE, THYROIDAB in  the last 72 hours.  Invalid input(s): FREET3  Other results:   Imaging    No results found.   Medications:     Scheduled Medications: . allopurinol  100 mg Oral Daily  . digoxin  0.0625 mg Oral Daily  . fluticasone  1 spray Each Nare Daily  . furosemide  80 mg Oral Daily  . hydrALAZINE  25 mg Oral BID  . isosorbide mononitrate  30 mg Oral Daily  . montelukast  10 mg Oral QHS  . multivitamin with minerals  1 tablet Oral Daily  . pantoprazole  40 mg Oral Daily  . sodium chloride flush  10-40 mL Intracatheter Q12H  . spironolactone  25 mg Oral Daily    . vitamin C  500 mg Oral Daily    Infusions: . ondansetron (ZOFRAN) IV      PRN Medications: acetaminophen, diphenhydrAMINE, loperamide, nitroGLYCERIN, ondansetron (ZOFRAN) IV, polyethylene glycol, sodium chloride, sodium chloride flush, traMADol    Patient Profile   [name] is a 73 y.o. male  with h/o CAD (s/p DES to LCx '03, residual total occlusion of RCA w/ collaterals, normal myoview 2012), ICM s/p Medtronic BiV ICD (upgrade in 2017), h/o Afib/flutter, HTN, HLD, CKD, and seizure disorder.   Admitted from [facility] clinic 11/22/2018 with concerns for low output CHF.   Assessment/Plan   1. Acute on chronic systolic CHF s/p BiV Medtronic ICD with low output - Echo as above with EF down to 15%, previously 35-40%.  - Co-ox and clinical status much improved with milrinone (Initial coox 50%). Milrinone stopped 1/10 - Coox 70.4% this am off milrinone - Continue lasix 80 mg daily.  - Continue hydralazine 25 mg TID and Imdur 30 daily - Lisinopril stopped in 2018 due to AKI. Has ARBs listed as allergy. So no ACE/ARB/ARNI  - Continue spiro to 25 mg daily. (Had Ks in the 5.2 - 5.4 range recently as outpatient, so will need to follow closely). K 4.0 - Continue digoxin 0.0625 mg daily. Level stable at 0.3 11/23/2018 - No candidate for transplant and would not want LVAD. Says he would consider home milrinone if he needs it to maintain QOL   2. CAD s/p PCI - He has occasional non-specific left chest pain, and a pain in his mid back that may be due to volume overload vs anginal equivalent. Ideally, will need repeat L/RHC if Cr tolerates and pt agrees. No change. - CFX PCI in 2003, CTO of RCA with collaterals. - Myoview April 2012-no ischemia,inferolateral scar - Plan R/L heart cath on Monday to see if ischemia responsible for dropping EF. Coumadin on hold  - Start heparin today  3. Permanent A-fib  - Chronic by ICD interrogation 11/23/2018 - BiV paced ~ 90% of the time by interrogation  on admit.  - Holding coumadin for cath.  INR 1,5 today. Will start heparin. Discussed dosing with PharmD personally.  4. AKI on CKD III  - Baseline Unclear. Has ranged from 1.4 - 1.9 in the past several years.  - Likely due to cardiorenal syndrome  - Cr 1.86 -> 1.70 -> 1.55 -> 1.44 -> 1.43   5. Seizure Disorder - Reported in chart. This is distant. He has not had a seizure since his 20-30s. He is not on medication. Reports having several "grand-mal" seizures with no clear etiology in his youth. No change.   6. Abdominal pain - Work up per pt report negative. CT of the abdomen and pelvis, abdominal ultrasound and CCK HIDA  scan all done 10/2018 with no acute findings in the abdomen, no evidence of cholecystitis and noted stable pancreatic body cyst. - Sees [facility]. On Levsin for cramping/diarrhea.  - Improved with diuresis and inotrope support. Suspect mostly related to low output CHF.   6. Hypokalemia - K 4.0 this am.   7. Goals of Care - Remains partial code. He wishes to think about CPR more and discuss with his wife.  - Palliative Care team following for [place] - He does NOT want LVAD consideration. Would consider home milrinone if needed.   Medication concerns reviewed with patient and pharmacy team. Barriers identified: None at this time.   Length of Stay: [place], MD  11/27/2018, 8:36 AM  Advanced Heart Failure Team Pager [phone] (M-F; [place])  Please contact [facility] for night-coverage after hours (4p -7a ) and weekends on [email or web address]

## 2018-11-27 NOTE — Progress Notes (Signed)
ANTICOAGULATION CONSULT NOTE - Follow-Up Consult  Pharmacy Consult for warfarin>>heparin Indication: atrial fibrillation  Allergies  Allergen Reactions  . Bentyl [Dicyclomine Hcl] Other (See Comments)    Blisters in mouth, lips swelling    . Sulfa Antibiotics Shortness Of Breath  . Ace Inhibitors     Unknown  . Corzide [Nadolol-Bendroflumethiazide]     Unknown  . Diovan [Valsartan]     Unknown  . Flagyl [Metronidazole] Other (See Comments)    Unknown  . Omeprazole     Shut my kidneys down  . Other     Perfumes smells; paint smells; formaldehyde smells  . Shellfish Allergy   . Tape Other (See Comments)  . Amlodipine Besylate Other (See Comments)    Numbness to lips; made legs give out  . Codeine Itching and Rash    Itching  . Latex Itching and Rash  . Norvasc [Amlodipine Besylate] Other (See Comments)    Numbness to lips; made legs give out  . Penicillins Rash    Childhood reaction    Patient Measurements: Heparin Dosing Weight: 77.7 kg  Vital Signs: Temp: 98.7 F (37.1 C) (01/11 0512) Temp Source: Oral (01/11 0512) BP: 106/69 (01/11 0512) Pulse Rate: 82 (01/11 0512)  Labs: Recent Labs    11/25/18 0414 11/26/18 0438 11/27/18 0517  HGB 11.2* 10.8* 10.9*  HCT 33.4* 32.4* 33.8*  PLT 234 219 213  LABPROT 25.4* 22.9* 18.2*  INR 2.35 2.06 1.53  CREATININE 1.55* 1.44* 1.43*    Estimated Creatinine Clearance: 43.8 mL/min (A) (by C-G formula based on SCr of 1.43 mg/dL (H)).   Medical History: Past Medical History:  Diagnosis Date  . 6949-lead    replaced 02/2011  . Acute on chronic renal insufficiency   . AICD (automatic cardioverter/defibrillator) present    Medtronic CRT  . Asthma   . Atrial flutter ([place])    s/p TEE guided cardioversion  . CHF (congestive heart failure) ([place])   . Chronic drug-induced interstitial lung disorders ([place])   . COPD (chronic obstructive pulmonary disease) ([place])    " very MILD "  . Coronary artery disease    total  occlusion of the right coronary artery, left to right collaterals.  He had Cypher stenting to the circumflex in  July 2003  . Family hx of colon cancer    2 SISTERS  . Fatty liver    CT  . GERD (gastroesophageal reflux disease)   . Headache(784.0)   . Hyperlipemia   . Hypertension   . Hyponatremia   . Ischemic cardiomyopathy    Myoview 02/2011  EF 28%  Coronary artery disease  (total occlusion of the right coronary artery, left-to-right )  . LBBB (left bundle branch block)   . Nonsustained ventricular tachycardia ([place])   . Seizure disorder (HCC)     Medications:  Scheduled:  . allopurinol  100 mg Oral Daily  . digoxin  0.0625 mg Oral Daily  . fluticasone  1 spray Each Nare Daily  . furosemide  80 mg Oral Daily  . hydrALAZINE  25 mg Oral BID  . isosorbide mononitrate  30 mg Oral Daily  . montelukast  10 mg Oral QHS  . multivitamin with minerals  1 tablet Oral Daily  . pantoprazole  40 mg Oral Daily  . sodium chloride flush  10-40 mL Intracatheter Q12H  . spironolactone  25 mg Oral Daily  . vitamin C  500 mg Oral Daily    Assessment: 20 yoM admitted with low output HF  on warfarin PTA for PAF. Pharmacy consutled to hold warfarin and start IV heparin once INR <2 in anticipation of cardiac cath. PTA regimen 5 mg daily except 7.5 mg on Saturday.  1/11 AM: INR subtherapeutic this am at 1.53, CBC stable, no Sx bleeding noted. Will initiate heparin infusion without bolus given recent INR/warfarin use.    Goal of Therapy:  INR 2-3 Monitor platelets by anticoagulation protocol: Yes   Plan:  -Continue holding warfarin -No bolus -Start heparin infusion at 950 units/hr -Heparin level today at 1700 -Daily INR, CBC, heparin level  Thank you for involving pharmacy in this patient's care.  Janae Bridgeman, PharmD PGY1 Pharmacy Resident Phone: [phone] 11/27/2018 9:04 AM

## 2018-11-27 NOTE — Progress Notes (Signed)
ANTICOAGULATION CONSULT NOTE - Follow-Up Consult  Pharmacy Consult for warfarin>>heparin Indication: atrial fibrillation  Allergies  Allergen Reactions  . Bentyl [Dicyclomine Hcl] Other (See Comments)    Blisters in mouth, lips swelling    . Sulfa Antibiotics Shortness Of Breath  . Ace Inhibitors     Unknown  . Corzide [Nadolol-Bendroflumethiazide]     Unknown  . Diovan [Valsartan]     Unknown  . Flagyl [Metronidazole] Other (See Comments)    Unknown  . Omeprazole     Shut my kidneys down  . Other     Perfumes smells; paint smells; formaldehyde smells  . Shellfish Allergy   . Tape Other (See Comments)  . Amlodipine Besylate Other (See Comments)    Numbness to lips; made legs give out  . Codeine Itching and Rash    Itching  . Latex Itching and Rash  . Norvasc [Amlodipine Besylate] Other (See Comments)    Numbness to lips; made legs give out  . Penicillins Rash    Childhood reaction    Patient Measurements: Heparin Dosing Weight: 72.7 kg  Vital Signs: Temp: 98.1 F (36.7 C) (01/11 1312) Temp Source: Oral (01/11 1312) BP: 106/47 (01/11 1312) Pulse Rate: 79 (01/11 1312)  Labs: Recent Labs    11/25/18 0414 11/26/18 0438 11/27/18 0517 11/27/18 1745  HGB 11.2* 10.8* 10.9*  --   HCT 33.4* 32.4* 33.8*  --   PLT 234 219 213  --   LABPROT 25.4* 22.9* 18.2*  --   INR 2.35 2.06 1.53  --   HEPARINUNFRC  --   --   --  0.13*  CREATININE 1.55* 1.44* 1.43*  --     Estimated Creatinine Clearance: 43.8 mL/min (A) (by C-G formula based on SCr of 1.43 mg/dL (H)).   Medical History: Past Medical History:  Diagnosis Date  . 6949-lead    replaced 02/2011  . Acute on chronic renal insufficiency   . AICD (automatic cardioverter/defibrillator) present    Medtronic CRT  . Asthma   . Atrial flutter ([place])    s/p TEE guided cardioversion  . CHF (congestive heart failure) ([place])   . Chronic drug-induced interstitial lung disorders ([place])   . COPD (chronic obstructive  pulmonary disease) ([place])    " very MILD "  . Coronary artery disease    total occlusion of the right coronary artery, left to right collaterals.  He had Cypher stenting to the circumflex in  July 2003  . Family hx of colon cancer    2 SISTERS  . Fatty liver    CT  . GERD (gastroesophageal reflux disease)   . Headache(784.0)   . Hyperlipemia   . Hypertension   . Hyponatremia   . Ischemic cardiomyopathy    Myoview 02/2011  EF 28%  Coronary artery disease  (total occlusion of the right coronary artery, left-to-right )  . LBBB (left bundle branch block)   . Nonsustained ventricular tachycardia ([place])   . Seizure disorder (HCC)     Medications:  Scheduled:  . allopurinol  100 mg Oral Daily  . digoxin  0.0625 mg Oral Daily  . fluticasone  1 spray Each Nare Daily  . furosemide  80 mg Oral Daily  . hydrALAZINE  25 mg Oral BID  . isosorbide mononitrate  30 mg Oral Daily  . montelukast  10 mg Oral QHS  . multivitamin with minerals  1 tablet Oral Daily  . pantoprazole  40 mg Oral Daily  . sodium chloride flush  10-40 mL Intracatheter Q12H  . spironolactone  25 mg Oral Daily  . vitamin C  500 mg Oral Daily    Assessment: 51 yoM admitted with low output HF on warfarin PTA for PAF. Pharmacy consutled to hold warfarin and start IV heparin once INR <2 in anticipation of cardiac cath. PTA regimen 5 mg daily except 7.5 mg on Saturday.  INR subtherapeutic this am at 1.53, CBC stable, no Sx bleeding noted. Will initiate heparin infusion without bolus given recent INR/warfarin use.  Heparin level resulted low at 0.13. No bleeding or issues with infusion per discussion with RN.  Goal of Therapy:  INR 2-3 Monitor platelets by anticoagulation protocol: Yes   Plan:  -Continue holding warfarin -No bolus -Increase heparin infusion to 1150 units/hr -6h heparin level -Monitor daily INR, CBC, heparin level, s/sx bleeding  Elicia Lamp, PharmD, BCPS Please check AMION for all [place] contact  numbers Clinical Pharmacist 11/27/2018 6:46 PM

## 2018-11-28 DIAGNOSIS — Z7189 Other specified counseling: Secondary | ICD-10-CM

## 2018-11-28 DIAGNOSIS — Z515 Encounter for palliative care: Secondary | ICD-10-CM

## 2018-11-28 LAB — CBC
HCT: 33.9 % — ABNORMAL LOW (ref 39.0–52.0)
Hemoglobin: 10.9 g/dL — ABNORMAL LOW (ref 13.0–17.0)
MCH: 31.5 pg (ref 26.0–34.0)
MCHC: 32.2 g/dL (ref 30.0–36.0)
MCV: 98 fL (ref 80.0–100.0)
Platelets: 209 10*3/uL (ref 150–400)
RBC: 3.46 MIL/uL — AB (ref 4.22–5.81)
RDW: 14.6 % (ref 11.5–15.5)
WBC: 7.1 10*3/uL (ref 4.0–10.5)
nRBC: 0 % (ref 0.0–0.2)

## 2018-11-28 LAB — COOXEMETRY PANEL
Carboxyhemoglobin: 1.9 % — ABNORMAL HIGH (ref 0.5–1.5)
Methemoglobin: 1.1 % (ref 0.0–1.5)
O2 Saturation: 67.2 %
Total hemoglobin: 6.5 g/dL — CL (ref 12.0–16.0)

## 2018-11-28 LAB — HEPARIN LEVEL (UNFRACTIONATED)
Heparin Unfractionated: 0.29 IU/mL — ABNORMAL LOW (ref 0.30–0.70)
Heparin Unfractionated: 0.58 IU/mL (ref 0.30–0.70)

## 2018-11-28 LAB — BASIC METABOLIC PANEL
Anion gap: 10 (ref 5–15)
BUN: 21 mg/dL (ref 8–23)
CO2: 27 mmol/L (ref 22–32)
Calcium: 8.5 mg/dL — ABNORMAL LOW (ref 8.9–10.3)
Chloride: 93 mmol/L — ABNORMAL LOW (ref 98–111)
Creatinine, Ser: 1.53 mg/dL — ABNORMAL HIGH (ref 0.61–1.24)
GFR calc Af Amer: 51 mL/min — ABNORMAL LOW (ref >60)
GFR calc non Af Amer: 44 mL/min — ABNORMAL LOW (ref >60)
Glucose, Bld: 121 mg/dL — ABNORMAL HIGH (ref 70–99)
Potassium: 3.7 mmol/L (ref 3.5–5.1)
Sodium: 130 mmol/L — ABNORMAL LOW (ref 135–145)

## 2018-11-28 LAB — PROTIME-INR
INR: 1.59
Prothrombin Time: 18.7 seconds — ABNORMAL HIGH (ref 11.4–15.2)

## 2018-11-28 MED ORDER — POTASSIUM CHLORIDE CRYS ER 20 MEQ PO TBCR
40.0000 meq | EXTENDED_RELEASE_TABLET | Freq: Once | ORAL | Status: AC
  Administered 2018-11-28: 40 meq via ORAL
  Filled 2018-11-28: qty 2

## 2018-11-28 MED ORDER — SODIUM CHLORIDE 0.9 % IV SOLN
INTRAVENOUS | Status: DC
  Administered 2018-11-29: 04:00:00 via INTRAVENOUS

## 2018-11-28 MED ORDER — ASPIRIN 81 MG PO CHEW
81.0000 mg | CHEWABLE_TABLET | ORAL | Status: AC
  Administered 2018-11-29: 81 mg via ORAL
  Filled 2018-11-28: qty 1

## 2018-11-28 MED ORDER — SODIUM CHLORIDE 0.9 % IV SOLN: 250.0000 mL | INTRAVENOUS | Status: DC | PRN

## 2018-11-28 MED ORDER — SIMETHICONE 80 MG PO CHEW
80.0000 mg | CHEWABLE_TABLET | Freq: Four times a day (QID) | ORAL | Status: DC | PRN
  Administered 2018-11-28 – 2018-12-03 (×8): 80 mg via ORAL
  Filled 2018-11-28 (×7): qty 1

## 2018-11-28 MED ORDER — SODIUM CHLORIDE 0.9% FLUSH: 3.0000 mL | Freq: Two times a day (BID) | INTRAVENOUS | Status: DC

## 2018-11-28 MED ORDER — SODIUM CHLORIDE 0.9% FLUSH: 3.0000 mL | INTRAVENOUS | Status: DC | PRN

## 2018-11-28 NOTE — Progress Notes (Addendum)
Advanced Heart Failure Rounding Note  PCP-Cardiologist: No primary care provider on file.   Subjective:    Admitted from [facility] clinic 11/22/2018 with concerns for low output with EF down to 15% from 35-40. CHF team consulted. Initial mixed venous saturation 50%. Started on milrinone 0.25 mcg/kg/min on 1/8  Milrinone stopped 1/10. Co-ox 67.2% (but hgb off) . Creatinine stable at 1.4-1.5. Weight stable.  Continues to be very anxious. Saying he had palpitations last night and felt rough. No events on tele. Wife asking how we would turn ICD off if he wanted it off. Denies CP, orthopnea or PND. Ab pain resolved. Eating breakfast this am without problem. On heparin. No bleeding.    Echo 11/19/2018 LVEF 15%, Mild AI, Mild MR, Severe LAE, RV mildly dilated, moderately reduced, Mod RAE,  (This is significantly decreased from Echo 12/12/2015 with EF 35-40% with mild RV dilation, normal function at that time)  Objective:   Weight Range: 72.7 kg Body mass index is 24.36 kg/m.   Vital Signs:   Temp:  [97.9 F (36.6 C)-99 F (37.2 C)] 97.9 F (36.6 C) (01/12 0424) Pulse Rate:  [78-87] 87 (01/12 0424) Resp:  [13-17] 13 (01/12 0424) BP: (106-113)/(47-79) 113/79 (01/12 0424) SpO2:  [97 %-98 %] 97 % (01/12 0424) Weight:  [72.7 kg] 72.7 kg (01/12 0424) Last BM Date: 11/27/18  Weight change: Filed Weights   11/26/18 0429 11/27/18 0514 11/28/18 0424  Weight: 73.3 kg 72.7 kg 72.7 kg   Intake/Output:   Intake/Output Summary (Last 24 hours) at 11/28/2018 0820 Last data filed at 11/28/2018 0600 Gross per 24 hour  Intake 916.48 ml  Output 600 ml  Net 316.48 ml    Physical Exam   General:  Well appearing. No resp difficulty HEENT: normal Neck: supple. JVP 5-6  Carotids 2+ bilat; no bruits. No lymphadenopathy or thryomegaly appreciated. Cor: PMI laterally displaced. Mildly irregular rate & rhythm. No rubs, gallops or murmurs. Lungs: clear Abdomen: soft, nontender, nondistended. No  hepatosplenomegaly. No bruits or masses. Good bowel sounds. Extremities: no cyanosis, clubbing, rash, edema RUE piCC Neuro: alert & orientedx3, cranial nerves grossly intact. moves all 4 extremities w/o difficulty. Affect pleasant  Telemetry   BiV paced 80s with frequent unpaced beats, Underlying Afib by ICD interrogation, personally reviewed.   EKG    No new tracings.    Labs    CBC Recent Labs    11/27/18 0517 11/28/18 0203  WBC 6.8 7.1  HGB 10.9* 10.9*  HCT 33.8* 33.9*  MCV 98.5 98.0  PLT 213 277   Basic Metabolic Panel Recent Labs    11/27/18 0517 11/28/18 0203  NA 132* 130*  K 4.0 3.7  CL 95* 93*  CO2 27 27  GLUCOSE 87 121*  BUN 21 21  CREATININE 1.43* 1.53*  CALCIUM 8.7* 8.5*   Liver Function Tests No results for input(s): AST, ALT, ALKPHOS, BILITOT, PROT, ALBUMIN in the last 72 hours. No results for input(s): LIPASE, AMYLASE in the last 72 hours. Cardiac Enzymes No results for input(s): CKTOTAL, CKMB, CKMBINDEX, TROPONINI in the last 72 hours.  BNP: BNP (last 3 results) No results for input(s): BNP in the last 8760 hours.  ProBNP (last 3 results) Recent Labs    11/15/18 1001  PROBNP 1,353.0*     D-Dimer No results for input(s): DDIMER in the last 72 hours. Hemoglobin A1C No results for input(s): HGBA1C in the last 72 hours. Fasting Lipid Panel No results for input(s): CHOL, HDL, LDLCALC, TRIG, CHOLHDL,  LDLDIRECT in the last 72 hours. Thyroid Function Tests No results for input(s): TSH, T4TOTAL, T3FREE, THYROIDAB in the last 72 hours.  Invalid input(s): FREET3  Other results:   Imaging    No results found.   Medications:     Scheduled Medications: . allopurinol  100 mg Oral Daily  . digoxin  0.0625 mg Oral Daily  . fluticasone  1 spray Each Nare Daily  . furosemide  80 mg Oral Daily  . hydrALAZINE  25 mg Oral BID  . isosorbide mononitrate  30 mg Oral Daily  . montelukast  10 mg Oral QHS  . multivitamin with minerals  1  tablet Oral Daily  . pantoprazole  40 mg Oral Daily  . sodium chloride flush  10-40 mL Intracatheter Q12H  . spironolactone  25 mg Oral Daily  . vitamin C  500 mg Oral Daily    Infusions: . heparin 1,300 Units/hr (11/28/18 0600)  . ondansetron (ZOFRAN) IV      PRN Medications: acetaminophen, diphenhydrAMINE, loperamide, nitroGLYCERIN, ondansetron (ZOFRAN) IV, polyethylene glycol, sodium chloride, sodium chloride flush, traMADol    Patient Profile   [name] is a 73 y.o. male  with h/o CAD (s/p DES to LCx '03, residual total occlusion of RCA w/ collaterals, normal myoview 2012), ICM s/p Medtronic BiV ICD (upgrade in 2017), h/o Afib/flutter, HTN, HLD, CKD, and seizure disorder.   Admitted from [facility] clinic 11/22/2018 with concerns for low output CHF.   Assessment/Plan   1. Acute on chronic systolic CHF s/p BiV Medtronic ICD with low output - Echo as above with EF down to 15%, previously 35-40%.  - Co-ox and clinical status much improved with milrinone (Initial coox 50%). Milrinone stopped 1/10 - Continues to do well though he remians anxious. Coox 67% this am off milrinone - Continue lasix 80 mg daily.  - Continue hydralazine 25 mg TID and Imdur 30 daily - Lisinopril stopped in 2018 due to AKI. Has ARBs listed as allergy. So no ACE/ARB/ARNI  - Continue spiro to 25 mg daily. (Had Ks in the 5.2 - 5.4 range recently as outpatient, so will need to follow closely). K 4.0 - Continue digoxin 0.0625 mg daily. Level stable at 0.3 11/23/2018 - No candidate for transplant and would not want LVAD. Says he would consider home milrinone if he needs it to maintain QOL  - On tele has many non-paced beats and I think this has probably contributed to his worsening HF. I have asked EP to look at his tele today.   2. CAD s/p PCI - He has occasional non-specific left chest pain, and a pain in his mid back that may be due to volume overload vs anginal equivalent. Ideally, will need repeat L/RHC if  Cr tolerates and pt agrees. No change. - CFX PCI in 2003, CTO of RCA with collaterals. - Myoview April 2012-no ischemia,inferolateral scar - Plan R/L heart cath in am to see if ischemia responsible for dropping EF. Coumadin on hold  - On heparin without bleeding (hgb on co-ox low but ok on CBC). No clinical bleeding.   3. Permanent A-fib  - Chronic by ICD interrogation 11/23/2018 - BiV paced ~ 90% of the time by interrogation on admit.  - Holding coumadin for cath.  INR 1.59 today. On heparin. No bleeding. Discussed dosing with PharmD personally.  4. AKI on CKD III  - Baseline Unclear. Has ranged from 1.4 - 1.9 in the past several years.  - Likely due to cardiorenal syndrome  -  Cr 1.86 -> 1.70 -> 1.55 -> 1.44 -> 1.43-> 1.53  5. Seizure Disorder - Reported in chart. This is distant. He has not had a seizure since his 20-30s. He is not on medication. Reports having several "grand-mal" seizures with no clear etiology in his youth. No change.   6. Abdominal pain - Work up per pt report negative. CT of the abdomen and pelvis, abdominal ultrasound and CCK HIDA scan all done 10/2018 with no acute findings in the abdomen, no evidence of cholecystitis and noted stable pancreatic body cyst. - Sees [facility]. On Levsin for cramping/diarrhea.  - Improved with diuresis and inotrope support. Suspect mostly related to low output CHF.   6. Hypokalemia - K 3.7 this am. Will supp   7. Goals of Care - Remains partial code. He wishes to think about CPR more and discuss with his wife.  - Palliative Care team following for [place] - He does NOT want LVAD consideration. Would consider home milrinone if needed.  - If QOL decreases he would want ICD off  Medication concerns reviewed with patient and pharmacy team. Barriers identified: None at this time.   Length of Stay: Starr, MD  11/28/2018, 8:20 AM  Advanced Heart Failure Team Pager [phone] (M-F; 7a - 4p)  Please contact [facility] for night-coverage after hours (4p -7a ) and weekends on [email or web address]

## 2018-11-28 NOTE — Progress Notes (Signed)
ANTICOAGULATION CONSULT NOTE - Follow-Up Consult  Pharmacy Consult for warfarin>>heparin Indication: atrial fibrillation  Allergies  Allergen Reactions  . Bentyl [Dicyclomine Hcl] Other (See Comments)    Blisters in mouth, lips swelling    . Sulfa Antibiotics Shortness Of Breath  . Ace Inhibitors     Unknown  . Corzide [Nadolol-Bendroflumethiazide]     Unknown  . Diovan [Valsartan]     Unknown  . Flagyl [Metronidazole] Other (See Comments)    Unknown  . Omeprazole     Shut my kidneys down  . Other     Perfumes smells; paint smells; formaldehyde smells  . Shellfish Allergy   . Tape Other (See Comments)  . Amlodipine Besylate Other (See Comments)    Numbness to lips; made legs give out  . Codeine Itching and Rash    Itching  . Latex Itching and Rash  . Norvasc [Amlodipine Besylate] Other (See Comments)    Numbness to lips; made legs give out  . Penicillins Rash    Childhood reaction    Patient Measurements: Heparin Dosing Weight: 72.7 kg  Vital Signs: Temp: 99 F (37.2 C) (01/11 1937) Temp Source: Oral (01/11 1937) BP: 110/78 (01/11 1937) Pulse Rate: 78 (01/11 1937)  Labs: Recent Labs    11/25/18 0414 11/26/18 0438 11/27/18 0517 11/27/18 1745 11/28/18 0203  HGB 11.2* 10.8* 10.9*  --  10.9*  HCT 33.4* 32.4* 33.8*  --  33.9*  PLT 234 219 213  --  209  LABPROT 25.4* 22.9* 18.2*  --   --   INR 2.35 2.06 1.53  --   --   HEPARINUNFRC  --   --   --  0.13* 0.29*  CREATININE 1.55* 1.44* 1.43*  --  1.53*    Estimated Creatinine Clearance: 41 mL/min (A) (by C-G formula based on SCr of 1.53 mg/dL (H)).  Assessment: 37 yoM admitted with low output HF on warfarin PTA for PAF. Pharmacy consutled to hold warfarin and start IV heparin once INR <2 in anticipation of cardiac cath. PTA regimen 5 mg daily except 7.5 mg on Saturday.  Heparin level slightly subtherapeutic (0.29) on gtt at 1150 units/hr. No bleeding or issues with infusion per discussion with RN.  Goal of  Therapy:  INR 2-3, heparin level 0.3-0.7 units/ml Monitor platelets by anticoagulation protocol: Yes   Plan:  -Increase heparin infusion to 1300 units/hr -8h heparin level  Sherlon Handing, PharmD, BCPS Clinical pharmacist  **Pharmacist phone directory can now be found on [email or web address] ([email or web address]).  Listed under [place]. 11/28/2018 3:18 AM

## 2018-11-28 NOTE — Progress Notes (Signed)
Patient complaining of gas pains, would like to try something for gas relief, no PRN ordered. Cardiology paged with this information.

## 2018-11-28 NOTE — Progress Notes (Signed)
ANTICOAGULATION CONSULT NOTE - Follow-Up Consult  Pharmacy Consult for warfarin>>heparin Indication: atrial fibrillation  Allergies  Allergen Reactions  . Bentyl [Dicyclomine Hcl] Other (See Comments)    Blisters in mouth, lips swelling    . Sulfa Antibiotics Shortness Of Breath  . Ace Inhibitors     Unknown  . Corzide [Nadolol-Bendroflumethiazide]     Unknown  . Diovan [Valsartan]     Unknown  . Flagyl [Metronidazole] Other (See Comments)    Unknown  . Omeprazole     Shut my kidneys down  . Other     Perfumes smells; paint smells; formaldehyde smells  . Shellfish Allergy   . Tape Other (See Comments)  . Amlodipine Besylate Other (See Comments)    Numbness to lips; made legs give out  . Codeine Itching and Rash    Itching  . Latex Itching and Rash  . Norvasc [Amlodipine Besylate] Other (See Comments)    Numbness to lips; made legs give out  . Penicillins Rash    Childhood reaction    Patient Measurements: Heparin Dosing Weight: 72.7 kg  Vital Signs: Temp: 97.9 F (36.6 C) (01/12 0424) Temp Source: Oral (01/12 0424) BP: 113/79 (01/12 0424) Pulse Rate: 87 (01/12 0424)  Labs: Recent Labs    11/26/18 0438 11/27/18 0517 11/27/18 1745 11/28/18 0203 11/28/18 1213  HGB 10.8* 10.9*  --  10.9*  --   HCT 32.4* 33.8*  --  33.9*  --   PLT 219 213  --  209  --   LABPROT 22.9* 18.2*  --  18.7*  --   INR 2.06 1.53  --  1.59  --   HEPARINUNFRC  --   --  0.13* 0.29* 0.58  CREATININE 1.44* 1.43*  --  1.53*  --     Estimated Creatinine Clearance: 41 mL/min (A) (by C-G formula based on SCr of 1.53 mg/dL (H)).  Assessment: 66 yoM admitted with low output HF on warfarin PTA for PAF. Pharmacy consutled to hold warfarin and start IV heparin once INR <2 in anticipation of cardiac cath. PTA regimen 5 mg daily except 7.5 mg on Saturday.  Heparin level now therapeutic (0.58) after rate increase. CBC stable. No bleeding issues documented.  Goal of Therapy:  heparin level  0.3-0.7 units/ml Monitor platelets by anticoagulation protocol: Yes   Plan:  -Continue heparin infusion at 1300 units/hr -Monitor daily heparin level and CBC, s/sx bleeding  Elicia Lamp, PharmD, BCPS Please check AMION for all [place] contact numbers Clinical Pharmacist 11/28/2018 12:56 PM

## 2018-11-28 NOTE — Progress Notes (Signed)
Telemetry reviewed for Dr Haroldine Laws. The patient does have occasional PVCs which may be affecting BiV pacing, though I am not convinced that this is contributing significantly to his decline. Device interrogation from 09/16/18 is personally reviewed and reveals at that time BiV pacing of 91%.  We will ask MDT team to interrogate device tomorrow am for an update.  Probably unable to add/ titrate beta blocker therapy in setting of acute heart failure, but would add when able.    I will ask Dr Caryl Comes to see in am to see if he thinks that AAD would provide benefit.  The patient is not a candidate for ablation currently.  Of note, there have been discussions of end of life and how his defibrillator would impact this.  Dr Caryl Comes can address this also has he has known the patient for a while.  Thompson Grayer MD, [facility] 11/28/2018 11:45 AM

## 2018-11-29 ENCOUNTER — Encounter (HOSPITAL_COMMUNITY): Payer: Self-pay | Admitting: Internal Medicine

## 2018-11-29 ENCOUNTER — Inpatient Hospital Stay (HOSPITAL_COMMUNITY)
Admission: AD | Disposition: A | Discharge status: Home Health | Payer: Self-pay | Source: Ambulatory Visit | Attending: Cardiology

## 2018-11-29 DIAGNOSIS — I2510 Atherosclerotic heart disease of native coronary artery without angina pectoris: Secondary | ICD-10-CM

## 2018-11-29 DIAGNOSIS — I4821 Permanent atrial fibrillation: Secondary | ICD-10-CM

## 2018-11-29 HISTORY — PX: RIGHT/LEFT HEART CATH AND CORONARY ANGIOGRAPHY: CATH118266

## 2018-11-29 LAB — POCT I-STAT 3, VENOUS BLOOD GAS (G3P V)
Acid-Base Excess: 3 mmol/L — ABNORMAL HIGH (ref 0.0–2.0)
Acid-Base Excess: 3 mmol/L — ABNORMAL HIGH (ref 0.0–2.0)
Bicarbonate: 28.3 mmol/L — ABNORMAL HIGH (ref 20.0–28.0)
Bicarbonate: 28.4 mmol/L — ABNORMAL HIGH (ref 20.0–28.0)
O2 SAT: 62 %
O2 Saturation: 56 %
TCO2: 30 mmol/L (ref 22–32)
TCO2: 30 mmol/L (ref 22–32)
pCO2, Ven: 44 mmHg (ref 44.0–60.0)
pCO2, Ven: 45 mmHg (ref 44.0–60.0)
pH, Ven: 7.408 (ref 7.250–7.430)
pH, Ven: 7.417 (ref 7.250–7.430)
pO2, Ven: 29 mmHg — CL (ref 32.0–45.0)
pO2, Ven: 32 mmHg (ref 32.0–45.0)

## 2018-11-29 LAB — BASIC METABOLIC PANEL
Anion gap: 8 (ref 5–15)
BUN: 23 mg/dL (ref 8–23)
CO2: 26 mmol/L (ref 22–32)
Calcium: 8.4 mg/dL — ABNORMAL LOW (ref 8.9–10.3)
Chloride: 97 mmol/L — ABNORMAL LOW (ref 98–111)
Creatinine, Ser: 1.52 mg/dL — ABNORMAL HIGH (ref 0.61–1.24)
GFR calc non Af Amer: 44 mL/min — ABNORMAL LOW (ref >60)
GFR, EST AFRICAN AMERICAN: 52 mL/min — AB (ref >60)
Glucose, Bld: 117 mg/dL — ABNORMAL HIGH (ref 70–99)
POTASSIUM: 3.9 mmol/L (ref 3.5–5.1)
Sodium: 131 mmol/L — ABNORMAL LOW (ref 135–145)

## 2018-11-29 LAB — POCT I-STAT 3, ART BLOOD GAS (G3+)
Acid-Base Excess: 1 mmol/L (ref 0.0–2.0)
Bicarbonate: 26.1 mmol/L (ref 20.0–28.0)
O2 SAT: 99 %
TCO2: 27 mmol/L (ref 22–32)
pCO2 arterial: 40.4 mmHg (ref 32.0–48.0)
pH, Arterial: 7.418 (ref 7.350–7.450)
pO2, Arterial: 126 mmHg — ABNORMAL HIGH (ref 83.0–108.0)

## 2018-11-29 LAB — CBC
HCT: 33.2 % — ABNORMAL LOW (ref 39.0–52.0)
Hemoglobin: 10.8 g/dL — ABNORMAL LOW (ref 13.0–17.0)
MCH: 31.5 pg (ref 26.0–34.0)
MCHC: 32.5 g/dL (ref 30.0–36.0)
MCV: 96.8 fL (ref 80.0–100.0)
NRBC: 0 % (ref 0.0–0.2)
Platelets: 206 10*3/uL (ref 150–400)
RBC: 3.43 MIL/uL — AB (ref 4.22–5.81)
RDW: 14.6 % (ref 11.5–15.5)
WBC: 7.5 10*3/uL (ref 4.0–10.5)

## 2018-11-29 LAB — COOXEMETRY PANEL
Carboxyhemoglobin: 1.6 % — ABNORMAL HIGH (ref 0.5–1.5)
Methemoglobin: 0.9 % (ref 0.0–1.5)
O2 Saturation: 64.6 %
Total hemoglobin: 11.4 g/dL — ABNORMAL LOW (ref 12.0–16.0)

## 2018-11-29 LAB — PROTIME-INR
INR: 1.38
Prothrombin Time: 16.8 seconds — ABNORMAL HIGH (ref 11.4–15.2)

## 2018-11-29 LAB — HEPARIN LEVEL (UNFRACTIONATED): Heparin Unfractionated: 0.53 IU/mL (ref 0.30–0.70)

## 2018-11-29 LAB — POCT ACTIVATED CLOTTING TIME: Activated Clotting Time: 186 seconds

## 2018-11-29 SURGERY — RIGHT/LEFT HEART CATH AND CORONARY ANGIOGRAPHY
Anesthesia: LOCAL

## 2018-11-29 MED ORDER — APIXABAN 5 MG PO TABS
5.0000 mg | ORAL_TABLET | Freq: Two times a day (BID) | ORAL | Status: DC
  Administered 2018-11-29 – 2018-12-01 (×4): 5 mg via ORAL
  Filled 2018-11-29 (×4): qty 1

## 2018-11-29 MED ORDER — DIPHENHYDRAMINE HCL 50 MG/ML IJ SOLN
INTRAMUSCULAR | Status: DC | PRN
  Administered 2018-11-29: 25 mg via INTRAVENOUS

## 2018-11-29 MED ORDER — SODIUM CHLORIDE 0.9% FLUSH
3.0000 mL | Freq: Two times a day (BID) | INTRAVENOUS | Status: DC
  Administered 2018-11-29 – 2018-12-03 (×3): 3 mL via INTRAVENOUS

## 2018-11-29 MED ORDER — SODIUM CHLORIDE 0.9 % IV SOLN
250.0000 mL | INTRAVENOUS | Status: DC | PRN
  Administered 2018-12-01 – 2018-12-03 (×3): 250 mL via INTRAVENOUS

## 2018-11-29 MED ORDER — VERAPAMIL HCL 2.5 MG/ML IV SOLN
INTRAVENOUS | Status: AC
  Filled 2018-11-29: qty 2

## 2018-11-29 MED ORDER — VERAPAMIL HCL 2.5 MG/ML IV SOLN
INTRAVENOUS | Status: DC | PRN
  Administered 2018-11-29: 10 mL via INTRA_ARTERIAL

## 2018-11-29 MED ORDER — GUAIFENESIN-DM 100-10 MG/5ML PO SYRP
15.0000 mL | ORAL_SOLUTION | ORAL | Status: DC | PRN
  Administered 2018-11-29 – 2018-12-01 (×2): 15 mL via ORAL
  Filled 2018-11-29 (×3): qty 15

## 2018-11-29 MED ORDER — DIPHENHYDRAMINE HCL 50 MG/ML IJ SOLN
INTRAMUSCULAR | Status: AC
  Filled 2018-11-29: qty 1

## 2018-11-29 MED ORDER — HEPARIN SODIUM (PORCINE) 1000 UNIT/ML IJ SOLN
INTRAMUSCULAR | Status: DC | PRN
  Administered 2018-11-29: 3000 [IU] via INTRAVENOUS

## 2018-11-29 MED ORDER — LIDOCAINE HCL (PF) 1 % IJ SOLN
INTRAMUSCULAR | Status: DC | PRN
  Administered 2018-11-29: 20 mL
  Administered 2018-11-29: 5 mL

## 2018-11-29 MED ORDER — FUROSEMIDE 80 MG PO TABS: 80.0000 mg | ORAL_TABLET | Freq: Every day | ORAL | Status: DC

## 2018-11-29 MED ORDER — HEPARIN (PORCINE) IN NACL 1000-0.9 UT/500ML-% IV SOLN
INTRAVENOUS | Status: AC
  Filled 2018-11-29: qty 500

## 2018-11-29 MED ORDER — ACETAMINOPHEN 325 MG PO TABS: 650.0000 mg | ORAL_TABLET | ORAL | Status: DC | PRN

## 2018-11-29 MED ORDER — AMIODARONE HCL 200 MG PO TABS
400.0000 mg | ORAL_TABLET | Freq: Three times a day (TID) | ORAL | Status: DC
  Administered 2018-11-29 – 2018-12-03 (×13): 400 mg via ORAL
  Filled 2018-11-29 (×13): qty 2

## 2018-11-29 MED ORDER — DIAZEPAM 5 MG PO TABS
2.5000 mg | ORAL_TABLET | Freq: Once | ORAL | Status: AC
  Administered 2018-11-29: 2.5 mg via ORAL
  Filled 2018-11-29: qty 1

## 2018-11-29 MED ORDER — SODIUM CHLORIDE 0.9% FLUSH: 3.0000 mL | INTRAVENOUS | Status: DC | PRN

## 2018-11-29 MED ORDER — HEPARIN (PORCINE) IN NACL 1000-0.9 UT/500ML-% IV SOLN
INTRAVENOUS | Status: AC
  Filled 2018-11-29: qty 1000

## 2018-11-29 MED ORDER — ONDANSETRON HCL 4 MG/2ML IJ SOLN: 4.0000 mg | Freq: Four times a day (QID) | INTRAMUSCULAR | Status: DC | PRN

## 2018-11-29 MED ORDER — HEPARIN (PORCINE) IN NACL 1000-0.9 UT/500ML-% IV SOLN
INTRAVENOUS | Status: DC | PRN
  Administered 2018-11-29 (×2): 500 mL

## 2018-11-29 MED ORDER — IOHEXOL 350 MG/ML SOLN
INTRAVENOUS | Status: DC | PRN
  Administered 2018-11-29: 40 mL via INTRA_ARTERIAL

## 2018-11-29 MED ORDER — LIDOCAINE HCL (PF) 1 % IJ SOLN
INTRAMUSCULAR | Status: AC
  Filled 2018-11-29: qty 30

## 2018-11-29 SURGICAL SUPPLY — 12 items
CATH 5FR JL3.5 JR4 ANG PIG MP (CATHETERS) ×2 IMPLANT
CATH SWAN GANZ 7F STRAIGHT (CATHETERS) ×2 IMPLANT
DEVICE RAD COMP TR BAND LRG (VASCULAR PRODUCTS) ×2 IMPLANT
GLIDESHEATH SLEND SS 6F .021 (SHEATH) ×2 IMPLANT
GUIDEWIRE .025 260CM (WIRE) ×2 IMPLANT
GUIDEWIRE INQWIRE 1.5J.035X260 (WIRE) ×1 IMPLANT
INQWIRE 1.5J .035X260CM (WIRE) ×2
KIT HEART LEFT (KITS) ×2 IMPLANT
PACK CARDIAC CATHETERIZATION (CUSTOM PROCEDURE TRAY) ×2 IMPLANT
SHEATH PINNACLE 7F 10CM (SHEATH) ×2 IMPLANT
TRANSDUCER W/STOPCOCK (MISCELLANEOUS) ×2 IMPLANT
TUBING CIL FLEX 10 FLL-RA (TUBING) ×2 IMPLANT

## 2018-11-29 NOTE — Progress Notes (Signed)
ANTICOAGULATION CONSULT NOTE - [place] for warfarin>>heparin > apixaban Indication: atrial fibrillation  Allergies  Allergen Reactions  . Bentyl [Dicyclomine Hcl] Other (See Comments)    Blisters in mouth, lips swelling    . Sulfa Antibiotics Shortness Of Breath  . Ace Inhibitors     Unknown  . Corzide [Nadolol-Bendroflumethiazide]     Unknown  . Diovan [Valsartan]     Unknown  . Flagyl [Metronidazole] Other (See Comments)    Unknown  . Omeprazole     Shut my kidneys down  . Other     Perfumes smells; paint smells; formaldehyde smells  . Shellfish Allergy   . Tape Other (See Comments)  . Amlodipine Besylate Other (See Comments)    Numbness to lips; made legs give out  . Codeine Itching and Rash    Itching  . Latex Itching and Rash  . Norvasc [Amlodipine Besylate] Other (See Comments)    Numbness to lips; made legs give out  . Penicillins Rash    Childhood reaction    Patient Measurements: Heparin Dosing Weight: 72.7 kg  Vital Signs: Temp: 98.1 F (36.7 C) (01/13 0346) Temp Source: Oral (01/13 0346) BP: 123/77 (01/13 1023) Pulse Rate: 82 (01/13 1023)  Labs: Recent Labs    11/27/18 0517  11/28/18 0203 11/28/18 1213 11/29/18 0337  HGB 10.9*  --  10.9*  --  10.8*  HCT 33.8*  --  33.9*  --  33.2*  PLT 213  --  209  --  206  LABPROT 18.2*  --  18.7*  --  16.8*  INR 1.53  --  1.59  --  1.38  HEPARINUNFRC  --    < > 0.29* 0.58 0.53  CREATININE 1.43*  --  1.53*  --  1.52*   < > = values in this interval not displayed.    Estimated Creatinine Clearance: 41.6 mL/min (A) (by C-G formula based on SCr of 1.52 mg/dL (H)).  Assessment: 66 yoM admitted with low output HF on warfarin PTA for PAF. Pharmacy consutled to hold warfarin and start IV heparin once INR <2 in anticipation of cardiac cath. PTA regimen 5 mg daily except 7.5 mg on Saturday.  INR 1.3, heparin off for cath plan to restart anticoagulation tonight.  Spoke to patient  -  agreeable to apixaban - will start tonight Will consult case management for copay amount  Cr 1.5, wt 72kg, age < 80  Goal of Therapy:   Monitor platelets by anticoagulation protocol: Yes   Plan:  Stop Heparin and warfarin Begin apixaban 5mg  BID Monitor s/s bleeding  Bonnita Nasuti Pharm.D. CPP, BCPS Clinical Pharmacist [phone] 11/29/2018 11:41 AM

## 2018-11-29 NOTE — Progress Notes (Addendum)
Site area: Right groin a 7 french venous sheath was removed  Site Prior to Removal:  Level 0  Pressure Applied For 15 MINUTES    Bedrest Beginning at 0900am  Manual:   Yes.    Patient Status During Pull:  stable  Post Pull Groin Site:  Level 0  Post Pull Instructions Given:  Yes.    Post Pull Pulses Present:  Yes.    Dressing Applied:  Yes.    Comments:  Vs remain stable

## 2018-11-29 NOTE — Plan of Care (Signed)
  Problem: Education: Goal: Ability to demonstrate management of disease process will improve Outcome: Progressing   Problem: Activity: Goal: Capacity to carry out activities will improve Outcome: Progressing   

## 2018-11-29 NOTE — Progress Notes (Signed)
Advanced Heart Failure Rounding Note  PCP-Cardiologist: No primary care provider on file.   Subjective:    Admitted from [facility] clinic 11/22/2018 with concerns for low output with EF down to 15% from 35-40. CHF team consulted. Initial mixed venous saturation 50%. Started on milrinone 0.25 mcg/kg/min on 1/8  Milrinone stopped 1/10. Co-ox 65%. Feels ok. Nervous about cath. No CP or SOB. No orthopnea or PND. EP has seen regarding PVCs. Dr. Caryl Comes to see formally today. On hepairn. No bleeding.    Echo 11/19/2018 LVEF 15%, Mild AI, Mild MR, Severe LAE, RV mildly dilated, moderately reduced, Mod RAE,  (This is significantly decreased from Echo 12/12/2015 with EF 35-40% with mild RV dilation, normal function at that time)  Objective:   Weight Range: 72.3 kg Body mass index is 24.07 kg/m.   Vital Signs:   Temp:  [97.8 F (36.6 C)-98.2 F (36.8 C)] 98.1 F (36.7 C) (01/13 0346) Pulse Rate:  [79-104] 79 (01/13 0346) Resp:  [15-18] 18 (01/13 0346) BP: (111-116)/(71-89) 111/71 (01/13 0346) SpO2:  [97 %-100 %] 100 % (01/13 0739) Weight:  [72.3 kg] 72.3 kg (01/13 0347) Last BM Date: 11/28/18  Weight change: Filed Weights   11/27/18 0514 11/28/18 0424 11/29/18 0347  Weight: 72.7 kg 72.7 kg 72.3 kg   Intake/Output:   Intake/Output Summary (Last 24 hours) at 11/29/2018 0752 Last data filed at 11/29/2018 5462 Gross per 24 hour  Intake 1435.66 ml  Output 1925 ml  Net -489.34 ml    Physical Exam   General:  Well appearing. No resp difficulty HEENT: normal Neck: supple. no JVD. Carotids 2+ bilat; no bruits. No lymphadenopathy or thryomegaly appreciated. Cor: PMI nondisplaced. Regular rate & rhythm. No rubs, gallops or murmurs. Lungs: clear Abdomen: soft, nontender, nondistended. No hepatosplenomegaly. No bruits or masses. Good bowel sounds. Extremities: no cyanosis, clubbing, rash, edema RUE PICC Neuro: alert & orientedx3, cranial nerves grossly intact. moves all 4 extremities w/o  difficulty. Affect pleasant  Telemetry   BiV paced 80s with frequent unpaced beats/PVCs, Underlying Afib by ICD interrogation, personally reviewed.   EKG    No new tracings.    Labs    CBC Recent Labs    11/28/18 0203 11/29/18 0337  WBC 7.1 7.5  HGB 10.9* 10.8*  HCT 33.9* 33.2*  MCV 98.0 96.8  PLT 209 703   Basic Metabolic Panel Recent Labs    11/28/18 0203 11/29/18 0337  NA 130* 131*  K 3.7 3.9  CL 93* 97*  CO2 27 26  GLUCOSE 121* 117*  BUN 21 23  CREATININE 1.53* 1.52*  CALCIUM 8.5* 8.4*   Liver Function Tests No results for input(s): AST, ALT, ALKPHOS, BILITOT, PROT, ALBUMIN in the last 72 hours. No results for input(s): LIPASE, AMYLASE in the last 72 hours. Cardiac Enzymes No results for input(s): CKTOTAL, CKMB, CKMBINDEX, TROPONINI in the last 72 hours.  BNP: BNP (last 3 results) No results for input(s): BNP in the last 8760 hours.  ProBNP (last 3 results) Recent Labs    11/15/18 1001  PROBNP 1,353.0*     D-Dimer No results for input(s): DDIMER in the last 72 hours. Hemoglobin A1C No results for input(s): HGBA1C in the last 72 hours. Fasting Lipid Panel No results for input(s): CHOL, HDL, LDLCALC, TRIG, CHOLHDL, LDLDIRECT in the last 72 hours. Thyroid Function Tests No results for input(s): TSH, T4TOTAL, T3FREE, THYROIDAB in the last 72 hours.  Invalid input(s): FREET3  Other results:   Imaging  No results found.   Medications:     Scheduled Medications: . [MAR Hold] allopurinol  100 mg Oral Daily  . [MAR Hold] digoxin  0.0625 mg Oral Daily  . [MAR Hold] fluticasone  1 spray Each Nare Daily  . [MAR Hold] furosemide  80 mg Oral Daily  . [MAR Hold] hydrALAZINE  25 mg Oral BID  . [MAR Hold] isosorbide mononitrate  30 mg Oral Daily  . [MAR Hold] montelukast  10 mg Oral QHS  . [MAR Hold] multivitamin with minerals  1 tablet Oral Daily  . [MAR Hold] pantoprazole  40 mg Oral Daily  . [MAR Hold] sodium chloride flush  10-40 mL  Intracatheter Q12H  . sodium chloride flush  3 mL Intravenous Q12H  . [MAR Hold] spironolactone  25 mg Oral Daily  . [MAR Hold] vitamin C  500 mg Oral Daily    Infusions: . sodium chloride    . sodium chloride 10 mL/hr at 11/29/18 5638  . heparin Stopped (11/29/18 7564)  . [MAR Hold] ondansetron (ZOFRAN) IV      PRN Medications: sodium chloride, [MAR Hold] acetaminophen, [MAR Hold] diphenhydrAMINE, [MAR Hold] loperamide, [MAR Hold] nitroGLYCERIN, [MAR Hold] ondansetron (ZOFRAN) IV, [MAR Hold] polyethylene glycol, [MAR Hold] simethicone, [MAR Hold] sodium chloride, [MAR Hold] sodium chloride flush, sodium chloride flush, [MAR Hold] traMADol    Patient Profile   CABE LASHLEY is a 73 y.o. male  with h/o CAD (s/p DES to LCx '03, residual total occlusion of RCA w/ collaterals, normal myoview 2012), ICM s/p Medtronic BiV ICD (upgrade in 2017), h/o Afib/flutter, HTN, HLD, CKD, and seizure disorder.   Admitted from [facility] clinic 11/22/2018 with concerns for low output CHF.   Assessment/Plan   1. Acute on chronic systolic CHF s/p BiV Medtronic ICD with low output - Echo as above with EF down to 15%, previously 35-40%.  - Co-ox and clinical status much improved with milrinone (Initial coox 50%). Milrinone stopped 1/10 - Continues to do well though he remians anxious. Coox 65% this am off milrinone - Continue lasix 80 mg daily.  - Continue hydralazine 25 mg TID and Imdur 30 daily - Lisinopril stopped in 2018 due to AKI. Has ARBs listed as allergy. So no ACE/ARB/ARNI  - Continue spiro to 25 mg daily. (Had Ks in the 5.2 - 5.4 range recently as outpatient, so will need to follow closely). K 4.0 - Continue digoxin 0.0625 mg daily. Level stable at 0.3 11/23/2018 - No candidate for transplant and would not want LVAD. Says he would consider home milrinone if he needs it to maintain QOL  - On tele has many non-paced beats and I think this has probably contributed to his worsening HF. Dr. Caryl Comes to see  today.   2. CAD s/p PCI - He has occasional non-specific left chest pain, and a pain in his mid back that may be due to volume overload vs anginal equivalent. Ideally, will need repeat L/RHC if Cr tolerates and pt agrees. No change. - CFX PCI in 2003, CTO of RCA with collaterals. - Myoview April 2012-no ischemia,inferolateral scar - Plan R/L heart cath this am to see if ischemia responsible for dropping EF. Coumadin on hold  - On heparin without bleeding, Hgb stable  3. Permanent A-fib  - Chronic by ICD interrogation 11/23/2018 - BiV paced ~ 90% of the time by interrogation on admit.  - Holding coumadin for cath.  INR 1.38 today. On heparin. No bleeding. Discussed dosing with PharmD personally.  4.  AKI on CKD III  - Baseline Unclear. Has ranged from 1.4 - 1.9 in the past several years.  - Likely due to cardiorenal syndrome  - Cr 1.86 -> 1.70 -> 1.55 -> 1.44 -> 1.43-> 1.53 -> 1.52   5. Seizure Disorder - Reported in chart. This is distant. He has not had a seizure since his 20-30s. He is not on medication. Reports having several "grand-mal" seizures with no clear etiology in his youth. No change.   6. Abdominal pain - Work up per pt report negative. CT of the abdomen and pelvis, abdominal ultrasound and CCK HIDA scan all done 10/2018 with no acute findings in the abdomen, no evidence of cholecystitis and noted stable pancreatic body cyst. - Sees [facility]. On Levsin for cramping/diarrhea.  - Improved with diuresis and inotrope support. Suspect mostly related to low output CHF.   6. Hypokalemia - K 3.9 this am. Will supp   7. Goals of Care - Remains partial code. He wishes to think about CPR more and discuss with his wife.  - Palliative Care team following for [place] - He does NOT want LVAD consideration. Would consider home milrinone if needed.  - If QOL decreases he would want ICD off  Medication concerns reviewed with patient and pharmacy team. Barriers identified: None at this  time.   Length of Stay: [place], MD  11/29/2018, 7:52 AM  Advanced Heart Failure Team Pager [phone] (M-F; 7a - 4p)  Please contact [facility] for night-coverage after hours (4p -7a ) and weekends on [email or web address]

## 2018-11-29 NOTE — H&P (View-Only) (Signed)
Advanced Heart Failure Rounding Note  PCP-Cardiologist: No primary care provider on file.   Subjective:    Admitted from [facility] clinic 11/22/2018 with concerns for low output with EF down to 15% from 35-40. CHF team consulted. Initial mixed venous saturation 50%. Started on milrinone 0.25 mcg/kg/min on 1/8  Milrinone stopped 1/10. Co-ox 65%. Feels ok. Nervous about cath. No CP or SOB. No orthopnea or PND. EP has seen regarding PVCs. Dr. Caryl Comes to see formally today. On hepairn. No bleeding.    Echo 11/19/2018 LVEF 15%, Mild AI, Mild MR, Severe LAE, RV mildly dilated, moderately reduced, Mod RAE,  (This is significantly decreased from Echo 12/12/2015 with EF 35-40% with mild RV dilation, normal function at that time)  Objective:   Weight Range: 72.3 kg Body mass index is 24.07 kg/m.   Vital Signs:   Temp:  [97.8 F (36.6 C)-98.2 F (36.8 C)] 98.1 F (36.7 C) (01/13 0346) Pulse Rate:  [79-104] 79 (01/13 0346) Resp:  [15-18] 18 (01/13 0346) BP: (111-116)/(71-89) 111/71 (01/13 0346) SpO2:  [97 %-100 %] 100 % (01/13 0739) Weight:  [72.3 kg] 72.3 kg (01/13 0347) Last BM Date: 11/28/18  Weight change: Filed Weights   11/27/18 0514 11/28/18 0424 11/29/18 0347  Weight: 72.7 kg 72.7 kg 72.3 kg   Intake/Output:   Intake/Output Summary (Last 24 hours) at 11/29/2018 0752 Last data filed at 11/29/2018 6045 Gross per 24 hour  Intake 1435.66 ml  Output 1925 ml  Net -489.34 ml    Physical Exam   General:  Well appearing. No resp difficulty HEENT: normal Neck: supple. no JVD. Carotids 2+ bilat; no bruits. No lymphadenopathy or thryomegaly appreciated. Cor: PMI nondisplaced. Regular rate & rhythm. No rubs, gallops or murmurs. Lungs: clear Abdomen: soft, nontender, nondistended. No hepatosplenomegaly. No bruits or masses. Good bowel sounds. Extremities: no cyanosis, clubbing, rash, edema RUE PICC Neuro: alert & orientedx3, cranial nerves grossly intact. moves all 4 extremities w/o  difficulty. Affect pleasant  Telemetry   BiV paced 80s with frequent unpaced beats/PVCs, Underlying Afib by ICD interrogation, personally reviewed.   EKG    No new tracings.    Labs    CBC Recent Labs    11/28/18 0203 11/29/18 0337  WBC 7.1 7.5  HGB 10.9* 10.8*  HCT 33.9* 33.2*  MCV 98.0 96.8  PLT 209 409   Basic Metabolic Panel Recent Labs    11/28/18 0203 11/29/18 0337  NA 130* 131*  K 3.7 3.9  CL 93* 97*  CO2 27 26  GLUCOSE 121* 117*  BUN 21 23  CREATININE 1.53* 1.52*  CALCIUM 8.5* 8.4*   Liver Function Tests No results for input(s): AST, ALT, ALKPHOS, BILITOT, PROT, ALBUMIN in the last 72 hours. No results for input(s): LIPASE, AMYLASE in the last 72 hours. Cardiac Enzymes No results for input(s): CKTOTAL, CKMB, CKMBINDEX, TROPONINI in the last 72 hours.  BNP: BNP (last 3 results) No results for input(s): BNP in the last 8760 hours.  ProBNP (last 3 results) Recent Labs    11/15/18 1001  PROBNP 1,353.0*     D-Dimer No results for input(s): DDIMER in the last 72 hours. Hemoglobin A1C No results for input(s): HGBA1C in the last 72 hours. Fasting Lipid Panel No results for input(s): CHOL, HDL, LDLCALC, TRIG, CHOLHDL, LDLDIRECT in the last 72 hours. Thyroid Function Tests No results for input(s): TSH, T4TOTAL, T3FREE, THYROIDAB in the last 72 hours.  Invalid input(s): FREET3  Other results:   Imaging  No results found.   Medications:     Scheduled Medications: . [MAR Hold] allopurinol  100 mg Oral Daily  . [MAR Hold] digoxin  0.0625 mg Oral Daily  . [MAR Hold] fluticasone  1 spray Each Nare Daily  . [MAR Hold] furosemide  80 mg Oral Daily  . [MAR Hold] hydrALAZINE  25 mg Oral BID  . [MAR Hold] isosorbide mononitrate  30 mg Oral Daily  . [MAR Hold] montelukast  10 mg Oral QHS  . [MAR Hold] multivitamin with minerals  1 tablet Oral Daily  . [MAR Hold] pantoprazole  40 mg Oral Daily  . [MAR Hold] sodium chloride flush  10-40 mL  Intracatheter Q12H  . sodium chloride flush  3 mL Intravenous Q12H  . [MAR Hold] spironolactone  25 mg Oral Daily  . [MAR Hold] vitamin C  500 mg Oral Daily    Infusions: . sodium chloride    . sodium chloride 10 mL/hr at 11/29/18 0867  . heparin Stopped (11/29/18 6195)  . [MAR Hold] ondansetron (ZOFRAN) IV      PRN Medications: sodium chloride, [MAR Hold] acetaminophen, [MAR Hold] diphenhydrAMINE, [MAR Hold] loperamide, [MAR Hold] nitroGLYCERIN, [MAR Hold] ondansetron (ZOFRAN) IV, [MAR Hold] polyethylene glycol, [MAR Hold] simethicone, [MAR Hold] sodium chloride, [MAR Hold] sodium chloride flush, sodium chloride flush, [MAR Hold] traMADol    Patient Profile   [name] is a 73 y.o. male  with h/o CAD (s/p DES to LCx '03, residual total occlusion of RCA w/ collaterals, normal myoview 2012), ICM s/p Medtronic BiV ICD (upgrade in 2017), h/o Afib/flutter, HTN, HLD, CKD, and seizure disorder.   Admitted from [facility] clinic 11/22/2018 with concerns for low output CHF.   Assessment/Plan   1. Acute on chronic systolic CHF s/p BiV Medtronic ICD with low output - Echo as above with EF down to 15%, previously 35-40%.  - Co-ox and clinical status much improved with milrinone (Initial coox 50%). Milrinone stopped 1/10 - Continues to do well though he remians anxious. Coox 65% this am off milrinone - Continue lasix 80 mg daily.  - Continue hydralazine 25 mg TID and Imdur 30 daily - Lisinopril stopped in 2018 due to AKI. Has ARBs listed as allergy. So no ACE/ARB/ARNI  - Continue spiro to 25 mg daily. (Had Ks in the 5.2 - 5.4 range recently as outpatient, so will need to follow closely). K 4.0 - Continue digoxin 0.0625 mg daily. Level stable at 0.3 11/23/2018 - No candidate for transplant and would not want LVAD. Says he would consider home milrinone if he needs it to maintain QOL  - On tele has many non-paced beats and I think this has probably contributed to his worsening HF. Dr. Caryl Comes to see  today.   2. CAD s/p PCI - He has occasional non-specific left chest pain, and a pain in his mid back that may be due to volume overload vs anginal equivalent. Ideally, will need repeat L/RHC if Cr tolerates and pt agrees. No change. - CFX PCI in 2003, CTO of RCA with collaterals. - Myoview April 2012-no ischemia,inferolateral scar - Plan R/L heart cath this am to see if ischemia responsible for dropping EF. Coumadin on hold  - On heparin without bleeding, Hgb stable  3. Permanent A-fib  - Chronic by ICD interrogation 11/23/2018 - BiV paced ~ 90% of the time by interrogation on admit.  - Holding coumadin for cath.  INR 1.38 today. On heparin. No bleeding. Discussed dosing with PharmD personally.  4.  AKI on CKD III  - Baseline Unclear. Has ranged from 1.4 - 1.9 in the past several years.  - Likely due to cardiorenal syndrome  - Cr 1.86 -> 1.70 -> 1.55 -> 1.44 -> 1.43-> 1.53 -> 1.52   5. Seizure Disorder - Reported in chart. This is distant. He has not had a seizure since his 20-30s. He is not on medication. Reports having several "grand-mal" seizures with no clear etiology in his youth. No change.   6. Abdominal pain - Work up per pt report negative. CT of the abdomen and pelvis, abdominal ultrasound and CCK HIDA scan all done 10/2018 with no acute findings in the abdomen, no evidence of cholecystitis and noted stable pancreatic body cyst. - Sees [facility]. On Levsin for cramping/diarrhea.  - Improved with diuresis and inotrope support. Suspect mostly related to low output CHF.   6. Hypokalemia - K 3.9 this am. Will supp   7. Goals of Care - Remains partial code. He wishes to think about CPR more and discuss with his wife.  - Palliative Care team following for [place] - He does NOT want LVAD consideration. Would consider home milrinone if needed.  - If QOL decreases he would want ICD off  Medication concerns reviewed with patient and pharmacy team. Barriers identified: None at this  time.   Length of Stay: [place], MD  11/29/2018, 7:52 AM  Advanced Heart Failure Team Pager [phone] (M-F; 7a - 4p)  Please contact [facility] for night-coverage after hours (4p -7a ) and weekends on [email or web address]

## 2018-11-29 NOTE — Discharge Instructions (Addendum)
Heart Failure  Heart failure means your heart has trouble pumping blood. This makes it hard for your body to work well. Heart failure is usually a long-term (chronic) condition. You must take good care of yourself and follow your treatment plan from your doctor. Follow these instructions at home: Medicines  Take over-the-counter and prescription medicines only as told by your doctor. ? [name] not stop taking your medicine unless your doctor told you to [name] that. ? [name] not skip any doses. ? Refill your prescriptions before you run out of medicine. You need your medicines every day. Eating and drinking   Eat heart-healthy foods. Talk with a diet and nutrition specialist (dietitian) to make an eating plan.  Choose foods that: ? Have no trans fat. ? Are low in saturated fat and cholesterol.  Choose healthy foods, like: ? Fresh or frozen fruits and vegetables. ? Fish. ? Low-fat (lean) meats. ? Legumes (like beans, peas, and lentils). ? Fat-free or low-fat dairy products. ? Whole-grain foods. ? High-fiber foods.  Limit salt (sodium) if told by your doctor. Ask your nutrition specialist to recommend heart-healthy seasonings.  Cook in healthy ways instead of frying. Healthy ways of cooking include: ? Roasting. ? Grilling. ? Broiling. ? Baking. ? Poaching. ? Steaming. ? Stir-frying.  Limit how much fluid you drink, if told by your doctor. Lifestyle  [name] not smoke or use chewing tobacco. [name] not use nicotine gum or patches before talking to your doctor.  Limit alcohol intake to no more than 1 drink a day for non-pregnant women and 2 drinks a day for men. One drink equals 12 oz of beer, 5 oz of wine, or 1 oz of hard liquor. ? Tell your doctor if you drink alcohol many times a week. ? Talk with your doctor about whether any alcohol is safe for you. ? You should stop drinking alcohol: ? If your heart has been damaged by alcohol. ? You have very bad heart failure.  [name] not use illegal  drugs.  Lose weight if told by your doctor.  [name] moderate physical activity if told by your doctor. Ask your doctor [name] activities are safe for you if: ? You are of older age (elderly). ? You have very bad heart failure. Keep track of important information  Weigh yourself every day. ? Weigh yourself every morning after you pee (urinate) and before breakfast. ? Wear the same amount of clothing each time. ? Write down your daily weight. Give your record to your doctor.  Check and write down your blood pressure as told by your doctor.  Check your pulse as told by your doctor. Dealing with heat and cold  If the weather is very hot: ? Avoid activity that takes a lot of energy. ? Use air conditioning or fans, or find a cooler place. ? Avoid caffeine. ? Avoid alcohol. ? Wear clothing that is loose-fitting, lightweight, and light-colored.  If the weather is very cold: ? Avoid activity that takes a lot of energy. ? Layer your clothes. ? Wear mittens or gloves, a hat, and a scarf when you go outside. ? Avoid alcohol. General instructions  Manage other conditions that you have as told by your doctor.  Learn to manage stress. If you need help, ask your doctor.  Plan rest periods for when you get tired.  Get education and support as needed.  Get rehab (rehabilitation) to help you stay independent and to help with everyday tasks.  Stay up to date with shots (  immunizations), especially pneumococcal and flu (influenza) shots.  Keep all follow-up visits as told by your doctor. This is important. Contact a doctor if:  You gain weight quickly.  You are more short of breath than normal.  You cannot [name] your normal activities.  You tire easily.  You cough more than normal, especially with activity.  You have any or more puffiness (swelling) in areas such as your hands, feet, ankles, or belly (abdomen).  You cannot sleep because it is hard to breathe.  You feel like your heart  is beating fast (palpitations).  You get dizzy or light-headed when you stand up. Get help right away if:  You have trouble breathing.  You or someone else notices a change in your awareness. This could be trouble staying awake or trouble concentrating.  You have chest pain or discomfort.  You pass out (faint). Summary  Heart failure means your heart has trouble pumping blood.  Make sure you refill your prescriptions before you run out of medicine. You need your medicines every day.  Keep records of your weight and blood pressure to give to your doctor.  Contact a doctor if you gain weight quickly. This information is not intended to replace advice given to you by your health care provider. Make sure you discuss any questions you have with your health care provider. Document Released: 08/12/2008 Document Revised: 07/28/2018 Document Reviewed: 11/25/2016 Elsevier Interactive Patient Education  2019 [employer] on my medicine - ELIQUIS (apixaban)  This medication education was reviewed with me or my healthcare representative as part of my discharge preparation.    Why was Eliquis prescribed for you? Eliquis was prescribed for you to reduce the risk of forming blood clots that can cause a stroke if you have a medical condition called atrial fibrillation (a type of irregular heartbeat) OR to reduce the risk of a blood clots forming after orthopedic surgery.  [name] [name] You need to know about Eliquis ? Take your Eliquis TWICE DAILY - one tablet in the morning and one tablet in the evening with or without food.  It would be best to take the doses about the same time each day.  If you have difficulty swallowing the tablet whole please discuss with your pharmacist how to take the medication safely.  Take Eliquis exactly as prescribed by your doctor and [name] NOT stop taking Eliquis without talking to the doctor who prescribed the medication.  Stopping may increase your  risk of developing a new clot or stroke.  Refill your prescription before you run out.  After discharge, you should have regular check-up appointments with your healthcare provider that is prescribing your Eliquis.  In the future your dose may need to be changed if your kidney function or weight changes by a significant amount or as you get older.  [name] you [name] if you miss a dose? If you miss a dose, take it as soon as you remember on the same day and resume taking twice daily.  [name] not take more than one dose of ELIQUIS at the same time.  Important Safety Information A possible side effect of Eliquis is bleeding. You should call your healthcare provider right away if you experience any of the following: ? Bleeding from an injury or your nose that does not stop. ? Unusual colored urine (red or dark brown) or unusual colored stools (red or black). ? Unusual bruising for unknown reasons. ? A serious fall or if you hit your head (  even if there is no bleeding).  Some medicines may interact with Eliquis and might increase your risk of bleeding or clotting while on Eliquis. To help avoid this, consult your healthcare provider or pharmacist prior to using any new prescription or non-prescription medications, including herbals, vitamins, non-steroidal anti-inflammatory drugs (NSAIDs) and supplements.  This website has more information on Eliquis (apixaban): [email or web address].

## 2018-11-29 NOTE — Consult Note (Addendum)
Cardiology Consultation:   Patient ID: [name] MRN: [id number]; DOB: 06/18/1946  Admit date: 11/22/2018 Date of Consult: 11/29/2018  Primary Care Provider: [name], MD Primary Cardiologist: Dr. Percival Spanish Primary Electrophysiologist:  Dr. Caryl Comes   Patient Profile:   [name] is a 73 y.o. male with a hx of Chronic CHF (systolic), CAD, ICM, CRT-D, permanent AFib, HTN, HLD, CKD, seizure d/o who is being seen today for the evaluation of reduced CRT pacing at the request of Dr. Haroldine Laws.  History of Present Illness:   [name] was admitted to [facility] 11/23/18 with acute on chronic CHF exacerbation, upon his AHF team consultation expressed his wishes for end of life issues, nad is currently a partial code,to include intubation and defibrillation but no CPR, he did not want to be considered for LVAD or surgical interventions He was found with low output HF and started on Milrinone,   He is now off Milrinone since 1/10, Coox this AM was 65%, s/p R/LHC this AM, no interventions, Moderately elevated L-sided filling pressures with moderately decreased CO, EP is asked to see with ectopy on telemetry noted and reduced CRT pacing as contributors to his worsened LVEF and clinical status.  He is cumulatively fluid neg -5137ml, and down 3.5kg He is feeling markedly improved from when he came.  No rest SOB, has not been up and about much today, no CP.  He is hoping to avoid home milrinone.  Device information: MDT CRT-D RA/LV leads are from 2007 (abandoned [phone] lead in) Gen change, new RV lead 2012 gen change 03/05/2016  Interrogation: today Programmed VVIR 70 (a lead sensitivity is programmed at 4.65mV) AFib underlying  OptiVol is well above threshold though trending down now V pacing 67.5% with trigger pacing 29.4%  Past Medical History:  Diagnosis Date  . 6949-lead    replaced 02/2011  . Acute on chronic renal insufficiency   . AICD (automatic cardioverter/defibrillator)  present    Medtronic CRT  . Asthma   . Atrial flutter ([place])    s/p TEE guided cardioversion  . CHF (congestive heart failure) ([place])   . Chronic drug-induced interstitial lung disorders ([place])   . COPD (chronic obstructive pulmonary disease) ([place])    " very MILD "  . Coronary artery disease    total occlusion of the right coronary artery, left to right collaterals.  He had Cypher stenting to the circumflex in  July 2003  . Family hx of colon cancer    2 SISTERS  . Fatty liver    CT  . GERD (gastroesophageal reflux disease)   . Headache(784.0)   . Hyperlipemia   . Hypertension   . Hyponatremia   . Ischemic cardiomyopathy    Myoview 02/2011  EF 28%  Coronary artery disease  (total occlusion of the right coronary artery, left-to-right )  . LBBB (left bundle branch block)   . Nonsustained ventricular tachycardia ([place])   . Seizure disorder [facility])     Past Surgical History:  Procedure Laterality Date  . CORONARY ANGIOPLASTY    . EP IMPLANTABLE DEVICE N/A 03/05/2016   Procedure: ICD  Generator Changeout;  Surgeon: Deboraha Sprang, MD;  Location: [place] [facility];  Service: Cardiovascular;  Laterality: N/A;  . ESOPHAGOGASTRODUODENOSCOPY  09/13/2012   Procedure: ESOPHAGOGASTRODUODENOSCOPY (EGD);  Surgeon: Ladene Artist, MD,FACG;  Location: [facility];  Service: Endoscopy;  Laterality: N/A;  . HERNIA REPAIR    . INSERT / REPLACE / REMOVE PACEMAKER  AICD medtronic  . RIGHT HEART CATHETERIZATION N/A 09/17/2012   Procedure: RIGHT HEART CATH;  Surgeon: Jolaine Artist, MD;  Location: [facility] CATH LAB;  Service: Cardiovascular;  Laterality: N/A;     Home Medications:  Prior to Admission medications   Medication Sig Start Date End Date Taking? Authorizing Provider  allopurinol (ZYLOPRIM) 100 MG tablet Take 100 mg by mouth daily.   Yes [provider]  bisoprolol (ZEBETA) 5 MG tablet TAKE 1/2 TABLET BY MOUTH DAILY Patient taking differently: Take 2.5 mg by mouth daily.  02/01/18   Yes Minus Breeding, MD  Calcium-Magnesium 500-250 MG TABS Take 1 tablet by mouth daily.    Yes [provider]  carvedilol (COREG CR) 20 MG 24 hr capsule TAKE 1 CAPSULE BY MOUTH EVERY DAY Patient taking differently: Take 20 mg by mouth daily.  09/24/18  Yes Minus Breeding, MD  digoxin (LANOXIN) 0.125 MG tablet TAKE 1 TABLET BY MOUTH EVERY OTHER DAY (KEEP OFFICE VISIT) Patient taking differently: Take 0.0625 mg by mouth daily.  07/23/18  Yes Minus Breeding, MD  diphenhydrAMINE (BENADRYL) 25 mg capsule Take 25 mg by mouth every 6 (six) hours as needed for allergies.    Yes [provider]  fish oil-omega-3 fatty acids 1000 MG capsule Take 2 g by mouth daily.   Yes [provider]  furosemide (LASIX) 40 MG tablet Take 1 tablet (40 mg total) by mouth daily. 11/18/18 02/16/19 Yes Kilroy, Luke K, [name]  hydrALAZINE (APRESOLINE) 25 MG tablet Take 1 tablet (25 mg total) by mouth 2 (two) times daily. 07/28/18  Yes Minus Breeding, MD  loperamide (IMODIUM A-D) 2 MG tablet Take 2 mg by mouth as needed for diarrhea or loose stools.   Yes [provider]  magnesium oxide (MAG-OX) 400 MG tablet Take 1 tablet (400 mg total) by mouth daily. 03/19/15  Yes Deboraha Sprang, MD  montelukast (SINGULAIR) 10 MG tablet Take 10 mg by mouth daily.  11/18/17  Yes [provider]  Multiple Vitamin (MULTIVITAMIN WITH MINERALS) TABS Take 1 tablet by mouth daily.   Yes [provider]  nitroGLYCERIN (NITROSTAT) 0.4 MG SL tablet Place 0.4 mg under the tongue every 5 (five) minutes as needed for chest pain. (up to 3 doses) For chest pain 06/03/12  Yes Minus Breeding, MD  omeprazole (PRILOSEC) 40 MG capsule Take 1 capsule (40 mg total) by mouth every morning. 11/15/18  Yes [name], [name], [name]  spironolactone (ALDACTONE) 25 MG tablet TAKE HALF TABLET DAILY Patient taking differently: Take 12.5 mg by mouth daily.  06/08/18  Yes Minus Breeding, MD  traMADol (ULTRAM) 50 MG tablet Take 1  tablet by mouth every 6 hours as needed for pain. Patient taking differently: Take 50 mg by mouth every 6 (six) hours as needed for moderate pain.  11/15/18  Yes [name], [name], [name]  warfarin (COUMADIN) 5 MG tablet TAKE 1 TO 1 AND 1/2 TABLETS BY MOUTH DAILY AS DIRECTED BY COUMADIN CLINIC Patient taking differently: Take 5-7.5 mg by mouth See admin instructions. Take 1 tablet by mouth daily expect on Saturday Take 1 1/2 tablet by mouth. 09/07/18  Yes Minus Breeding, MD  hyoscyamine (LEVSIN SL) 0.125 MG SL tablet Place 1 tablet (0.125 mg total) under the tongue every 4 (four) hours as needed. Patient not taking: Reported on 11/22/2018 11/22/18   [name], [name]    Inpatient Medications: Scheduled Meds: . allopurinol  100 mg Oral Daily  . digoxin  0.0625 mg Oral  Daily  . fluticasone  1 spray Each Nare Daily  . furosemide  80 mg Oral Daily  . hydrALAZINE  25 mg Oral BID  . isosorbide mononitrate  30 mg Oral Daily  . montelukast  10 mg Oral QHS  . multivitamin with minerals  1 tablet Oral Daily  . pantoprazole  40 mg Oral Daily  . sodium chloride flush  10-40 mL Intracatheter Q12H  . sodium chloride flush  3 mL Intravenous Q12H  . spironolactone  25 mg Oral Daily  . vitamin C  500 mg Oral Daily   Continuous Infusions: . sodium chloride    . heparin Stopped (11/29/18 9169)   PRN Meds: sodium chloride, acetaminophen, diphenhydrAMINE, loperamide, nitroGLYCERIN, ondansetron (ZOFRAN) IV, polyethylene glycol, simethicone, sodium chloride, sodium chloride flush, sodium chloride flush, traMADol  Allergies:    Allergies  Allergen Reactions  . Bentyl [Dicyclomine Hcl] Other (See Comments)    Blisters in mouth, lips swelling    . Sulfa Antibiotics Shortness Of Breath  . Ace Inhibitors     Unknown  . Corzide [Nadolol-Bendroflumethiazide]     Unknown  . Diovan [Valsartan]     Unknown  . Flagyl [Metronidazole] Other (See Comments)    Unknown  . Omeprazole     Shut my kidneys down    . Other     Perfumes smells; paint smells; formaldehyde smells  . Shellfish Allergy   . Tape Other (See Comments)  . Amlodipine Besylate Other (See Comments)    Numbness to lips; made legs give out  . Codeine Itching and Rash    Itching  . Latex Itching and Rash  . Norvasc [Amlodipine Besylate] Other (See Comments)    Numbness to lips; made legs give out  . Penicillins Rash    Childhood reaction    Social History:   Social History   Socioeconomic History  . Marital status: Married    Spouse name: Not on file  . Number of children: 1  . Years of education: Not on file  . Highest education level: Not on file  Occupational History  . Occupation: RETIRED    Employer: RETIRED  Social Needs  . Financial resource strain: Not on file  . Food insecurity:    Worry: Not on file    Inability: Not on file  . Transportation needs:    Medical: Not on file    Non-medical: Not on file  Tobacco Use  . Smoking status: Never Smoker  . Smokeless tobacco: Never Used  Substance and Sexual Activity  . Alcohol use: Yes    Comment: OCCASIONALLY NOT OFTEN  . Drug use: No  . Sexual activity: Not on file  Lifestyle  . Physical activity:    Days per week: Not on file    Minutes per session: Not on file  . Stress: Not on file  Relationships  . Social connections:    Talks on phone: Not on file    Gets together: Not on file    Attends religious service: Not on file    Active member of club or organization: Not on file    Attends meetings of clubs or organizations: Not on file    Relationship status: Not on file  . Intimate partner violence:    Fear of current or ex partner: Not on file    Emotionally abused: Not on file    Physically abused: Not on file    Forced sexual activity: Not on file  Other Topics Concern  .  Not on file  Social History Narrative  . Not on file    Family History:   Family History  Problem Relation Age of Onset  . Colon cancer Sister        BOTH SISTERS  1 DESEASED FROM COLON CANCER  . Colon cancer Sister   . Heart disease Mother   . Heart disease Father      ROS:  Please see the history of present illness.  All other ROS reviewed and negative.     Physical Exam/Data:   Vitals:   11/29/18 0920 11/29/18 0925 11/29/18 1000 11/29/18 1023  BP: 115/75 103/69 106/80 123/77  Pulse: 77 72  82  Resp: 18 17 12    Temp:      TempSrc:      SpO2: 93% 97%    Weight:      Height:        Intake/Output Summary (Last 24 hours) at 11/29/2018 1105 Last data filed at 11/29/2018 1028 Gross per 24 hour  Intake 1198.66 ml  Output 1625 ml  Net -426.34 ml   Last 3 Weights 11/29/2018 11/28/2018 11/27/2018  Weight (lbs) 159 lb 8 oz 160 lb 3.2 oz 160 lb 3.2 oz  Weight (kg) 72.349 kg 72.666 kg 72.666 kg     Body mass index is 24.07 kg/m.  General:  Well nourished, well developed, in no acute distress HEENT: normal Lymph: no adenopathy Neck: no JVD Endocrine:  No thryomegaly Vascular: No carotid bruits Cardiac:  irreg-irreg; no murmurs, gallops or rubs Lungs:  CTA b/l, no wheezing, rhonchi or rales  Abd: soft, nontender Ext: trace edema Musculoskeletal:  No deformities, age appropriate atrophy Skin: warm and dry  Neuro: no gross focal abnormalities noted Psych:  Normal affect   EKG:  The EKG was personally reviewed and demonstrates:    AFib 71bpm, V paced (diferent paced morphology, ? leads  11/18/2018 is AFib, V paced with good CRT morphology  Telemetry:  Telemetry was personally reviewed and demonstrates:   AFib, V pacing with different morphologies  Relevant CV Studies:  R/LHC today  Mid RCA to Dist RCA lesion is 70% stenosed.  Prox RCA to Mid RCA lesion is 60% stenosed.  Ost LM to Mid LM lesion is 20% stenosed.  Ost 1st Mrg to 1st Mrg lesion is 60% stenosed.  Prox LAD lesion is 30% stenosed.   Findings:  Ao = 105/65 (82) LV =  106/24 RA =  5 RV = 52/6 PA = 56/25 (37) PCW = 28 (v waves to 35) Fick cardiac output/index  = 4.3/2.3 PVR = 2.0 WU FA sat = 99% PA sat = 58%, 62%  Assessment: 1. Mild non-obstructive CAD 2. Severe NICM with EF 10% 3. Moderately elevated L-sided filling pressures with moderately decreased CO    11/19/18: TTE Study Conclusions - Left ventricle: The cavity size was severely dilated. Wall   thickness was normal. Systolic function was severely reduced. The   estimated ejection fraction was 15%. Diffuse hypokinesis. There   is akinesis of the inferolateral myocardium. The study is not   technically sufficient to allow evaluation of LV diastolic   function. - Aortic valve: There was mild regurgitation. - Mitral valve: There was mild regurgitation. - Left atrium: The atrium was severely dilated. - Right ventricle: The cavity size was mildly dilated. Systolic   function was moderately reduced. - Right atrium: The atrium was moderately dilated. - Pulmonary arteries: Systolic pressure was mildly increased. - Pericardium, extracardiac: A trivial pericardial effusion  was   identified. Impressions: - Definity used; severe LV dysfunction; 4 chamber enlargement; mild   AI, MR and TR; moderate RV dysfunction; mild pulmonary   hypertension.  2017 LVEF was 35-40%  Laboratory Data:  Chemistry Recent Labs  Lab 11/27/18 0517 11/28/18 0203 11/29/18 0337  NA 132* 130* 131*  K 4.0 3.7 3.9  CL 95* 93* 97*  CO2 27 27 26   GLUCOSE 87 121* 117*  BUN 21 21 23   CREATININE 1.43* 1.53* 1.52*  CALCIUM 8.7* 8.5* 8.4*  GFRNONAA 48* 44* 44*  GFRAA 56* 51* 52*  ANIONGAP 10 10 8     Recent Labs  Lab 11/22/18 1700  PROT 6.9  ALBUMIN 3.4*  AST 32  ALT 31  ALKPHOS 80  BILITOT 1.6*   Hematology Recent Labs  Lab 11/27/18 0517 11/28/18 0203 11/29/18 0337  WBC 6.8 7.1 7.5  RBC 3.43* 3.46* 3.43*  HGB 10.9* 10.9* 10.8*  HCT 33.8* 33.9* 33.2*  MCV 98.5 98.0 96.8  MCH 31.8 31.5 31.5  MCHC 32.2 32.2 32.5  RDW 14.6 14.6 14.6  PLT 213 209 206   Cardiac EnzymesNo results for  input(s): TROPONINI in the last 168 hours. No results for input(s): TROPIPOC in the last 168 hours.  BNPNo results for input(s): BNP, PROBNP in the last 168 hours.  DDimer No results for input(s): DDIMER in the last 168 hours.  Radiology/Studies:  No results found.  Assessment and Plan:   1. Worsened LVEF 2. Acute on chronic CHF    V sensed response pacing % is up from last week, likely 2/2 increased baseline HR perhaps 2/2 CHF and or milrinone Differing morphologies on telemetry are suspect to be triggered pacing(fused) and not PVCs  We have discussed; Amiodarone to help better control AFib rate Resumption of BB when able and titrate to BP tolerance AV node ablation (this the patient states he would not want to pursue, states the idea of being dependent on the device makes him uncomfortable)  Unable to tell his PVC burden with his current programming vs VSP Planned to have pacing rate reduced for a few minutes to try and establish a better picture of this, Dr. Caryl Comes will see again later today.     For questions or updates, please contact [place] Please consult [email or web address] for contact info under     Signed, Baldwin [place], [name]  11/29/2018 11:05 AM   Hx and PE as outlined above   Atrial fibrillation-permanent  Congestive heart failure class IIIb-4a  Ischemic/nonischemic cardiomyopathy  ICD-Medtronic  DNR status  The patient has atrial fibrillation with a rapid rate resulting in some CRT and some ventricular sensed trigger pacing.  This latter has comprised 10% or so of his beats over the last year or so but with the introduction of milrinone and his recent decompensation is now at about 25%.  PVC burden appears to be quite low.  Treatment options to enhance biventricular pacing would include augmented rate control pharmacologically or procedurally.  The patient is adamantly opposed to A-V ablation.  Hence, we have discussed using amiodarone as a rate  controlling agent.  He is open to this.  I have reviewed this with Dr. Reine Just.  He is agreement  We have reviewed side effects.  We will begin him on 400 every 8.  In the event that nausea supervenes, we will decrease it to 200 every 6.  We will be able to follow his ventricular sensed response (VSR) remotely.  We have also discussed  the role of high-voltage resuscitative therapy via his ICD.  He is clear that he would like not to be shocked >> Hence have inactivated HV therapies and changed the DNR order

## 2018-11-29 NOTE — Care Management (Signed)
#    2.   S/W  MIKE  @  [employer] # [phone]  OPT- 3   1. APIXABAN     5 MG BID COVER-  NONE FORMULARY PRIOR APPROVAL-  YES # [phone]   2. ELIQUIS  5 MG BID COVER- YES CO-PAY- $ 8.95 TIER- 3 DRUG PRIOR APPROVAL-  NO  3. ELIQUIS  2.5 MG BID COVER- YES CO-PAY- $ 8.95 TIER - 3 DRUG PRIOR APPROVAL- NO  PREFERRED PHARMACY : YES   -   [facility]

## 2018-11-29 NOTE — Interval H&P Note (Signed)
History and Physical Interval Note:  11/29/2018 7:55 AM  [name]  has presented today for surgery, with the diagnosis of HF  The various methods of treatment have been discussed with the patient and family. After consideration of risks, benefits and other options for treatment, the patient has consented to  Procedure(s): RIGHT/LEFT HEART CATH AND CORONARY ANGIOGRAPHY (N/A) and possible coronary angioplasty as a surgical intervention .  The patient's history has been reviewed, patient examined, no change in status, stable for surgery.  I have reviewed the patient's chart and labs.  Questions were answered to the patient's satisfaction.     Daniel Bensimhon

## 2018-11-30 LAB — CBC
HCT: 36 % — ABNORMAL LOW (ref 39.0–52.0)
HEMOGLOBIN: 11.7 g/dL — AB (ref 13.0–17.0)
MCH: 32.1 pg (ref 26.0–34.0)
MCHC: 32.5 g/dL (ref 30.0–36.0)
MCV: 98.9 fL (ref 80.0–100.0)
Platelets: 247 10*3/uL (ref 150–400)
RBC: 3.64 MIL/uL — ABNORMAL LOW (ref 4.22–5.81)
RDW: 14.9 % (ref 11.5–15.5)
WBC: 7.2 10*3/uL (ref 4.0–10.5)
nRBC: 0 % (ref 0.0–0.2)

## 2018-11-30 LAB — COOXEMETRY PANEL
Carboxyhemoglobin: 1.4 % (ref 0.5–1.5)
Methemoglobin: 1.7 % — ABNORMAL HIGH (ref 0.0–1.5)
O2 Saturation: 60.6 %
TOTAL HEMOGLOBIN: 12.1 g/dL (ref 12.0–16.0)

## 2018-11-30 LAB — BASIC METABOLIC PANEL
Anion gap: 11 (ref 5–15)
BUN: 21 mg/dL (ref 8–23)
CO2: 24 mmol/L (ref 22–32)
Calcium: 8.7 mg/dL — ABNORMAL LOW (ref 8.9–10.3)
Chloride: 96 mmol/L — ABNORMAL LOW (ref 98–111)
Creatinine, Ser: 1.59 mg/dL — ABNORMAL HIGH (ref 0.61–1.24)
GFR calc Af Amer: 49 mL/min — ABNORMAL LOW (ref >60)
GFR calc non Af Amer: 42 mL/min — ABNORMAL LOW (ref >60)
Glucose, Bld: 120 mg/dL — ABNORMAL HIGH (ref 70–99)
Potassium: 4.1 mmol/L (ref 3.5–5.1)
Sodium: 131 mmol/L — ABNORMAL LOW (ref 135–145)

## 2018-11-30 MED ORDER — POTASSIUM CHLORIDE CRYS ER 20 MEQ PO TBCR
40.0000 meq | EXTENDED_RELEASE_TABLET | Freq: Once | ORAL | Status: AC
  Administered 2018-11-30: 40 meq via ORAL
  Filled 2018-11-30: qty 2

## 2018-11-30 MED ORDER — FUROSEMIDE 10 MG/ML IJ SOLN
80.0000 mg | Freq: Two times a day (BID) | INTRAMUSCULAR | Status: AC
  Administered 2018-11-30 (×2): 80 mg via INTRAVENOUS
  Filled 2018-11-30 (×2): qty 8

## 2018-11-30 MED ORDER — FUROSEMIDE 10 MG/ML IJ SOLN: 80.0000 mg | Freq: Two times a day (BID) | INTRAMUSCULAR | Status: DC

## 2018-11-30 MED FILL — Heparin Sod (Porcine)-NaCl IV Soln 1000 Unit/500ML-0.9%: INTRAVENOUS | Qty: 500 | Status: AC

## 2018-11-30 NOTE — Progress Notes (Signed)
[facility] Infusion Coordinator met with pt to educate about POC for possible home milrinone. Patient and wife had many questions about home milrinone. I spent about 45 minutes with them to educate and answer their questions.   Pt concerned about possible copay/cost which we will have this information in the a.m. and I will share with the pt and wife.  If patient discharges after hours, please call [phone].   Larry Sierras 11/30/2018, 5:00 PM

## 2018-11-30 NOTE — Plan of Care (Signed)
  Problem: Education: Goal: Ability to demonstrate management of disease process will improve Outcome: Progressing   Problem: Activity: Goal: Capacity to carry out activities will improve Outcome: Completed/Met

## 2018-11-30 NOTE — Progress Notes (Signed)
Nutrition Education Note  RD consulted for nutrition education regarding low sodium diet for CHF.  RD provided "Low Sodium Nutrition Therapy" handout from the Academy of [employer]etics. Reviewed patient's dietary recall. Patient has an excellent diet. His wife prepares all meals "from scratch," limiting processed, high sodium, and high fat foods. Encouraged fresh fruits and vegetables as well as whole grain sources of carbohydrates to maximize fiber intake.   RD discussed why it is important for patient to adhere to diet recommendations, and emphasized the role of fluids, foods to avoid, and importance of weighing self daily. Teach back method used.  Expect good compliance.  Body mass index is 23.91 kg/m. Pt meets criteria for normal weight based on current BMI.  Current diet order is heart healthy, patient is consuming approximately 75-100% of meals at this time. Labs and medications reviewed. No further nutrition interventions warranted at this time. RD contact information provided. If additional nutrition issues arise, please re-consult RD.   Molli Barrows, RD, LDN, [place] Pager [phone] After Hours Pager [phone]

## 2018-11-30 NOTE — Progress Notes (Addendum)
Advanced Heart Failure Rounding Note  PCP-Cardiologist: No primary care provider on file.   Subjective:    Admitted from [facility] clinic 11/22/2018 with concerns for low output with EF down to 15% from 35-40. CHF team consulted. Initial mixed venous saturation 50%. Started on milrinone 0.25 mcg/kg/min on 1/8  Milrinone stopped 1/10. Co-ox 61%. Had [facility] yesterday (see below).   EP consulted and recommended amio 400 mg TID and BB when able to help decrease AF rate and decrease RV pacing. PVC burden not thought to be very high. Pt is uninterested in AVN ablation and requested that ICD therapies be turned off. Switched from coumadin to Eliquis.   Had a little abdominal pain this morning that resolved with gas-x. Denies SOB. Ambulating in room with no problems. Legs feel weak. Lots of questions about diet today.     R/LHC 11/29/2018:  Mid RCA to Dist RCA lesion is 70% stenosed.  Prox RCA to Mid RCA lesion is 60% stenosed.  Ost LM to Mid LM lesion is 20% stenosed.  Ost 1st Mrg to 1st Mrg lesion is 60% stenosed.  Prox LAD lesion is 30% stenosed. Findings: Ao = 105/65 (82) LV =  106/24 RA =  5 RV = 52/6 PA = 56/25 (37) PCW = 28 (v waves to 35) Fick cardiac output/index = 4.3/2.3 PVR = 2.0 WU FA sat = 99% PA sat = 58%, 62% Assessment: 1. Mild non-obstructive CAD 2. Severe NICM with EF 10% 3. Moderately elevated L-sided filling pressures with moderately decreased CO  Echo 11/19/2018 LVEF 15%, Mild AI, Mild MR, Severe LAE, RV mildly dilated, moderately reduced, Mod RAE,  (This is significantly decreased from Echo 12/12/2015 with EF 35-40% with mild RV dilation, normal function at that time)  Objective:   Weight Range: 71.8 kg Body mass index is 23.91 kg/m.   Vital Signs:   Temp:  [97.5 F (36.4 C)-98.4 F (36.9 C)] 97.7 F (36.5 C) (01/14 0446) Pulse Rate:  [82-94] 84 (01/14 0446) Resp:  [12-20] 20 (01/13 1156) BP: (106-123)/(66-80) 114/77 (01/14 0446) SpO2:  [94 %-97 %]  97 % (01/14 0446) Weight:  [71.8 kg] 71.8 kg (01/14 0446) Last BM Date: 11/28/18  Weight change: Filed Weights   11/28/18 0424 11/29/18 0347 11/30/18 0446  Weight: 72.7 kg 72.3 kg 71.8 kg   Intake/Output:   Intake/Output Summary (Last 24 hours) at 11/30/2018 0947 Last data filed at 11/30/2018 0446 Gross per 24 hour  Intake 1533 ml  Output 1350 ml  Net 183 ml    Physical Exam   General: Well appearing. No resp difficulty. Walking around in room.  HEENT: Normal Neck: Supple. JVP to jaw. Carotids 2+ bilat; no bruits. No thyromegaly or nodule noted. Cor: PMI laterally displaced. RRR, No M/G/R noted Lungs: CTAB, normal effort. Abdomen: Soft, non-tender, non-distended, no HSM. No bruits or masses. +BS  Extremities: No cyanosis, clubbing, or rash. R and LLE 1-2+ edema. BLE TED hose on. RUE PICC.  Neuro: Alert & orientedx3, cranial nerves grossly intact. moves all 4 extremities w/o difficulty. Affect pleasant  Telemetry   BiV pacing and RV pacing with a lot of variation. HR 90s. Discussed with EP NP today - he goes in and out of BiV pacing and trigger pacing due to elevated AF rate. Personally reviewed.   EKG    No new tracings.   Labs    CBC Recent Labs    11/29/18 0337 11/30/18 0525  WBC 7.5 7.2  HGB 10.8* 11.7*  HCT 33.2* 36.0*  MCV 96.8 98.9  PLT 206 962   Basic Metabolic Panel Recent Labs    11/29/18 0337 11/30/18 0525  NA 131* 131*  K 3.9 4.1  CL 97* 96*  CO2 26 24  GLUCOSE 117* 120*  BUN 23 21  CREATININE 1.52* 1.59*  CALCIUM 8.4* 8.7*   Liver Function Tests No results for input(s): AST, ALT, ALKPHOS, BILITOT, PROT, ALBUMIN in the last 72 hours. No results for input(s): LIPASE, AMYLASE in the last 72 hours. Cardiac Enzymes No results for input(s): CKTOTAL, CKMB, CKMBINDEX, TROPONINI in the last 72 hours.  BNP: BNP (last 3 results) No results for input(s): BNP in the last 8760 hours.  ProBNP (last 3 results) Recent Labs    11/15/18 1001    PROBNP 1,353.0*     D-Dimer No results for input(s): DDIMER in the last 72 hours. Hemoglobin A1C No results for input(s): HGBA1C in the last 72 hours. Fasting Lipid Panel No results for input(s): CHOL, HDL, LDLCALC, TRIG, CHOLHDL, LDLDIRECT in the last 72 hours. Thyroid Function Tests No results for input(s): TSH, T4TOTAL, T3FREE, THYROIDAB in the last 72 hours.  Invalid input(s): FREET3  Other results:   Imaging    No results found.   Medications:     Scheduled Medications: . allopurinol  100 mg Oral Daily  . amiodarone  400 mg Oral Q8H  . apixaban  5 mg Oral BID  . digoxin  0.0625 mg Oral Daily  . fluticasone  1 spray Each Nare Daily  . furosemide  80 mg Oral Daily  . hydrALAZINE  25 mg Oral BID  . isosorbide mononitrate  30 mg Oral Daily  . montelukast  10 mg Oral QHS  . multivitamin with minerals  1 tablet Oral Daily  . pantoprazole  40 mg Oral Daily  . sodium chloride flush  10-40 mL Intracatheter Q12H  . sodium chloride flush  3 mL Intravenous Q12H  . spironolactone  25 mg Oral Daily  . vitamin C  500 mg Oral Daily    Infusions: . sodium chloride      PRN Medications: sodium chloride, acetaminophen, diphenhydrAMINE, guaiFENesin-dextromethorphan, loperamide, nitroGLYCERIN, ondansetron (ZOFRAN) IV, polyethylene glycol, simethicone, sodium chloride, sodium chloride flush, sodium chloride flush, traMADol    Patient Profile   [name] is a 73 y.o. male  with h/o CAD (s/p DES to LCx '03, residual total occlusion of RCA w/ collaterals, normal myoview 2012), ICM s/p Medtronic BiV ICD (upgrade in 2017), h/o Afib/flutter, HTN, HLD, CKD, and seizure disorder.   Admitted from [facility] clinic 11/22/2018 with concerns for low output CHF.   Assessment/Plan   1. Acute on chronic systolic CHF s/p BiV Medtronic ICD with low output - Echo as above with EF down to 15%, previously 35-40%.  - Co-ox and clinical status much improved with milrinone (Initial coox 50%).  Milrinone stopped 1/10 - Volume trending back up. CVP 14-15 on my check. Give additional 80 mg IV lasix x 2 today. Plan to transition to torsemide 40 mg daily tomorrow if fluid looks okay.  - Continues to do well though he remians anxious. Coox 61% this am off milrinone. CI 2.3 on LHC yesterday.  - Continue hydralazine 25 mg TID and Imdur 30 daily. SBP 110s.  - Lisinopril stopped in 2018 due to AKI. Has ARBs listed as allergy. So no ACE/ARB/ARNI  - Continue spiro to 25 mg daily. (Had Ks in the 5.2 - 5.4 range recently as outpatient, so will need  to follow closely). K 4.1 today.  - Continue digoxin 0.0625 mg daily. Level stable at 0.3 11/23/2018 - Not a candidate for transplant and would not want LVAD. Says he would consider home milrinone if he needs it to maintain QOL  - Dr Caryl Comes saw yesterday regarding PVCs and AF, thought to be contributing to worsening HF. See discussion below.  - Will discuss starting BB with Dr Haroldine Laws today.  - Consult RD to help with diet education. HF [facility] RN ordered for DC.   2. CAD s/p PCI - He has occasional non-specific left chest pain, and a pain in his mid back that may be due to volume overload vs anginal equivalent.  - CFX PCI in 2003, CTO of RCA with collaterals. - Myoview April 2012-no ischemia,inferolateral scar - LHC yesterday showed mild non obstructive CAD.   3. Permanent A-fib  - Chronic by ICD interrogation 11/23/2018 - BiV paced ~ 90% of the time by interrogation on admit.  - Holding coumadin for cath. Now on Eliquis. $9 per CM note.  - EP consulted. Now on amio 400 mg TID to help control AF rate and therefore decrease RV pacing. He did not think PVC burden was very high. Recommended restarting BB when able. Pt is not interested in AVN.  - Discussed with EP NP today - he is going in and out of BiV pacing and trigger pacing due to elevated AF rate. Will ask Dr Haroldine Laws about timing for BB.   4. AKI on CKD III  - Baseline Unclear. Has ranged from 1.4  - 1.9 in the past several years.  - Likely due to cardiorenal syndrome  - Cr 1.86 -> 1.70 -> 1.55 -> 1.44 -> 1.43-> 1.53 -> 1.52 -> 1.59  5. Seizure Disorder - Reported in chart. This is distant. He has not had a seizure since his 20-30s. He is not on medication. Reports having several "grand-mal" seizures with no clear etiology in his youth. No change.   6. Abdominal pain - Work up per pt report negative. CT of the abdomen and pelvis, abdominal ultrasound and CCK HIDA scan all done 10/2018 with no acute findings in the abdomen, no evidence of cholecystitis and noted stable pancreatic body cyst. - Sees [facility]. On Levsin for cramping/diarrhea.  - Improved with diuresis and inotrope support. Suspect mostly related to low output CHF. No change.   6. Hypokalemia - K 4.1 today.   7. Goals of Care - Remains partial code. He wishes to think about CPR more and discuss with his wife.  - Palliative Care team following for [place] - He does NOT want LVAD consideration. Would consider home milrinone if needed.  - ICD tahcytherapies now off.   Wants TOC for meds. RD consulted for lots of diet questions. Will order [facility] RN per pt request. He has HF f/u.   Medication concerns reviewed with patient and pharmacy team. Barriers identified: None at this time.    Length of Stay: Lillian, NP  11/30/2018, 9:47 AM  Advanced Heart Failure Team Pager [phone] (M-F; 7a - 4p)  Please contact [facility] for night-coverage after hours (4p -7a ) and weekends on [email or web address]  Patient seen and examined with the above-signed Advanced Practice Provider and/or Housestaff. I personally reviewed laboratory data, imaging studies and relevant notes. I independently examined the patient and formulated the important aspects of the plan. I have edited the note to reflect any of my changes or salient points. I have  personally discussed the plan with the patient and/or family.  Cath yesterday with  non-obstructive CAD and elevated filling pressures. EF 10%. Reviewed with him and his wife. He was seen by Dr. Caryl Comes yesterday and amio added back to try and help better control AF rate and improve effectiveness of BIV pacing. ICD shock therapies were deactivated. CVP markedly elevated but co-ox preserved. Will resume IV lasix. Hopefully we can get him home without milrinone but he would be willing to try home milrinone if needed.   Glori Bickers, MD  4:20 PM

## 2018-11-30 NOTE — Consult Note (Addendum)
   [facility] CM Inpatient Consult   11/30/2018  [name] 12-29-1945 [id number]    Patient screened for potential [facility] Care Management program services due to unplanned medium risk.   Went to bedside to speak with [name] and wife about [place] Management program. He is agreeable and [facility] Care Management written consent obtained. [facility] folder provided.  [name] has an extensive cardiac history. He is agreeable to [place] follow up. Confirms Primary Care MD is Dr. Nona Dell.   Explained [facility] Care Management will not interfere or replace services provided by home health.   [name] denies any transportation concerns. States he will have some medication changes. However, [name] states he will let the [place] aware if he needs a [place] referral.  Made inpatient RNCM aware [place] Management will follow post hospital discharge.  Will make referral to [facility] for CHF and complex case management.   Addendum 1630: Spoke with inpatient RNCM. [name] will have follow up with [facility]. Spoke with Cheri from [facility]. She states [facility] will visit patient post hospital discharge to discuss and explain home based palliative care services. Writer will still make referral to [facility] for follow up since it is uncertain whether or not [name] will enroll with [facility].    Marthenia Rolling, MSN-Ed, RN,BSN [facility] Liaison [phone]

## 2018-11-30 NOTE — Care Management Note (Addendum)
Case Management Note  Patient Details  Name: [name] MRN: [id number] Date of Birth: Jul 26, 1946  Subjective/Objective:  Pt presented for Acute on Chronic Systolic Heart Failure. PTA from home with support of family.                 Action/Plan: Patient is considering home milrinone. CM did speak with patient regarding choice for [place]. Agency List provided. Patient to look over list to see who they want to use. CM spoke with patient regarding Palliative Services. Agreeable to have [facility] contact patient in the home to discuss palliative care. Patient enrolled into [facility]. CM will continue to monitor for transition of care needs.   Expected Discharge Date:                  Expected Discharge Plan:  [place]  In-House Referral:  Hospice / Palliative Care  Discharge planning Services  CM Consult  Post Acute Care Choice:  Home Health, Durable Medical Equipment Choice offered to:  Patient  DME Arranged:  Hospital bed, 3n1 DME Agency:   [place] Arranged:  RN, Disease Management [place] Agency:    [facility]  Status of Service: Completed  If discussed at [place] of Stay Meetings, dates discussed:    Additional Comments: [phone] 12-03-18 Jacqlyn Krauss, RN,BSN [phone] CM spoke with [facility] and patient's Milrinone cost will be supported via the Heart Failure Clinic. Patient is agreeable to services with [place] via [facility] and DME Hospital Bed and 3n1. Referral made to [facility] with [facility] for DME needs. Awaiting tunneled cath placement for home Milrinone. CM will continue to monitor for any other transitional needs.    1433 12-01-18 Jacqlyn Krauss, RN,BSN [phone]  [facility] has spoken to patient regarding cost of Milrinone $11.89 per day. Patient will have difficulty paying this cost. Crystal with Palliative is following the patient. CM did make Crystal aware of cost as well. CM will continue to monitor for  transition of care needs.   1145 12-01-18 Jacqlyn Krauss, RN,BSN [phone] Patient still unsure if he will need a Therapist, sports for [place]. Unable to make agency choice at this time. Patient discussed with me that he does not know if he can afford home Milrinone. CM did call Pam with [facility] regarding this to see if the cost can be checked with insurance-awaiting call back. Once stable patient will need an order for [place] 1-14-1-20 Jacqlyn Krauss, RN, BSN [phone] CM spoke with patient again regarding [facility]. Pam liaison for [facility] to speak with patient regarding Milrinone. CM will continue to monitor for transition of care needs.  Bethena Roys, RN 11/30/2018, 2:49 PM

## 2018-12-01 LAB — HEPATIC FUNCTION PANEL
ALT: 27 U/L (ref 0–44)
AST: 31 U/L (ref 15–41)
Albumin: 3.3 g/dL — ABNORMAL LOW (ref 3.5–5.0)
Alkaline Phosphatase: 92 U/L (ref 38–126)
BILIRUBIN INDIRECT: 1.7 mg/dL — AB (ref 0.3–0.9)
Bilirubin, Direct: 0.7 mg/dL — ABNORMAL HIGH (ref 0.0–0.2)
Total Bilirubin: 2.4 mg/dL — ABNORMAL HIGH (ref 0.3–1.2)
Total Protein: 7.5 g/dL (ref 6.5–8.1)

## 2018-12-01 LAB — BASIC METABOLIC PANEL
Anion gap: 9 (ref 5–15)
BUN: 26 mg/dL — ABNORMAL HIGH (ref 8–23)
CO2: 28 mmol/L (ref 22–32)
Calcium: 9 mg/dL (ref 8.9–10.3)
Chloride: 95 mmol/L — ABNORMAL LOW (ref 98–111)
Creatinine, Ser: 1.93 mg/dL — ABNORMAL HIGH (ref 0.61–1.24)
GFR calc Af Amer: 39 mL/min — ABNORMAL LOW (ref >60)
GFR calc non Af Amer: 33 mL/min — ABNORMAL LOW (ref >60)
Glucose, Bld: 121 mg/dL — ABNORMAL HIGH (ref 70–99)
Potassium: 4.2 mmol/L (ref 3.5–5.1)
SODIUM: 132 mmol/L — AB (ref 135–145)

## 2018-12-01 LAB — CBC
HCT: 35.8 % — ABNORMAL LOW (ref 39.0–52.0)
Hemoglobin: 11.9 g/dL — ABNORMAL LOW (ref 13.0–17.0)
MCH: 32.7 pg (ref 26.0–34.0)
MCHC: 33.2 g/dL (ref 30.0–36.0)
MCV: 98.4 fL (ref 80.0–100.0)
Platelets: 255 10*3/uL (ref 150–400)
RBC: 3.64 MIL/uL — ABNORMAL LOW (ref 4.22–5.81)
RDW: 14.8 % (ref 11.5–15.5)
WBC: 7.7 10*3/uL (ref 4.0–10.5)
nRBC: 0 % (ref 0.0–0.2)

## 2018-12-01 LAB — COOXEMETRY PANEL
Carboxyhemoglobin: 1.5 % (ref 0.5–1.5)
Carboxyhemoglobin: 1.2 % (ref 0.5–1.5)
METHEMOGLOBIN: 1.5 % (ref 0.0–1.5)
Methemoglobin: 1 % (ref 0.0–1.5)
O2 Saturation: 53.5 %
O2 Saturation: 45.2 %
Total hemoglobin: 12.2 g/dL (ref 12.0–16.0)
Total hemoglobin: 12.9 g/dL (ref 12.0–16.0)

## 2018-12-01 LAB — TSH: TSH: 2.91 u[IU]/mL (ref 0.350–4.500)

## 2018-12-01 MED ORDER — MILRINONE LACTATE IN DEXTROSE 20-5 MG/100ML-% IV SOLN
0.1250 ug/kg/min | INTRAVENOUS | Status: DC
  Administered 2018-12-01 – 2018-12-02 (×2): 0.125 ug/kg/min via INTRAVENOUS
  Filled 2018-12-01 (×2): qty 100

## 2018-12-01 MED ORDER — FUROSEMIDE 10 MG/ML IJ SOLN
80.0000 mg | Freq: Two times a day (BID) | INTRAMUSCULAR | Status: DC
  Administered 2018-12-01: 80 mg via INTRAVENOUS
  Filled 2018-12-01: qty 8

## 2018-12-01 NOTE — Progress Notes (Addendum)
Advanced Heart Failure Rounding Note  PCP-Cardiologist: No primary care provider on file.   Subjective:    Admitted from [facility] clinic 11/22/2018 with concerns for low output with EF down to 15% from 35-40. CHF team consulted. Initial mixed venous saturation 50%. Started on milrinone 0.25 mcg/kg/min on 1/8  Milrinone stopped 1/10. Co-ox 61%. Had [facility] 1/14 (see below).   EP consulted. On amio 400 mg TID to help decrease AF rate and therefore decrease RV pacing.   Diuresed with 80 mg IV lasix BID yesterday. Weight down 4 lbs overnight. CVP 10-11. Creatinine up to 1.93 today.   Denies CP and SOB. Had recurrent abdominal pain overnight. Says he does not know if he could afford home milrinone.   R/LHC 11/29/2018:  Mid RCA to Dist RCA lesion is 70% stenosed.  Prox RCA to Mid RCA lesion is 60% stenosed.  Ost LM to Mid LM lesion is 20% stenosed.  Ost 1st Mrg to 1st Mrg lesion is 60% stenosed.  Prox LAD lesion is 30% stenosed. Findings: Ao = 105/65 (82) LV =  106/24 RA =  5 RV = 52/6 PA = 56/25 (37) PCW = 28 (v waves to 35) Fick cardiac output/index = 4.3/2.3 PVR = 2.0 WU FA sat = 99% PA sat = 58%, 62% Assessment: 1. Mild non-obstructive CAD 2. Severe NICM with EF 10% 3. Moderately elevated L-sided filling pressures with moderately decreased CO  Echo 11/19/2018 LVEF 15%, Mild AI, Mild MR, Severe LAE, RV mildly dilated, moderately reduced, Mod RAE,  (This is significantly decreased from Echo 12/12/2015 with EF 35-40% with mild RV dilation, normal function at that time)  Objective:   Weight Range: 69.9 kg Body mass index is 23.24 kg/m.   Vital Signs:   Temp:  [97.6 F (36.4 C)-98.2 F (36.8 C)] 98.1 F (36.7 C) (01/15 0504) Pulse Rate:  [82-96] 85 (01/15 0504) Resp:  [21] 21 (01/14 1407) BP: (110-114)/(71-85) 110/71 (01/15 0504) SpO2:  [96 %-99 %] 96 % (01/15 0504) Weight:  [69.9 kg] 69.9 kg (01/15 0504) Last BM Date: 11/28/18  Weight change: Filed Weights   11/29/18 0347 11/30/18 0446 12/01/18 0504  Weight: 72.3 kg 71.8 kg 69.9 kg   Intake/Output:   Intake/Output Summary (Last 24 hours) at 12/01/2018 0928 Last data filed at 12/01/2018 0200 Gross per 24 hour  Intake 1083 ml  Output 2555 ml  Net -1472 ml    Physical Exam   General: Well appearing. No resp difficulty. Walking around in room.  HEENT: Normal Neck: Supple. JVP to jaw. Carotids 2+ bilat; no bruits. No thyromegaly or nodule noted. Cor: PMI laterally displaced. RRR, No M/G/R noted Lungs: CTAB, normal effort. Abdomen: Soft, non-tender, non-distended, no HSM. No bruits or masses. +BS  Extremities: No cyanosis, clubbing, or rash. R and LLE 1-2+ edema. BLE TED hose on. RUE PICC.  Neuro: Alert & orientedx3, cranial nerves grossly intact. moves all 4 extremities w/o difficulty. Affect pleasant  Telemetry   HR 90s. Continues to go in and out of BiV pacing and trigger pacing due to elevated AF rate. Personally reviewed.   EKG    No new tracings.   Labs    CBC Recent Labs    11/30/18 0525 12/01/18 0507  WBC 7.2 7.7  HGB 11.7* 11.9*  HCT 36.0* 35.8*  MCV 98.9 98.4  PLT 247 151   Basic Metabolic Panel Recent Labs    11/30/18 0525 12/01/18 0507  NA 131* 132*  K 4.1 4.2  CL  96* 95*  CO2 24 28  GLUCOSE 120* 121*  BUN 21 26*  CREATININE 1.59* 1.93*  CALCIUM 8.7* 9.0   Liver Function Tests Recent Labs    12/01/18 0507  AST 31  ALT 27  ALKPHOS 92  BILITOT 2.4*  PROT 7.5  ALBUMIN 3.3*   No results for input(s): LIPASE, AMYLASE in the last 72 hours. Cardiac Enzymes No results for input(s): CKTOTAL, CKMB, CKMBINDEX, TROPONINI in the last 72 hours.  BNP: BNP (last 3 results) No results for input(s): BNP in the last 8760 hours.  ProBNP (last 3 results) Recent Labs    11/15/18 1001  PROBNP 1,353.0*     D-Dimer No results for input(s): DDIMER in the last 72 hours. Hemoglobin A1C No results for input(s): HGBA1C in the last 72 hours. Fasting Lipid  Panel No results for input(s): CHOL, HDL, LDLCALC, TRIG, CHOLHDL, LDLDIRECT in the last 72 hours. Thyroid Function Tests Recent Labs    12/01/18 0507  TSH 2.910    Other results:   Imaging    No results found.   Medications:     Scheduled Medications: . allopurinol  100 mg Oral Daily  . amiodarone  400 mg Oral Q8H  . apixaban  5 mg Oral BID  . digoxin  0.0625 mg Oral Daily  . fluticasone  1 spray Each Nare Daily  . hydrALAZINE  25 mg Oral BID  . isosorbide mononitrate  30 mg Oral Daily  . montelukast  10 mg Oral QHS  . multivitamin with minerals  1 tablet Oral Daily  . pantoprazole  40 mg Oral Daily  . sodium chloride flush  10-40 mL Intracatheter Q12H  . sodium chloride flush  3 mL Intravenous Q12H  . spironolactone  25 mg Oral Daily  . vitamin C  500 mg Oral Daily    Infusions: . sodium chloride      PRN Medications: sodium chloride, acetaminophen, diphenhydrAMINE, guaiFENesin-dextromethorphan, loperamide, nitroGLYCERIN, ondansetron (ZOFRAN) IV, polyethylene glycol, simethicone, sodium chloride, sodium chloride flush, sodium chloride flush, traMADol    Patient Profile   [name] UHDE is a 73 y.o. male  with h/o CAD (s/p DES to LCx '03, residual total occlusion of RCA w/ collaterals, normal myoview 2012), ICM s/p Medtronic BiV ICD (upgrade in 2017), h/o Afib/flutter, HTN, HLD, CKD, and seizure disorder.   Admitted from [facility] clinic 11/22/2018 with concerns for low output CHF.   Assessment/Plan   1. Acute on chronic systolic CHF s/p BiV Medtronic ICD with low output - Echo as above with EF down to 15%, previously 35-40%.  - Co-ox and clinical status much improved with milrinone (Initial coox 50%). Milrinone stopped 1/10 - Volume remains elevated. CVP 10-11. Creatinine 1.5 -> 1.9 - Coox 54% this am off milrinone. Recheck. I think he will likely need milrinone. He is okay with this as long as it is affordable. Will discuss with Carolynn Sayers. If coox remains  low, will plan to place tunneled line for tomorrow (will hold Eliquis tonight and tomorrow) - Continue hydralazine 25 mg TID and Imdur 30 daily. SBP 110s.  - Lisinopril stopped in 2018 due to AKI. Has ARBs listed as allergy. So no ACE/ARB/ARNI  - Continue spiro to 25 mg daily. (Had Ks in the 5.2 - 5.4 range recently as outpatient, so will need to follow closely). K 4.2 today. - Continue digoxin 0.0625 mg daily. Level stable at 0.3 11/23/2018 - Not a candidate for transplant and would not want LVAD. Says he would consider  home milrinone if he needs it to maintain QOL  - Dr Caryl Comes saw yesterday regarding PVCs and AF, thought to be contributing to worsening HF. See discussion below.  - Hold off on BB with low coox.  - Consult RD to help with diet education. HF [facility] RN ordered for DC.   2. CAD s/p PCI - He has occasional non-specific left chest pain, and a pain in his mid back that may be due to volume overload vs anginal equivalent.  - CFX PCI in 2003, CTO of RCA with collaterals. - Myoview April 2012-no ischemia,inferolateral scar - LHC 1/13 showed mild non obstructive CAD.   3. Permanent A-fib  - Chronic by ICD interrogation 11/23/2018 - BiV paced ~ 90% of the time by interrogation on admit.  - Now on Eliquis. $9 per CM note.  - EP consulted. Now on amio 400 mg TID to help control AF rate and therefore decrease RV pacing. He did not think PVC burden was very high. Recommended restarting BB when able. Pt is not interested in AVN.  - On tele he is going in and out of BiV pacing and trigger pacing due to elevated AF rate. No BB yet with low coox.   4. AKI on CKD III  - Baseline Unclear. Has ranged from 1.4 - 1.9 in the past several years.  - Likely due to cardiorenal syndrome  - Cr 1.86 -> 1.70 -> 1.55 -> 1.44 -> 1.43-> 1.53 -> 1.52 -> 1.59 -> 1.93. Hold diuretics today.   5. Seizure Disorder - Reported in chart. This is distant. He has not had a seizure since his 20-30s. He is not on  medication. Reports having several "grand-mal" seizures with no clear etiology in his youth. No change.   6. Abdominal pain - Work up per pt report negative. CT of the abdomen and pelvis, abdominal ultrasound and CCK HIDA scan all done 10/2018 with no acute findings in the abdomen, no evidence of cholecystitis and noted stable pancreatic body cyst. - Sees  GI. On Levsin for cramping/diarrhea.  - Improved with diuresis and inotrope support. Suspect mostly related to low output CHF. No change.  6. Hypokalemia - K 4.2  7. Goals of Care - Remains partial code. No CPR. - Palliative Care team following for [place] - He does NOT want LVAD consideration. Would consider home milrinone if needed.  - ICD tachy therapies now off. No change.   Wants TOC for meds when ready for DC.  Likely will need milrinone at DC. Will recheck coox. If remains low, will restart milrinone. He still needs diuresis. Will plan for tunneled line tomorrow in IR for home milrinone. Will order once repeat coox results.   Medication concerns reviewed with patient and pharmacy team. Barriers identified: None at this time.   Length of Stay: [place], NP  12/01/2018, 9:28 AM  Advanced Heart Failure Team Pager [phone] (M-F; 7a - 4p)  Please contact [facility] for night-coverage after hours (4p -7a ) and weekends on [email or web address]   Patient seen and examined with the above-signed Advanced Practice Provider and/or Housestaff. I personally reviewed laboratory data, imaging studies and relevant notes. I independently examined the patient and formulated the important aspects of the plan. I have edited the note to reflect any of my changes or salient points. I have personally discussed the plan with the patient and/or family.  He is worse today. Co-ox way down. Volume up and creatinine worse, all suggestive of recurrent  cardiogenic shock. Will restart milrinone. Will work with [facility] to see if he can afford home  milrinone. If so, will have IR place tunneled PICC tomorrow. Hold Eliquis tonight and in am. Discussed dosing with PharmD personally.  Glori Bickers, MD  1:31 PM

## 2018-12-01 NOTE — Care Management Important Message (Signed)
Important Message  Patient Details  Name: [name] MRN: [id number] Date of Birth: 04-May-1946   Medicare Important Message Given:  Yes    [name] 12/01/2018, 10:53 AM

## 2018-12-02 LAB — COOXEMETRY PANEL
Carboxyhemoglobin: 1.4 % (ref 0.5–1.5)
Carboxyhemoglobin: 1.6 % — ABNORMAL HIGH (ref 0.5–1.5)
Methemoglobin: 1.6 % — ABNORMAL HIGH (ref 0.0–1.5)
Methemoglobin: 1 % (ref 0.0–1.5)
O2 Saturation: 84.9 %
O2 Saturation: 56 %
TOTAL HEMOGLOBIN: 11.5 g/dL — AB (ref 12.0–16.0)
Total hemoglobin: 11.9 g/dL — ABNORMAL LOW (ref 12.0–16.0)

## 2018-12-02 LAB — BASIC METABOLIC PANEL
Anion gap: 11 (ref 5–15)
BUN: 32 mg/dL — ABNORMAL HIGH (ref 8–23)
CHLORIDE: 93 mmol/L — AB (ref 98–111)
CO2: 27 mmol/L (ref 22–32)
Calcium: 9 mg/dL (ref 8.9–10.3)
Creatinine, Ser: 2.06 mg/dL — ABNORMAL HIGH (ref 0.61–1.24)
GFR calc Af Amer: 36 mL/min — ABNORMAL LOW (ref >60)
GFR calc non Af Amer: 31 mL/min — ABNORMAL LOW (ref >60)
Glucose, Bld: 115 mg/dL — ABNORMAL HIGH (ref 70–99)
Potassium: 4.3 mmol/L (ref 3.5–5.1)
Sodium: 131 mmol/L — ABNORMAL LOW (ref 135–145)

## 2018-12-02 LAB — CBC
HEMATOCRIT: 34.3 % — AB (ref 39.0–52.0)
Hemoglobin: 11.4 g/dL — ABNORMAL LOW (ref 13.0–17.0)
MCH: 32.6 pg (ref 26.0–34.0)
MCHC: 33.2 g/dL (ref 30.0–36.0)
MCV: 98 fL (ref 80.0–100.0)
Platelets: 247 10*3/uL (ref 150–400)
RBC: 3.5 MIL/uL — ABNORMAL LOW (ref 4.22–5.81)
RDW: 14.8 % (ref 11.5–15.5)
WBC: 6.1 10*3/uL (ref 4.0–10.5)
nRBC: 0 % (ref 0.0–0.2)

## 2018-12-02 MED ORDER — MILRINONE LACTATE IN DEXTROSE 20-5 MG/100ML-% IV SOLN
0.2500 ug/kg/min | INTRAVENOUS | Status: DC
  Administered 2018-12-03: 0.25 ug/kg/min via INTRAVENOUS
  Filled 2018-12-02: qty 100

## 2018-12-02 NOTE — Plan of Care (Signed)
  Problem: Education: Goal: Ability to verbalize understanding of medication therapies will improve Outcome: Progressing   

## 2018-12-02 NOTE — Progress Notes (Signed)
Home Paraenteral Inotropic Therapy : Data Collection Form  Patients name: [name]   Date: 12/02/18  Information below may not be completed by the supplier nor anyone in a Financial relationship with the supplier.  1. Results of invasive hemodynamic monitoring  Cardiac Index Before Inotrope infusion:            1.49               On Inotrope infusion:            1.8               Drug and dose:   Milrinone @ 0.125 mcg/kg/min  2. Cardiac medications immediately prior to inotrope infusion (List name, dose, and frequency) Amiodarone 400 mg TID Eliquis 5 mg BID Digoxin 0.026 mg daily Lasix 80 mg IV BID Hydralazine 25 mg BID Imdur 30 mg daily Spironolactone 25 mg daily  3. Dose this represent maximum tolerated doses of these medications? Yes.   4. Breathing status Prior to inotrope infusion: Dyspnea at rest  At time of discharge: Dyspnea on moderate exertion.   5. Initial home prescription Drug and Dose:   Milrinone 0.125 mcg/kg/min for continuous infusion 24/hr day and 7 days/week  6. If continuous infusion is prescribed, have attempts to discontinue inotrope infusion in the hospital failed?   Yes.   7. If intermittent infusion is prescribed, have there been repeated hospitalizations for heart failure which Parenteral inotrope were required? Not applicable.   8. Is patient capable of going to the physician for outpatient evaluation? Yes.   9. Is routine electrocardiographic monitoring required in the Home?  No.   The above statements and any additional explanations included separately are true and accurate and there is documentation present in the patients medical record to support these statements.   Completed by [name], NP   In instances where this form was completed by an Advanced Practice Provider, please see EMR for physician Co-Signature.

## 2018-12-02 NOTE — Progress Notes (Signed)
Daily Progress Note   Patient Name: [name]       Date: 12/02/2018 DOB: March 10, 1946  Age: 73 y.o. MRN#: [id number] Attending Physician: Larey Dresser, MD Primary Care Physician: Nona Dell, Corene Cornea, MD Admit Date: 11/22/2018  Reason for Consultation/Follow-up: Establishing goals of care For [place] Site conversation, please see progress note from 1/9.  Subjective: In to see [name]. Wife is at bedside. He states he cannot afford the Milrinone. He states in addition to this, he is concerned with his quality of life on Milrinone. He states he does not want to have to go to the doctors or hospital to regulate it, and when his numbers change, he has swelling and pain in his extremities.  He decided he would want to focus on comfort and elects to go home with hospice today.   Upon getting ready to leave visit, CM came in to speak with patient and [place] was present outside door. They state they are hopeful to reduce the price of the medication or provide it at no cost to him.   He states he wants to be able to get out of the house and go to a store or walk around outside the house to have quality of life. Advanced to check on pricing definitively, and family to discuss quality of life and how he would like to proceed and discuss with cardiology quality of life expected on Milrinone.    MOST form signed by patient in chart. Cardiopulmonary Resuscitation: Do Not Attempt Resuscitation (DNR/No CPR)  Medical Interventions: Full Scope of Treatment: Use intubation, advanced airway interventions, mechanical ventilation, cardioversion as indicated, medical treatment, IV fluids, etc, also provide comfort measures. Transfer to the hospital if indicated  Antibiotics: Antibiotics if indicated  IV Fluids:  IV fluids if indicated  Feeding Tube: No feeding tube     Length of Stay: 9  Current Medications: Scheduled Meds:  . allopurinol  100 mg Oral Daily  . amiodarone  400 mg Oral Q8H  . fluticasone  1 spray Each Nare Daily  . hydrALAZINE  25 mg Oral BID  . isosorbide mononitrate  30 mg Oral Daily  . montelukast  10 mg Oral QHS  . multivitamin with minerals  1 tablet Oral Daily  . pantoprazole  40 mg Oral Daily  . sodium  chloride flush  10-40 mL Intracatheter Q12H  . sodium chloride flush  3 mL Intravenous Q12H  . spironolactone  25 mg Oral Daily  . vitamin C  500 mg Oral Daily    Continuous Infusions: . sodium chloride 250 mL (12/01/18 1216)  . milrinone 0.125 mcg/kg/min (12/01/18 1216)    PRN Meds: sodium chloride, acetaminophen, diphenhydrAMINE, guaiFENesin-dextromethorphan, loperamide, nitroGLYCERIN, ondansetron (ZOFRAN) IV, polyethylene glycol, simethicone, sodium chloride, sodium chloride flush, sodium chloride flush, traMADol  Physical Exam Constitutional:      Appearance: Normal appearance.  Pulmonary:     Effort: Pulmonary effort is normal.  Skin:    General: Skin is warm and dry.             Vital Signs: BP 117/68 (BP Location: Left Arm)   Pulse 78   Temp 97.6 F (36.4 C) (Oral)   Resp 17   Ht 5' 8.25" (1.734 m)   Wt 70.3 kg   SpO2 99%   BMI 23.38 kg/m  SpO2: SpO2: 99 % O2 Device: O2 Device: Room Air O2 Flow Rate: O2 Flow Rate (L/min): 2 L/min  Intake/output summary:   Intake/Output Summary (Last 24 hours) at 12/02/2018 1023 Last data filed at 12/02/2018 0755 Gross per 24 hour  Intake 1646.98 ml  Output 1425 ml  Net 221.98 ml   LBM: Last BM Date: 11/28/18 Baseline Weight: Weight: 75.8 kg(scale c) Most recent weight: Weight: 70.3 kg       Palliative Assessment/Data: 40%    Flowsheet Rows     Most Recent Value  Intake Tab  Referral Department  Cardiology  Unit at Time of Referral  Cardiac/Telemetry Unit  Palliative Care Primary Diagnosis   Cardiac  Date Notified  11/23/18  Palliative Care Type  New Palliative care  Reason for referral  Clarify Goals of Care  Date of Admission  11/23/18  Date first seen by Palliative Care  11/24/18  # of days Palliative referral response time  1 Day(s)  # of days IP prior to Palliative referral  0  Clinical Assessment  Psychosocial & Spiritual Assessment  Palliative Care Outcomes      Patient Active Problem List   Diagnosis Date Noted  . Essential hypertension 12/14/2017  . CAD S/P percutaneous coronary angioplasty 05/27/2017  . CKD (chronic kidney disease), stage III ([place]) 05/27/2017  . Encounter for therapeutic drug monitoring 02/22/2014  . NSVT (nonsustained ventricular tachycardia) ([place]) 09/19/2012  . Transaminitis 09/10/2012  . Encounter for long-term (current) use of anticoagulants 06/04/2011  . ICD BiV  medtronic   . Ischemic cardiomyopathy   . Acute on chronic systolic (congestive) heart failure ([place]) 09/04/2009  . UNSPECIFIED ANEMIA 05/14/2009  . PAF (paroxysmal atrial fibrillation) ([place]) 01/31/2009    Palliative Care Assessment & Plan    Recommendations/Plan:  MOST form in chart.  Will continue to follow.   Waiting to determine if he can afford Milrinone and if using the medication would fit is quality of life.    Code Status:    Code Status Orders  (From admission, onward)         Start     Ordered   11/23/18 1118  Limited resuscitation (code)  Continuous    Question Answer Comment  In the event of cardiac or respiratory ARREST: Initiate Code Blue, Call Rapid Response Yes   In the event of cardiac or respiratory ARREST: Perform CPR Yes   In the event of cardiac or respiratory ARREST: Perform Intubation/Mechanical Ventilation No   In the  event of cardiac or respiratory ARREST: Use NIPPV/BiPAp only if indicated Yes   In the event of cardiac or respiratory ARREST: Administer ACLS medications if indicated Yes   In the event of cardiac or respiratory  ARREST: Perform Defibrillation or Cardioversion if indicated Yes      11/23/18 1117        Code Status History    Date Active Date Inactive Code Status Order ID Comments User Context   11/22/2018 1223 11/23/2018 1117 Full Code [id number]  Ilean China Inpatient   09/10/2012 2050 09/14/2012 1710 Full Code [id number]  Ruben Gottron, RN ED    Advance Directive Documentation     Most Recent Value  Type of Advance Directive  Healthcare Power of [place], Living will  Pre-existing out of facility DNR order (yellow form or pink MOST form)  -  "MOST" Form in Place?  -       Prognosis:   Poor longterm.  Discharge Planning:  To Be Determined   Thank you for allowing the Palliative Medicine Team to assist in the care of this patient.   Total Time 9:30- 10:35 min Prolonged Time Billed  no      Greater than 50%  of this time was spent counseling and coordinating care related to the above assessment and plan.  Asencion Gowda, NP  Please contact Palliative Medicine Team phone at [phone] for questions and concerns.

## 2018-12-02 NOTE — Discharge Summary (Addendum)
Advanced Heart Failure Discharge Note  Discharge Summary   Patient ID: [name] MRN: [id number], DOB/AGE: 1946-10-19 73 y.o. Admit date: 11/22/2018 D/C date:     12/03/2018    Primary Discharge Diagnoses:  1. A/C systolic HF, Class IV - BiV Medtronic ICD - Home milrinone 0.125 mcg/kg/min followed by [facility]. - Not interested in LVAD 2. CAD  3. Permanent afib - On Eliquis. EP started amio 400 mg TID to help control AF rate and improve BiV pacing - Pt uninterested in AVN abalation 4. AKI on CKD III 5. Seizure disorder 6. Abdominal pain - Related to low output 7. Hypokalemia 8. Goals of care - ICD off. He is a partial code. No CPR or shocks.    Hospital Course:  [name] is a 73 y.o. malewith h/o CAD(s/p DES to LCx '03, residual total occlusion of RCA w/ collaterals, normal myoview 2012),ICM s/p Medtronic BiV ICD (upgrade in 2017), h/o Afib/flutter, HTN, HLD, CKD, and seizure disorder.   Admitted from [facility] clinic on 11/22/18 with concerns for low output. Echo completed outpatient showed reduced EF to 15% from 35-40% in 2017. HF team was consulted. PICC line was placed. Diuresed with IV lasix. Coox was low at 50% so he was started on milrinone 0.25 on 1/7 with improvement. Milrinone slowly weaned to off on 1/10. He originally did well.   Underwent R/LHC on 1/13, which showed mild nonobstructive CAD, NICM with EF 10%, moderately elevated left sided filling pressures, and moderately decreased CO.   EP was consulted with only 90% BiV pacing. He was started on amio 400 mg TID to help decrease AF rate and increase BiV function. He was switched from coumadin to Eliquis post cath.   Coox dropped again down to 45% off milrinine. Milrinone was restarted with improvement in coox and symptoms. Tunneled line placed and he will be followed by [facility] for home milrinone.   Course complicated by AKI on CKD3. This improved with diuresis and addition of milrinone. He required a dose of IV lasix  on day of discharge with mild volume overload after holding diuretics. He also had abdominal pain. Thought to be a symptom of low output for him with improvement on milrinone support.   Palliative was consulted. He decided to be a partial code with no CPR or shocks. ICD tachy-therapies were turned off. If milrinone does not improve his QOL, he is interested in transitioning to hospice.  Medications provided through [facility]. His first month of milrinone will be covered by HF fund. [facility] and HF CSW working on getting his copays waived through [facility].  He will be discharged to home with [facility] following for home milrinone. He will be followed closely in HF clinic, with appointment as below.   Discharge Weight Range: 155 lbs.  Discharge Vitals: Blood pressure 105/66, pulse (!) 101, temperature 98.1 F (36.7 C), temperature source Oral, resp. rate 18, height 5' 8.25" (1.734 m), weight 70.5 kg, SpO2 98 %.  Labs: Lab Results  Component Value Date   WBC 5.7 12/03/2018   HGB 10.8 (L) 12/03/2018   HCT 32.2 (L) 12/03/2018   MCV 98.5 12/03/2018   PLT 260 12/03/2018    Recent Labs  Lab 12/01/18 0507  12/03/18 0509  NA 132*   < > 131*  K 4.2   < > 4.1  CL 95*   < > 95*  CO2 28   < > 28  BUN 26*   < > 28*  CREATININE  1.93*   < > 1.95*  CALCIUM 9.0   < > 8.8*  PROT 7.5  --   --   BILITOT 2.4*  --   --   ALKPHOS 92  --   --   ALT 27  --   --   AST 31  --   --   GLUCOSE 121*   < > 111*   < > = values in this interval not displayed.   Lab Results  Component Value Date   CHOL 169 09/13/2010   HDL 29 (L) 09/13/2010   LDLCALC 125 (H) 09/13/2010   TRIG 77 09/13/2010   BNP (last 3 results) No results for input(s): BNP in the last 8760 hours.  ProBNP (last 3 results) Recent Labs    11/15/18 1001  PROBNP 1,353.0*     Diagnostic Studies/Procedures   R/LHC 11/29/2018:  Mid RCA to Dist RCA lesion is 70% stenosed.  Prox RCA to Mid RCA lesion is 60% stenosed.  Ost LM to Mid LM  lesion is 20% stenosed.  Ost 1st Mrg to 1st Mrg lesion is 60% stenosed.  Prox LAD lesion is 30% stenosed. Findings: Ao = 105/65 (82) LV = 106/24 RA = 5 RV = 52/6 PA = 56/25 (37) PCW = 28 (v waves to 35) Fick cardiac output/index = 4.3/2.3 PVR = 2.0 WU FA sat = 99% PA sat = 58%, 62% Assessment: 1. Mild non-obstructive CAD 2. Severe NICM with EF 10% 3. Moderately elevated L-sided filling pressures with moderately decreased CO  Discharge Medications   Allergies as of 12/03/2018      Reactions   Bentyl [dicyclomine Hcl] Other (See Comments)   Blisters in mouth, lips swelling    Sulfa Antibiotics Shortness Of Breath   Ace Inhibitors    Unknown   Corzide [nadolol-bendroflumethiazide]    Unknown   Diovan [valsartan]    Unknown   Flagyl [metronidazole] Other (See Comments)   Unknown   Omeprazole    Shut my kidneys down   Other    Perfumes smells; paint smells; formaldehyde smells   Shellfish Allergy    Tape Other (See Comments)   Amlodipine Besylate Other (See Comments)   Numbness to lips; made legs give out   Codeine Itching, Rash   Itching   Latex Itching, Rash   Norvasc [amlodipine Besylate] Other (See Comments)   Numbness to lips; made legs give out   Penicillins Rash   Childhood reaction      Medication List    STOP taking these medications   bisoprolol 5 MG tablet Commonly known as:  ZEBETA   carvedilol 20 MG 24 hr capsule Commonly known as:  COREG CR   digoxin 0.125 MG tablet Commonly known as:  LANOXIN   furosemide 40 MG tablet Commonly known as:  LASIX   hyoscyamine 0.125 MG SL tablet Commonly known as:  LEVSIN SL   warfarin 5 MG tablet Commonly known as:  COUMADIN     TAKE these medications   allopurinol 100 MG tablet Commonly known as:  ZYLOPRIM Take 100 mg by mouth daily.   amiodarone 400 MG tablet Commonly known as:  PACERONE Take 1 tablet (400 mg total) by mouth every 8 (eight) hours.   apixaban 5 MG Tabs tablet Commonly  known as:  ELIQUIS Take 1 tablet (5 mg total) by mouth 2 (two) times daily.   Calcium-Magnesium 500-250 MG Tabs Take 1 tablet by mouth daily.   diphenhydrAMINE 25 mg capsule Commonly known as:  BENADRYL Take 25 mg by mouth every 6 (six) hours as needed for allergies.   fish oil-omega-3 fatty acids 1000 MG capsule Take 2 g by mouth daily.   hydrALAZINE 25 MG tablet Commonly known as:  APRESOLINE Take 1 tablet (25 mg total) by mouth 3 (three) times daily. What changed:  when to take this   IMODIUM A-D 2 MG tablet Generic drug:  loperamide Take 2 mg by mouth as needed for diarrhea or loose stools.   isosorbide mononitrate 30 MG 24 hr tablet Commonly known as:  IMDUR Take 1 tablet (30 mg total) by mouth daily. Start taking on:  December 04, 2018   magnesium oxide 400 MG tablet Commonly known as:  MAG-OX Take 1 tablet (400 mg total) by mouth daily.   milrinone 20 MG/100 ML Soln infusion Commonly known as:  PRIMACOR Inject 0.0176 mg/min into the vein continuous.   montelukast 10 MG tablet Commonly known as:  SINGULAIR Take 10 mg by mouth daily.   multivitamin with minerals Tabs tablet Take 1 tablet by mouth daily.   nitroGLYCERIN 0.4 MG SL tablet Commonly known as:  NITROSTAT Place 0.4 mg under the tongue every 5 (five) minutes as needed for chest pain. (up to 3 doses) For chest pain   omeprazole 40 MG capsule Commonly known as:  PRILOSEC Take 1 capsule (40 mg total) by mouth every morning.   spironolactone 25 MG tablet Commonly known as:  ALDACTONE Take 1 tablet (25 mg total) by mouth daily. What changed:  See the new instructions.   torsemide 20 MG tablet Commonly known as:  DEMADEX Take 2 tablets (40 mg total) by mouth daily.   traMADol 50 MG tablet Commonly known as:  ULTRAM Take 1 tablet by mouth every 6 hours as needed for pain. What changed:    how much to take  how to take this  when to take this  reasons to take this  additional instructions             Durable Medical Equipment  (From admission, onward)         Start     Ordered   12/03/18 0950  For home use only DME Shower stool  Once     12/03/18 0950   12/03/18 0950  For home use only DME Hospital bed  Once    Question Answer Comment  The above medical condition requires: Patient requires the ability to reposition frequently   Head must be elevated greater than: 30 degrees   Bed type Semi-electric      12/03/18 0950   12/03/18 0944  Heart failure home health orders  (Heart failure home health orders / Face to face)  Once    Comments:  Heart Failure Follow-up Care:  Verify follow-up appointments per Patient Discharge Instructions. Confirm transportation arranged. Reconcile home medications with discharge medication list. Remove discontinued medications from use. Assist patient/caregiver to manage medications using pill box. Reinforce low sodium food selection Assessments: Vital signs and oxygen saturation at each visit. Assess home environment for safety concerns, caregiver support and availability of low-sodium foods. Consult Education officer, museum, PT/OT, Dietitian, and CNA based on assessments. Perform comprehensive cardiopulmonary assessment. Notify MD for any change in condition or weight gain of 3 pounds in one day or 5 pounds in one week with symptoms. Daily Weights and Symptom Monitoring:  [facility] to provide  Labs every other week to include BMET, Mg, and CBC with Diff. Additional as needed. Should be drawn via PERIPHERAL  stick. NOT PICC line.   H4174 Milrinone 0.25 mcg/kg/min X 52 weeks A4221 Supplies for maintenance of drug infusion catheter A4222 Supplies for the external drug infusion per cassette or bag [phone] Ambulatory Infusion pump  Ensure patient has access to scales. Teach patient/caregiver to weigh daily before breakfast and after voiding using same scale and record.    Teach patient/caregiver to track weight and symptoms and when to notify  Provider. Activity: Develop individualized activity plan with patient/caregiver.  Question Answer Comment  Heart Failure Follow-up Care Advanced Heart Failure (AHF) Clinic at [phone]   Obtain the following labs Basic Metabolic Panel   Lab frequency Other see comments   Fax lab results to AHF Clinic at [phone]   Diet Low Sodium Heart Healthy   Fluid restrictions: 2000 mL Fluid   Consult: Case manager   Consult: Social work   Initiate Heart Failure Clinic Diuretic Protocol to be used by [place] only ( to be ordered by Heart Failure Team Providers Only) Yes      12/03/18 0944   11/30/18 1112  Heart failure home health orders  (Heart failure home health orders / Face to face)  Once    Comments:  Heart Failure Follow-up Care:  Verify follow-up appointments per Patient Discharge Instructions. Confirm transportation arranged. Reconcile home medications with discharge medication list. Remove discontinued medications from use. Assist patient/caregiver to manage medications using pill box. Reinforce low sodium food selection Assessments: Vital signs and oxygen saturation at each visit. Assess home environment for safety concerns, caregiver support and availability of low-sodium foods. Consult Education officer, museum, PT/OT, Dietitian, and CNA based on assessments. Perform comprehensive cardiopulmonary assessment. Notify MD for any change in condition or weight gain of 3 pounds in one day or 5 pounds in one week with symptoms. Daily Weights and Symptom Monitoring:  HHRN 2 wk 2 for CP assessment   Ensure patient has access to scales. Teach patient/caregiver to weigh daily before breakfast and after voiding using same scale and record.    Teach patient/caregiver to track weight and symptoms and when to notify Provider. Activity: Develop individualized activity plan with patient/caregiver.  Question Answer Comment  Heart Failure Follow-up Care Advanced Heart [facility] (AHF)  Clinic at [phone]   Obtain the following labs Other see comments   Lab frequency Other see comments   Fax lab results to AHF Clinic at [phone]   Diet Low Sodium Heart Healthy   Fluid restrictions: 2000 mL Fluid   Initiate Heart Failure Clinic Diuretic Protocol to be used by [place] only ( to be ordered by Heart Failure Team Providers Only) Yes      11/30/18 1112          Disposition   The patient will be discharged in stable condition to home. Discharge Instructions    (HEART FAILURE PATIENTS) Call MD:  Anytime you have any of the following symptoms: 1) 3 pound weight gain in 24 hours or 5 pounds in 1 week 2) shortness of breath, with or without a dry hacking cough 3) swelling in the hands, feet or stomach 4) if you have to sleep on extra pillows at night in order to breathe.   Complete by:  As directed    Call MD for:  redness, tenderness, or signs of infection (pain, swelling, redness, odor or green/yellow discharge around incision site)   Complete by:  As directed    Call MD for:  temperature >100.4   Complete by:  As  directed    Diet - low sodium heart healthy   Complete by:  As directed    Heart Failure patients record your daily weight using the same scale at the same time of day   Complete by:  As directed    Increase activity slowly   Complete by:  As directed    STOP any activity that causes chest pain, shortness of breath, dizziness, sweating, or exessive weakness   Complete by:  As directed      Follow-up Information    [place]. Go on 12/09/2018.   Specialty:  Cardiology Why:  at 2:30 in the [facility].  Please bring all medications to appt.  Gate code is [phone] for January.   Contact information: [address] [id number] [place] Follow up.   Why:  Hospital Bed, 3n1 Contact  information: [address] [place] [address] [phone]        [facility].   Specialty:  Home Health Services Why:  Registered Nurse.  Contact information: [address] [place] [address] [phone]             Duration of Discharge Encounter: Greater than 35 minutes   Signed, [name], NP 12/03/2018, 3:58 PM  Patient seen and examined with the above-signed Advanced Practice Provider and/or Housestaff. I personally reviewed laboratory data, imaging studies and relevant notes. I independently examined the patient and formulated the important aspects of the plan. I have edited the note to reflect any of my changes or salient points. I have personally discussed the plan with the patient and/or family.  He presented with Jerlyn Ly IV HF symptoms. Improved with milrinone. He failed milrinone wean in hospital. He will be discharged with home milrinone with [facility] following. He is not interested in VAD. ICD was deactivated with Dr. Olin Pia assistance. Will follow in HF Clinic.   Glori Bickers, MD  10:39 PM

## 2018-12-02 NOTE — Progress Notes (Addendum)
Advanced Heart Failure Rounding Note  PCP-Cardiologist: No primary care provider on file.   Subjective:    Admitted from [facility] clinic 11/22/2018 with concerns for low output with EF down to 15% from 35-40. CHF team consulted. Initial mixed venous saturation 50%. Started on milrinone 0.25 mcg/kg/min on 1/8. Milrinone stopped 1/10. Had [facility] 1/14 (see below).   Milrinone restarted yesterday with coox 45%. Todays coox was 85%. Redraw pending. Pt is unsure if he can afford home milrinone (~$360/month) - HF CSW is looking into this.  Remains on amio 400 mg TID. HR improved 80-100s. Eliquis on hold for ?tunneled line for home inotropes.   Received 1 dose of 80 mg IV lasix yesterday. Sluggish UOP. Weight up 1 lb. Creatinine trending up 1.93 -> 2.06. SBP 100-110s. CVP 8-9 this morning.   He feels okay. Walking in hallways. Denies SOB or CP. He is leaning more toward hospice than home inotropes, but has had he cannot afford home inotropes.   R/LHC 11/29/2018:  Mid RCA to Dist RCA lesion is 70% stenosed.  Prox RCA to Mid RCA lesion is 60% stenosed.  Ost LM to Mid LM lesion is 20% stenosed.  Ost 1st Mrg to 1st Mrg lesion is 60% stenosed.  Prox LAD lesion is 30% stenosed. Findings: Ao = 105/65 (82) LV =  106/24 RA =  5 RV = 52/6 PA = 56/25 (37) PCW = 28 (v waves to 35) Fick cardiac output/index = 4.3/2.3 PVR = 2.0 WU FA sat = 99% PA sat = 58%, 62% Assessment: 1. Mild non-obstructive CAD 2. Severe NICM with EF 10% 3. Moderately elevated L-sided filling pressures with moderately decreased CO  Echo 11/19/2018 LVEF 15%, Mild AI, Mild MR, Severe LAE, RV mildly dilated, moderately reduced, Mod RAE,  (This is significantly decreased from Echo 12/12/2015 with EF 35-40% with mild RV dilation, normal function at that time)  Objective:   Weight Range: 70.3 kg Body mass index is 23.38 kg/m.   Vital Signs:   Temp:  [97.5 F (36.4 C)-98.4 F (36.9 C)] 97.6 F (36.4 C) (01/16 0753) Pulse  Rate:  [78-102] 78 (01/16 0753) Resp:  [16-18] 17 (01/16 0753) BP: (107-125)/(68-82) 117/68 (01/16 0753) SpO2:  [96 %-99 %] 99 % (01/16 0753) Weight:  [70.3 kg] 70.3 kg (01/16 0510) Last BM Date: 11/28/18  Weight change: Filed Weights   11/30/18 0446 12/01/18 0504 12/02/18 0510  Weight: 71.8 kg 69.9 kg 70.3 kg   Intake/Output:   Intake/Output Summary (Last 24 hours) at 12/02/2018 0829 Last data filed at 12/02/2018 0755 Gross per 24 hour  Intake 1646.98 ml  Output 1425 ml  Net 221.98 ml    Physical Exam   General: Well appearing. No resp difficulty. HEENT: Normal Neck: Supple. JVP to jaw. Carotids 2+ bilat; no bruits. No thyromegaly or nodule noted. Cor: PMI laterally displaced. RRR, No M/G/R noted Lungs: CTAB, normal effort. Abdomen: Soft, non-tender, non-distended, no HSM. No bruits or masses. +BS  Extremities: No cyanosis, clubbing, or rash. R and LLE trace edema. RUE PICC. Warm. Neuro: Alert & orientedx3, cranial nerves grossly intact. moves all 4 extremities w/o difficulty. Affect pleasant  Telemetry   HR 80-90s with Vpacing. Continues to go in and out of BiV pacing and trigger pacing due to elevated AF rate. Personally reviewed.   EKG    No new tracings.   Labs    CBC Recent Labs    12/01/18 0507 12/02/18 0628  WBC 7.7 6.1  HGB 11.9* 11.4*  HCT 35.8* 34.3*  MCV 98.4 98.0  PLT 255 400   Basic Metabolic Panel Recent Labs    12/01/18 0507 12/02/18 0628  NA 132* 131*  K 4.2 4.3  CL 95* 93*  CO2 28 27  GLUCOSE 121* 115*  BUN 26* 32*  CREATININE 1.93* 2.06*  CALCIUM 9.0 9.0   Liver Function Tests Recent Labs    12/01/18 0507  AST 31  ALT 27  ALKPHOS 92  BILITOT 2.4*  PROT 7.5  ALBUMIN 3.3*   No results for input(s): LIPASE, AMYLASE in the last 72 hours. Cardiac Enzymes No results for input(s): CKTOTAL, CKMB, CKMBINDEX, TROPONINI in the last 72 hours.  BNP: BNP (last 3 results) No results for input(s): BNP in the last 8760  hours.  ProBNP (last 3 results) Recent Labs    11/15/18 1001  PROBNP 1,353.0*     D-Dimer No results for input(s): DDIMER in the last 72 hours. Hemoglobin A1C No results for input(s): HGBA1C in the last 72 hours. Fasting Lipid Panel No results for input(s): CHOL, HDL, LDLCALC, TRIG, CHOLHDL, LDLDIRECT in the last 72 hours. Thyroid Function Tests Recent Labs    12/01/18 0507  TSH 2.910    Other results:   Imaging    No results found.   Medications:     Scheduled Medications: . allopurinol  100 mg Oral Daily  . amiodarone  400 mg Oral Q8H  . digoxin  0.0625 mg Oral Daily  . fluticasone  1 spray Each Nare Daily  . furosemide  80 mg Intravenous BID  . hydrALAZINE  25 mg Oral BID  . isosorbide mononitrate  30 mg Oral Daily  . montelukast  10 mg Oral QHS  . multivitamin with minerals  1 tablet Oral Daily  . pantoprazole  40 mg Oral Daily  . sodium chloride flush  10-40 mL Intracatheter Q12H  . sodium chloride flush  3 mL Intravenous Q12H  . spironolactone  25 mg Oral Daily  . vitamin C  500 mg Oral Daily    Infusions: . sodium chloride 250 mL (12/01/18 1216)  . milrinone 0.125 mcg/kg/min (12/01/18 1216)    PRN Medications: sodium chloride, acetaminophen, diphenhydrAMINE, guaiFENesin-dextromethorphan, loperamide, nitroGLYCERIN, ondansetron (ZOFRAN) IV, polyethylene glycol, simethicone, sodium chloride, sodium chloride flush, sodium chloride flush, traMADol    Patient Profile   [name] is a 73 y.o. male  with h/o CAD (s/p DES to LCx '03, residual total occlusion of RCA w/ collaterals, normal myoview 2012), ICM s/p Medtronic BiV ICD (upgrade in 2017), h/o Afib/flutter, HTN, HLD, CKD, and seizure disorder.   Admitted from [facility] clinic 11/22/2018 with concerns for low output CHF.   Assessment/Plan   1. Acute on chronic systolic CHF s/p BiV Medtronic ICD with low output - Echo as above with EF down to 15%, previously 35-40%. NYHA III.  - Co-ox and  clinical status much improved with milrinone (Initial coox 50%). Milrinone stopped 1/10 - Volume remains elevated. CVP 8-9. Creatinine trending up 2.06. - Coox 45% yesterday, so milrinone 0.125 restarted. Coox this am 85%. Recheck pending. - He will need home inotropes. He cannot afford. HF CSW is looking into helping him with this. Will need tunneled cath if we plan to send home on milrinone. Eliquis is on hold.  - Hold lasix today with worsening creatinine.  - Continue hydralazine 25 mg TID and Imdur 30 daily.  - Lisinopril stopped in 2018 due to AKI. Has ARBs listed as allergy. So no ACE/ARB/ARNI  -  Continue spiro to 25 mg daily. (Had Ks in the 5.2 - 5.4 range recently as outpatient, so will need to follow closely). K 4.3 today. - Hold digoxin with worsening creatinine.  - Not a candidate for transplant and would not want LVAD. Says he would consider home milrinone if he needs it to maintain QOL  - Dr Caryl Comes saw regarding PVCs and AF, thought to be contributing to worsening HF. See discussion below.  - No BB with low output.   2. CAD s/p PCI - He has occasional non-specific left chest pain, and a pain in his mid back that may be due to volume overload vs anginal equivalent.  - CFX PCI in 2003, CTO of RCA with collaterals. - Myoview April 2012-no ischemia,inferolateral scar - LHC 1/13 showed mild non obstructive CAD. No s/s ischemia.   3. Permanent A-fib  - Chronic by ICD interrogation 11/23/2018 - BiV paced ~ 90% of the time by interrogation on admit.  - Now on Eliquis. $9/month per CM note.  - EP consulted. Now on amio 400 mg TID to help control AF rate and therefore decrease RV pacing. He did not think PVC burden was very high. Recommended restarting BB when able. Pt is not interested in AVN.  - On tele he is going in and out of BiV pacing and trigger pacing due to elevated AF rate. No BB with low output.   4. AKI on CKD III  - Baseline Unclear. Has ranged from 1.4 - 1.9 in the past  several years.  - Likely due to cardiorenal syndrome  - Cr 1.86 -> 1.70 -> 1.55 -> 1.44 -> 1.43-> 1.53 -> 1.52 -> 1.59 -> 1.93 -> 2.06.   5. Seizure Disorder - Reported in chart. This is distant. He has not had a seizure since his 20-30s. He is not on medication. Reports having several "grand-mal" seizures with no clear etiology in his youth. No change.   6. Abdominal pain - Work up per pt report negative. CT of the abdomen and pelvis, abdominal ultrasound and CCK HIDA scan all done 10/2018 with no acute findings in the abdomen, no evidence of cholecystitis and noted stable pancreatic body cyst. - Sees [facility]. On Levsin for cramping/diarrhea.  - Occurred again yesterday with low output. Improved with diuresis and inotrope support. Suspect mostly related to low output CHF.   6. Hypokalemia - K 4.3  7. Goals of Care - Remains partial code. No CPR. - Palliative Care team following for [place] - He does NOT want LVAD consideration. Would consider home milrinone if needed.  - ICD tachy therapies now off. No change.    Wants TOC for meds when ready for DC.  Medication concerns reviewed with patient and pharmacy team. Barriers identified: Unable to afford home milrinone.   Length of Stay: [address], NP  12/02/2018, 8:29 AM  Advanced Heart Failure Team Pager [phone] (M-F; 7a - 4p)  Please contact [facility] for night-coverage after hours (4p -7a ) and weekends on [email or web address]   Patient seen and examined with the above-signed Advanced Practice Provider and/or Housestaff. I personally reviewed laboratory data, imaging studies and relevant notes. I independently examined the patient and formulated the important aspects of the plan. I have edited the note to reflect any of my changes or salient points. I have personally discussed the plan with the patient and/or family.  He is feeling better with re-initiation of inotropic support. Co-ox improved. CVP 8-9. However creatinine  slightly worse. He is willing to try home milrinone but can't afford the cost ($12/day). We have discussed with [facility] and our social work team extensively. Plan will be that the HF Program will pay for the first month and see how it goes. If tolerating well and wants to continue will need to work out payment for ongoing therapy. Will ask IR to place tunneled cath.   Glori Bickers, MD  6:35 PM

## 2018-12-03 ENCOUNTER — Inpatient Hospital Stay (HOSPITAL_COMMUNITY): Payer: PPO

## 2018-12-03 ENCOUNTER — Encounter (HOSPITAL_COMMUNITY): Payer: Self-pay | Admitting: Diagnostic Radiology

## 2018-12-03 DIAGNOSIS — I509 Heart failure, unspecified: Secondary | ICD-10-CM | POA: Diagnosis not present

## 2018-12-03 DIAGNOSIS — N179 Acute kidney failure, unspecified: Secondary | ICD-10-CM | POA: Diagnosis not present

## 2018-12-03 DIAGNOSIS — Q447 Other congenital malformations of liver: Secondary | ICD-10-CM | POA: Diagnosis not present

## 2018-12-03 DIAGNOSIS — I5022 Chronic systolic (congestive) heart failure: Secondary | ICD-10-CM | POA: Diagnosis not present

## 2018-12-03 HISTORY — PX: IR US GUIDE VASC ACCESS RIGHT: IMG2390

## 2018-12-03 HISTORY — PX: IR FLUORO GUIDE CV LINE RIGHT: IMG2283

## 2018-12-03 LAB — CBC
HCT: 32.2 % — ABNORMAL LOW (ref 39.0–52.0)
Hemoglobin: 10.8 g/dL — ABNORMAL LOW (ref 13.0–17.0)
MCH: 33 pg (ref 26.0–34.0)
MCHC: 33.5 g/dL (ref 30.0–36.0)
MCV: 98.5 fL (ref 80.0–100.0)
Platelets: 260 10*3/uL (ref 150–400)
RBC: 3.27 MIL/uL — ABNORMAL LOW (ref 4.22–5.81)
RDW: 14.8 % (ref 11.5–15.5)
WBC: 5.7 10*3/uL (ref 4.0–10.5)
nRBC: 0 % (ref 0.0–0.2)

## 2018-12-03 LAB — BASIC METABOLIC PANEL
Anion gap: 8 (ref 5–15)
BUN: 28 mg/dL — AB (ref 8–23)
CO2: 28 mmol/L (ref 22–32)
Calcium: 8.8 mg/dL — ABNORMAL LOW (ref 8.9–10.3)
Chloride: 95 mmol/L — ABNORMAL LOW (ref 98–111)
Creatinine, Ser: 1.95 mg/dL — ABNORMAL HIGH (ref 0.61–1.24)
GFR calc non Af Amer: 33 mL/min — ABNORMAL LOW (ref >60)
GFR, EST AFRICAN AMERICAN: 38 mL/min — AB (ref >60)
GLUCOSE: 111 mg/dL — AB (ref 70–99)
Potassium: 4.1 mmol/L (ref 3.5–5.1)
SODIUM: 131 mmol/L — AB (ref 135–145)

## 2018-12-03 LAB — COOXEMETRY PANEL
Carboxyhemoglobin: 1.5 % (ref 0.5–1.5)
Carboxyhemoglobin: 1.4 % (ref 0.5–1.5)
METHEMOGLOBIN: 1.4 % (ref 0.0–1.5)
Methemoglobin: 1.5 % (ref 0.0–1.5)
O2 Saturation: 80.3 %
O2 Saturation: 65.7 %
Total hemoglobin: 10.9 g/dL — ABNORMAL LOW (ref 12.0–16.0)
Total hemoglobin: 12.2 g/dL (ref 12.0–16.0)

## 2018-12-03 LAB — MAGNESIUM: Magnesium: 2 mg/dL (ref 1.7–2.4)

## 2018-12-03 MED ORDER — APIXABAN 5 MG PO TABS: 5.0000 mg | ORAL_TABLET | Freq: Two times a day (BID) | ORAL | 5 refills | Status: AC

## 2018-12-03 MED ORDER — TORSEMIDE 20 MG PO TABS: 40.0000 mg | ORAL_TABLET | Freq: Every day | ORAL | Status: DC

## 2018-12-03 MED ORDER — LIDOCAINE HCL 1 % IJ SOLN
INTRAMUSCULAR | Status: DC | PRN
  Administered 2018-12-03: 15 mL via INTRADERMAL

## 2018-12-03 MED ORDER — SPIRONOLACTONE 25 MG PO TABS: 25.0000 mg | ORAL_TABLET | Freq: Every day | ORAL | 6 refills | Status: AC

## 2018-12-03 MED ORDER — LIDOCAINE HCL 1 % IJ SOLN
INTRAMUSCULAR | Status: AC
  Filled 2018-12-03: qty 20

## 2018-12-03 MED ORDER — ISOSORBIDE MONONITRATE ER 30 MG PO TB24: 30.0000 mg | ORAL_TABLET | Freq: Every day | ORAL | 5 refills | Status: AC

## 2018-12-03 MED ORDER — HEPARIN SOD (PORK) LOCK FLUSH 100 UNIT/ML IV SOLN
INTRAVENOUS | Status: AC
  Filled 2018-12-03: qty 5

## 2018-12-03 MED ORDER — AMIODARONE HCL 400 MG PO TABS: 400.0000 mg | ORAL_TABLET | Freq: Three times a day (TID) | ORAL | 5 refills | Status: DC

## 2018-12-03 MED ORDER — HYDRALAZINE HCL 25 MG PO TABS: 25.0000 mg | ORAL_TABLET | Freq: Three times a day (TID) | ORAL | 5 refills | Status: AC

## 2018-12-03 MED ORDER — MILRINONE LACTATE IN DEXTROSE 20-5 MG/100ML-% IV SOLN: 0.2500 ug/kg/min | INTRAVENOUS | 11 refills | Status: AC

## 2018-12-03 MED ORDER — HYDRALAZINE HCL 25 MG PO TABS
25.0000 mg | ORAL_TABLET | Freq: Three times a day (TID) | ORAL | Status: DC
  Administered 2018-12-03: 25 mg via ORAL
  Filled 2018-12-03: qty 1

## 2018-12-03 MED ORDER — TORSEMIDE 20 MG PO TABS: 40.0000 mg | ORAL_TABLET | Freq: Every day | ORAL | 5 refills | Status: AC

## 2018-12-03 MED ORDER — HEPARIN SOD (PORK) LOCK FLUSH 100 UNIT/ML IV SOLN
INTRAVENOUS | Status: DC | PRN
  Administered 2018-12-03: 500 [IU]

## 2018-12-03 MED ORDER — FUROSEMIDE 10 MG/ML IJ SOLN
80.0000 mg | Freq: Once | INTRAMUSCULAR | Status: AC
  Administered 2018-12-03: 80 mg via INTRAVENOUS
  Filled 2018-12-03: qty 8

## 2018-12-03 MED ORDER — TORSEMIDE 20 MG PO TABS
40.0000 mg | ORAL_TABLET | Freq: Every day | ORAL | Status: DC
  Administered 2018-12-03: 40 mg via ORAL
  Filled 2018-12-03: qty 2

## 2018-12-03 MED FILL — SPIRONOLACTONE 25 MG TABLET: 25 | 30 days supply | Qty: 30 | Fill #1 | Status: TO

## 2018-12-03 MED FILL — ISOSORBIDE MN ER 30 MG TAB: 30 | 30 days supply | Qty: 30 | Fill #0

## 2018-12-03 MED FILL — AMIODARONE HCL 200 MG TABS: 200 | 30 days supply | Qty: 180 | Fill #0

## 2018-12-03 MED FILL — TORSEMIDE 20 MG TABLET: 20 | 30 days supply | Qty: 60 | Fill #0

## 2018-12-03 MED FILL — ELIQUIS 5 MG TABLET: 5 | 30 days supply | Qty: 60 | Fill #0

## 2018-12-03 MED FILL — AMIODARONE HCL 200 MG TABS: 200 | 30 days supply | Qty: 180 | Fill #1 | Status: TO

## 2018-12-03 MED FILL — SPIRONOLACTONE 25 MG TABLET: 25 | 30 days supply | Qty: 30 | Fill #0

## 2018-12-03 NOTE — Procedures (Signed)
Interventional Radiology Procedure:   Indications: Needs central line for home milrinone  Procedure: Tunneled central line placement  Findings: Right jugular double lumen Powerline placed, tip at SVC/RA junction   Complications: None     EBL: Minimal   Plan: Central line is ready to use.     [name] R. Anselm Pancoast, MD  Pager: [phone]

## 2018-12-03 NOTE — Progress Notes (Signed)
Durable Medical Equipment  (From admission, onward)         Start     Ordered   12/03/18 0950  For home use only DME Shower stool  Once     12/03/18 0950   12/03/18 0950  For home use only DME Hospital bed  Once    Question Answer Comment  The above medical condition requires: Patient requires the ability to reposition frequently   Head must be elevated greater than: 30 degrees   Bed type Semi-electric      12/03/18 0950   12/03/18 0944  Heart failure home health orders  (Heart failure home health orders / Face to face)  Once    Comments:  Heart Failure Follow-up Care:  Verify follow-up appointments per Patient Discharge Instructions. Confirm transportation arranged. Reconcile home medications with discharge medication list. Remove discontinued medications from use. Assist patient/caregiver to manage medications using pill box. Reinforce low sodium food selection Assessments: Vital signs and oxygen saturation at each visit. Assess home environment for safety concerns, caregiver support and availability of low-sodium foods. Consult Education officer, museum, PT/OT, Dietitian, and CNA based on assessments. Perform comprehensive cardiopulmonary assessment. Notify MD for any change in condition or weight gain of 3 pounds in one day or 5 pounds in one week with symptoms. Daily Weights and Symptom Monitoring:  [facility] to provide  Labs every other week to include BMET, Mg, and CBC with Diff. Additional as needed. Should be drawn via PERIPHERAL stick. NOT PICC line.   N2355 Milrinone 0.25 mcg/kg/min X 52 weeks A4221 Supplies for maintenance of drug infusion catheter A4222 Supplies for the external drug infusion per cassette or bag [phone] Ambulatory Infusion pump  Ensure patient has access to scales. Teach patient/caregiver to weigh daily before breakfast and after voiding using same scale and record.    Teach patient/caregiver to track weight and symptoms and when to notify  Provider. Activity: Develop individualized activity plan with patient/caregiver.  Question Answer Comment  Heart Failure Follow-up Care Advanced Heart Failure (AHF) Clinic at [phone]   Obtain the following labs Basic Metabolic Panel   Lab frequency Other see comments   Fax lab results to AHF Clinic at [phone]   Diet Low Sodium Heart Healthy   Fluid restrictions: 2000 mL Fluid   Consult: Case manager   Consult: Social work   Initiate Heart Failure Clinic Diuretic Protocol to be used by [place] only ( to be ordered by Heart Failure Team Providers Only) Yes      12/03/18 0944   11/30/18 1112  Heart failure home health orders  (Heart failure home health orders / Face to face)  Once    Comments:  Heart Failure Follow-up Care:  Verify follow-up appointments per Patient Discharge Instructions. Confirm transportation arranged. Reconcile home medications with discharge medication list. Remove discontinued medications from use. Assist patient/caregiver to manage medications using pill box. Reinforce low sodium food selection Assessments: Vital signs and oxygen saturation at each visit. Assess home environment for safety concerns, caregiver support and availability of low-sodium foods. Consult Education officer, museum, PT/OT, Dietitian, and CNA based on assessments. Perform comprehensive cardiopulmonary assessment. Notify MD for any change in condition or weight gain of 3 pounds in one day or 5 pounds in one week with symptoms. Daily Weights and Symptom Monitoring:  HHRN 2 wk 2 for CP assessment   Ensure patient has access to scales. Teach patient/caregiver to weigh daily before breakfast and after voiding using same scale and record.  Teach patient/caregiver to track weight and symptoms and when to notify Provider. Activity: Develop individualized activity plan with patient/caregiver.  Question Answer Comment  Heart Failure Follow-up Care Advanced Heart Failure (AHF)  Clinic at [phone]   Obtain the following labs Other see comments   Lab frequency Other see comments   Fax lab results to AHF Clinic at [phone]   Diet Low Sodium Heart Healthy   Fluid restrictions: 2000 mL Fluid   Initiate Heart Failure Clinic Diuretic Protocol to be used by [place] only ( to be ordered by Heart Failure Team Providers Only) Yes      11/30/18 1112

## 2018-12-03 NOTE — Progress Notes (Signed)
17 beats of NSVT on telemetry.  Patient asymptomatic.  Cardiology fellow, [place] notified.  Orders received.  Will check Mag with am labs.  Will continue to monitor. Jodell Cipro

## 2018-12-03 NOTE — Plan of Care (Signed)
  Problem: Education: Goal: Ability to demonstrate management of disease process will improve Outcome: Progressing Note:  Voices understanding that he will need a tunneled cath in order to continue the milrinone infusion at home.

## 2018-12-03 NOTE — Care Management Important Message (Signed)
Important Message  Patient Details  Name: [name] MRN: [id number] Date of Birth: [birth year]   Medicare Important Message Given:  Yes    Barb Merino Rosendo Couser 12/03/2018, 4:49 PM

## 2018-12-03 NOTE — Progress Notes (Signed)
   Patient Status: [facility] - In-pt  Assessment and Plan: Patient in need of venous access.   Tunneled central catheter placement  ______________________________________________________________________   History of Present Illness: [name] is a 73 y.o. male   Low output and EF decreased Heart failure Started on Milrinone Needs home milrinone  LD Eliquis 1/15 am  Allergies and medications reviewed.   Review of Systems: A 12 point ROS discussed and pertinent positives are indicated in the HPI above.  All other systems are negative.   Vital Signs: BP 118/70   Pulse 86   Temp 97.8 F (36.6 C) (Oral)   Resp 18   Ht 5' 8.25" (1.734 m)   Wt 155 lb 6.4 oz (70.5 kg)   SpO2 99%   BMI 23.46 kg/m   Physical Exam Vitals signs reviewed.  Constitutional:      Appearance: Normal appearance.  Cardiovascular:     Rate and Rhythm: Normal rate.  Pulmonary:     Effort: Pulmonary effort is normal.     Breath sounds: Normal breath sounds.  Musculoskeletal: Normal range of motion.  Skin:    General: Skin is warm and dry.  Neurological:     Mental Status: He is oriented to person, place, and time.  Psychiatric:        Mood and Affect: Mood normal.        Behavior: Behavior normal.        Thought Content: Thought content normal.        Judgment: Judgment normal.      Imaging reviewed.   Labs:  COAGS: Recent Labs    11/26/18 0438 11/27/18 0517 11/28/18 0203 11/29/18 0337  INR 2.06 1.53 1.59 1.38    BMP: Recent Labs    11/30/18 0525 12/01/18 0507 12/02/18 0628 12/03/18 0509  NA 131* 132* 131* 131*  K 4.1 4.2 4.3 4.1  CL 96* 95* 93* 95*  CO2 24 28 27 28   GLUCOSE 120* 121* 115* 111*  BUN 21 26* 32* 28*  CALCIUM 8.7* 9.0 9.0 8.8*  CREATININE 1.59* 1.93* 2.06* 1.95*  GFRNONAA 42* 33* 31* 33*  GFRAA 49* 39* 36* 38*   Scheduled for tunneled central catheter placement Pt is aware of procedure benefits and risks including but not limited to Infection;  bleeding; vessel damage Agreeable to proceed Consent signed in chart    Electronically Signed: Lavonia Drafts, [name] 12/03/2018, 9:51 AM   I spent a total of 15 minutes in face to face in clinical consultation, greater than 50% of which was counseling/coordinating care for venous access.

## 2018-12-03 NOTE — Progress Notes (Addendum)
Advanced Heart Failure Rounding Note  PCP-Cardiologist: Dr Haroldine Laws  Subjective:    Admitted from [facility] clinic 11/22/2018 with concerns for low output with EF down to 15% from 35-40. CHF team consulted. Initial mixed venous saturation 50%. Started on milrinone 0.25 mcg/kg/min on 1/8. Milrinone stopped 1/10. Had [facility] 1/14 (see below).   Milrinone restarted 1/15 with coox 45%. Increased to 0.25 yesterday with coox 56% (tolerated 0.25 well earlier in admission ). Coox today 80%. Recheck ordered.    Remains on amio 400 mg TID. Eliquis on hold for tunneled line for home inotropes.  Lasix held yesterday. Creatinine trending down 2.06 -> 1.95. CVP 10-11  Feels okay. Wants to go home. No abdominal pain. Denies SOB walking around, but +orthopnea.  R/LHC 11/29/2018:  Mid RCA to Dist RCA lesion is 70% stenosed.  Prox RCA to Mid RCA lesion is 60% stenosed.  Ost LM to Mid LM lesion is 20% stenosed.  Ost 1st Mrg to 1st Mrg lesion is 60% stenosed.  Prox LAD lesion is 30% stenosed. Findings: Ao = 105/65 (82) LV =  106/24 RA =  5 RV = 52/6 PA = 56/25 (37) PCW = 28 (v waves to 35) Fick cardiac output/index = 4.3/2.3 PVR = 2.0 WU FA sat = 99% PA sat = 58%, 62% Assessment: 1. Mild non-obstructive CAD 2. Severe NICM with EF 10% 3. Moderately elevated L-sided filling pressures with moderately decreased CO  Echo 11/19/2018 LVEF 15%, Mild AI, Mild MR, Severe LAE, RV mildly dilated, moderately reduced, Mod RAE,  (This is significantly decreased from Echo 12/12/2015 with EF 35-40% with mild RV dilation, normal function at that time)  Objective:   Weight Range: 70.5 kg Body mass index is 23.46 kg/m.   Vital Signs:   Temp:  [97.3 F (36.3 C)-97.9 F (36.6 C)] 97.8 F (36.6 C) (01/17 0750) Pulse Rate:  [85-100] 86 (01/17 0750) Resp:  [16-18] 18 (01/17 0750) BP: (108-119)/(70-78) 118/70 (01/17 0829) SpO2:  [97 %-99 %] 99 % (01/17 0750) Weight:  [70.5 kg] 70.5 kg (01/17 0535) Last BM  Date: 11/28/18  Weight change: Filed Weights   12/02/18 0510 12/03/18 0500 12/03/18 0535  Weight: 70.3 kg 70.5 kg 70.5 kg   Intake/Output:   Intake/Output Summary (Last 24 hours) at 12/03/2018 0854 Last data filed at 12/03/2018 0538 Gross per 24 hour  Intake 975.95 ml  Output 1300 ml  Net -324.05 ml    Physical Exam   General: Well appearing. No resp difficulty. HOB elevated. HEENT: Normal Neck: Supple. JVP elevated. Carotids 2+ bilat; no bruits. No thyromegaly or nodule noted. Cor: PMI laterally displaced. RRR, No M/G/R noted Lungs: CTAB, normal effort. Abdomen: Soft, non-tender, non-distended, no HSM. No bruits or masses. +BS  Extremities: No cyanosis, clubbing, or rash. R and LLE no edema. RUE PICC. Warm.  Neuro: Alert & orientedx3, cranial nerves grossly intact. moves all 4 extremities w/o difficulty. Affect pleasant  Telemetry   HR 80-100s with Vpacing. Continues to go in and out of BiV pacing and trigger pacing due to elevated AF rate. Personally reviewed.   EKG    No new tracings.   Labs    CBC Recent Labs    12/02/18 0628 12/03/18 0509  WBC 6.1 5.7  HGB 11.4* 10.8*  HCT 34.3* 32.2*  MCV 98.0 98.5  PLT 247 295   Basic Metabolic Panel Recent Labs    12/02/18 0628 12/03/18 0509  NA 131* 131*  K 4.3 4.1  CL 93* 95*  CO2 27 28  GLUCOSE 115* 111*  BUN 32* 28*  CREATININE 2.06* 1.95*  CALCIUM 9.0 8.8*  MG  --  2.0   Liver Function Tests Recent Labs    12/01/18 0507  AST 31  ALT 27  ALKPHOS 92  BILITOT 2.4*  PROT 7.5  ALBUMIN 3.3*   No results for input(s): LIPASE, AMYLASE in the last 72 hours. Cardiac Enzymes No results for input(s): CKTOTAL, CKMB, CKMBINDEX, TROPONINI in the last 72 hours.  BNP: BNP (last 3 results) No results for input(s): BNP in the last 8760 hours.  ProBNP (last 3 results) Recent Labs    11/15/18 1001  PROBNP 1,353.0*     D-Dimer No results for input(s): DDIMER in the last 72 hours. Hemoglobin A1C No  results for input(s): HGBA1C in the last 72 hours. Fasting Lipid Panel No results for input(s): CHOL, HDL, LDLCALC, TRIG, CHOLHDL, LDLDIRECT in the last 72 hours. Thyroid Function Tests Recent Labs    12/01/18 0507  TSH 2.910    Other results:   Imaging    No results found.   Medications:     Scheduled Medications: . allopurinol  100 mg Oral Daily  . amiodarone  400 mg Oral Q8H  . fluticasone  1 spray Each Nare Daily  . hydrALAZINE  25 mg Oral BID  . isosorbide mononitrate  30 mg Oral Daily  . montelukast  10 mg Oral QHS  . multivitamin with minerals  1 tablet Oral Daily  . pantoprazole  40 mg Oral Daily  . sodium chloride flush  10-40 mL Intracatheter Q12H  . sodium chloride flush  3 mL Intravenous Q12H  . spironolactone  25 mg Oral Daily  . vitamin C  500 mg Oral Daily    Infusions: . sodium chloride 250 mL (12/03/18 0826)  . milrinone 0.25 mcg/kg/min (12/03/18 0829)    PRN Medications: sodium chloride, acetaminophen, diphenhydrAMINE, guaiFENesin-dextromethorphan, loperamide, nitroGLYCERIN, ondansetron (ZOFRAN) IV, polyethylene glycol, simethicone, sodium chloride, sodium chloride flush, sodium chloride flush, traMADol    Patient Profile   [name] is a 73 y.o. male  with h/o CAD (s/p DES to LCx '03, residual total occlusion of RCA w/ collaterals, normal myoview 2012), ICM s/p Medtronic BiV ICD (upgrade in 2017), h/o Afib/flutter, HTN, HLD, CKD, and seizure disorder.   Ad[facility] from [facility] clinic 11/22/[facility] with concerns for low output CHF.   Assessment/Plan   1. Acute on chronic systolic CHF s/p BiV Medtronic ICD with low output - Echo as above with EF down to 15%, previously 35-40%. NYHA III.  - Co-ox and clinical status much improved with milrinone (Initial coox 50%). Milrinone stopped 1/10 - Coox 45% 1/15, so milrinone 0.125 restarted. Coox yesterday 56%. Milrinone increased to 0.25. Coox this am 80%. Recheck ordered.  - Planning for tunneled line  today for home milrinone. Will pay through HF fund for first month. Hopefully [facility] can help waive his copays. Still pending. Spoke with Carolynn Sayers today. IR aware he is pending DC. - IV Lasix has been on hold. Volume back up. He is orthopneic this morning. NYHA class IV today. CVP 10-11. Give 80 mg IV lasix x1, then start torsemide 40 mg daily this afternoon.  - Continue hydralazine 25 mg TID and Imdur 30 daily.  - Lisinopril stopped in 2018 due to AKI. Has ARBs listed as allergy. So no ACE/ARB/ARNI  - Continue spiro 25 mg daily. (Had Ks in the 5.2 - 5.4 range recently as outpatient, so will need to  follow closely). K 4.1 today. - Hold digoxin with worsening creatinine.  - Not a candidate for transplant and would not want LVAD. Says he would consider home milrinone if he needs it to maintain QOL  - Dr Caryl Comes saw regarding PVCs and AF, thought to be contributing to worsening HF. See discussion below.  - No BB with low output.   2. CAD s/p PCI - He has occasional non-specific left chest pain, and a pain in his mid back that may be due to volume overload vs anginal equivalent.  - CFX PCI in 2003, CTO of RCA with collaterals. - Myoview April 2012-no ischemia,inferolateral scar - LHC 1/13 showed mild non obstructive CAD. No s/s ischemia.   3. Permanent A-fib  - Chronic by ICD interrogation 11/23/2018 - BiV paced ~ 90% of the time by interrogation on admit.  - Now on Eliquis. $9/month per CM note.  - EP consulted. Now on amio 400 mg TID to help control AF rate and therefore decrease RV pacing. He did not think PVC burden was very high. Recommended restarting BB when able. Pt is not interested in AVN.  - On tele he is going in and out of BiV pacing and trigger pacing due to elevated AF rate. No BB with low output. No change.   4. AKI on CKD III  - Baseline Unclear. Has ranged from 1.4 - 1.9 in the past several years.  - Likely due to cardiorenal syndrome  - Cr 1.86 -> 1.70 -> 1.55 -> 1.44 ->  1.43-> 1.53 -> 1.52 -> 1.59 -> 1.93 -> 2.06 -> 1.95.   5. Seizure Disorder - Reported in chart. This is distant. He has not had a seizure since his 20-30s. He is not on medication. Reports having several "grand-mal" seizures with no clear etiology in his youth. No change.   6. Abdominal pain - Work up per pt report negative. CT of the abdomen and pelvis, abdominal ultrasound and CCK HIDA scan all done 10/2018 with no acute findings in the abdomen, no evidence of cholecystitis and noted stable pancreatic body cyst. - Sees [facility]. On Levsin for cramping/diarrhea.  - Occurred again with low output. Improved with diuresis and inotrope support. Suspect mostly related to low output CHF.   6. Hypokalemia - K 4.1  7. Goals of Care - Remains partial code. No CPR. - Palliative Care team following for [place] - He does NOT want LVAD consideration. Would consider home milrinone if needed.  - ICD tachy therapies now off. No change.   Wants TOC for meds when ready for DC. Will verify medications for home with Dr Haroldine Laws and get them to him today, prior to weekend.  Spoke with IR. They are aware he is pending discharge and will try to get to him this afternoon. If they are unable to get to today, they will have on call team come in tomorrow. I will check back with them this afternoon so I can let [facility] know.   Medication concerns reviewed with patient and pharmacy team. Barriers identified: Unable to afford home milrinone.   Length of Stay: [place], NP  12/03/2018, 8:54 AM  Advanced Heart Failure Team Pager [phone] (M-F; 7a - 4p)  Please contact [facility] for night-coverage after hours (4p -7a ) and weekends on [email or web address]  Patient seen and examined with the above-signed Advanced Practice Provider and/or Housestaff. I personally reviewed laboratory data, imaging studies and relevant notes. I independently examined the patient and  formulated the important aspects of the plan. I  have edited the note to reflect any of my changes or salient points. I have personally discussed the plan with the patient and/or family.  Volume status still elevated. Now with orthopnea and class IV HF symptoms.Marland Kitchen He is back on milrinone at 0.25. Awaiting tunneled PICC. Will give IV lasix this am. We have discussed with IR and [facility]. Hopefully can go home later today with home milrinone once PICC placed. Resume Eliquis after PICC placed. Creatinine improving again with inotrope soon.   Glori Bickers, MD  11:53 AM

## 2018-12-03 NOTE — Progress Notes (Signed)
Home Paraenteral Inotropic Therapy : Data Collection Form  Patients name: [name]   Date: 12/03/18  Information below may not be completed by the supplier nor anyone in a Financial relationship with the supplier.  1. Results of invasive hemodynamic monitoring  Cardiac Index Before Inotrope infusion:            1.49               On Inotrope infusion:            1.94               Drug and dose:   Milrinone @ 0.25 mcg/kg/min  2. Cardiac medications immediately prior to inotrope infusion (List name, dose, and frequency) Amiodarone 400 mg TID Eliquis 5 mg BID Digoxin 0.026 mg daily Lasix 80 mg IV BID Hydralazine 25 mg BID Imdur 30 mg daily Spironolactone 25 mg daily  3. Dose this represent maximum tolerated doses of these medications? Yes.   4. Breathing status Prior to inotrope infusion: Dyspnea at rest  At time of discharge: Dyspnea on moderate exertion.   5. Initial home prescription Drug and Dose:   Milrinone 0.25 mcg/kg/min for continuous infusion 24/hr day and 7 days/week  6. If continuous infusion is prescribed, have attempts to discontinue inotrope infusion in the hospital failed?   Yes.   7. If intermittent infusion is prescribed, have there been repeated hospitalizations for heart failure which Parenteral inotrope were required? Not applicable.   8. Is patient capable of going to the physician for outpatient evaluation? Yes.   9. Is routine electrocardiographic monitoring required in the Home?  No.   The above statements and any additional explanations included separately are true and accurate and there is documentation present in the patients medical record to support these statements.   Completed by [name], NP   In instances where this form was completed by an Advanced Practice Provider, please see EMR for physician Co-Signature.

## 2018-12-04 DIAGNOSIS — Z79899 Other long term (current) drug therapy: Secondary | ICD-10-CM | POA: Diagnosis not present

## 2018-12-04 DIAGNOSIS — I4821 Permanent atrial fibrillation: Secondary | ICD-10-CM | POA: Diagnosis not present

## 2018-12-04 DIAGNOSIS — I4892 Unspecified atrial flutter: Secondary | ICD-10-CM | POA: Diagnosis not present

## 2018-12-04 DIAGNOSIS — I5022 Chronic systolic (congestive) heart failure: Secondary | ICD-10-CM | POA: Diagnosis not present

## 2018-12-04 DIAGNOSIS — I2510 Atherosclerotic heart disease of native coronary artery without angina pectoris: Secondary | ICD-10-CM | POA: Diagnosis not present

## 2018-12-04 DIAGNOSIS — Z9181 History of falling: Secondary | ICD-10-CM | POA: Diagnosis not present

## 2018-12-04 DIAGNOSIS — I255 Ischemic cardiomyopathy: Secondary | ICD-10-CM | POA: Diagnosis not present

## 2018-12-04 DIAGNOSIS — Z452 Encounter for adjustment and management of vascular access device: Secondary | ICD-10-CM | POA: Diagnosis not present

## 2018-12-04 DIAGNOSIS — K760 Fatty (change of) liver, not elsewhere classified: Secondary | ICD-10-CM | POA: Diagnosis not present

## 2018-12-04 DIAGNOSIS — Q447 Other congenital malformations of liver: Secondary | ICD-10-CM | POA: Diagnosis not present

## 2018-12-04 DIAGNOSIS — N183 Chronic kidney disease, stage 3 (moderate): Secondary | ICD-10-CM | POA: Diagnosis not present

## 2018-12-04 DIAGNOSIS — I5023 Acute on chronic systolic (congestive) heart failure: Secondary | ICD-10-CM | POA: Diagnosis not present

## 2018-12-04 DIAGNOSIS — I447 Left bundle-branch block, unspecified: Secondary | ICD-10-CM | POA: Diagnosis not present

## 2018-12-04 DIAGNOSIS — J449 Chronic obstructive pulmonary disease, unspecified: Secondary | ICD-10-CM | POA: Diagnosis not present

## 2018-12-04 DIAGNOSIS — I130 Hypertensive heart and chronic kidney disease with heart failure and stage 1 through stage 4 chronic kidney disease, or unspecified chronic kidney disease: Secondary | ICD-10-CM | POA: Diagnosis not present

## 2018-12-04 DIAGNOSIS — Z95810 Presence of automatic (implantable) cardiac defibrillator: Secondary | ICD-10-CM | POA: Diagnosis not present

## 2018-12-04 DIAGNOSIS — I509 Heart failure, unspecified: Secondary | ICD-10-CM | POA: Diagnosis not present

## 2018-12-04 DIAGNOSIS — N179 Acute kidney failure, unspecified: Secondary | ICD-10-CM | POA: Diagnosis not present

## 2018-12-04 DIAGNOSIS — G40909 Epilepsy, unspecified, not intractable, without status epilepticus: Secondary | ICD-10-CM | POA: Diagnosis not present

## 2018-12-04 DIAGNOSIS — Z7901 Long term (current) use of anticoagulants: Secondary | ICD-10-CM | POA: Diagnosis not present

## 2018-12-06 ENCOUNTER — Other Ambulatory Visit: Payer: Self-pay | Admitting: *Deleted

## 2018-12-06 ENCOUNTER — Other Ambulatory Visit: Payer: Self-pay

## 2018-12-06 DIAGNOSIS — Z5181 Encounter for therapeutic drug level monitoring: Secondary | ICD-10-CM | POA: Diagnosis not present

## 2018-12-06 DIAGNOSIS — I509 Heart failure, unspecified: Secondary | ICD-10-CM | POA: Diagnosis not present

## 2018-12-06 DIAGNOSIS — Q447 Other congenital malformations of liver: Secondary | ICD-10-CM | POA: Diagnosis not present

## 2018-12-06 DIAGNOSIS — I5022 Chronic systolic (congestive) heart failure: Secondary | ICD-10-CM | POA: Diagnosis not present

## 2018-12-06 DIAGNOSIS — N179 Acute kidney failure, unspecified: Secondary | ICD-10-CM | POA: Diagnosis not present

## 2018-12-06 DIAGNOSIS — I5023 Acute on chronic systolic (congestive) heart failure: Secondary | ICD-10-CM

## 2018-12-06 NOTE — Consult Note (Signed)
[facility] Care Management hospital liaison follow up.  Chart reviewed. Noted Mr. Redner was discharged home with home health. Spoke with Cheri with [facility] to make aware of hospital discharge. Will still make referral to [facility] for follow since uncertain whether Mr. Lotito will fully enroll with [facility] or not.  Marthenia Rolling, MSN-Ed, RN,BSN [facility] Liaison [phone]

## 2018-12-07 ENCOUNTER — Other Ambulatory Visit: Payer: Self-pay | Admitting: Physician Assistant

## 2018-12-08 ENCOUNTER — Telehealth: Payer: Self-pay | Admitting: *Deleted

## 2018-12-08 ENCOUNTER — Other Ambulatory Visit: Payer: Self-pay

## 2018-12-08 ENCOUNTER — Other Ambulatory Visit: Payer: Self-pay | Admitting: Physician Assistant

## 2018-12-08 ENCOUNTER — Telehealth: Payer: Self-pay | Admitting: Pharmacist

## 2018-12-08 NOTE — Addendum Note (Signed)
Addended by: Thana Ates on: 12/08/2018 07:30 AM   Modules accepted: Orders

## 2018-12-08 NOTE — Patient Outreach (Signed)
[facility]) Care Management   12/08/2018  [name] [birth year] [id number]  [name] is an 73 y.o. male  Subjective: Arrived for home visit. Wife and step son is present: Patient reports severe abdominal pain. No bowel movement.  Reports nausea. Burping and belching. Patient reports no bowel movement for 3 days. States he was in the hospital for abdominal pain related to his heart failure.  Patient reports walking all the time in the home. Drinking apple juice, eating prunes. Took a dose of Miralax without results.  Patient reports to me that if "this drip" does not work he is ready for hospice. Reports that he has no desire to go back and forth to the hospital.  Wife states Pink original MOST form was taken by the hospital and she only has a copy.  She needs the original to be valid for at home.  Objective:  Pacing the home. Awake and alert. Trace of edema  Today's Vitals   12/08/18 0920 12/08/18 0923  BP: 130/60   Pulse: 84   Resp: 18   SpO2: 97%   Weight: 149 lb 9.6 oz (67.9 kg)   Height: 1.727 m (5\' 8" )   PainSc:  7    Review of Systems  Constitutional: Positive for malaise/fatigue and weight loss.  HENT:       Clear nasal drainage  Eyes:       Wears reading glasses  Respiratory: Positive for cough.   Cardiovascular: Negative.   Gastrointestinal: Positive for abdominal pain, constipation and nausea.  Genitourinary:       Reports dark yellow urine  Musculoskeletal: Negative.   Skin: Negative.   Neurological: Positive for tremors.  Endo/Heme/Allergies: Bruises/bleeds easily.  Psychiatric/Behavioral: Negative.     Physical Exam  Constitutional: He is oriented to person, place, and time. He appears well-developed and well-nourished.  Thin appearing  Cardiovascular: Normal rate and intact distal pulses.  Irregular heart beat  Respiratory: Effort normal and breath sounds normal.  GI: Soft. Bowel sounds are normal.  Musculoskeletal: Normal  range of motion.        General: Edema present.     Comments: Trace of edema  Neurological: He is alert and oriented to person, place, and time.  Skin: Skin is warm and dry.  Psychiatric: He has a normal mood and affect. His behavior is normal. Thought content normal.    Encounter Medications:   Outpatient Encounter Medications as of 12/08/2018  Medication Sig Note  . allopurinol (ZYLOPRIM) 100 MG tablet Take 100 mg by mouth daily.   Marland Kitchen amiodarone (PACERONE) 400 MG tablet Take 1 tablet (400 mg total) by mouth every 8 (eight) hours.   Marland Kitchen apixaban (ELIQUIS) 5 MG TABS tablet Take 1 tablet (5 mg total) by mouth 2 (two) times daily.   . Calcium-Magnesium 500-250 MG TABS Take 1 tablet by mouth daily.    . hydrALAZINE (APRESOLINE) 25 MG tablet Take 1 tablet (25 mg total) by mouth 3 (three) times daily.   . isosorbide mononitrate (IMDUR) 30 MG 24 hr tablet Take 1 tablet (30 mg total) by mouth daily.   . milrinone (PRIMACOR) 20 MG/100 ML SOLN infusion Inject 0.0176 mg/min into the vein continuous.   . montelukast (SINGULAIR) 10 MG tablet Take 10 mg by mouth daily.    . Multiple Vitamin (MULTIVITAMIN WITH MINERALS) TABS Take 1 tablet by mouth daily.   Marland Kitchen omeprazole (PRILOSEC) 40 MG capsule Take 1 capsule (40 mg total) by mouth every morning.   Marland Kitchen  spironolactone (ALDACTONE) 25 MG tablet Take 1 tablet (25 mg total) by mouth daily.   Marland Kitchen torsemide (DEMADEX) 20 MG tablet Take 2 tablets (40 mg total) by mouth daily.   . traMADol (ULTRAM) 50 MG tablet Take 1 tablet by mouth every 6 hours as needed for pain. (Patient taking differently: Take 50 mg by mouth every 6 (six) hours as needed for moderate pain. ) 12/08/2018: Old prescription from 2013.   . diphenhydrAMINE (BENADRYL) 25 mg capsule Take 25 mg by mouth every 6 (six) hours as needed for allergies.    . fish oil-omega-3 fatty acids 1000 MG capsule Take 2 g by mouth daily.   Marland Kitchen loperamide (IMODIUM A-D) 2 MG tablet Take 2 mg by mouth as needed for diarrhea or  loose stools.   . magnesium oxide (MAG-OX) 400 MG tablet Take 1 tablet (400 mg total) by mouth daily.   . nitroGLYCERIN (NITROSTAT) 0.4 MG SL tablet Place 0.4 mg under the tongue every 5 (five) minutes as needed for chest pain. (up to 3 doses) For chest pain    No facility-administered encounter medications on file as of 12/08/2018.     Functional Status:   In your present state of health, do you have any difficulty performing the following activities: 12/08/2018 11/22/2018  Hearing? N -  Vision? Y -  Difficulty concentrating or making decisions? Y -  Walking or climbing stairs? N -  Dressing or bathing? Y -  Comment due to picc line -  Doing errands, shopping? Y N  Comment not driving Facilities manager and eating ? N -  Using the Toilet? N -  In the past six months, have you accidently leaked urine? N -  Do you have problems with loss of bowel control? N -  Managing your Medications? Y -  Comment due to cost of milirone.  Heart failure clinic sponsored patient at this time.  -  Managing your Finances? N -  Housekeeping or managing your Housekeeping? N -  Some recent data might be hidden    Fall/Depression Screening:    Fall Risk  12/08/2018  Falls in the past year? 0   PHQ 2/9 Scores 12/08/2018  PHQ - 2 Score 1    Assessment:   (1) Reviewed [facility] program. Reviewed consent that was received while in the hospital. Provided my contact information, magnet and [facility] calendar.  (2) Heart failure: weighs daily, follows low salt diet. Takes medications as prescribed. Reports he is not sure of amount for fluid restricition. (3) reports severe abdominal pain. Patient believes from heart failure, constipation for too many medications at once.Bowel sounds present and patient is passing gas.  (4) MOST form: filled out in the hospital but original was kept by hospital.  (5) Patient interested in hospice if milirone does not work. (6) financial concerns about hospitalizations and medical bills.   (7) no follow up with primary MD.  Patient does not feel this would be helpful as he wants to be managed by heart failure clinic.    Plan:  (1) consent in chart. Next outreach planned for 1 week. (2) Provided low salt poster and reviewed sodium content in soft drinks. Reviewed chart and noted fluid restriction of 2 liters. Share this information with patient and wife.  (3) Reviewed symptoms with patient. In basket message sent to heart failure clinic. Encouraged patient to use over the counter medications. Encourage fiber.  Reviewed with patient the need to inform primary care MD. Post visit phone  back to patient to reports message from Dr. Haroldine Laws.  Patient prefers to wait to see if his bowel move within the next 24 hours. Will follow up with patient on 12/10/2018. (4) Provided wife with a blank MOST form and encouraged wife to take with her to MD appointment on 12/10/2018. (5) Reviewed with patient that we are happy to help with the transition to hospice care whenever he is ready. (6) Reviewed patient assistance available to [facility]. Encouraged wife to call the accounting office.  (7)reviewed importance of follow up with primary care MD.  Care planning and goal setting for patient during home visit and primary concern is abdominal pain. This note and barrier letter routed to MD.  [facility] CM Care Plan Problem One     Most Recent Value  Care Plan Problem One  Recent admission for abdominal pain related to heart failure  Role Documenting the Problem One  Care Management [place] for Problem One  Active  [facility] Long Term Goal   Patient will report no readmissions for heart failure or complications from heart failure in the next 90 days.   [facility] Long Term Goal Start Date  12/08/18  Interventions for Problem One Long Term Goal  Home visit completed. reviewed heart failure zones. Reviewed options for constipation..  [facility] CM Short Term Goal #1   Patient will report  decreased abdominal pain in the next 14 days.  [facility] CM Short Term Goal #1 Start Date  12/08/18  Interventions for Short Term Goal #1  Reviewed abdominal pain. Encouraged patient to follow his low salt diet and increase fiber. Reviewed concern with patient that he needs to see his primary MD.   [facility] CM Short Term Goal #2   Patient will reports weighing and recording weights daily for the next 30 days.   [facility] CM Short Term Goal #2 Start Date  12/08/18  Interventions for Short Term Goal #2  Provided weight log and reviewed importance of recording every day.   [facility] CM Short Term Goal #3  Patient will report understanding options of taking medications at different times of the day to help decrease abdominal pain in the next 7 days.   [facility] CM Short Term Goal #3 Start Date  12/08/18  Interventions for Short Tern Goal #3  pharmacy refer placed and encouraged patient to discuss concerns with [place].     Tomasa Rand, RN, BSN, CEN [facility] [employer] [phone]

## 2018-12-08 NOTE — Patient Outreach (Signed)
12/06/2018: Late entry:  Placed call to patient and explained reason for call. Patient reports that he is having abdominal pain.  Reports that he believes it is because he takes all his pills at one time.  Reports weight is unchanged. Reports that he has follow up planned at the heart failure clinic but not an appointment with primary MD.  Patient reports that he does not need an appointment with primary MD that the heart failure clinic is managing all his care.    PLAN: Offered home visit for 12/08/2018 and patient has accepted. Request staff visiting him not to wear perfume.  Also will place an order for pharmacy to assist patient with an alternative times to take medications.  Care plan will be established at home visit on 12/08/2018.  Tomasa Rand, RN, BSN, CEN [facility] [employer] [phone]

## 2018-12-08 NOTE — Patient Outreach (Addendum)
[name]) Care Management  [place]   12/08/2018  [name] 1946/02/25 [id number]  Reason for referral: Medication Management  Referral source: [name] Current insurance:[employer]  PMHx includes but not limited to:   CHF, CAD,  CKD stage III, hypertension, A Fib, anemia   Outreach:  Successful telephone call with patient and wife.  HIPAA identifiers verified.   Subjective:  Patient is a 73 year old male with multiple medical conditions including but not limited to: CHF, CKD Stage III, hypertension, atrial fibrillation, anemia, cardiomyopathy and CAD. Patient was referred for medication review due to abdominal pain.  Patient was worried that taking all of his medications at one time was causing some of his stomach upset. Patient was recently hospitalized 12/03/18 for CHF exacerbation.   Objective: Lab Results  Component Value Date   CREATININE 1.95 (H) 12/03/2018   CREATININE 2.06 (H) 12/02/2018   CREATININE 1.93 (H) 12/01/2018    No results found for: HGBA1C  Lipid Panel     Component Value Date/Time   CHOL 169 09/13/2010 2055   TRIG 77 09/13/2010 2055   HDL 29 (L) 09/13/2010 2055   CHOLHDL 5.8 Ratio 09/13/2010 2055   VLDL 15 09/13/2010 2055   LDLCALC 125 (H) 09/13/2010 2055    BP Readings from Last 3 Encounters:  12/08/18 130/60  12/03/18 113/70  11/22/18 110/74    Allergies  Allergen Reactions  . Bentyl [Dicyclomine Hcl] Other (See Comments)    Blisters in mouth, lips swelling    . Sulfa Antibiotics Shortness Of Breath  . Ace Inhibitors     Unknown  . Corzide [Nadolol-Bendroflumethiazide]     Unknown  . Diovan [Valsartan]     Unknown  . Flagyl [Metronidazole] Other (See Comments)    Unknown  . Omeprazole     Shut my kidneys down  . Other     Perfumes smells; paint smells; formaldehyde smells  . Shellfish Allergy   . Tape Other (See Comments)  . Amlodipine Besylate Other (See Comments)    Numbness to  lips; made legs give out  . Codeine Itching and Rash    Itching  . Latex Itching and Rash  . Norvasc [Amlodipine Besylate] Other (See Comments)    Numbness to lips; made legs give out  . Penicillins Rash    Childhood reaction    Medications Reviewed Today    Reviewed by Elayne Guerin, [facility] (Pharmacist) on 12/08/18 at 1149  Med List Status: <None>  Medication Order Taking? Sig Documenting Provider Last Dose Status Informant  allopurinol (ZYLOPRIM) 100 MG tablet [id number] Yes Take 100 mg by mouth daily. [provider] Taking Active Self  amiodarone (PACERONE) 400 MG tablet [id number] Yes Take 1 tablet (400 mg total) by mouth every 8 (eight) hours. [name], NP Taking Active   apixaban (ELIQUIS) 5 MG TABS tablet [id number] Yes Take 1 tablet (5 mg total) by mouth 2 (two) times daily. [name], NP Taking Active         Discontinued 12/08/18 1149 (Patient Preference)   CALCIUM-MAGNESIUM-VITAMIN D PO [id number] Yes Take 1 tablet by mouth daily. [provider] Taking Active   diphenhydrAMINE (BENADRYL) 25 mg capsule [id number] Yes Take 25 mg by mouth every 6 (six) hours as needed for allergies.  [provider] Taking Active Self  fish oil-omega-3 fatty acids 1000 MG capsule [id number] Yes Take 2 g by mouth daily. [provider] Taking Active Self  hydrALAZINE (APRESOLINE)  25 MG tablet [id number] Yes Take 1 tablet (25 mg total) by mouth 3 (three) times daily. [name], NP Taking Active   isosorbide mononitrate (IMDUR) 30 MG 24 hr tablet [id number] Yes Take 1 tablet (30 mg total) by mouth daily. [name], NP Taking Active   loperamide (IMODIUM A-D) 2 MG tablet [id number] Yes Take 2 mg by mouth as needed for diarrhea or loose stools. [provider] Taking Active Self  magnesium oxide (MAG-OX) 400 MG tablet [id number] No Take 400 mg by mouth daily. [provider] Not Taking Active   milrinone (PRIMACOR) 20 MG/100 ML SOLN  infusion [id number] Yes Inject 0.0176 mg/min into the vein continuous. [name], NP Taking Active   montelukast (SINGULAIR) 10 MG tablet [id number] Yes Take 10 mg by mouth daily.  [provider] Taking Active Self  Multiple Vitamin (MULTIVITAMIN WITH MINERALS) TABS [id number] Yes Take 1 tablet by mouth daily. [provider] Taking Active Self  nitroGLYCERIN (NITROSTAT) 0.4 MG SL tablet [id number] Yes Place 0.4 mg under the tongue every 5 (five) minutes as needed for chest pain. (up to 3 doses) For chest pain Minus Breeding, MD Taking Active Self  omeprazole (PRILOSEC) 40 MG capsule [id number] Yes Take 1 capsule (40 mg total) by mouth every morning. [name], [name], [name] Taking Active Self  spironolactone (ALDACTONE) 25 MG tablet [id number] Yes Take 1 tablet (25 mg total) by mouth daily. [name], NP Taking Active   torsemide [facility]) 20 MG tablet [id number] Yes Take 2 tablets (40 mg total) by mouth daily. [name], NP Taking Active   traMADol Veatrice Bourbon) 50 MG tablet [id number] Yes Take 1 tablet by mouth every 6 hours as needed for pain.  Patient taking differently:  Take 50 mg by mouth every 6 (six) hours as needed for moderate pain.    [name], [name] Taking Active Self           Med Note August Luz Dec 08, 2018  9:33 AM) Old prescription from 2013.           Assessment:  ASSESSMENT: Date Discharged from Hospital: 12/03/2018 Date Medication Reconciliation Performed: 12/08/2018  Medications:   Medication Changes at discharge: START taking: amiodarone (PACERONE) apixaban (ELIQUIS) isosorbide mononitrate (IMDUR) milrinone (PRIMACOR) torsemide (DEMADEX)  CHANGE how you take: hydrALAZINE (APRESOLINE)  spironolactone (ALDACTONE)  STOP taking: bisoprolol 5 MG tablet (ZEBETA) carvedilol 20 MG 24 hr capsule (COREG CR) digoxin 0.125 MG tablet (LANOXIN) furosemide 40 MG tablet (LASIX) hyoscyamine 0.125 MG SL tablet (LEVSIN SL) warfarin  5 MG tablet (COUMADIN)  Patient was recently discharged from hospital and all medications have been reviewed.   Medications were reviewed with patient over the phone: Patient was recently discharged from hospital and all medications have been reviewed.   Drugs sorted by system:  Cardiovascular: Amiodarone, Eliquis, Hydralazine, Isosorbide Mono Nitrate, Milrinone,  Nitroglycerin, Spironolactone,Torsemide  Pulmonary/Allergy: Montelukast, Diphenhydramine,  Gastrointestinal: Lopreamide (OTC), Omeprazole,  Renal: Allopurinol  Pain: Tramadol  Vitamins/Minerals: Calcium/Mag/Vitamin D, Multiple Vitamin, Omega 3 Fatty Acids,    Medication Review Findings:  Patient complained of abdominal pain and wondered if taking all of his medications at one time could be the culprit. He was interested in decreasing his pill burden in the mornings.  The patient's abdominal pain could be multifactorial.  Abdominal pain was listed on his problem list at admission on 11/22/18 and he was not taking some of the medications he is taking now. In addition, the  patient shared that he was taking his morning medications on an empty stomach because he was taking omeprazole in the morning and the instructions said it should be taken thirty minutes before a meal.    To help decrease his morning pill burden, patient was encouraged to take the following medications at midday or in the evening:  -Isosorbide MN. Montelukast, Allopurinol, Calcium-Mag-Vitamin D  Omeprazole instructions state every morning but there is some potential to move this dose to before bedtime if deemed therapeutically appropriate by patient's provider.  Patient was also encouraged to take his medications around a meal. (This may help with abdominal pain.)  Omitted Medication: Magnesium Oxide-on list patient said he is not taking. Last Mag 2.0 (12/09/2018)  Potential to discontinue: Multiple Vitamin- has iron as one of the ingredients, may  be contributing to constipation and abdominal pain complaints.  Additional Medication Needed: Docusate Sodium OTC may be a good option to help with the patient's constipation.  Plan: Will route note to PCP and [facility] Nurse. , Will follow-up in 2-3 business days   Follow up on need for patient assistance at next phone call.  Elayne Guerin, PharmD, [place] Clinical Pharmacist [phone]

## 2018-12-08 NOTE — Telephone Encounter (Signed)
Amy [name] PA declines to send refill for Tramadol ( Ultram) 50 mg at this time.

## 2018-12-09 ENCOUNTER — Ambulatory Visit (HOSPITAL_COMMUNITY)
Admission: RE | Admit: 2018-12-09 | Discharge: 2018-12-09 | Disposition: A | Discharge status: Home / Self Care | Payer: PPO | Source: Ambulatory Visit | Attending: Cardiology | Admitting: Cardiology

## 2018-12-09 ENCOUNTER — Encounter (HOSPITAL_COMMUNITY): Payer: Self-pay

## 2018-12-09 ENCOUNTER — Other Ambulatory Visit: Payer: Self-pay

## 2018-12-09 VITALS — BP 130/62 | HR 99 | Wt 156.6 lb

## 2018-12-09 DIAGNOSIS — Z883 Allergy status to other anti-infective agents status: Secondary | ICD-10-CM | POA: Diagnosis not present

## 2018-12-09 DIAGNOSIS — I10 Essential (primary) hypertension: Secondary | ICD-10-CM

## 2018-12-09 DIAGNOSIS — I4892 Unspecified atrial flutter: Secondary | ICD-10-CM | POA: Diagnosis not present

## 2018-12-09 DIAGNOSIS — Z888 Allergy status to other drugs, medicaments and biological substances status: Secondary | ICD-10-CM | POA: Insufficient documentation

## 2018-12-09 DIAGNOSIS — Z955 Presence of coronary angioplasty implant and graft: Secondary | ICD-10-CM | POA: Diagnosis not present

## 2018-12-09 DIAGNOSIS — Z79899 Other long term (current) drug therapy: Secondary | ICD-10-CM | POA: Insufficient documentation

## 2018-12-09 DIAGNOSIS — Z95810 Presence of automatic (implantable) cardiac defibrillator: Secondary | ICD-10-CM | POA: Insufficient documentation

## 2018-12-09 DIAGNOSIS — Z7901 Long term (current) use of anticoagulants: Secondary | ICD-10-CM | POA: Insufficient documentation

## 2018-12-09 DIAGNOSIS — G40909 Epilepsy, unspecified, not intractable, without status epilepticus: Secondary | ICD-10-CM | POA: Insufficient documentation

## 2018-12-09 DIAGNOSIS — N179 Acute kidney failure, unspecified: Secondary | ICD-10-CM | POA: Diagnosis not present

## 2018-12-09 DIAGNOSIS — I5022 Chronic systolic (congestive) heart failure: Secondary | ICD-10-CM

## 2018-12-09 DIAGNOSIS — I2510 Atherosclerotic heart disease of native coronary artery without angina pectoris: Secondary | ICD-10-CM | POA: Diagnosis not present

## 2018-12-09 DIAGNOSIS — I5023 Acute on chronic systolic (congestive) heart failure: Secondary | ICD-10-CM | POA: Diagnosis not present

## 2018-12-09 DIAGNOSIS — I255 Ischemic cardiomyopathy: Secondary | ICD-10-CM

## 2018-12-09 DIAGNOSIS — Z91013 Allergy to seafood: Secondary | ICD-10-CM | POA: Diagnosis not present

## 2018-12-09 DIAGNOSIS — I447 Left bundle-branch block, unspecified: Secondary | ICD-10-CM | POA: Diagnosis not present

## 2018-12-09 DIAGNOSIS — Z882 Allergy status to sulfonamides status: Secondary | ICD-10-CM | POA: Insufficient documentation

## 2018-12-09 DIAGNOSIS — I130 Hypertensive heart and chronic kidney disease with heart failure and stage 1 through stage 4 chronic kidney disease, or unspecified chronic kidney disease: Secondary | ICD-10-CM | POA: Insufficient documentation

## 2018-12-09 DIAGNOSIS — K219 Gastro-esophageal reflux disease without esophagitis: Secondary | ICD-10-CM | POA: Diagnosis not present

## 2018-12-09 DIAGNOSIS — Z8249 Family history of ischemic heart disease and other diseases of the circulatory system: Secondary | ICD-10-CM | POA: Insufficient documentation

## 2018-12-09 DIAGNOSIS — J449 Chronic obstructive pulmonary disease, unspecified: Secondary | ICD-10-CM | POA: Insufficient documentation

## 2018-12-09 DIAGNOSIS — Z880 Allergy status to penicillin: Secondary | ICD-10-CM | POA: Insufficient documentation

## 2018-12-09 DIAGNOSIS — I4821 Permanent atrial fibrillation: Secondary | ICD-10-CM | POA: Diagnosis not present

## 2018-12-09 DIAGNOSIS — Z9861 Coronary angioplasty status: Secondary | ICD-10-CM

## 2018-12-09 DIAGNOSIS — Z91040 Latex allergy status: Secondary | ICD-10-CM | POA: Insufficient documentation

## 2018-12-09 DIAGNOSIS — E876 Hypokalemia: Secondary | ICD-10-CM | POA: Diagnosis not present

## 2018-12-09 DIAGNOSIS — I480 Paroxysmal atrial fibrillation: Secondary | ICD-10-CM

## 2018-12-09 DIAGNOSIS — N183 Chronic kidney disease, stage 3 unspecified: Secondary | ICD-10-CM

## 2018-12-09 DIAGNOSIS — Z881 Allergy status to other antibiotic agents status: Secondary | ICD-10-CM | POA: Diagnosis not present

## 2018-12-09 DIAGNOSIS — Z885 Allergy status to narcotic agent status: Secondary | ICD-10-CM | POA: Insufficient documentation

## 2018-12-09 LAB — CBC
HCT: 34 % — ABNORMAL LOW (ref 39.0–52.0)
Hemoglobin: 11.4 g/dL — ABNORMAL LOW (ref 13.0–17.0)
MCH: 32.5 pg (ref 26.0–34.0)
MCHC: 33.5 g/dL (ref 30.0–36.0)
MCV: 96.9 fL (ref 80.0–100.0)
Platelets: 326 10*3/uL (ref 150–400)
RBC: 3.51 MIL/uL — ABNORMAL LOW (ref 4.22–5.81)
RDW: 15.1 % (ref 11.5–15.5)
WBC: 6.8 10*3/uL (ref 4.0–10.5)
nRBC: 0 % (ref 0.0–0.2)

## 2018-12-09 LAB — BASIC METABOLIC PANEL
Anion gap: 11 (ref 5–15)
BUN: 31 mg/dL — ABNORMAL HIGH (ref 8–23)
CO2: 27 mmol/L (ref 22–32)
Calcium: 9.1 mg/dL (ref 8.9–10.3)
Chloride: 89 mmol/L — ABNORMAL LOW (ref 98–111)
Creatinine, Ser: 1.96 mg/dL — ABNORMAL HIGH (ref 0.61–1.24)
GFR calc Af Amer: 38 mL/min — ABNORMAL LOW (ref >60)
GFR calc non Af Amer: 33 mL/min — ABNORMAL LOW (ref >60)
Glucose, Bld: 134 mg/dL — ABNORMAL HIGH (ref 70–99)
POTASSIUM: 3.9 mmol/L (ref 3.5–5.1)
Sodium: 127 mmol/L — ABNORMAL LOW (ref 135–145)

## 2018-12-09 LAB — MAGNESIUM: Magnesium: 2.2 mg/dL (ref 1.7–2.4)

## 2018-12-09 LAB — COOXEMETRY PANEL
Carboxyhemoglobin: 1.2 % (ref 0.5–1.5)
Methemoglobin: 1.5 % (ref 0.0–1.5)
O2 Saturation: 53.4 %
Total hemoglobin: 12.6 g/dL (ref 12.0–16.0)

## 2018-12-09 LAB — BRAIN NATRIURETIC PEPTIDE: B Natriuretic Peptide: 526.3 pg/mL — ABNORMAL HIGH (ref 0.0–100.0)

## 2018-12-09 MED ORDER — TRAMADOL HCL 50 MG PO TABS: 50.0000 mg | ORAL_TABLET | Freq: Two times a day (BID) | ORAL | 0 refills | Status: DC | PRN

## 2018-12-09 MED ORDER — ONDANSETRON 4 MG PO TBDP: 4.0000 mg | ORAL_TABLET | Freq: Four times a day (QID) | ORAL | 0 refills | Status: AC | PRN

## 2018-12-09 MED ORDER — TRAMADOL HCL 50 MG PO TABS: 50.0000 mg | ORAL_TABLET | Freq: Two times a day (BID) | ORAL | 0 refills | Status: AC | PRN

## 2018-12-09 MED ORDER — AMIODARONE HCL 200 MG PO TABS: 200.0000 mg | ORAL_TABLET | Freq: Two times a day (BID) | ORAL | 2 refills | Status: AC

## 2018-12-09 NOTE — Patient Instructions (Addendum)
Labs today We will only contact you if something comes back abnormal or we need to make some changes. Otherwise no news is good news!  CHANGE Amiodarone to 200mg  (1 tab) twice a day  Zofran 4mg  1 tab every 6 hours as needed for nausea  Your physician recommends that you schedule a follow-up appointment in: 3 weeks in Np/PA clinic and 6-8 weeks with Dr. Haroldine Laws

## 2018-12-09 NOTE — Progress Notes (Signed)
Advanced Heart Failure Clinic Note   Referring Physician: PCP: [name], MD PCP-Cardiologist: No primary care provider on file.   HPI:  [name] is a 73 y.o. male with h/o CAD(s/p DES to LCx '03, residual total occlusion of RCA w/ collaterals, normal myoview 2012),ICM s/p Medtronic BiV ICD (upgrade in 2017), h/o Afib/flutter, HTN, HLD, CKD, and seizure disorder.   Admitted from [facility] clinic on 11/22/18 with concerns for low output. Echo completed outpatient showed reduced EF to 15% from 35-40% in 2017. HF team was consulted. PICC line was placed. Diuresed with IV lasix. Coox was low at 50% so he was started on milrinone 0.25 on 1/7 with improvement. Milrinone slowly weaned to off on 1/10. He originally did well. EP was consulted with only 90% BiV pacing. He was started on amio 400 mg TID to help decrease AF rate and increase BiV function. He was switched from coumadin to Eliquis post cath.   Underwent R/LHC on 1/13, which showed mild nonobstructive CAD, NICM with EF 10%, moderately elevated left sided filling pressures, and moderately decreased CO as below. He had AKI and coox dropped again off milrinone. So restarted and tunneled line placed for home.  He was made partial code, with no CPR, only intubation if isolated resp distress, and for 1 week only. Medications provided through [place]. His first month of milrinone will be covered by HF fund. [facility] and HF CSW working on getting his copays waived through [place].  He presents today for post hospital follow up. Remains on home milrinone. Feeling overall poorly. His appetite has worsened and he has been constipated. Had mild relief this am with stool softeners over the past several days. He has been nauseated as well. He denies chest pain. He has had intermittent abdominal pain, but overall this seems improved from prior to his admission. He denies SOB with ADLs, and has not been any more active than that. He verbalizes  understanding that home inotrope support is his "last option" with refusal of further advanced therapies, and is very open to hospice. He wants to try milrinone a little longer.  Review of systems complete and found to be negative unless listed in HPI.    Past Medical History:  Diagnosis Date  . 6949-lead    replaced 02/2011  . Acute on chronic renal insufficiency   . AICD (automatic cardioverter/defibrillator) present    Medtronic CRT  . Allergy   . Asthma   . Atrial flutter ([place])    s/p TEE guided cardioversion  . CHF (congestive heart failure) ([place])   . Chronic drug-induced interstitial lung disorders ([place])   . COPD (chronic obstructive pulmonary disease) ([place])    " very MILD "  . Coronary artery disease    total occlusion of the right coronary artery, left to right collaterals.  He had Cypher stenting to the circumflex in  July 2003  . Family hx of colon cancer    2 SISTERS  . Fatty liver    CT  . GERD (gastroesophageal reflux disease)   . Headache(784.0)   . Hyperlipemia   . Hypertension   . Hyponatremia   . Ischemic cardiomyopathy    Myoview 02/2011  EF 28%  Coronary artery disease  (total occlusion of the right coronary artery, left-to-right )  . LBBB (left bundle branch block)   . Nonsustained ventricular tachycardia ([place])   . Seizure disorder [facility])     Current Outpatient Medications  Medication Sig Dispense Refill  .  allopurinol (ZYLOPRIM) 100 MG tablet Take 100 mg by mouth daily.    Marland Kitchen amiodarone (PACERONE) 400 MG tablet Take 1 tablet (400 mg total) by mouth every 8 (eight) hours. 90 tablet 5  . apixaban (ELIQUIS) 5 MG TABS tablet Take 1 tablet (5 mg total) by mouth 2 (two) times daily. 60 tablet 5  . CALCIUM-MAGNESIUM-VITAMIN D PO Take 1 tablet by mouth daily.    . diphenhydrAMINE (BENADRYL) 25 mg capsule Take 25 mg by mouth every 6 (six) hours as needed for allergies.     . fish oil-omega-3 fatty acids 1000 MG capsule Take 2 g by mouth daily.    . hydrALAZINE  (APRESOLINE) 25 MG tablet Take 1 tablet (25 mg total) by mouth 3 (three) times daily. 90 tablet 5  . isosorbide mononitrate (IMDUR) 30 MG 24 hr tablet Take 1 tablet (30 mg total) by mouth daily. 30 tablet 5  . loperamide (IMODIUM A-D) 2 MG tablet Take 2 mg by mouth as needed for diarrhea or loose stools.    . milrinone (PRIMACOR) 20 MG/100 ML SOLN infusion Inject 0.0176 mg/min into the vein continuous. 100 mL 11  . montelukast (SINGULAIR) 10 MG tablet Take 10 mg by mouth daily.   3  . Multiple Vitamin (MULTIVITAMIN WITH MINERALS) TABS Take 1 tablet by mouth daily.    Marland Kitchen omeprazole (PRILOSEC) 40 MG capsule Take 1 capsule (40 mg total) by mouth every morning. 30 capsule 2  . spironolactone (ALDACTONE) 25 MG tablet Take 1 tablet (25 mg total) by mouth daily. 45 tablet 6  . torsemide (DEMADEX) 20 MG tablet Take 2 tablets (40 mg total) by mouth daily. 60 tablet 5  . traMADol (ULTRAM) 50 MG tablet Take 1 tablet by mouth every 6 hours as needed for pain. (Patient taking differently: Take 50 mg by mouth every 6 (six) hours as needed for moderate pain. ) 30 tablet 0  . magnesium oxide (MAG-OX) 400 MG tablet Take 400 mg by mouth daily.    . nitroGLYCERIN (NITROSTAT) 0.4 MG SL tablet Place 0.4 mg under the tongue every 5 (five) minutes as needed for chest pain. (up to 3 doses) For chest pain     No current facility-administered medications for this encounter.     Allergies  Allergen Reactions  . Bentyl [Dicyclomine Hcl] Other (See Comments)    Blisters in mouth, lips swelling    . Sulfa Antibiotics Shortness Of Breath  . Ace Inhibitors     Unknown  . Corzide [Nadolol-Bendroflumethiazide]     Unknown  . Diovan [Valsartan]     Unknown  . Flagyl [Metronidazole] Other (See Comments)    Unknown  . Omeprazole     Shut my kidneys down  . Other     Perfumes smells; paint smells; formaldehyde smells  . Shellfish Allergy   . Tape Other (See Comments)  . Amlodipine Besylate Other (See Comments)     Numbness to lips; made legs give out  . Codeine Itching and Rash    Itching  . Latex Itching and Rash  . Norvasc [Amlodipine Besylate] Other (See Comments)    Numbness to lips; made legs give out  . Penicillins Rash    Childhood reaction      Social History   Socioeconomic History  . Marital status: Married    Spouse name: Not on file  . Number of children: 1  . Years of education: Not on file  . Highest education level: Not on file  Occupational History  .  Occupation: RETIRED    Employer: RETIRED  Social Needs  . Financial resource strain: Not on file  . Food insecurity:    Worry: Not on file    Inability: Not on file  . Transportation needs:    Medical: Not on file    Non-medical: Not on file  Tobacco Use  . Smoking status: Never Smoker  . Smokeless tobacco: Never Used  Substance and Sexual Activity  . Alcohol use: Yes    Comment: OCCASIONALLY NOT OFTEN  . Drug use: No  . Sexual activity: Not on file  Lifestyle  . Physical activity:    Days per week: Not on file    Minutes per session: Not on file  . Stress: Not on file  Relationships  . Social connections:    Talks on phone: Not on file    Gets together: Not on file    Attends religious service: Not on file    Active member of club or organization: Not on file    Attends meetings of clubs or organizations: Not on file    Relationship status: Not on file  . Intimate partner violence:    Fear of current or ex partner: Not on file    Emotionally abused: Not on file    Physically abused: Not on file    Forced sexual activity: Not on file  Other Topics Concern  . Not on file  Social History Narrative  . Not on file      Family History  Problem Relation Age of Onset  . Colon cancer Sister        BOTH SISTERS 1 DESEASED FROM COLON CANCER  . Colon cancer Sister   . Heart disease Mother   . Heart disease Father     Vitals:   12/09/18 1442  BP: 130/62  Pulse: 99  SpO2: 98%  Weight: 71 kg (156 lb  9.6 oz)   Wt Readings from Last 3 Encounters:  12/09/18 71 kg (156 lb 9.6 oz)  12/08/18 67.9 kg (149 lb 9.6 oz)  12/03/18 70.5 kg (155 lb 6.4 oz)    PHYSICAL EXAM: General:  Chronically ill appearing. NAD HEENT: normal Neck: supple. no JVD. Carotids 2+ bilat; no bruits. No lymphadenopathy or thyromegaly appreciated. Cor: PMI nondisplaced. IRR, + S3.  Lungs: Slightly diminished basilar sounds.  Abdomen: soft, nontender, nondistended. No hepatosplenomegaly. No bruits or masses. Good bowel sounds. Extremities: no cyanosis, clubbing, or rash. Trace ankle edema at most.  Neuro: alert & oriented x 3, cranial nerves grossly intact. moves all 4 extremities w/o difficulty. Affect pleasant.  ASSESSMENT & PLAN:  1. Acute on chronic systolic CHF s/p BiV Medtronic ICD with low output - Echo 11/19/2018 EF down to 15%, previously 35-40%. NYHA III.  - Continue milrinone 0.25 mcg/kg/min. Will pay through HF fund for first month. Hopefully [facility] can help waive his copays. Check coox today.  - NYHA III symptoms, confounded by deconditioning. - Volume status looks OK on exam.   - Continue torsemide 40 mg daily for now. BMET today.  - Continue hydralazine 25 mg TID and Imdur 30 daily.  - Lisinopril stopped in 2018 due to AKI. Has ARBs listed as allergy. So no ACE/ARB/ARNI  - Continue spiro 25 mg daily. - Not on digoxin with worsening creatinine.  - Not a candidate for transplant and would not want LVAD.  - Dr Caryl Comes saw regarding PVCs and AF, thought to be contributing to worsening HF. See discussion below.  - No BB  with low output.   2. CAD s/p PCI - No s/s of ischemia.    -CFX PCI in 2003, CTO of RCA with collaterals. -Myoview April 2012-no ischemia,inferolateral scar - LHC 1/13 showed mild non obstructive CAD.   3.Permanent A-fib - Chronic by ICD interrogation 11/23/2018 - BiV paced ~ 90% of the time by interrogation on recent admission. ICD interrogation not available today.  - Continue  Eliquis. $9/month per CM note.  - EP saw recent admit.  - Decrease amiodarone to 200 mg BID to see if this helps with his nausea and appetite.  - No BB with low output.  - Pt is not interested in AVN.   4. AKI on CKD III  - Baseline Unclear. Has ranged from 1.4 - 1.9 in the past several years.  - Likely due to cardiorenal syndrome  - Recent Cr 2.14 from home. BMET today.   5. Seizure Disorder - Reported in chart. This is distant. He has not had a seizure since his 20-30s. He is not on medication. Reports having several "grand-mal" seizures with no clear etiology in his youth. No change.   6. Hypokalemia - BMET today.   7. Goals of Care - We had very long discussion today. He is now DNR/DNI. MOST form completed. He does NOT want transport to the hospital, but wants to die in comfort at home. Low threshold to progress to full hospice involvement.  - He does NOT want LVAD consideration.  - ICD tachy therapies now off.   Long discussion concerning goals of care as above. Pt in agreement. If labs considerably worsened today, will proceed with full hospice care. If stable, or marginal, will continue milrinone for at least another week or two with following labs and symptoms. Provided Zofran and Tramadol for comfort. Suspect that patients prognosis at this point is less than 6 months, likely less, but cannot proceed with full hospice support while on milrinone.   RTC 3 weeks. If symptoms worsen sooner, patients plan is to proceed with hospice and comfort care, which we are in full agreement with.  Labs today.   Shirley Friar, [name] 12/09/18   Greater than 50% of the 45 minute visit was spent in counseling/coordination of care regarding disease state education, salt/fluid restriction, sliding scale diuretics, and medication compliance.

## 2018-12-09 NOTE — Patient Outreach (Signed)
Telephone follow up:  Placed call to patient who reports he was able to get his bowels to move some and feels some better. Reports that he went to is heart failure appointment today and was told that his abdominal pain is from his heart failure. Patient reports that he was given a prescription for pain medications and nausea medications.   PLAN: will follow up in 1 week. Encouraged patient to report any changes in condition to MD.  Tomasa Rand, RN, BSN, CEN [place] Coordinator [phone]

## 2018-12-10 ENCOUNTER — Other Ambulatory Visit: Payer: Self-pay | Admitting: Pharmacist

## 2018-12-10 ENCOUNTER — Other Ambulatory Visit (HOSPITAL_COMMUNITY): Payer: Self-pay | Admitting: Internal Medicine

## 2018-12-10 ENCOUNTER — Telehealth (HOSPITAL_COMMUNITY): Payer: Self-pay | Admitting: *Deleted

## 2018-12-10 DIAGNOSIS — Q447 Other congenital malformations of liver: Secondary | ICD-10-CM | POA: Diagnosis not present

## 2018-12-10 DIAGNOSIS — I5022 Chronic systolic (congestive) heart failure: Secondary | ICD-10-CM | POA: Diagnosis not present

## 2018-12-10 DIAGNOSIS — N179 Acute kidney failure, unspecified: Secondary | ICD-10-CM | POA: Diagnosis not present

## 2018-12-10 DIAGNOSIS — I509 Heart failure, unspecified: Secondary | ICD-10-CM | POA: Diagnosis not present

## 2018-12-10 NOTE — Telephone Encounter (Signed)
Dr.Monguilod called stating he received a referral for patient but patient is not in his servicing area. Patient is actually in the area that is serviced by [facility] [place]. He wants to know if we can refer patient to [facility] [place]. Per Caryl Pina referral to [facility] [place] placed.   [place] call back #[phone]

## 2018-12-10 NOTE — Patient Outreach (Signed)
[facility]) Care Management  12/10/2018  POLK MINOR 14-Jul-1946 [id number]   Patient was called to follow up on his abdominal pain and to assess for medication assistance. HIPAA identifiers were obtained. Patient confirmed his abdominal pain was "a little better" after switching some of his tablet administration times. He had a post hospital visit on 12/09/18. Amiodarone dose was decreased from 400 mg twice daily to 200 mg twice daily. High dose Amiodarone is associated with abdominal pain. A reduction in the dosage should be helpful. Patient was also prescribed ondansetron for nausea during the visit.  Plan: Close patient's pharmacy case. Route note to Tomasa Rand, RN   Elayne Guerin, PharmD, [place] Clinical Pharmacist [phone]

## 2018-12-13 ENCOUNTER — Encounter: Payer: Self-pay | Admitting: Internal Medicine

## 2018-12-13 DIAGNOSIS — N179 Acute kidney failure, unspecified: Secondary | ICD-10-CM | POA: Diagnosis not present

## 2018-12-13 DIAGNOSIS — Z5181 Encounter for therapeutic drug level monitoring: Secondary | ICD-10-CM | POA: Diagnosis not present

## 2018-12-13 DIAGNOSIS — Q447 Other congenital malformations of liver: Secondary | ICD-10-CM | POA: Diagnosis not present

## 2018-12-13 DIAGNOSIS — I5022 Chronic systolic (congestive) heart failure: Secondary | ICD-10-CM | POA: Diagnosis not present

## 2018-12-13 DIAGNOSIS — I509 Heart failure, unspecified: Secondary | ICD-10-CM | POA: Diagnosis not present

## 2018-12-14 ENCOUNTER — Other Ambulatory Visit: Payer: Self-pay

## 2018-12-14 NOTE — Patient Outreach (Signed)
Transition of care:  Placed call to patient and spoke with wife. Mrs Heatherly reports patient is some better. Reports that he is still hurting in abdomen and back. Reports bowels are moving better. Reports no changes in weight. States that patient is trying to eat better.  Wife reports that she needs a smaller shower chair and she has called [facility].   PLAN: encouraged patient to discuss shower chair concerns with nurse who comes 3 times per week. Also call [facility] back. If wife continues to struggle with shower chair concerns to please call me back and I would see what I could do to help. She voiced understanding. Will plan next transition of care call next week.  Tomasa Rand, RN, BSN, CEN [facility] [employer] [phone]

## 2018-12-17 ENCOUNTER — Ambulatory Visit (INDEPENDENT_AMBULATORY_CARE_PROVIDER_SITE_OTHER): Payer: PPO

## 2018-12-17 DIAGNOSIS — Q447 Other congenital malformations of liver: Secondary | ICD-10-CM | POA: Diagnosis not present

## 2018-12-17 DIAGNOSIS — I5022 Chronic systolic (congestive) heart failure: Secondary | ICD-10-CM | POA: Diagnosis not present

## 2018-12-17 DIAGNOSIS — N179 Acute kidney failure, unspecified: Secondary | ICD-10-CM | POA: Diagnosis not present

## 2018-12-17 DIAGNOSIS — I255 Ischemic cardiomyopathy: Secondary | ICD-10-CM

## 2018-12-17 DIAGNOSIS — I509 Heart failure, unspecified: Secondary | ICD-10-CM | POA: Diagnosis not present

## 2018-12-17 LAB — CUP PACEART REMOTE DEVICE CHECK
Battery Voltage: 2.96 V
Brady Statistic AP VP Percent: 0 %
Brady Statistic AP VS Percent: 0 %
Brady Statistic AS VP Percent: 74.38 %
Brady Statistic AS VS Percent: 25.62 %
Brady Statistic RA Percent Paced: 0 %
Brady Statistic RV Percent Paced: 75.15 %
Date Time Interrogation Session: 20200131184252
HighPow Impedance: 48 Ohm
HighPow Impedance: 61 Ohm
Implantable Lead Implant Date: 20071010
Implantable Lead Implant Date: 20071010
Implantable Lead Implant Date: 20120419
Implantable Lead Location: 753858
Implantable Lead Location: 753859
Implantable Lead Location: 753860
Implantable Lead Model: 4194
Implantable Lead Model: 5076
Implantable Lead Model: 7121
Implantable Pulse Generator Implant Date: 20170419
Lead Channel Impedance Value: 361 Ohm
Lead Channel Impedance Value: 361 Ohm
Lead Channel Impedance Value: 361 Ohm
Lead Channel Impedance Value: 475 Ohm
Lead Channel Impedance Value: 551 Ohm
Lead Channel Impedance Value: 817 Ohm
Lead Channel Pacing Threshold Amplitude: 0.75 V
Lead Channel Pacing Threshold Amplitude: 0.875 V
Lead Channel Pacing Threshold Pulse Width: 0.4 ms
Lead Channel Pacing Threshold Pulse Width: 0.4 ms
Lead Channel Sensing Intrinsic Amplitude: 13.125 mV
Lead Channel Sensing Intrinsic Amplitude: 13.125 mV
Lead Channel Sensing Intrinsic Amplitude: 4.5 mV
Lead Channel Sensing Intrinsic Amplitude: 4.5 mV
Lead Channel Setting Pacing Amplitude: 2 V
Lead Channel Setting Pacing Amplitude: 2.5 V
Lead Channel Setting Pacing Pulse Width: 0.4 ms
Lead Channel Setting Sensing Sensitivity: 0.3 mV
MDC IDC MSMT BATTERY REMAINING LONGEVITY: 43 mo
MDC IDC SET LEADCHNL LV PACING PULSEWIDTH: 0.4 ms

## 2018-12-20 DIAGNOSIS — Q447 Other congenital malformations of liver: Secondary | ICD-10-CM | POA: Diagnosis not present

## 2018-12-20 DIAGNOSIS — I509 Heart failure, unspecified: Secondary | ICD-10-CM | POA: Diagnosis not present

## 2018-12-20 DIAGNOSIS — N179 Acute kidney failure, unspecified: Secondary | ICD-10-CM | POA: Diagnosis not present

## 2018-12-20 DIAGNOSIS — I5022 Chronic systolic (congestive) heart failure: Secondary | ICD-10-CM | POA: Diagnosis not present

## 2018-12-21 ENCOUNTER — Other Ambulatory Visit: Payer: Self-pay

## 2018-12-21 NOTE — Patient Outreach (Signed)
Transition of care:  Today's Vitals   12/21/18 1045  Weight: 150 lb 3.2 oz (68.1 kg)    Placed call to patient who reports decreased abdominal pain. Reports bowels are moving daily. Reports that he is trying to eat better.  Reports that he still has been unable to shower chair to fit his shower. Reports son is unable to get to [place] to return the wrong sized shower chair.  PLAN: place call to [facility] home [facility] to inquire about delivery of appropriate sized shower chair. Spoke with representative at [facility] who reports they do not pick up equipment for non fitting equipment.  Reports patient and or family would need to bring back to the store and exchange. Tisa with [facility] reports that she will ask patients nurse to return my call.  Home health nurses name is Levada Dy. Will continue transition of care weekly. Reviewed heart failure zones with patient.  Encouraged good nutrition and limited salt intake.   Tomasa Rand, RN, BSN, CEN [facility] [employer] [phone]

## 2018-12-21 NOTE — Patient Outreach (Signed)
Care coordination:  Placed call back to patient and provided update. Patient reports that he is not able to private pay for any other equipment and states that he just wants someone to come pick up the shower chair and return it.    Awaiting a call back from patients home health nurse.   Tomasa Rand, RN, BSN, CEN [facility] [employer] [phone]

## 2018-12-22 LAB — CUP PACEART REMOTE DEVICE CHECK
Battery Remaining Longevity: 43 mo
Battery Voltage: 2.95 V
Brady Statistic AP VP Percent: 0 %
Brady Statistic AP VS Percent: 0 %
Brady Statistic AS VP Percent: 85.57 %
Brady Statistic AS VS Percent: 14.43 %
Brady Statistic RA Percent Paced: 0 %
Date Time Interrogation Session: 20200202001206
HighPow Impedance: 49 Ohm
HighPow Impedance: 62 Ohm
Implantable Lead Implant Date: 20071010
Implantable Lead Implant Date: 20071010
Implantable Lead Implant Date: 20120419
Implantable Lead Location: 753858
Implantable Lead Location: 753859
Implantable Lead Location: 753860
Implantable Lead Model: 4194
Implantable Lead Model: 7121
Implantable Pulse Generator Implant Date: 20170419
Lead Channel Impedance Value: 361 Ohm
Lead Channel Impedance Value: 361 Ohm
Lead Channel Impedance Value: 361 Ohm
Lead Channel Impedance Value: 456 Ohm
Lead Channel Impedance Value: 551 Ohm
Lead Channel Impedance Value: 779 Ohm
Lead Channel Pacing Threshold Amplitude: 0.625 V
Lead Channel Pacing Threshold Amplitude: 0.875 V
Lead Channel Pacing Threshold Pulse Width: 0.4 ms
Lead Channel Pacing Threshold Pulse Width: 0.4 ms
Lead Channel Sensing Intrinsic Amplitude: 15.375 mV
Lead Channel Sensing Intrinsic Amplitude: 15.375 mV
Lead Channel Sensing Intrinsic Amplitude: 4.375 mV
Lead Channel Sensing Intrinsic Amplitude: 4.375 mV
Lead Channel Setting Pacing Amplitude: 2 V
Lead Channel Setting Pacing Amplitude: 2.5 V
Lead Channel Setting Pacing Pulse Width: 0.4 ms
Lead Channel Setting Sensing Sensitivity: 0.3 mV
MDC IDC SET LEADCHNL LV PACING PULSEWIDTH: 0.4 ms
MDC IDC STAT BRADY RV PERCENT PACED: 88.65 %

## 2018-12-24 DIAGNOSIS — I509 Heart failure, unspecified: Secondary | ICD-10-CM | POA: Diagnosis not present

## 2018-12-24 DIAGNOSIS — N179 Acute kidney failure, unspecified: Secondary | ICD-10-CM | POA: Diagnosis not present

## 2018-12-24 DIAGNOSIS — Q447 Other congenital malformations of liver: Secondary | ICD-10-CM | POA: Diagnosis not present

## 2018-12-24 DIAGNOSIS — I5022 Chronic systolic (congestive) heart failure: Secondary | ICD-10-CM | POA: Diagnosis not present

## 2018-12-27 ENCOUNTER — Telehealth: Payer: Self-pay | Admitting: Cardiology

## 2018-12-27 ENCOUNTER — Other Ambulatory Visit (HOSPITAL_COMMUNITY): Payer: Self-pay | Admitting: Student

## 2018-12-27 ENCOUNTER — Encounter: Payer: Self-pay | Admitting: Cardiology

## 2018-12-27 DIAGNOSIS — I5023 Acute on chronic systolic (congestive) heart failure: Secondary | ICD-10-CM | POA: Diagnosis not present

## 2018-12-27 DIAGNOSIS — I300 Acute nonspecific idiopathic pericarditis: Secondary | ICD-10-CM | POA: Diagnosis not present

## 2018-12-27 DIAGNOSIS — Q447 Other congenital malformations of liver: Secondary | ICD-10-CM | POA: Diagnosis not present

## 2018-12-27 DIAGNOSIS — I509 Heart failure, unspecified: Secondary | ICD-10-CM | POA: Diagnosis not present

## 2018-12-27 DIAGNOSIS — I5022 Chronic systolic (congestive) heart failure: Secondary | ICD-10-CM | POA: Diagnosis not present

## 2018-12-27 DIAGNOSIS — N179 Acute kidney failure, unspecified: Secondary | ICD-10-CM | POA: Diagnosis not present

## 2018-12-27 NOTE — Telephone Encounter (Signed)
SPOKE TO PATIENT . INFORMED HIM TO KEEP APPOINTMENT CHF CLINIC AND WE WOULD CANCEL APPT WITH DR [facility]

## 2018-12-27 NOTE — Telephone Encounter (Signed)
Refills should be sent to PCP 

## 2018-12-27 NOTE — Telephone Encounter (Signed)
PHONE BUSY -  PATIENT DOES NOT NEED  AN APPOINTMENT WITH DR [place] ON 12/29/18.  PATIENT IS SEEING  HEART FAILURE CLINIC ON 12/30/18

## 2018-12-27 NOTE — Progress Notes (Signed)
Remote ICD transmission.   

## 2018-12-27 NOTE — Telephone Encounter (Signed)
New Message   PT is calling because he is not sure if he needs to keep his scheduled appts with [name] because he is also scheduled to see Dr Hayden Pedro per the hospital. Please call

## 2018-12-28 ENCOUNTER — Other Ambulatory Visit: Payer: Self-pay

## 2018-12-28 NOTE — Patient Outreach (Signed)
Transition of care:  Placed call to patient and spoke with wife who reports that patient is feeling better. Reports decrease in abdominal pain and reports bowels are moving well.  Wife states that patient is eating well. Weight down to 148.8 pounds.  PLAN: patient has follow up planned with heart failure clinic this week. Will continue to follow for transition of care. Remains active with advanced home [facility] and continues milrinone drip at home.  Tomasa Rand, RN, BSN, CEN [facility] [employer] [phone]

## 2018-12-29 ENCOUNTER — Ambulatory Visit: Payer: PPO | Admitting: Cardiology

## 2018-12-30 ENCOUNTER — Ambulatory Visit (HOSPITAL_COMMUNITY)
Admission: RE | Admit: 2018-12-30 | Discharge: 2018-12-30 | Disposition: A | Discharge status: Home / Self Care | Payer: PPO | Source: Ambulatory Visit | Attending: Cardiology | Admitting: Cardiology

## 2018-12-30 ENCOUNTER — Other Ambulatory Visit: Payer: Self-pay

## 2018-12-30 VITALS — BP 134/76 | HR 87 | Wt 154.5 lb

## 2018-12-30 DIAGNOSIS — I447 Left bundle-branch block, unspecified: Secondary | ICD-10-CM | POA: Insufficient documentation

## 2018-12-30 DIAGNOSIS — G40909 Epilepsy, unspecified, not intractable, without status epilepticus: Secondary | ICD-10-CM | POA: Insufficient documentation

## 2018-12-30 DIAGNOSIS — K219 Gastro-esophageal reflux disease without esophagitis: Secondary | ICD-10-CM | POA: Insufficient documentation

## 2018-12-30 DIAGNOSIS — I4892 Unspecified atrial flutter: Secondary | ICD-10-CM | POA: Insufficient documentation

## 2018-12-30 DIAGNOSIS — Z955 Presence of coronary angioplasty implant and graft: Secondary | ICD-10-CM | POA: Diagnosis not present

## 2018-12-30 DIAGNOSIS — J449 Chronic obstructive pulmonary disease, unspecified: Secondary | ICD-10-CM | POA: Insufficient documentation

## 2018-12-30 DIAGNOSIS — Z9861 Coronary angioplasty status: Secondary | ICD-10-CM

## 2018-12-30 DIAGNOSIS — Z881 Allergy status to other antibiotic agents status: Secondary | ICD-10-CM | POA: Insufficient documentation

## 2018-12-30 DIAGNOSIS — I5023 Acute on chronic systolic (congestive) heart failure: Secondary | ICD-10-CM | POA: Diagnosis not present

## 2018-12-30 DIAGNOSIS — Z880 Allergy status to penicillin: Secondary | ICD-10-CM | POA: Diagnosis not present

## 2018-12-30 DIAGNOSIS — I255 Ischemic cardiomyopathy: Secondary | ICD-10-CM | POA: Diagnosis not present

## 2018-12-30 DIAGNOSIS — Z79899 Other long term (current) drug therapy: Secondary | ICD-10-CM | POA: Insufficient documentation

## 2018-12-30 DIAGNOSIS — I2510 Atherosclerotic heart disease of native coronary artery without angina pectoris: Secondary | ICD-10-CM

## 2018-12-30 DIAGNOSIS — Z91040 Latex allergy status: Secondary | ICD-10-CM | POA: Insufficient documentation

## 2018-12-30 DIAGNOSIS — Z885 Allergy status to narcotic agent status: Secondary | ICD-10-CM | POA: Diagnosis not present

## 2018-12-30 DIAGNOSIS — Z882 Allergy status to sulfonamides status: Secondary | ICD-10-CM | POA: Diagnosis not present

## 2018-12-30 DIAGNOSIS — Z888 Allergy status to other drugs, medicaments and biological substances status: Secondary | ICD-10-CM | POA: Insufficient documentation

## 2018-12-30 DIAGNOSIS — Z95810 Presence of automatic (implantable) cardiac defibrillator: Secondary | ICD-10-CM

## 2018-12-30 DIAGNOSIS — I5022 Chronic systolic (congestive) heart failure: Secondary | ICD-10-CM | POA: Diagnosis not present

## 2018-12-30 DIAGNOSIS — Z91013 Allergy to seafood: Secondary | ICD-10-CM | POA: Insufficient documentation

## 2018-12-30 DIAGNOSIS — Z800 Family history of malignant neoplasm of digestive organs: Secondary | ICD-10-CM | POA: Insufficient documentation

## 2018-12-30 DIAGNOSIS — N183 Chronic kidney disease, stage 3 unspecified: Secondary | ICD-10-CM

## 2018-12-30 DIAGNOSIS — I130 Hypertensive heart and chronic kidney disease with heart failure and stage 1 through stage 4 chronic kidney disease, or unspecified chronic kidney disease: Secondary | ICD-10-CM | POA: Insufficient documentation

## 2018-12-30 DIAGNOSIS — Z91048 Other nonmedicinal substance allergy status: Secondary | ICD-10-CM | POA: Insufficient documentation

## 2018-12-30 DIAGNOSIS — N179 Acute kidney failure, unspecified: Secondary | ICD-10-CM | POA: Diagnosis not present

## 2018-12-30 DIAGNOSIS — Z7901 Long term (current) use of anticoagulants: Secondary | ICD-10-CM | POA: Diagnosis not present

## 2018-12-30 DIAGNOSIS — I4821 Permanent atrial fibrillation: Secondary | ICD-10-CM

## 2018-12-30 DIAGNOSIS — E876 Hypokalemia: Secondary | ICD-10-CM | POA: Diagnosis not present

## 2018-12-30 NOTE — Patient Instructions (Signed)
Please keep appointment April 16th 2020 at 11:20am

## 2018-12-30 NOTE — Progress Notes (Addendum)
Advanced Heart Failure Clinic Note   Referring Physician: PCP: [name], MD PCP-Cardiologist: No primary care provider on file.   HPI:  [name] is a 73 y.o. male with h/o CAD(s/p DES to LCx '03, residual total occlusion of RCA w/ collaterals, normal myoview 2012),ICM s/p Medtronic BiV ICD (upgrade in 2017), h/o Afib/flutter, HTN, HLD, CKD, and seizure disorder.   Admitted from [facility] clinic on 11/22/18 with concerns for low output. Echo completed outpatient showed reduced EF to 15% from 35-40% in 2017. HF team was consulted. PICC line was placed. Diuresed with IV lasix. Coox was low at 50% so he was started on milrinone 0.25 on 1/7 with improvement. Milrinone slowly weaned to off on 1/10. He originally did well. EP was consulted with only 90% BiV pacing. He was started on amio 400 mg TID to help decrease AF rate and increase BiV function. He was switched from coumadin to Eliquis post cath.   Underwent R/LHC on 1/13, which showed mild nonobstructive CAD, NICM with EF 10%, moderately elevated left sided filling pressures, and moderately decreased CO as below. He had AKI and coox dropped again off milrinone. So restarted and tunneled line placed for home.  He was made partial code, with no CPR, only intubation if isolated resp distress, and for 1 week only. Medications provided through [place]. His first month of milrinone will be covered by HF fund. [facility] and HF CSW working on getting his copays waived through [place].  He presents today for regular follow up. Coox 53.4% at that visit. Amiodarone decreased with possible side effects. He is feeling much better today. His appetite and energy level have both improved. He denies SOB or CP. He has been doing his ADLs without difficulty. He is frustrated he hasn't been able to gain any weight. Denies peripheral edema. He had some "protruding veins" on his legs this am, but it has improved with compression hose. He is tolerating  inotrope support better. He does not want to consider any other therapies. He cannot afford to pay for milrinone on his own.   ICD Interrogation: Optivol: Thoracic impedence nearly even with baseline, but slightly below. Fluid index with very mild uptrend. Pt active for 4-5 hrs daily. Personally reviewed.   Review of systems complete and found to be negative unless listed in HPI.    Past Medical History:  Diagnosis Date  . 6949-lead    replaced 02/2011  . Acute on chronic renal insufficiency   . AICD (automatic cardioverter/defibrillator) present    Medtronic CRT  . Allergy   . Asthma   . Atrial flutter ([place])    s/p TEE guided cardioversion  . CHF (congestive heart failure) ([place])   . Chronic drug-induced interstitial lung disorders ([place])   . COPD (chronic obstructive pulmonary disease) ([place])    " very MILD "  . Coronary artery disease    total occlusion of the right coronary artery, left to right collaterals.  He had Cypher stenting to the circumflex in  July 2003  . Family hx of colon cancer    2 SISTERS  . Fatty liver    CT  . GERD (gastroesophageal reflux disease)   . Headache(784.0)   . Hyperlipemia   . Hypertension   . Hyponatremia   . Ischemic cardiomyopathy    Myoview 02/2011  EF 28%  Coronary artery disease  (total occlusion of the right coronary artery, left-to-right )  . LBBB (left bundle branch block)   .  Nonsustained ventricular tachycardia ([place])   . Seizure disorder [facility])     Current Outpatient Medications  Medication Sig Dispense Refill  . allopurinol (ZYLOPRIM) 100 MG tablet Take 100 mg by mouth daily.    Marland Kitchen amiodarone (PACERONE) 200 MG tablet Take 1 tablet (200 mg total) by mouth 2 (two) times daily. 60 tablet 2  . apixaban (ELIQUIS) 5 MG TABS tablet Take 1 tablet (5 mg total) by mouth 2 (two) times daily. 60 tablet 5  . CALCIUM-MAGNESIUM-VITAMIN D PO Take 1 tablet by mouth daily.    . diphenhydrAMINE (BENADRYL) 25 mg capsule Take 25 mg by mouth every 6 (six)  hours as needed for allergies.     . fish oil-omega-3 fatty acids 1000 MG capsule Take 2 g by mouth daily.    . hydrALAZINE (APRESOLINE) 25 MG tablet Take 1 tablet (25 mg total) by mouth 3 (three) times daily. 90 tablet 5  . isosorbide mononitrate (IMDUR) 30 MG 24 hr tablet Take 1 tablet (30 mg total) by mouth daily. 30 tablet 5  . loperamide (IMODIUM A-D) 2 MG tablet Take 2 mg by mouth as needed for diarrhea or loose stools.    . magnesium oxide (MAG-OX) 400 MG tablet Take 400 mg by mouth daily.    . milrinone (PRIMACOR) 20 MG/100 ML SOLN infusion Inject 0.0176 mg/min into the vein continuous. 100 mL 11  . montelukast (SINGULAIR) 10 MG tablet Take 10 mg by mouth daily.   3  . Multiple Vitamin (MULTIVITAMIN WITH MINERALS) TABS Take 1 tablet by mouth daily.    . nitroGLYCERIN (NITROSTAT) 0.4 MG SL tablet Place 0.4 mg under the tongue every 5 (five) minutes as needed for chest pain. (up to 3 doses) For chest pain    . omeprazole (PRILOSEC) 40 MG capsule Take 1 capsule (40 mg total) by mouth every morning. 30 capsule 2  . ondansetron (ZOFRAN ODT) 4 MG disintegrating tablet Take 1 tablet (4 mg total) by mouth every 6 (six) hours as needed for nausea or vomiting. 60 tablet 0  . spironolactone (ALDACTONE) 25 MG tablet Take 1 tablet (25 mg total) by mouth daily. 45 tablet 6  . torsemide (DEMADEX) 20 MG tablet Take 2 tablets (40 mg total) by mouth daily. 60 tablet 5  . traMADol (ULTRAM) 50 MG tablet Take 1 tablet (50 mg total) by mouth every 12 (twelve) hours as needed for moderate pain or severe pain. 30 tablet 0   No current facility-administered medications for this encounter.     Allergies  Allergen Reactions  . Bentyl [Dicyclomine Hcl] Other (See Comments)    Blisters in mouth, lips swelling    . Sulfa Antibiotics Shortness Of Breath  . Ace Inhibitors     Unknown  . Corzide [Nadolol-Bendroflumethiazide]     Unknown  . Diovan [Valsartan]     Unknown  . Flagyl [Metronidazole] Other (See  Comments)    Unknown  . Omeprazole     Shut my kidneys down  . Other     Perfumes smells; paint smells; formaldehyde smells  . Shellfish Allergy   . Tape Other (See Comments)  . Amlodipine Besylate Other (See Comments)    Numbness to lips; made legs give out  . Codeine Itching and Rash    Itching  . Latex Itching and Rash  . Norvasc [Amlodipine Besylate] Other (See Comments)    Numbness to lips; made legs give out  . Penicillins Rash    Childhood reaction   Social History  Socioeconomic History  . Marital status: Married    Spouse name: Not on file  . Number of children: 1  . Years of education: Not on file  . Highest education level: Not on file  Occupational History  . Occupation: RETIRED    Employer: RETIRED  Social Needs  . Financial resource strain: Not on file  . Food insecurity:    Worry: Not on file    Inability: Not on file  . Transportation needs:    Medical: Not on file    Non-medical: Not on file  Tobacco Use  . Smoking status: Never Smoker  . Smokeless tobacco: Never Used  Substance and Sexual Activity  . Alcohol use: Yes    Comment: OCCASIONALLY NOT OFTEN  . Drug use: No  . Sexual activity: Not on file  Lifestyle  . Physical activity:    Days per week: Not on file    Minutes per session: Not on file  . Stress: Not on file  Relationships  . Social connections:    Talks on phone: Not on file    Gets together: Not on file    Attends religious service: Not on file    Active member of club or organization: Not on file    Attends meetings of clubs or organizations: Not on file    Relationship status: Not on file  . Intimate partner violence:    Fear of current or ex partner: Not on file    Emotionally abused: Not on file    Physically abused: Not on file    Forced sexual activity: Not on file  Other Topics Concern  . Not on file  Social History Narrative  . Not on file   Family History  Problem Relation Age of Onset  . Colon cancer Sister         BOTH SISTERS 1 DESEASED FROM COLON CANCER  . Colon cancer Sister   . Heart disease Mother   . Heart disease Father    Vitals:   12/30/18 1359  BP: 134/76  Pulse: 87  SpO2: 99%  Weight: 70.1 kg (154 lb 8 oz)     Wt Readings from Last 3 Encounters:  12/30/18 70.1 kg (154 lb 8 oz)  12/28/18 67.5 kg (148 lb 12.8 oz)  12/21/18 68.1 kg (150 lb 3.2 oz)    PHYSICAL EXAM: General: Chronically ill appearing. NAD.  HEENT: Normal Neck: Supple. JVP 6-7 cm. Carotids 2+ bilat; no bruits. No thyromegaly or nodule noted. Cor: PMI nondisplaced. IRR, +S3.  Lungs: CTAB, normal effort. Abdomen: Soft, non-tender, non-distended, no HSM. No bruits or masses. +BS  Extremities: No cyanosis, clubbing, or rash. R and LLE no edema.  Neuro: Alert & orientedx3, cranial nerves grossly intact. moves all 4 extremities w/o difficulty. Affect pleasant   ASSESSMENT & PLAN:  1. Acute on chronic systolic CHF s/p BiV Medtronic ICD with low output - Echo 11/19/2018 EF down to 15%, previously 35-40%. NYHA III.  - Continue milrinone 0.25 mcg/kg/min.  - NYHA II-III currently, much improved from NYHA IV on his initial presentation.  - Volume status looks stable on exam.    - Continue torsemide 40 mg daily for now. Had labs earlier this week via [facility]. Will await.   - Continue hydralazine 25 mg TID and Imdur 30 daily. BPs running 100-110s at home.  - Lisinopril stopped in 2018 due to AKI. Has ARBs listed as allergy. So no ACE/ARB/ARNI.  - Continue spiro 25 mg daily. - Not on  digoxin with worsening creatinine.  - Not a candidate for transplant and would not want LVAD.  - Dr Caryl Comes saw regarding PVCs and AF, thought to be contributing to worsening HF. See discussion below.  - No BB with low output.   2. CAD s/p PCI - No s/s of ischemia.    -CFX PCI in 2003, CTO of RCA with collaterals. -Myoview April 2012-no ischemia,inferolateral scar - LHC 1/13 showed mild non obstructive CAD.   3.Permanent A-fib -  Chronic by ICD interrogation 11/23/2018 - BiV paced ~90% or more of the time by interrogation.  - Continue Eliquis. $9/month per CM note.  - Continue amiodarone 200 mg BID for now.  - No BB with low output.  - Pt is not interested in AVN ablation.   4. AKI on CKD III  - Baseline Unclear. Has ranged from 1.4 - 1.9 in the past several years.  - Likely due to cardiorenal syndrome  - Most recent lab in system 1.96. - Had labs earlier this week via [facility]. Will await.   5. Seizure Disorder - Reported in chart. This is distant. He has not had a seizure since his 20-30s. He is not on medication. Reports having several "grand-mal" seizures with no clear etiology in his youth. No change.   6. Hypokalemia - Had labs earlier this week via [facility]. Will await.     7. Goals of Care - He is doing well overall. Remains DNR/DNI. If he worsens, wants progression to full hospice. He seems to be improving somewhat.  - ICD tachy therapies now off.   Doing well overall. Prefers to stay on his same medicines as he is gradually feeling better. Had labs drawn at [facility] visit earlier this week. Will await. RTC 6-8 weeks. Sooner with symptoms. Spoke with Carolynn Sayers of [facility] personally. Plan to cover his milrinone under "charity". She will let us know right away if this can no longer be done, as patient would want to stop milrinone at that time based on cost.   Addendum: Labs received from [facility]. Cr stable at 1.8.K 4.4. CBC unremarkable. Hgb 12.0  Shirley Friar, [place] 12/30/18   Greater than 50% of the 25 minute visit was spent in counseling/coordination of care regarding disease state education, salt/fluid restriction, sliding scale diuretics, and medication compliance.

## 2018-12-31 DIAGNOSIS — I509 Heart failure, unspecified: Secondary | ICD-10-CM | POA: Diagnosis not present

## 2018-12-31 DIAGNOSIS — Q447 Other congenital malformations of liver: Secondary | ICD-10-CM | POA: Diagnosis not present

## 2018-12-31 DIAGNOSIS — N179 Acute kidney failure, unspecified: Secondary | ICD-10-CM | POA: Diagnosis not present

## 2018-12-31 DIAGNOSIS — I5022 Chronic systolic (congestive) heart failure: Secondary | ICD-10-CM | POA: Diagnosis not present

## 2018-12-31 NOTE — Addendum Note (Signed)
Encounter addended by: Shirley Friar, [name] on: 12/31/2018 11:21 AM  Actions taken: Clinical Note Signed

## 2019-01-03 DIAGNOSIS — I5022 Chronic systolic (congestive) heart failure: Secondary | ICD-10-CM | POA: Diagnosis not present

## 2019-01-03 DIAGNOSIS — I509 Heart failure, unspecified: Secondary | ICD-10-CM | POA: Diagnosis not present

## 2019-01-03 DIAGNOSIS — Q441 Other congenital malformations of gallbladder: Secondary | ICD-10-CM | POA: Diagnosis not present

## 2019-01-03 DIAGNOSIS — N179 Acute kidney failure, unspecified: Secondary | ICD-10-CM | POA: Diagnosis not present

## 2019-01-05 ENCOUNTER — Ambulatory Visit: Payer: Self-pay

## 2019-01-06 ENCOUNTER — Other Ambulatory Visit: Payer: Self-pay

## 2019-01-06 DIAGNOSIS — N179 Acute kidney failure, unspecified: Secondary | ICD-10-CM | POA: Diagnosis not present

## 2019-01-06 DIAGNOSIS — I5022 Chronic systolic (congestive) heart failure: Secondary | ICD-10-CM | POA: Diagnosis not present

## 2019-01-06 DIAGNOSIS — Q447 Other congenital malformations of liver: Secondary | ICD-10-CM | POA: Diagnosis not present

## 2019-01-06 DIAGNOSIS — I509 Heart failure, unspecified: Secondary | ICD-10-CM | POA: Diagnosis not present

## 2019-01-06 NOTE — Patient Outreach (Signed)
Transition of care:  Placed call to patient, and spoke with wife who states patient is doing very well. Reports home health comes once a week and came today. Reports weight continues to fall and patient wants to gain weight. Wife reports a good appetite.  Wife states patient remains active and is in good spirits.  PLAN: will follow up in 1 month. Encouraged wife to call me sooner if needed. Patient has successfully completed transition of care program without a readmission.  Tomasa Rand, RN, BSN, CEN [facility] [employer] [phone]

## 2019-01-07 ENCOUNTER — Other Ambulatory Visit (HOSPITAL_COMMUNITY): Payer: Self-pay | Admitting: Internal Medicine

## 2019-01-07 DIAGNOSIS — I509 Heart failure, unspecified: Secondary | ICD-10-CM | POA: Diagnosis not present

## 2019-01-07 DIAGNOSIS — I5022 Chronic systolic (congestive) heart failure: Secondary | ICD-10-CM | POA: Diagnosis not present

## 2019-01-07 DIAGNOSIS — Q447 Other congenital malformations of liver: Secondary | ICD-10-CM | POA: Diagnosis not present

## 2019-01-07 DIAGNOSIS — N179 Acute kidney failure, unspecified: Secondary | ICD-10-CM | POA: Diagnosis not present

## 2019-01-10 ENCOUNTER — Telehealth (HOSPITAL_COMMUNITY): Payer: Self-pay | Admitting: *Deleted

## 2019-01-10 NOTE — Telephone Encounter (Signed)
Palliative care nurse called stating pt was wanting to take something to help him gain weight and she wanted to know if protein shakes were ok for him.  Advised he can take ensure/boost he just has to count those into his fluid for the day, she will advise pt.  She also states pt is wanting to know how long he is covered for his milrinone.  Per chart [facility] is covering med as "charity" and will do so until they are not able to any more at which point they will notify us so we can make a decision, advised there is no set time frame, she will advise pt

## 2019-01-13 DIAGNOSIS — I130 Hypertensive heart and chronic kidney disease with heart failure and stage 1 through stage 4 chronic kidney disease, or unspecified chronic kidney disease: Secondary | ICD-10-CM | POA: Diagnosis not present

## 2019-01-13 DIAGNOSIS — Q447 Other congenital malformations of liver: Secondary | ICD-10-CM | POA: Diagnosis not present

## 2019-01-13 DIAGNOSIS — I509 Heart failure, unspecified: Secondary | ICD-10-CM | POA: Diagnosis not present

## 2019-01-13 DIAGNOSIS — N179 Acute kidney failure, unspecified: Secondary | ICD-10-CM | POA: Diagnosis not present

## 2019-01-13 DIAGNOSIS — I5022 Chronic systolic (congestive) heart failure: Secondary | ICD-10-CM | POA: Diagnosis not present

## 2019-01-13 DIAGNOSIS — I5023 Acute on chronic systolic (congestive) heart failure: Secondary | ICD-10-CM | POA: Diagnosis not present

## 2019-01-14 DIAGNOSIS — Q441 Other congenital malformations of gallbladder: Secondary | ICD-10-CM | POA: Diagnosis not present

## 2019-01-14 DIAGNOSIS — N179 Acute kidney failure, unspecified: Secondary | ICD-10-CM | POA: Diagnosis not present

## 2019-01-14 DIAGNOSIS — I509 Heart failure, unspecified: Secondary | ICD-10-CM | POA: Diagnosis not present

## 2019-01-20 DIAGNOSIS — K760 Fatty (change of) liver, not elsewhere classified: Secondary | ICD-10-CM | POA: Diagnosis not present

## 2019-01-20 DIAGNOSIS — J449 Chronic obstructive pulmonary disease, unspecified: Secondary | ICD-10-CM | POA: Diagnosis not present

## 2019-01-20 DIAGNOSIS — I4821 Permanent atrial fibrillation: Secondary | ICD-10-CM | POA: Diagnosis not present

## 2019-01-20 DIAGNOSIS — I5023 Acute on chronic systolic (congestive) heart failure: Secondary | ICD-10-CM | POA: Diagnosis not present

## 2019-01-20 DIAGNOSIS — Z452 Encounter for adjustment and management of vascular access device: Secondary | ICD-10-CM | POA: Diagnosis not present

## 2019-01-20 DIAGNOSIS — I255 Ischemic cardiomyopathy: Secondary | ICD-10-CM | POA: Diagnosis not present

## 2019-01-20 DIAGNOSIS — Q447 Other congenital malformations of liver: Secondary | ICD-10-CM | POA: Diagnosis not present

## 2019-01-20 DIAGNOSIS — G40909 Epilepsy, unspecified, not intractable, without status epilepticus: Secondary | ICD-10-CM | POA: Diagnosis not present

## 2019-01-20 DIAGNOSIS — N179 Acute kidney failure, unspecified: Secondary | ICD-10-CM | POA: Diagnosis not present

## 2019-01-20 DIAGNOSIS — I4892 Unspecified atrial flutter: Secondary | ICD-10-CM | POA: Diagnosis not present

## 2019-01-20 DIAGNOSIS — Z95810 Presence of automatic (implantable) cardiac defibrillator: Secondary | ICD-10-CM | POA: Diagnosis not present

## 2019-01-20 DIAGNOSIS — I509 Heart failure, unspecified: Secondary | ICD-10-CM | POA: Diagnosis not present

## 2019-01-20 DIAGNOSIS — I130 Hypertensive heart and chronic kidney disease with heart failure and stage 1 through stage 4 chronic kidney disease, or unspecified chronic kidney disease: Secondary | ICD-10-CM | POA: Diagnosis not present

## 2019-01-20 DIAGNOSIS — I5022 Chronic systolic (congestive) heart failure: Secondary | ICD-10-CM | POA: Diagnosis not present

## 2019-01-20 DIAGNOSIS — N183 Chronic kidney disease, stage 3 (moderate): Secondary | ICD-10-CM | POA: Diagnosis not present

## 2019-01-20 DIAGNOSIS — I447 Left bundle-branch block, unspecified: Secondary | ICD-10-CM | POA: Diagnosis not present

## 2019-01-20 DIAGNOSIS — Z79899 Other long term (current) drug therapy: Secondary | ICD-10-CM | POA: Diagnosis not present

## 2019-01-20 DIAGNOSIS — I2510 Atherosclerotic heart disease of native coronary artery without angina pectoris: Secondary | ICD-10-CM | POA: Diagnosis not present

## 2019-01-20 DIAGNOSIS — Z7901 Long term (current) use of anticoagulants: Secondary | ICD-10-CM | POA: Diagnosis not present

## 2019-01-20 DIAGNOSIS — Z9181 History of falling: Secondary | ICD-10-CM | POA: Diagnosis not present

## 2019-01-21 ENCOUNTER — Other Ambulatory Visit (HOSPITAL_COMMUNITY): Payer: Self-pay | Admitting: Internal Medicine

## 2019-01-27 DIAGNOSIS — I130 Hypertensive heart and chronic kidney disease with heart failure and stage 1 through stage 4 chronic kidney disease, or unspecified chronic kidney disease: Secondary | ICD-10-CM | POA: Diagnosis not present

## 2019-01-27 DIAGNOSIS — Q447 Other congenital malformations of liver: Secondary | ICD-10-CM | POA: Diagnosis not present

## 2019-01-27 DIAGNOSIS — N179 Acute kidney failure, unspecified: Secondary | ICD-10-CM | POA: Diagnosis not present

## 2019-01-27 DIAGNOSIS — I509 Heart failure, unspecified: Secondary | ICD-10-CM | POA: Diagnosis not present

## 2019-02-02 ENCOUNTER — Other Ambulatory Visit (HOSPITAL_COMMUNITY): Payer: Self-pay | Admitting: Internal Medicine

## 2019-02-02 DIAGNOSIS — I130 Hypertensive heart and chronic kidney disease with heart failure and stage 1 through stage 4 chronic kidney disease, or unspecified chronic kidney disease: Secondary | ICD-10-CM | POA: Diagnosis not present

## 2019-02-03 ENCOUNTER — Telehealth (HOSPITAL_COMMUNITY): Payer: Self-pay

## 2019-02-03 DIAGNOSIS — I130 Hypertensive heart and chronic kidney disease with heart failure and stage 1 through stage 4 chronic kidney disease, or unspecified chronic kidney disease: Secondary | ICD-10-CM | POA: Diagnosis not present

## 2019-02-03 DIAGNOSIS — I509 Heart failure, unspecified: Secondary | ICD-10-CM | POA: Diagnosis not present

## 2019-02-03 DIAGNOSIS — N179 Acute kidney failure, unspecified: Secondary | ICD-10-CM | POA: Diagnosis not present

## 2019-02-03 DIAGNOSIS — Q447 Other congenital malformations of liver: Secondary | ICD-10-CM | POA: Diagnosis not present

## 2019-02-03 NOTE — Telephone Encounter (Signed)
Levada Dy, [facility] nurse called to report change in pt, stated increased abd pain, increased weakness, 102/70 bp 96 HR, non pitting BLE edema, weight same, dry hacking cough. Per Jonni Sanger, [place] ordered PRN Bmet & BNP and consider hospice.

## 2019-02-04 ENCOUNTER — Other Ambulatory Visit: Payer: Self-pay

## 2019-02-04 NOTE — Patient Outreach (Signed)
[facility] Telephone note: Incoming call from wife trying to reach the "right nurse" to help with getting PICC line removed and set up for hospice. Wife reports increased pain and unable to sleep. Reports patient wants PICC line removed and transferred to hospice.  Wife reports home health working on this but husband wants PICC line removed.  I could hear husband in background frustrated about wanting PICC line out.  I explained MD would need to order to have drip discontinued and line removed.  Wife voiced understanding and hung up.  PLAN: Will attempt to reach home health nurse.  Tomasa Rand, RN, BSN, CEN [facility] [employer] [phone]

## 2019-02-04 NOTE — Patient Outreach (Signed)
Care Coordination:  Placed call to [facility] [employer]:  Inquired about home health nurse assigned.  Levada Dy RN returned call.  Provided update from phone call with wife today. Levada Dy states she has heard from hospice who is going to assess patient today.    PLAN: Will await a call back from home health RN.  Tomasa Rand, RN, BSN, CEN [facility] [employer] [phone]

## 2019-02-07 ENCOUNTER — Other Ambulatory Visit: Payer: Self-pay

## 2019-02-07 NOTE — Patient Outreach (Signed)
Telephone assessment: Case closure:  Placed call to patient and spoke with wife , who reports patient was accepted by hospice of [place] on 02/04/2019.  Wife reports home health nurse coming to remove PICC line today. Wife reports increased pain and not eating well in the last 2 weeks.   PLAN: wished patient well. Will close case as patient is active with hospice.   Tomasa Rand, RN, BSN, CEN [facility] [employer] [phone]

## 2019-02-16 DEATH — deceased

## 2019-02-21 ENCOUNTER — Encounter (HOSPITAL_COMMUNITY): Payer: PPO | Admitting: Internal Medicine

## 2019-02-22 ENCOUNTER — Telehealth: Payer: Self-pay | Admitting: Cardiology

## 2019-02-22 ENCOUNTER — Other Ambulatory Visit (HOSPITAL_COMMUNITY): Payer: Self-pay | Admitting: Internal Medicine

## 2019-02-22 ENCOUNTER — Encounter (HOSPITAL_COMMUNITY): Payer: PPO | Admitting: Internal Medicine

## 2019-02-22 NOTE — Telephone Encounter (Signed)
New Message    Pts wife is calling to inform [name] of the Pts passing on 03/12/19  She wanted to thank [name] for the many years of him helping them

## 2019-02-22 NOTE — Telephone Encounter (Signed)
Update fwd to Dr. Percival Spanish

## 2019-03-01 ENCOUNTER — Ambulatory Visit: Payer: PPO | Admitting: Cardiology

## 2019-03-03 ENCOUNTER — Encounter (HOSPITAL_COMMUNITY): Payer: PPO | Admitting: Internal Medicine

## 2020-07-08 IMAGING — DX DG CHEST 1V PORT
1 series · 1 of 1 positions shown · non-contrast
Comparison: Portable exam 2620 hours compared to 09/17/2012

CLINICAL DATA: PICC line placement

EXAM:
PORTABLE CHEST 1 VIEW

[chest]
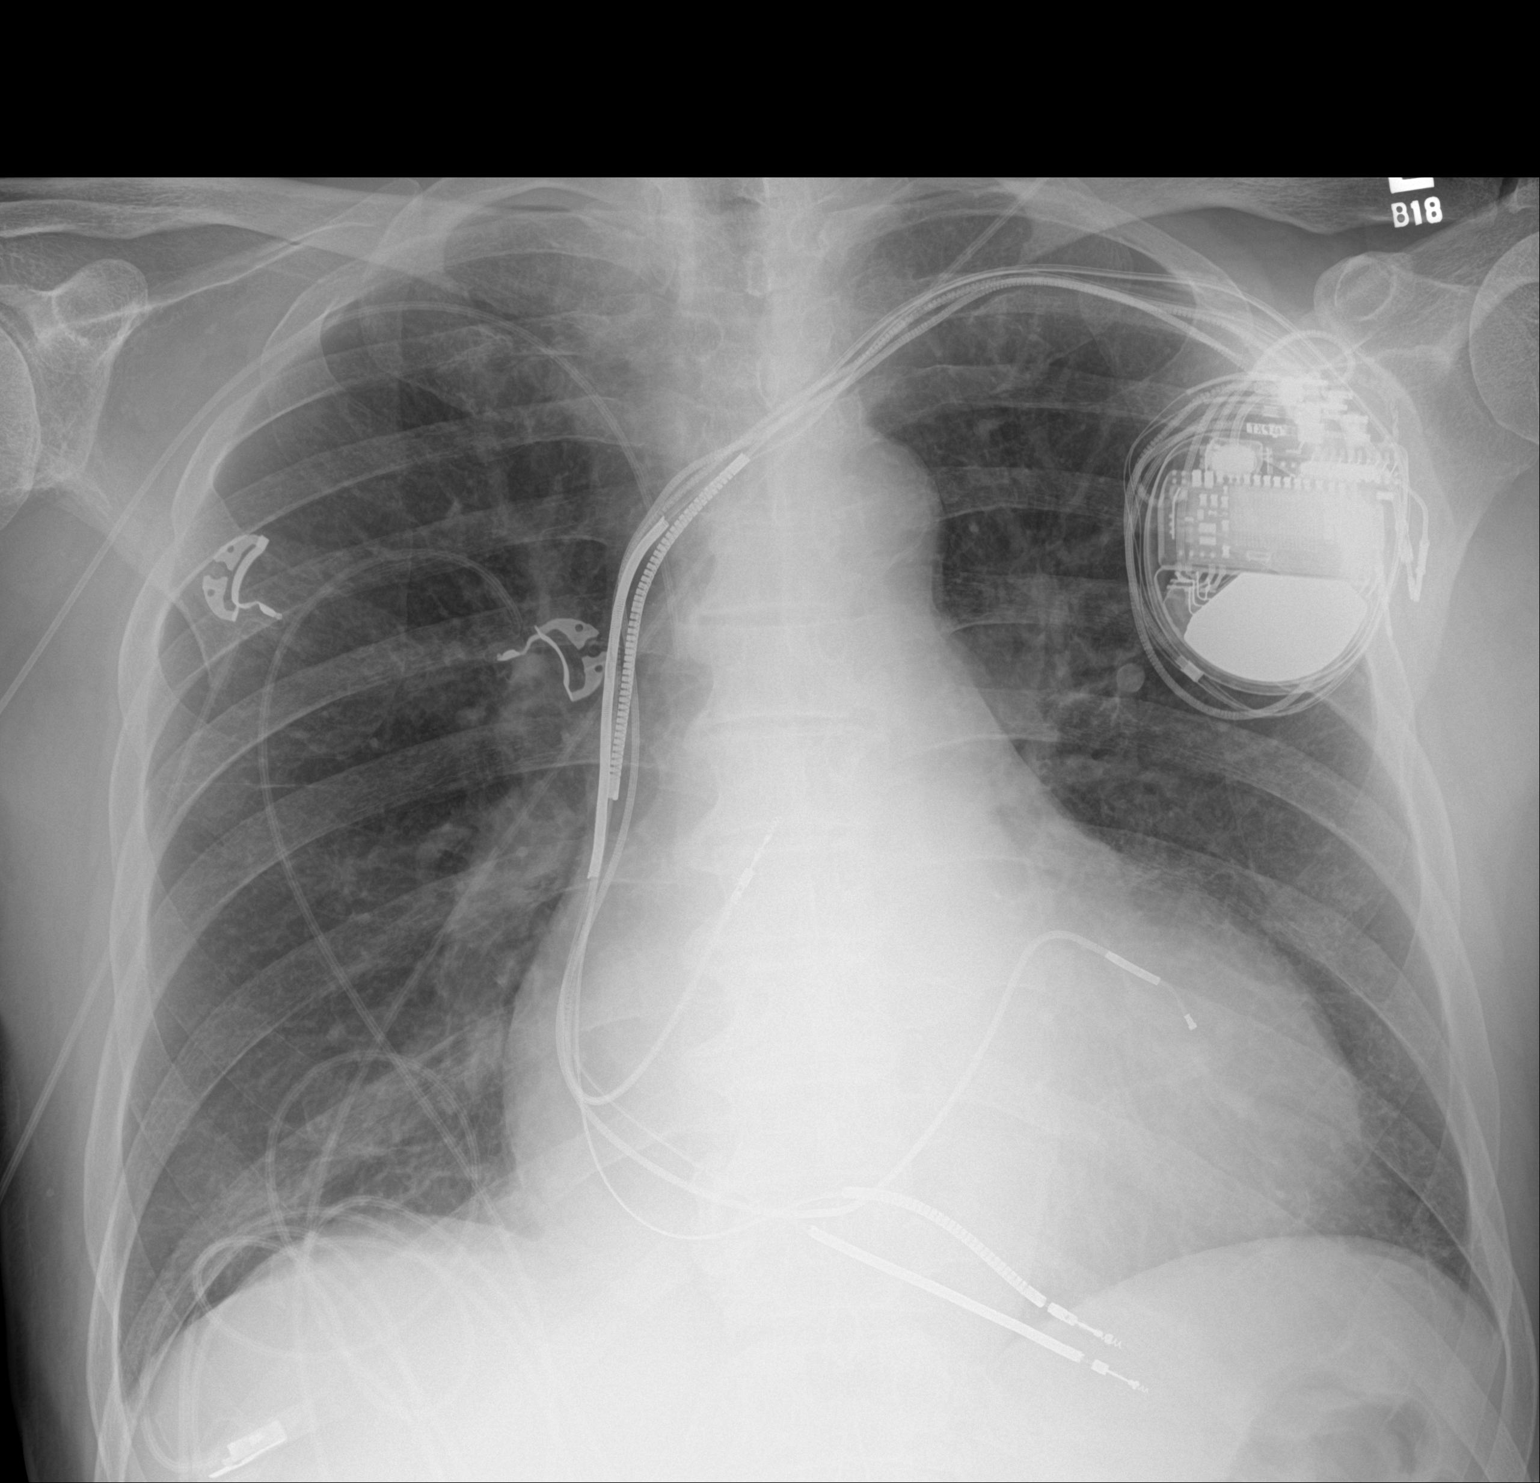

[1 of 1 positions shown; findings below may reference images not displayed]

FINDINGS: RIGHT arm PICC line tip projects over SVC at the level of the
carina.

LEFT subclavian ICD leads project over RIGHT atrium, RIGHT ventricle
and coronary sinus.

Enlargement of cardiac silhouette with pulmonary vascular
congestion.

Atherosclerotic calcification aorta.

Minimal subsegmental atelectasis at lung bases.

Lungs otherwise clear.

No pleural effusion or pneumothorax.
IMPRESSION: Tip of RIGHT arm PICC line projects over proximal SVC.

Enlargement of cardiac silhouette post ICD.

Minimal bibasilar atelectasis.
